# Patient Record
Sex: Female | Born: 1980 | Hispanic: No | Marital: Single | Smoking: Never smoker
Health system: Southern US, Community
[De-identification: ages and names within clinical notes are randomized; demographics above are authoritative.]

## PROBLEM LIST (undated history)

## (undated) ENCOUNTER — Inpatient Hospital Stay (HOSPITAL_COMMUNITY): Payer: Self-pay

## (undated) ENCOUNTER — Emergency Department (HOSPITAL_COMMUNITY): Admission: EM | Payer: Medicaid Other

## (undated) ENCOUNTER — Emergency Department (HOSPITAL_COMMUNITY): Admission: EM | Payer: Self-pay | Source: Home / Self Care

## (undated) ENCOUNTER — Ambulatory Visit (HOSPITAL_COMMUNITY): Disposition: A | Discharge status: Home / Self Care | Payer: Medicaid Other

## (undated) DIAGNOSIS — K219 Gastro-esophageal reflux disease without esophagitis: Secondary | ICD-10-CM

## (undated) DIAGNOSIS — N90810 Female genital mutilation status, unspecified: Secondary | ICD-10-CM

## (undated) DIAGNOSIS — K649 Unspecified hemorrhoids: Secondary | ICD-10-CM

## (undated) DIAGNOSIS — E7800 Pure hypercholesterolemia, unspecified: Secondary | ICD-10-CM

## (undated) DIAGNOSIS — Z9289 Personal history of other medical treatment: Secondary | ICD-10-CM

## (undated) HISTORY — DX: Unspecified hemorrhoids: K64.9

## (undated) HISTORY — DX: Personal history of other medical treatment: Z92.89

## (undated) HISTORY — DX: Female genital mutilation status, unspecified: N90.810

## (undated) HISTORY — PX: CYSTOTOMY: SHX926

---

## 2008-11-29 ENCOUNTER — Emergency Department (HOSPITAL_COMMUNITY): Admission: EM | Admit: 2008-11-29 | Discharge: 2008-11-29 | Payer: Self-pay | Admitting: Emergency Medicine

## 2009-04-05 ENCOUNTER — Emergency Department (HOSPITAL_COMMUNITY): Admission: EM | Admit: 2009-04-05 | Discharge: 2009-04-06 | Payer: Self-pay | Admitting: Emergency Medicine

## 2009-04-10 ENCOUNTER — Emergency Department (HOSPITAL_COMMUNITY): Admission: EM | Admit: 2009-04-10 | Discharge: 2009-04-10 | Payer: Self-pay | Admitting: Emergency Medicine

## 2009-05-19 ENCOUNTER — Emergency Department (HOSPITAL_COMMUNITY): Admission: EM | Admit: 2009-05-19 | Discharge: 2009-05-19 | Payer: Self-pay | Admitting: Emergency Medicine

## 2009-06-19 ENCOUNTER — Ambulatory Visit (HOSPITAL_COMMUNITY): Admission: RE | Admit: 2009-06-19 | Discharge: 2009-06-19 | Payer: Self-pay | Admitting: Chiropractic Medicine

## 2009-09-23 ENCOUNTER — Emergency Department (HOSPITAL_COMMUNITY): Admission: EM | Admit: 2009-09-23 | Discharge: 2009-09-23 | Payer: Self-pay | Admitting: Emergency Medicine

## 2009-11-03 ENCOUNTER — Inpatient Hospital Stay (HOSPITAL_COMMUNITY): Admission: AD | Admit: 2009-11-03 | Discharge: 2009-11-03 | Payer: Self-pay | Admitting: Obstetrics & Gynecology

## 2009-12-26 ENCOUNTER — Ambulatory Visit: Payer: Self-pay | Admitting: Obstetrics and Gynecology

## 2009-12-26 ENCOUNTER — Inpatient Hospital Stay (HOSPITAL_COMMUNITY): Admission: AD | Admit: 2009-12-26 | Discharge: 2009-12-26 | Payer: Self-pay | Admitting: Obstetrics and Gynecology

## 2009-12-28 ENCOUNTER — Encounter: Admission: RE | Admit: 2009-12-28 | Discharge: 2009-12-28 | Payer: Self-pay | Admitting: Family Medicine

## 2010-01-09 ENCOUNTER — Encounter: Payer: Self-pay | Admitting: Family Medicine

## 2010-01-09 ENCOUNTER — Ambulatory Visit (HOSPITAL_COMMUNITY): Admission: RE | Admit: 2010-01-09 | Discharge: 2010-01-09 | Payer: Self-pay | Admitting: Family Medicine

## 2010-02-15 ENCOUNTER — Inpatient Hospital Stay (HOSPITAL_COMMUNITY): Admission: AD | Admit: 2010-02-15 | Discharge: 2010-02-15 | Payer: Self-pay | Admitting: Obstetrics & Gynecology

## 2010-02-15 ENCOUNTER — Ambulatory Visit: Payer: Self-pay | Admitting: Physician Assistant

## 2010-03-24 ENCOUNTER — Encounter: Admission: RE | Admit: 2010-03-24 | Discharge: 2010-03-24 | Payer: Self-pay | Admitting: Infectious Diseases

## 2010-03-27 ENCOUNTER — Inpatient Hospital Stay (HOSPITAL_COMMUNITY): Admission: AD | Admit: 2010-03-27 | Discharge: 2010-03-27 | Payer: Self-pay | Admitting: Obstetrics & Gynecology

## 2010-03-27 ENCOUNTER — Ambulatory Visit: Payer: Self-pay | Admitting: Obstetrics and Gynecology

## 2010-05-16 ENCOUNTER — Ambulatory Visit: Payer: Self-pay | Admitting: Family Medicine

## 2010-05-16 ENCOUNTER — Inpatient Hospital Stay (HOSPITAL_COMMUNITY): Admission: AD | Admit: 2010-05-16 | Discharge: 2010-05-16 | Payer: Self-pay | Admitting: Obstetrics & Gynecology

## 2010-06-01 ENCOUNTER — Inpatient Hospital Stay (HOSPITAL_COMMUNITY): Admission: AD | Admit: 2010-06-01 | Discharge: 2010-06-01 | Payer: Self-pay | Admitting: Obstetrics and Gynecology

## 2010-06-01 ENCOUNTER — Ambulatory Visit: Payer: Self-pay | Admitting: Advanced Practice Midwife

## 2010-06-09 ENCOUNTER — Inpatient Hospital Stay (HOSPITAL_COMMUNITY): Admission: RE | Admit: 2010-06-09 | Discharge: 2010-06-13 | Payer: Self-pay | Admitting: Obstetrics & Gynecology

## 2010-06-09 ENCOUNTER — Ambulatory Visit: Payer: Self-pay | Admitting: Obstetrics and Gynecology

## 2010-06-09 ENCOUNTER — Ambulatory Visit: Payer: Self-pay | Admitting: Obstetrics & Gynecology

## 2010-06-10 ENCOUNTER — Encounter: Payer: Self-pay | Admitting: Obstetrics & Gynecology

## 2010-06-13 ENCOUNTER — Inpatient Hospital Stay (HOSPITAL_COMMUNITY): Admission: AD | Admit: 2010-06-13 | Discharge: 2010-06-14 | Payer: Self-pay | Admitting: Obstetrics & Gynecology

## 2010-06-13 ENCOUNTER — Ambulatory Visit: Payer: Self-pay | Admitting: Family Medicine

## 2010-06-19 ENCOUNTER — Ambulatory Visit: Payer: Self-pay | Admitting: Obstetrics & Gynecology

## 2010-06-23 ENCOUNTER — Ambulatory Visit: Admission: RE | Admit: 2010-06-23 | Discharge: 2010-06-23 | Payer: Self-pay | Admitting: Obstetrics & Gynecology

## 2010-06-23 ENCOUNTER — Inpatient Hospital Stay (HOSPITAL_COMMUNITY): Admission: AD | Admit: 2010-06-23 | Discharge: 2010-06-23 | Payer: Self-pay | Admitting: Obstetrics & Gynecology

## 2010-07-10 ENCOUNTER — Ambulatory Visit: Payer: Self-pay | Admitting: Obstetrics & Gynecology

## 2010-08-28 ENCOUNTER — Emergency Department (HOSPITAL_COMMUNITY)
Admission: EM | Admit: 2010-08-28 | Discharge: 2010-08-28 | Payer: Self-pay | Source: Home / Self Care | Admitting: Emergency Medicine

## 2010-11-02 ENCOUNTER — Inpatient Hospital Stay (INDEPENDENT_AMBULATORY_CARE_PROVIDER_SITE_OTHER)
Admission: RE | Admit: 2010-11-02 | Discharge: 2010-11-02 | Disposition: A | Discharge status: Home / Self Care | Payer: Self-pay | Source: Ambulatory Visit | Attending: Emergency Medicine | Admitting: Emergency Medicine

## 2010-11-02 DIAGNOSIS — M549 Dorsalgia, unspecified: Secondary | ICD-10-CM

## 2010-11-30 LAB — CBC
HCT: 31.3 % — ABNORMAL LOW (ref 36.0–46.0)
HCT: 35.4 % — ABNORMAL LOW (ref 36.0–46.0)
Hemoglobin: 10.5 g/dL — ABNORMAL LOW (ref 12.0–15.0)
Hemoglobin: 11.9 g/dL — ABNORMAL LOW (ref 12.0–15.0)
MCHC: 33.5 g/dL (ref 30.0–36.0)
MCHC: 33.9 g/dL (ref 30.0–36.0)
MCV: 93 fL (ref 78.0–100.0)
MCV: 93.1 fL (ref 78.0–100.0)
Platelets: 319 10*3/uL (ref 150–400)
RDW: 14.4 % (ref 11.5–15.5)

## 2010-11-30 LAB — COMPREHENSIVE METABOLIC PANEL
BUN: 4 mg/dL — ABNORMAL LOW (ref 6–23)
Chloride: 107 mEq/L (ref 96–112)
Creatinine, Ser: 0.63 mg/dL (ref 0.4–1.2)
GFR calc Af Amer: >60 mL/min (ref >60)
GFR calc non Af Amer: >60 mL/min (ref >60)
Potassium: 3.8 mEq/L (ref 3.5–5.1)
Sodium: 137 mEq/L (ref 135–145)
Total Protein: 5.6 g/dL — ABNORMAL LOW (ref 6.0–8.3)

## 2010-11-30 LAB — URINALYSIS, ROUTINE W REFLEX MICROSCOPIC
Bilirubin Urine: NEGATIVE
Glucose, UA: NEGATIVE mg/dL
Ketones, ur: NEGATIVE mg/dL
Nitrite: NEGATIVE
Protein, ur: NEGATIVE mg/dL
Urobilinogen, UA: 0.2 mg/dL (ref 0.0–1.0)

## 2010-11-30 LAB — RPR: RPR Ser Ql: NONREACTIVE

## 2010-12-01 LAB — WET PREP, GENITAL: Clue Cells Wet Prep HPF POC: NONE SEEN

## 2010-12-03 LAB — URINALYSIS, ROUTINE W REFLEX MICROSCOPIC
Bilirubin Urine: NEGATIVE
Bilirubin Urine: NEGATIVE
Glucose, UA: NEGATIVE mg/dL
Hgb urine dipstick: NEGATIVE
Ketones, ur: NEGATIVE mg/dL
Nitrite: NEGATIVE
Nitrite: NEGATIVE
Protein, ur: NEGATIVE mg/dL
Protein, ur: 30 mg/dL — AB
Specific Gravity, Urine: 1.01 (ref 1.005–1.030)
Urobilinogen, UA: 0.2 mg/dL (ref 0.0–1.0)
Urobilinogen, UA: 0.2 mg/dL (ref 0.0–1.0)
pH: 5.5 (ref 5.0–8.0)

## 2010-12-03 LAB — URINE MICROSCOPIC-ADD ON

## 2010-12-04 LAB — URINALYSIS, ROUTINE W REFLEX MICROSCOPIC
Glucose, UA: NEGATIVE mg/dL
Nitrite: NEGATIVE
Urobilinogen, UA: 0.2 mg/dL (ref 0.0–1.0)

## 2010-12-04 LAB — URINE MICROSCOPIC-ADD ON

## 2010-12-06 LAB — URINALYSIS, ROUTINE W REFLEX MICROSCOPIC
Bilirubin Urine: NEGATIVE
Glucose, UA: NEGATIVE mg/dL
Ketones, ur: NEGATIVE mg/dL
Protein, ur: NEGATIVE mg/dL
pH: 7.5 (ref 5.0–8.0)

## 2010-12-06 LAB — WET PREP, GENITAL
Clue Cells Wet Prep HPF POC: NONE SEEN
Trich, Wet Prep: NONE SEEN

## 2010-12-06 LAB — GC/CHLAMYDIA PROBE AMP, GENITAL
Chlamydia, DNA Probe: NEGATIVE
GC Probe Amp, Genital: NEGATIVE

## 2010-12-07 LAB — COMPREHENSIVE METABOLIC PANEL
BUN: 2 mg/dL — ABNORMAL LOW (ref 6–23)
CO2: 23 mEq/L (ref 19–32)
Calcium: 9.1 mg/dL (ref 8.4–10.5)
Chloride: 103 mEq/L (ref 96–112)
Creatinine, Ser: 0.52 mg/dL (ref 0.4–1.2)
GFR calc Af Amer: >60 mL/min (ref >60)
GFR calc non Af Amer: >60 mL/min (ref >60)
Total Bilirubin: 0.8 mg/dL (ref 0.3–1.2)

## 2010-12-07 LAB — URINALYSIS, ROUTINE W REFLEX MICROSCOPIC
Bilirubin Urine: NEGATIVE
Ketones, ur: NEGATIVE mg/dL
Nitrite: NEGATIVE
Specific Gravity, Urine: <1.005 — ABNORMAL LOW (ref 1.005–1.030)
Urobilinogen, UA: 0.2 mg/dL (ref 0.0–1.0)

## 2010-12-07 LAB — CBC
HCT: 36.5 % (ref 36.0–46.0)
MCHC: 33.8 g/dL (ref 30.0–36.0)
MCV: 93.6 fL (ref 78.0–100.0)
Platelets: 293 10*3/uL (ref 150–400)
RBC: 3.9 MIL/uL (ref 3.87–5.11)
WBC: 6.3 10*3/uL (ref 4.0–10.5)

## 2010-12-22 LAB — URINALYSIS, ROUTINE W REFLEX MICROSCOPIC
Nitrite: NEGATIVE
Protein, ur: NEGATIVE mg/dL
Urobilinogen, UA: 0.2 mg/dL (ref 0.0–1.0)

## 2010-12-22 LAB — POCT PREGNANCY, URINE: Preg Test, Ur: NEGATIVE

## 2010-12-22 LAB — URINE CULTURE
Colony Count: NO GROWTH
Culture: NO GROWTH

## 2010-12-22 LAB — URINE MICROSCOPIC-ADD ON

## 2010-12-24 LAB — DIFFERENTIAL
Basophils Relative: 1 % (ref 0–1)
Lymphocytes Relative: 34 % (ref 12–46)
Lymphs Abs: 2.1 10*3/uL (ref 0.7–4.0)
Monocytes Relative: 12 % (ref 3–12)
Neutro Abs: 3.1 10*3/uL (ref 1.7–7.7)
Neutrophils Relative %: 51 % (ref 43–77)

## 2010-12-24 LAB — URINE CULTURE

## 2010-12-24 LAB — URINALYSIS, ROUTINE W REFLEX MICROSCOPIC
Glucose, UA: NEGATIVE mg/dL
Leukocytes, UA: NEGATIVE
Protein, ur: NEGATIVE mg/dL
Specific Gravity, Urine: 1.009 (ref 1.005–1.030)
Urobilinogen, UA: 0.2 mg/dL (ref 0.0–1.0)

## 2010-12-24 LAB — POCT I-STAT, CHEM 8
BUN: 13 mg/dL (ref 6–23)
Chloride: 104 mEq/L (ref 96–112)
Sodium: 139 mEq/L (ref 135–145)

## 2010-12-24 LAB — CBC
MCHC: 33.8 g/dL (ref 30.0–36.0)
RBC: 4.24 MIL/uL (ref 3.87–5.11)
WBC: 6.2 10*3/uL (ref 4.0–10.5)

## 2010-12-24 LAB — URINE MICROSCOPIC-ADD ON

## 2011-02-20 ENCOUNTER — Ambulatory Visit (INDEPENDENT_AMBULATORY_CARE_PROVIDER_SITE_OTHER): Payer: Medicaid Other

## 2011-02-20 ENCOUNTER — Inpatient Hospital Stay (INDEPENDENT_AMBULATORY_CARE_PROVIDER_SITE_OTHER)
Admission: RE | Admit: 2011-02-20 | Discharge: 2011-02-20 | Disposition: A | Discharge status: Home / Self Care | Payer: Medicaid Other | Source: Ambulatory Visit | Attending: Emergency Medicine | Admitting: Emergency Medicine

## 2011-02-20 DIAGNOSIS — R071 Chest pain on breathing: Secondary | ICD-10-CM

## 2011-02-20 DIAGNOSIS — T7840XA Allergy, unspecified, initial encounter: Secondary | ICD-10-CM

## 2011-02-20 LAB — POCT URINALYSIS DIP (DEVICE)
Bilirubin Urine: NEGATIVE
Glucose, UA: NEGATIVE mg/dL
Hgb urine dipstick: NEGATIVE
Specific Gravity, Urine: 1.02 (ref 1.005–1.030)

## 2011-04-14 ENCOUNTER — Emergency Department (HOSPITAL_COMMUNITY)
Admission: EM | Admit: 2011-04-14 | Discharge: 2011-04-14 | Disposition: A | Discharge status: Home / Self Care | Payer: Medicaid Other | Attending: Emergency Medicine | Admitting: Emergency Medicine

## 2011-04-14 DIAGNOSIS — IMO0001 Reserved for inherently not codable concepts without codable children: Secondary | ICD-10-CM | POA: Insufficient documentation

## 2011-04-14 DIAGNOSIS — J3489 Other specified disorders of nose and nasal sinuses: Secondary | ICD-10-CM | POA: Insufficient documentation

## 2011-04-14 DIAGNOSIS — R5381 Other malaise: Secondary | ICD-10-CM | POA: Insufficient documentation

## 2011-04-14 DIAGNOSIS — R5383 Other fatigue: Secondary | ICD-10-CM | POA: Insufficient documentation

## 2011-04-14 DIAGNOSIS — H9209 Otalgia, unspecified ear: Secondary | ICD-10-CM | POA: Insufficient documentation

## 2011-04-14 DIAGNOSIS — R599 Enlarged lymph nodes, unspecified: Secondary | ICD-10-CM | POA: Insufficient documentation

## 2011-04-14 DIAGNOSIS — J020 Streptococcal pharyngitis: Secondary | ICD-10-CM | POA: Insufficient documentation

## 2011-04-14 DIAGNOSIS — R110 Nausea: Secondary | ICD-10-CM | POA: Insufficient documentation

## 2011-04-14 DIAGNOSIS — R51 Headache: Secondary | ICD-10-CM | POA: Insufficient documentation

## 2011-04-14 LAB — RAPID STREP SCREEN (MED CTR MEBANE ONLY): Streptococcus, Group A Screen (Direct): POSITIVE — AB

## 2011-06-09 ENCOUNTER — Emergency Department (HOSPITAL_COMMUNITY)
Admission: EM | Admit: 2011-06-09 | Discharge: 2011-06-09 | Disposition: A | Discharge status: Home / Self Care | Payer: Medicaid Other | Attending: Emergency Medicine | Admitting: Emergency Medicine

## 2011-06-09 DIAGNOSIS — IMO0001 Reserved for inherently not codable concepts without codable children: Secondary | ICD-10-CM | POA: Insufficient documentation

## 2011-06-09 DIAGNOSIS — B9789 Other viral agents as the cause of diseases classified elsewhere: Secondary | ICD-10-CM | POA: Insufficient documentation

## 2011-06-09 DIAGNOSIS — K219 Gastro-esophageal reflux disease without esophagitis: Secondary | ICD-10-CM | POA: Insufficient documentation

## 2011-06-09 LAB — COMPREHENSIVE METABOLIC PANEL
Alkaline Phosphatase: 141 U/L — ABNORMAL HIGH (ref 39–117)
BUN: 10 mg/dL (ref 6–23)
Chloride: 104 mEq/L (ref 96–112)
GFR calc Af Amer: >60 mL/min (ref >60)
GFR calc non Af Amer: >60 mL/min (ref >60)
Glucose, Bld: 95 mg/dL (ref 70–99)
Potassium: 3.9 mEq/L (ref 3.5–5.1)
Total Bilirubin: 0.3 mg/dL (ref 0.3–1.2)
Total Protein: 7.5 g/dL (ref 6.0–8.3)

## 2011-06-09 LAB — CBC
Hemoglobin: 13 g/dL (ref 12.0–15.0)
MCH: 30.1 pg (ref 26.0–34.0)
MCHC: 34.4 g/dL (ref 30.0–36.0)

## 2011-06-09 LAB — URINALYSIS, ROUTINE W REFLEX MICROSCOPIC
Bilirubin Urine: NEGATIVE
Ketones, ur: NEGATIVE mg/dL
Nitrite: NEGATIVE
pH: 6 (ref 5.0–8.0)

## 2011-06-09 LAB — DIFFERENTIAL
Basophils Relative: 1 % (ref 0–1)
Eosinophils Absolute: 0.1 10*3/uL (ref 0.0–0.7)
Monocytes Absolute: 0.5 10*3/uL (ref 0.1–1.0)
Monocytes Relative: 8 % (ref 3–12)
Neutro Abs: 3.3 10*3/uL (ref 1.7–7.7)

## 2011-06-09 LAB — POCT PREGNANCY, URINE: Preg Test, Ur: NEGATIVE

## 2011-06-09 LAB — LIPASE, BLOOD: Lipase: 32 U/L (ref 11–59)

## 2011-07-18 ENCOUNTER — Inpatient Hospital Stay (INDEPENDENT_AMBULATORY_CARE_PROVIDER_SITE_OTHER)
Admission: RE | Admit: 2011-07-18 | Discharge: 2011-07-18 | Disposition: A | Discharge status: Home / Self Care | Payer: Medicaid Other | Source: Ambulatory Visit | Attending: Emergency Medicine | Admitting: Emergency Medicine

## 2011-07-18 DIAGNOSIS — K219 Gastro-esophageal reflux disease without esophagitis: Secondary | ICD-10-CM

## 2011-07-18 DIAGNOSIS — M545 Low back pain: Secondary | ICD-10-CM

## 2011-07-18 DIAGNOSIS — K602 Anal fissure, unspecified: Secondary | ICD-10-CM

## 2011-11-29 ENCOUNTER — Encounter (HOSPITAL_COMMUNITY): Payer: Self-pay | Admitting: *Deleted

## 2011-11-29 ENCOUNTER — Emergency Department (HOSPITAL_COMMUNITY)
Admission: EM | Admit: 2011-11-29 | Discharge: 2011-11-29 | Disposition: A | Discharge status: Home / Self Care | Payer: Medicaid Other | Source: Home / Self Care | Attending: Family Medicine | Admitting: Family Medicine

## 2011-11-29 DIAGNOSIS — IMO0002 Reserved for concepts with insufficient information to code with codable children: Secondary | ICD-10-CM

## 2011-11-29 DIAGNOSIS — S90829A Blister (nonthermal), unspecified foot, initial encounter: Secondary | ICD-10-CM

## 2011-11-29 HISTORY — DX: Gastro-esophageal reflux disease without esophagitis: K21.9

## 2011-11-29 MED ORDER — ALUMINUM ACETATE EX SOLN: CUTANEOUS | Status: DC

## 2011-11-29 NOTE — Discharge Instructions (Signed)
Your blood sugar test was normal. You need to wear comfortable shoes at work. She is need to be well cushioned and wide. Use the prescribed solution to mix with water and soak your feet 1 or twice a day  until blisters disappear. Do not breakdown blisters yourself as it can open the door for infection. Return if your toes are turning red swelling more tender or with drainage.

## 2011-11-29 NOTE — ED Notes (Signed)
States started new job 10 days ago and standing on hard floor all day/all shift and now both 4th and 5th toes are "pink" and painful.  States wearing tennis shoes for new job.

## 2011-11-29 NOTE — ED Provider Notes (Signed)
History     CSN: [id number]  Arrival date & time 11/29/11  7829   First MD Initiated Contact with Patient 11/29/11 1900      Chief Complaint  Patient presents with  . Toe Pain    (Consider location/radiation/quality/duration/timing/severity/associated sxs/prior treatment) HPI Comments: 31 y/o female no significant PMH. Here c/ bilateral sole pain and blisters in 4th and 5th toes of both feet. 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 She wears close tennis shoes at work. Her feet have been swollen for 10 days. Pt. States diabetes runs in her family. Patient random CBG: WNL here today.   Past Medical History  Diagnosis Date  . Acid reflux     Past Surgical History  Procedure Date  . Cesarean section     Family History  Problem Relation Age of Onset  . Diabetes Mother   . Diabetes Sister   . Diabetes Brother     History  Substance Use Topics  . Smoking status: Never Smoker   . Smokeless tobacco: Not on file  . Alcohol Use: No    OB History    Grav Para Term Preterm Abortions TAB SAB Ect Mult Living   1 1              Review of Systems  Constitutional: Negative.   Skin:       As per HPI.  All other systems reviewed and are negative.    Allergies  Review of patient's allergies indicates no known allergies.  Home Medications   Current Outpatient Rx  Name Route Sig Dispense Refill  . ALUMINUM ACETATE EX SOLN  Soak both feet daily for 15 min. Let dry well after before putting shoes. 480 mL 0    BP 122/84  Pulse 64  Temp(Src) 98.4 F (36.9 C) (Oral)  Resp 16  SpO2 100%  LMP 11/06/2011  Physical Exam  Nursing note and vitals reviewed. Constitutional: She is oriented to person, place, and time. She appears well-developed and well-nourished. No distress.  Cardiovascular: Normal heart sounds.   Pulmonary/Chest: Breath sounds  normal.  Neurological: She is alert and oriented to person, place, and time.  Skin:       Feet: Pt has wide feet and is wearing open sandals today. There are friction blisters in the pads of 4th and 5th toes in both feet. There is mild erythema and swelling associated at 4th and 5th distal phalanges bilaterally. Appears clear fluid inside the blisters. No drainage, ulcers or hematomas. No maceration between toes or signs of tine pedis.    ED Course  Procedures (including critical care time)  Labs Reviewed  GLUCOSE, CAPILLARY - Abnormal; Notable for the following:    Glucose-Capillary 106 (*)    All other components within normal limits  LAB REPORT - SCANNED   No results found.   1. Friction blisters of the soles       MDM  Normal random CBG. Reccommended aluminum acetate solution soaks and avoid rupturing blisters. Use wide well cushioned and ventilated shoes.         Sharin Grave, MD 12/01/11 1153

## 2012-02-21 ENCOUNTER — Other Ambulatory Visit: Payer: Self-pay | Admitting: Gastroenterology

## 2012-05-03 ENCOUNTER — Encounter (HOSPITAL_COMMUNITY): Payer: Self-pay

## 2012-05-03 ENCOUNTER — Inpatient Hospital Stay (HOSPITAL_COMMUNITY)
Admission: AD | Admit: 2012-05-03 | Discharge: 2012-05-04 | Disposition: A | Discharge status: Home / Self Care | Payer: Medicaid Other | Source: Ambulatory Visit | Attending: Obstetrics & Gynecology | Admitting: Obstetrics & Gynecology

## 2012-05-03 DIAGNOSIS — R109 Unspecified abdominal pain: Secondary | ICD-10-CM | POA: Insufficient documentation

## 2012-05-03 DIAGNOSIS — O99891 Other specified diseases and conditions complicating pregnancy: Secondary | ICD-10-CM | POA: Insufficient documentation

## 2012-05-03 DIAGNOSIS — O210 Mild hyperemesis gravidarum: Secondary | ICD-10-CM | POA: Insufficient documentation

## 2012-05-03 DIAGNOSIS — K219 Gastro-esophageal reflux disease without esophagitis: Secondary | ICD-10-CM

## 2012-05-03 DIAGNOSIS — O219 Vomiting of pregnancy, unspecified: Secondary | ICD-10-CM

## 2012-05-03 LAB — WET PREP, GENITAL
Clue Cells Wet Prep HPF POC: NONE SEEN
Trich, Wet Prep: NONE SEEN

## 2012-05-03 LAB — URINALYSIS, ROUTINE W REFLEX MICROSCOPIC
Glucose, UA: NEGATIVE mg/dL
Leukocytes, UA: NEGATIVE
Nitrite: NEGATIVE
Specific Gravity, Urine: 1.025 (ref 1.005–1.030)
pH: 5.5 (ref 5.0–8.0)

## 2012-05-03 LAB — URINE MICROSCOPIC-ADD ON

## 2012-05-03 LAB — POCT PREGNANCY, URINE: Preg Test, Ur: POSITIVE — AB

## 2012-05-03 MED ORDER — PROMETHAZINE HCL 12.5 MG PO TABS: 12.5000 mg | ORAL_TABLET | Freq: Four times a day (QID) | ORAL | Status: DC | PRN

## 2012-05-03 MED ORDER — PRENATAL PLUS 27-1 MG PO TABS: 1.0000 | ORAL_TABLET | Freq: Every day | ORAL | Status: DC

## 2012-05-03 NOTE — MAU Note (Signed)
Pt states, " I took a home pregnancy test and it was positive and I throw up every time I eat something for the past week. I am having some constipation too.I've had pain in my low abdomen off and on for a couple of days."

## 2012-05-03 NOTE — MAU Provider Note (Signed)
Michaela Moran y.o.G2P1001 @[redacted]w[redacted]d  by LMP Chief Complaint  Patient presents with  . Abdominal Pain  . Vaginal Itching  . Emesis During Pregnancy     First Provider Initiated Contact with Patient 05/03/12 2318      SUBJECTIVE  HPI: Her main concern is acid reflux which she has had before pregnancy and is unresponsive to Tums. In addition she has constant nausea without vomiting and vaginal discharge that is pruritic. She requests PNV Rx and needs pregnancy verification letter. No abdominal pain or vaginal bleeding.   Past Medical History  Diagnosis Date  . Acid reflux    Past Surgical History  Procedure Date  . Cesarean section    History   Social History  . Marital Status: Married    Spouse Name: N/A    Number of Children: N/A  . Years of Education: N/A   Occupational History  . Not on file.   Social History Main Topics  . Smoking status: Never Smoker   . Smokeless tobacco: Not on file  . Alcohol Use: No  . Drug Use: No  . Sexually Active: Yes   Other Topics Concern  . Not on file   Social History Narrative  . No narrative on file   No current facility-administered medications on file prior to encounter.   No current outpatient prescriptions on file prior to encounter.   No Known Allergies  ROS: Pertinent items in HPI  OBJECTIVE Last menstrual period 03/31/2012.  GENERAL: Well-developed, well-nourished female in no acute distress.  HEENT: Normocephalic, good dentition HEART: normal rate RESP: normal effort ABDOMEN: Soft, nontender EXTREMITIES: Nontender, no edema NEURO: Alert and oriented SPECULUM EXAM: NEFG, mucusy white discharge, no blood noted, cervix clean BIMANUAL: cervix L/C, mobile; uterus NT, ULNS; no adnexal tenderness or masses   LAB RESULTS  Results for orders placed during the hospital encounter of 05/03/12 (from the past 24 hour(s))  URINALYSIS, ROUTINE W REFLEX MICROSCOPIC     Status: Abnormal   Collection Time   05/03/12  7:57 PM    Component Value Range   Color, Urine YELLOW  YELLOW   APPearance CLEAR  CLEAR   Specific Gravity, Urine 1.025  1.005 - 1.030   pH 5.5  5.0 - 8.0   Glucose, UA NEGATIVE  NEGATIVE mg/dL   Hgb urine dipstick TRACE (*) NEGATIVE   Bilirubin Urine NEGATIVE  NEGATIVE   Ketones, ur NEGATIVE  NEGATIVE mg/dL   Protein, ur NEGATIVE  NEGATIVE mg/dL   Urobilinogen, UA 0.2  0.0 - 1.0 mg/dL   Nitrite NEGATIVE  NEGATIVE   Leukocytes, UA NEGATIVE  NEGATIVE  URINE MICROSCOPIC-ADD ON     Status: Abnormal   Collection Time   05/03/12  7:57 PM      Component Value Range   Squamous Epithelial / LPF FEW (*) RARE   WBC, UA 0-2  <3 WBC/hpf   RBC / HPF 0-2  <3 RBC/hpf   Bacteria, UA FEW (*) RARE   Urine-Other MUCOUS PRESENT    POCT PREGNANCY, URINE     Status: Abnormal   Collection Time   05/03/12  8:12 PM      Component Value Range   Preg Test, Ur POSITIVE (*) NEGATIVE  WET PREP, GENITAL     Status: Abnormal   Collection Time   05/03/12 11:23 PM      Component Value Range   Yeast Wet Prep HPF POC NONE SEEN  NONE SEEN   Trich, Wet Prep NONE SEEN  NONE SEEN  Clue Cells Wet Prep HPF POC NONE SEEN  NONE SEEN   WBC, Wet Prep HPF POC FEW (*) NONE SEEN       ASSESSMENT  1. GERD (gastroesophageal reflux disease)   Nausea and vomiting of pregnancy G2P1001 at [redacted]w[redacted]d   PLAN Urine C&S Rx PNV  Rx Phenergan 12.5 mg  1-2 po q4-6h prn N/V Rx Prilosec-OTC 1 po qd acbreakfast; GERD diet reviewed Pregnancy precautions and list of care providers      Yang Rack 05/03/2012 11:26 PM

## 2012-05-05 LAB — URINE CULTURE

## 2012-05-06 LAB — GC/CHLAMYDIA PROBE AMP, GENITAL: GC Probe Amp, Genital: NEGATIVE

## 2012-06-16 ENCOUNTER — Other Ambulatory Visit (HOSPITAL_COMMUNITY): Payer: Self-pay | Admitting: Family

## 2012-06-16 DIAGNOSIS — Z3682 Encounter for antenatal screening for nuchal translucency: Secondary | ICD-10-CM

## 2012-06-16 LAB — OB RESULTS CONSOLE ABO/RH: RH Type: POSITIVE

## 2012-06-16 LAB — OB RESULTS CONSOLE ANTIBODY SCREEN: Antibody Screen: NEGATIVE

## 2012-06-16 LAB — OB RESULTS CONSOLE RPR: RPR: NONREACTIVE

## 2012-06-16 LAB — OB RESULTS CONSOLE HIV ANTIBODY (ROUTINE TESTING): HIV: NONREACTIVE

## 2012-06-25 ENCOUNTER — Ambulatory Visit (HOSPITAL_COMMUNITY): Payer: Medicaid Other

## 2012-07-01 ENCOUNTER — Other Ambulatory Visit (HOSPITAL_COMMUNITY): Payer: Medicaid Other

## 2012-07-03 ENCOUNTER — Ambulatory Visit (HOSPITAL_COMMUNITY)
Admission: RE | Admit: 2012-07-03 | Discharge: 2012-07-03 | Disposition: A | Discharge status: Home / Self Care | Payer: Medicaid Other | Source: Ambulatory Visit | Attending: Family | Admitting: Family

## 2012-07-03 ENCOUNTER — Ambulatory Visit (HOSPITAL_COMMUNITY): Admission: RE | Admit: 2012-07-03 | Payer: Medicaid Other | Source: Ambulatory Visit

## 2012-07-03 ENCOUNTER — Other Ambulatory Visit (HOSPITAL_COMMUNITY): Payer: Self-pay | Admitting: Family

## 2012-07-03 DIAGNOSIS — Z36 Encounter for antenatal screening of mother: Secondary | ICD-10-CM | POA: Insufficient documentation

## 2012-07-03 DIAGNOSIS — Z3687 Encounter for antenatal screening for uncertain dates: Secondary | ICD-10-CM

## 2012-07-03 DIAGNOSIS — Z3682 Encounter for antenatal screening for nuchal translucency: Secondary | ICD-10-CM

## 2012-07-03 NOTE — Progress Notes (Signed)
Michaela Moran  was seen today for an ultrasound appointment.  See full report in [facility].  Alpha Gula, MD  Single IUP at 15 5/7 weeks Limited views of the fetal anatomy obtained due to early gestational age Unable to perform first trimester screen (outside of gestational age window) Normal amniotic fluid volume  Recommend quad screen at 15-[redacted] weeks gestation. Ultrasound for fetal anatomy at 18 weeks.

## 2012-07-11 ENCOUNTER — Inpatient Hospital Stay (HOSPITAL_COMMUNITY)
Admission: AD | Admit: 2012-07-11 | Discharge: 2012-07-12 | Disposition: A | Discharge status: Home / Self Care | Payer: Medicaid Other | Source: Ambulatory Visit | Attending: Obstetrics & Gynecology | Admitting: Obstetrics & Gynecology

## 2012-07-11 ENCOUNTER — Encounter (HOSPITAL_COMMUNITY): Payer: Self-pay | Admitting: *Deleted

## 2012-07-11 DIAGNOSIS — R252 Cramp and spasm: Secondary | ICD-10-CM

## 2012-07-11 DIAGNOSIS — M2550 Pain in unspecified joint: Secondary | ICD-10-CM | POA: Insufficient documentation

## 2012-07-11 DIAGNOSIS — O26899 Other specified pregnancy related conditions, unspecified trimester: Secondary | ICD-10-CM

## 2012-07-11 DIAGNOSIS — R300 Dysuria: Secondary | ICD-10-CM | POA: Insufficient documentation

## 2012-07-11 DIAGNOSIS — O99891 Other specified diseases and conditions complicating pregnancy: Secondary | ICD-10-CM | POA: Insufficient documentation

## 2012-07-11 DIAGNOSIS — M79609 Pain in unspecified limb: Secondary | ICD-10-CM

## 2012-07-11 MED ORDER — IBUPROFEN 600 MG PO TABS
600.0000 mg | ORAL_TABLET | Freq: Once | ORAL | Status: AC
  Administered 2012-07-11: 600 mg via ORAL
  Filled 2012-07-11: qty 1

## 2012-07-11 NOTE — MAU Note (Signed)
Has worked for 6 days. Yesterday had pain L knee. When gets cold, joints in knees and elbows hurt. Took tylenol and helped pain. Manager told her to come to hospital and check out everything. When walks sometimes feels like muscle in leg pulls

## 2012-07-11 NOTE — Progress Notes (Signed)
Has pain if touches L knee on inner side. Works in [employer] and stands alot

## 2012-07-11 NOTE — MAU Note (Signed)
Pt now states wants urine checked. Sometimes has abd pain. Urine collected and sent

## 2012-07-11 NOTE — Progress Notes (Signed)
Wants Vit B level to be checked.

## 2012-07-12 ENCOUNTER — Encounter (HOSPITAL_COMMUNITY): Payer: Self-pay | Admitting: Advanced Practice Midwife

## 2012-07-12 DIAGNOSIS — R252 Cramp and spasm: Secondary | ICD-10-CM

## 2012-07-12 DIAGNOSIS — O26899 Other specified pregnancy related conditions, unspecified trimester: Secondary | ICD-10-CM

## 2012-07-12 LAB — URINALYSIS, ROUTINE W REFLEX MICROSCOPIC
Bilirubin Urine: NEGATIVE
Glucose, UA: NEGATIVE mg/dL
Leukocytes, UA: NEGATIVE
Nitrite: NEGATIVE
Specific Gravity, Urine: <1.005 — ABNORMAL LOW (ref 1.005–1.030)
pH: 6 (ref 5.0–8.0)

## 2012-07-12 MED ORDER — MEDICAL COMPRESSION STOCKINGS MISC: 1.0000 | Freq: Every day | Status: DC

## 2012-07-12 MED ORDER — IBUPROFEN 600 MG PO TABS: 600.0000 mg | ORAL_TABLET | Freq: Four times a day (QID) | ORAL | Status: DC | PRN

## 2012-07-12 NOTE — Progress Notes (Signed)
Ivonne Andrew CNM in earlier discussing d/c plan. Written and verbal d./c instructions given and understanding voiced

## 2012-07-12 NOTE — MAU Provider Note (Signed)
Chief Complaint: Joint Pain   First Provider Initiated Contact with Patient 07/12/12 0039     SUBJECTIVE HPI: Michaela Moran is a 31 y.o. G2P1001 at [redacted]w[redacted]d by LMP who presents with 1. Pain L knee, worse yesterday.  2. Joints in knees and elbows hurt.  3. Sudden tightening of calf muscles, especially at night. 4. Requesting to have urine checked for infection due to mild dysuria.  5. Requesting to have vitamin D level checked.   Pain relieved w/ tylenol. Manager told her to come to hospital for eval. Denies fever, chills, frequency, urgency, calf swelling, calf pain except for cramps, injury. [facility]    Past Medical History  Diagnosis Date  . Acid reflux    OB History    Grav Para Term Preterm Abortions TAB SAB Ect Mult Living   2 1 1       1      # Outc Date GA Lbr Len/2nd Wgt Sex Del Anes PTL Lv   1 TRM      CS   Yes   Comments: NRFHTs   2 CUR              Past Surgical History  Procedure Date  . Cesarean section    History   Social History  . Marital Status: Married    Spouse Name: N/A    Number of Children: N/A  . Years of Education: N/A   Occupational History  . Not on file.   Social History Main Topics  . Smoking status: Never Smoker   . Smokeless tobacco: Not on file  . Alcohol Use: No  . Drug Use: No  . Sexually Active: Yes   Other Topics Concern  . Not on file   Social History Narrative  . No narrative on file   No current facility-administered medications on file prior to encounter.   Current Outpatient Prescriptions on File Prior to Encounter  Medication Sig Dispense Refill  . prenatal vitamin w/FE, FA (PRENATAL 1 + 1) 27-1 MG TABS Take 1 tablet by mouth daily.  30 each  0  . promethazine (PHENERGAN) 12.5 MG tablet Take 1 tablet (12.5 mg total) by mouth every 6 (six) hours as needed for nausea.  30 tablet  0   No Known Allergies  ROS: Pertinent items in HPI  OBJECTIVE Blood pressure 121/63, pulse 73, temperature 97.8 F  (36.6 C), temperature source Oral, resp. rate 20, height 5\' 6"  (1.676 m), weight 76.386 kg (168 lb 6.4 oz), last menstrual period 03/31/2012. GENERAL: Well-developed, well-nourished female in no acute distress.  HEENT: Normocephalic HEART: normal rate RESP: normal effort ABDOMEN: Soft, non-tender EXTREMITIES: Left inner aspect of knee tender. No edema, bruising. Normal ROM. Neg Homan's. No calf swelling, cords or tenderness.  NEURO: Alert and oriented SPECULUM EXAM: deferred FHR 153 by doppler  LAB RESULTS Results for orders placed during the hospital encounter of 07/11/12 (from the past 24 hour(s))  URINALYSIS, ROUTINE W REFLEX MICROSCOPIC     Status: Abnormal   Collection Time   07/11/12 11:43 PM      Component Value Range   Color, Urine YELLOW  YELLOW   APPearance CLEAR  CLEAR   Specific Gravity, Urine <1.005 (*) 1.005 - 1.030   pH 6.0  5.0 - 8.0   Glucose, UA NEGATIVE  NEGATIVE mg/dL   Hgb urine dipstick NEGATIVE  NEGATIVE   Bilirubin Urine NEGATIVE  NEGATIVE   Ketones, ur NEGATIVE  NEGATIVE mg/dL  Protein, ur NEGATIVE  NEGATIVE mg/dL   Urobilinogen, UA 0.2  0.0 - 1.0 mg/dL   Nitrite NEGATIVE  NEGATIVE   Leukocytes, UA NEGATIVE  NEGATIVE    IMAGING   MAU COURSE Pain resolved w/ Ibuprofen.  ASSESSMENT 1. Leg cramps in pregnancy   2. Musculoskeletal limb pain   3. Dysuria in pregnancy    PLAN Discharge home Comfort measures Reassurance given Request Vitamin D check at next appt. Follow-up Information    Follow up with [facility] [place]. On 07/14/2012.   Contact information:   [address] [place] [address] [phone]      Follow up with [facility] [place] [facility]. (As needed if symptoms worsen)    Contact information:   [address] [id number] mc [place] [address] [phone]          Medication List     As of 07/12/2012  2:49 AM    TAKE these medications         ibuprofen  600 MG tablet   Commonly known as: ADVIL,MOTRIN   Take 1 tablet (600 mg total) by mouth every 6 (six) hours as needed for pain. Do not take after 28th week of pregnancy.      Medical Compression Stockings Misc   1 Device by Does not apply route daily.      prenatal vitamin w/FE, FA 27-1 MG Tabs   Take 1 tablet by mouth daily.      promethazine 25 MG tablet   Commonly known as: PHENERGAN   Take 25 mg by mouth every 6 (six) hours as needed.        [place], CNM 07/12/2012  12:48 AM

## 2012-07-14 ENCOUNTER — Other Ambulatory Visit (HOSPITAL_COMMUNITY): Payer: Self-pay | Admitting: Family

## 2012-07-14 DIAGNOSIS — Z1389 Encounter for screening for other disorder: Secondary | ICD-10-CM

## 2012-07-21 ENCOUNTER — Ambulatory Visit (HOSPITAL_COMMUNITY)
Admission: RE | Admit: 2012-07-21 | Discharge: 2012-07-21 | Disposition: A | Discharge status: Home / Self Care | Payer: Medicaid Other | Source: Ambulatory Visit | Attending: Family | Admitting: Family

## 2012-07-21 DIAGNOSIS — Z1389 Encounter for screening for other disorder: Secondary | ICD-10-CM

## 2012-07-21 DIAGNOSIS — O358XX0 Maternal care for other (suspected) fetal abnormality and damage, not applicable or unspecified: Secondary | ICD-10-CM | POA: Insufficient documentation

## 2012-07-21 DIAGNOSIS — Z363 Encounter for antenatal screening for malformations: Secondary | ICD-10-CM | POA: Insufficient documentation

## 2012-08-14 ENCOUNTER — Inpatient Hospital Stay (HOSPITAL_COMMUNITY)
Admission: AD | Admit: 2012-08-14 | Discharge: 2012-08-14 | Disposition: A | Discharge status: Home / Self Care | Payer: Medicaid Other | Source: Ambulatory Visit | Attending: Obstetrics and Gynecology | Admitting: Obstetrics and Gynecology

## 2012-08-14 ENCOUNTER — Encounter (HOSPITAL_COMMUNITY): Payer: Self-pay | Admitting: *Deleted

## 2012-08-14 DIAGNOSIS — O99891 Other specified diseases and conditions complicating pregnancy: Secondary | ICD-10-CM | POA: Insufficient documentation

## 2012-08-14 DIAGNOSIS — R42 Dizziness and giddiness: Secondary | ICD-10-CM | POA: Insufficient documentation

## 2012-08-14 LAB — URINALYSIS, ROUTINE W REFLEX MICROSCOPIC
Bilirubin Urine: NEGATIVE
Hgb urine dipstick: NEGATIVE
Nitrite: NEGATIVE
Specific Gravity, Urine: <1.005 — ABNORMAL LOW (ref 1.005–1.030)
pH: 6 (ref 5.0–8.0)

## 2012-08-14 LAB — GLUCOSE, CAPILLARY: Glucose-Capillary: 97 mg/dL (ref 70–99)

## 2012-08-14 NOTE — MAU Note (Signed)
Pt ambulatory with assist or difficulty. Sitting in bed playing a game on cellphone.

## 2012-08-14 NOTE — MAU Provider Note (Signed)
History     CSN: [id number]  Arrival date and time: 08/14/12 0032   None     Chief Complaint  Patient presents with  . Dizziness   HPI Michaela Moran is 31 y.o. G2P1001 [redacted]w[redacted]d weeks presenting with episode of dizziness just prior to arrival, while at work.  She states she had blurred vision and felt shaky.  Denies syncope.  She receives her prenatal care at [facility].  Husband interpreted.  States that his wife reports the episode as brief and has not repeated.  She is here to "make sure the baby is all right".  She denies blurred vision, dizziness, weakness or headache at this time.      Past Medical History  Diagnosis Date  . Acid reflux     Past Surgical History  Procedure Date  . Cesarean section     Family History  Problem Relation Age of Onset  . Diabetes Mother   . Diabetes Sister   . Diabetes Brother   . Other Neg Hx     History  Substance Use Topics  . Smoking status: Never Smoker   . Smokeless tobacco: Not on file  . Alcohol Use: No    Allergies: No Known Allergies  Prescriptions prior to admission  Medication Sig Dispense Refill  . Elastic Bandages & Supports (MEDICAL COMPRESSION STOCKINGS) MISC 1 Device by Does not apply route daily.  1 each  2  . ibuprofen (ADVIL,MOTRIN) 600 MG tablet Take 1 tablet (600 mg total) by mouth every 6 (six) hours as needed for pain. Do not take after 28th week of pregnancy.  30 tablet  1  . prenatal vitamin w/FE, FA (PRENATAL 1 + 1) 27-1 MG TABS Take 1 tablet by mouth daily.  30 each  0  . promethazine (PHENERGAN) 25 MG tablet Take 25 mg by mouth every 6 (six) hours as needed.        Review of Systems  Constitutional: Negative.   HENT: Negative.   Eyes: Positive for blurred vision.  Gastrointestinal: Negative.   Genitourinary: Negative.   Neurological: Positive for dizziness. Negative for headaches.   Physical Exam   Blood pressure 116/68, pulse 78, temperature 97.8 F (36.6 C), temperature source Oral, resp. rate 18,  height 5\' 6"  (1.676 m), weight 171 lb (77.565 kg), last menstrual period 03/31/2012, SpO2 100.00%. FHR 140.   Physical Exam  Constitutional: She is oriented to person, place, and time. She appears well-developed and well-nourished. No distress.  HENT:  Head: Normocephalic.  Neck: Normal range of motion.  Cardiovascular: Normal rate.   Respiratory: Effort normal.  Genitourinary:       Not indicated  Neurological: She is alert and oriented to person, place, and time.  Skin: Skin is warm and dry.  Psychiatric: She has a normal mood and affect. Her behavior is normal.    Results for orders placed during the hospital encounter of 08/14/12 (from the past 24 hour(s))  URINALYSIS, ROUTINE W REFLEX MICROSCOPIC     Status: Abnormal   Collection Time   08/14/12 12:38 AM      Component Value Range   Color, Urine YELLOW  YELLOW   APPearance CLEAR  CLEAR   Specific Gravity, Urine <1.005 (*) 1.005 - 1.030   pH 6.0  5.0 - 8.0   Glucose, UA NEGATIVE  NEGATIVE mg/dL   Hgb urine dipstick NEGATIVE  NEGATIVE   Bilirubin Urine NEGATIVE  NEGATIVE   Ketones, ur NEGATIVE  NEGATIVE mg/dL   Protein, ur NEGATIVE  NEGATIVE mg/dL   Urobilinogen, UA 0.2  0.0 - 1.0 mg/dL   Nitrite NEGATIVE  NEGATIVE   Leukocytes, UA NEGATIVE  NEGATIVE  GLUCOSE, CAPILLARY     Status: Normal   Collection Time   08/14/12  1:05 AM      Component Value Range   Glucose-Capillary 97  70 - 99 mg/dL   MAU Course  Procedures  MDM   Assessment and Plan  A:   Single episode of dizziness at work       [redacted]w[redacted]d gestation      + Fetal heart tone at 140 by Doppler  P:  Stressed importance of eating regularly, staying well hydrated       Keep appointment with [facility] to continue prenatal care  Marcelene Weidemann,EVE M 08/14/2012, 2:00 AM

## 2012-08-14 NOTE — MAU Note (Signed)
Pt states she was working and she felt dizzy and felt like she was shaking and she had blurred vision. This all happened at 2315 tonight.

## 2012-08-16 NOTE — MAU Provider Note (Signed)
Attestation of Attending Supervision of Advanced Practitioner: Evaluation and management procedures were performed by the PA/NP/CNM/OB Fellow under my supervision/collaboration. Chart reviewed and agree with management and plan.  Devondre Guzzetta V 08/16/2012 7:04 PM    

## 2012-09-01 ENCOUNTER — Encounter (HOSPITAL_COMMUNITY): Payer: Self-pay | Admitting: Advanced Practice Midwife

## 2012-09-01 ENCOUNTER — Inpatient Hospital Stay (HOSPITAL_COMMUNITY)
Admission: AD | Admit: 2012-09-01 | Discharge: 2012-09-01 | Disposition: A | Discharge status: Hospice | Payer: Medicaid Other | Source: Ambulatory Visit | Attending: Obstetrics & Gynecology | Admitting: Obstetrics & Gynecology

## 2012-09-01 DIAGNOSIS — R109 Unspecified abdominal pain: Secondary | ICD-10-CM | POA: Insufficient documentation

## 2012-09-01 DIAGNOSIS — O99891 Other specified diseases and conditions complicating pregnancy: Secondary | ICD-10-CM | POA: Insufficient documentation

## 2012-09-01 DIAGNOSIS — M545 Low back pain, unspecified: Secondary | ICD-10-CM | POA: Insufficient documentation

## 2012-09-01 DIAGNOSIS — N949 Unspecified condition associated with female genital organs and menstrual cycle: Secondary | ICD-10-CM

## 2012-09-01 LAB — URINALYSIS, ROUTINE W REFLEX MICROSCOPIC
Glucose, UA: NEGATIVE mg/dL
Ketones, ur: NEGATIVE mg/dL
Leukocytes, UA: NEGATIVE
Nitrite: NEGATIVE
Protein, ur: NEGATIVE mg/dL

## 2012-09-01 MED ORDER — ACETAMINOPHEN 325 MG PO TABS: 650.0000 mg | ORAL_TABLET | Freq: Four times a day (QID) | ORAL | Status: DC | PRN

## 2012-09-01 MED ORDER — ACETAMINOPHEN 325 MG PO TABS
650.0000 mg | ORAL_TABLET | Freq: Once | ORAL | Status: AC
  Administered 2012-09-01: 650 mg via ORAL
  Filled 2012-09-01: qty 2

## 2012-09-01 NOTE — MAU Provider Note (Signed)
History     CSN: [id number]  Arrival date and time: 09/01/12 1428   First Provider Initiated Contact with Patient 09/01/12 1614      Chief Complaint  Patient presents with  . Abdominal Pain  . Back Pain   HPI: Pt is a 31 y.o. G2P1001 at [redacted]w[redacted]d who presents with complaint of back pain since yesterday. Pt speaks Somali and husband assists in translation. The pain is in her low back and some on her left side that she "feels when the baby moves." She has not taken any medicine for the pain and states she hasn't had the pain before. She and her husband report stressors at home and husband states that she works a lot and "lets things bother her," and that she needs a doctor to tell her to "let things go, sometimes."  Pt gets prenatal care at [facility] and reports no complications. However, she is interested in changing care to a doctor because she "only sees nurses" at the HD. She "wants to deliver here [at [facility]]," and she "wants to be seen by a doctor," because her last baby was a c-section.  OB History    Grav Para Term Preterm Abortions TAB SAB Ect Mult Living   2 1 1       1       Past Medical History  Diagnosis Date  . Acid reflux     Past Surgical History  Procedure Date  . Cesarean section     Family History  Problem Relation Age of Onset  . Diabetes Mother   . Diabetes Sister   . Diabetes Brother   . Other Neg Hx     History  Substance Use Topics  . Smoking status: Never Smoker   . Smokeless tobacco: Not on file  . Alcohol Use: No    Allergies: No Known Allergies  Prescriptions prior to admission  Medication Sig Dispense Refill  . promethazine (PHENERGAN) 25 MG tablet Take 25 mg by mouth every 6 (six) hours as needed. nausea      . Elastic Bandages & Supports (MEDICAL COMPRESSION STOCKINGS) MISC 1 Device by Does not apply route daily.  1 each  2    ROS: See HPI  Physical Exam   Blood pressure 93/59, pulse 94, temperature 97.5 F (36.4 C), temperature  source Oral, resp. rate 16, height 5\' 6"  (1.676 m), weight 76.295 kg (168 lb 3.2 oz), last menstrual period 03/31/2012.  Physical Exam  Constitutional: She is oriented to person, place, and time. She appears well-developed and well-nourished. No distress.  HENT:  Head: Normocephalic and atraumatic.  Eyes: Conjunctivae normal are normal. Pupils are equal, round, and reactive to light.  Neck: Normal range of motion. Neck supple.  Cardiovascular: Normal rate, regular rhythm and normal heart sounds.   No murmur heard. Respiratory: Effort normal and breath sounds normal. She has no wheezes.  GI: Soft. Bowel sounds are normal. She exhibits no distension. There is no tenderness.  Musculoskeletal: Normal range of motion. She exhibits no edema.       Lumbar back: She exhibits tenderness. She exhibits no edema and no deformity.       Diffuse low back and paraspinal tenderness but pt able to move/sit up in bed/stand/walk easily without assistance.  Neurological: She is alert and oriented to person, place, and time.  Skin: Skin is warm and dry.  Psychiatric: She has a normal mood and affect. Her behavior is normal.    MAU Course  Procedures -  fFN collected but not sent -digital cervical exam  MDM: FHR tracing reassuring. Cervix very posterior and closed on digital exam.  Assessment and Plan  31 y.o. G2P1001 at [redacted]w[redacted]d -SIUP, back pain in pregnancy likely round ligament pain related to work/physical activity  -advised Tylenol for pain and importance of rest  -provided pt with list of providers to possibly transfer care away from [place] [facility]  -advised pt to either keep upcoming appt at [facility] on 12/20 or to call and inform them of this MAU visit and desire to change  Above discussed with D. Dalisha Shively, CNM  Street, Christopher 09/01/2012, 5:01 PM   Evaluation and management procedures were performed by Resident physician under my supervision/collaboration. Chart reviewed, patient examined by me and I agree  with management and plan.  Danae Orleans, CNM 09/01/2012 8:15 PM

## 2012-09-01 NOTE — MAU Note (Signed)
Pt says she started with the low back pain since yesterday. Denies any urinary symptoms and has not taken any medication for the pain.

## 2012-09-01 NOTE — MAU Provider Note (Signed)
Attestation of Attending Supervision of Advanced Practitioner (CNM/NP): Evaluation and management procedures were performed by the Advanced Practitioner under my supervision and collaboration.  I have reviewed the Advanced Practitioner's note and chart, and I agree with the management and plan.  HARRAWAY-SMITH, Doloros Kwolek 8:54 PM

## 2012-09-01 NOTE — MAU Note (Signed)
Lower abd & back pain since yesterday, feels tightness on L side, feels as if baby is moving too much on L side.  Denies bleeding or LOF.

## 2012-10-01 ENCOUNTER — Inpatient Hospital Stay (HOSPITAL_COMMUNITY)
Admission: AD | Admit: 2012-10-01 | Discharge: 2012-10-01 | Disposition: A | Discharge status: Home / Self Care | Payer: Medicaid Other | Source: Ambulatory Visit | Attending: Obstetrics & Gynecology | Admitting: Obstetrics & Gynecology

## 2012-10-01 ENCOUNTER — Encounter (HOSPITAL_COMMUNITY): Payer: Self-pay | Admitting: *Deleted

## 2012-10-01 DIAGNOSIS — R109 Unspecified abdominal pain: Secondary | ICD-10-CM | POA: Insufficient documentation

## 2012-10-01 DIAGNOSIS — J111 Influenza due to unidentified influenza virus with other respiratory manifestations: Secondary | ICD-10-CM

## 2012-10-01 DIAGNOSIS — R509 Fever, unspecified: Secondary | ICD-10-CM | POA: Insufficient documentation

## 2012-10-01 DIAGNOSIS — B9789 Other viral agents as the cause of diseases classified elsewhere: Secondary | ICD-10-CM | POA: Insufficient documentation

## 2012-10-01 DIAGNOSIS — O99891 Other specified diseases and conditions complicating pregnancy: Secondary | ICD-10-CM | POA: Insufficient documentation

## 2012-10-01 DIAGNOSIS — R51 Headache: Secondary | ICD-10-CM

## 2012-10-01 MED ORDER — ACETAMINOPHEN 325 MG PO TABS
650.0000 mg | ORAL_TABLET | Freq: Four times a day (QID) | ORAL | Status: DC | PRN
  Administered 2012-10-01: 650 mg via ORAL
  Filled 2012-10-01: qty 2

## 2012-10-01 MED ORDER — OSELTAMIVIR PHOSPHATE 75 MG PO CAPS: 75.0000 mg | ORAL_CAPSULE | Freq: Two times a day (BID) | ORAL | Status: DC

## 2012-10-01 NOTE — MAU Provider Note (Signed)
History    Michaela Moran is a 32 y.o. G2P1001 at [redacted]w[redacted]d who presents to the MAU due to generalized body aches, subjective fever, headache, and lower abdominal pain. Her symptoms began last night, and she has had to stay in bed all day because of acute tiredness. She has not tried any remedies, and has not worsened, though "all I want to do is stay in bed". She denies cough and sore throat to me, but did report sore throat to nursing. She has not measured any fevers, but has had subjective fevers and chills.  Her appetite is decreased for both foods and liquids. She did receive an influenza vaccine, and has no known sick contacts, however, she works in Engineering geologist.  She denies LOF, bleeding, continues to feel fetal movement.  CSN: [id number]  Arrival date and time: 10/01/12 1758   None     Chief Complaint  Patient presents with  . Sore Throat   Sore Throat  This is a new problem. The current episode started yesterday. The problem has been unchanged. The pain is moderate. Associated symptoms include headaches and shortness of breath. Pertinent negatives include no coughing, diarrhea or vomiting. She has tried nothing for the symptoms.    OB History    Grav Para Term Preterm Abortions TAB SAB Ect Mult Living   2 1 1       1       Past Medical History  Diagnosis Date  . Acid reflux     Past Surgical History  Procedure Date  . Cesarean section     Family History  Problem Relation Age of Onset  . Diabetes Mother   . Diabetes Sister   . Diabetes Brother   . Other Neg Hx     History  Substance Use Topics  . Smoking status: Never Smoker   . Smokeless tobacco: Not on file  . Alcohol Use: No    Allergies: No Known Allergies  Prescriptions prior to admission  Medication Sig Dispense Refill  . promethazine (PHENERGAN) 25 MG tablet Take 25 mg by mouth every 6 (six) hours as needed. nausea      . Elastic Bandages & Supports (MEDICAL COMPRESSION STOCKINGS) MISC 1 Device by Does not apply  route daily.  1 each  2    Review of Systems  Constitutional: Positive for chills.       Subjective fever  Eyes: Negative for blurred vision and double vision.  Respiratory: Positive for shortness of breath. Negative for cough.   Cardiovascular: Negative for chest pain.  Gastrointestinal: Positive for heartburn and nausea. Negative for vomiting and diarrhea.  Musculoskeletal: Positive for myalgias. Negative for joint pain.  Neurological: Positive for headaches. Negative for dizziness and focal weakness.   Physical Exam   Last menstrual period 03/31/2012.  Physical Exam  Constitutional: She appears well-developed and well-nourished. No distress.  HENT:  Head: Normocephalic.  Mouth/Throat: Oropharynx is clear and moist.  Eyes: Conjunctivae normal and EOM are normal. Pupils are equal, round, and reactive to light.  Neck: Normal range of motion. Neck supple.  Cardiovascular: Normal rate, regular rhythm, normal heart sounds and intact distal pulses.   No murmur heard. Respiratory: Effort normal and breath sounds normal. No respiratory distress. She has no wheezes. She has no rales.  GI: Soft. Bowel sounds are normal. There is no tenderness.  Musculoskeletal: She exhibits no edema and no tenderness.  Lymphadenopathy:    She has no cervical adenopathy.  Neurological: She is alert.  Skin: Skin  is warm and dry.  Psychiatric: She has a normal mood and affect.   Toco: no contractions seen FHR: 140, + accel, no decels  MAU Course  Procedures  Rapid flu pending Assessment and Plan  32 y.o. G2P1001 at [redacted]w[redacted]d presenting for one day of generalized fatigue, subjective fever, and abdominal pain 1) Symptoms consistent with acute viral illness-- no fever at this visit, however, given her symptoms, will treat empirically with Tamiflu, and monitor for rapid flu results 2) Abdominal pain consistent with round ligament pain. Given information on supportive devices   I have seen and examined  this patient and agree the above assessment. CRESENZO-DISHMAN,Michaela Moran 10/02/2012 1:24 AM   CONROY, LOUISA 10/01/2012, 7:29 PM

## 2012-10-01 NOTE — MAU Note (Signed)
Pt states she started feeling sick last night pt was not able to sleep.Pt  Complains of sore throat headache and fatigue.

## 2012-10-02 LAB — INFLUENZA PANEL BY PCR (TYPE A & B): Influenza A By PCR: NEGATIVE

## 2012-10-08 ENCOUNTER — Inpatient Hospital Stay (HOSPITAL_COMMUNITY)
Admission: AD | Admit: 2012-10-08 | Discharge: 2012-10-08 | Disposition: A | Discharge status: Home / Self Care | Payer: Medicaid Other | Source: Ambulatory Visit | Attending: Obstetrics & Gynecology | Admitting: Obstetrics & Gynecology

## 2012-10-08 ENCOUNTER — Encounter (HOSPITAL_COMMUNITY): Payer: Self-pay

## 2012-10-08 ENCOUNTER — Inpatient Hospital Stay (HOSPITAL_COMMUNITY): Payer: Medicaid Other

## 2012-10-08 DIAGNOSIS — R109 Unspecified abdominal pain: Secondary | ICD-10-CM | POA: Insufficient documentation

## 2012-10-08 DIAGNOSIS — O26899 Other specified pregnancy related conditions, unspecified trimester: Secondary | ICD-10-CM

## 2012-10-08 DIAGNOSIS — O4690 Antepartum hemorrhage, unspecified, unspecified trimester: Secondary | ICD-10-CM

## 2012-10-08 LAB — URINALYSIS, ROUTINE W REFLEX MICROSCOPIC
Bilirubin Urine: NEGATIVE
Glucose, UA: NEGATIVE mg/dL
Hgb urine dipstick: NEGATIVE
Ketones, ur: NEGATIVE mg/dL
Protein, ur: NEGATIVE mg/dL
pH: 7 (ref 5.0–8.0)

## 2012-10-08 LAB — WET PREP, GENITAL: Trich, Wet Prep: NONE SEEN

## 2012-10-08 NOTE — MAU Provider Note (Signed)
History   [name] is a 32 y.o. G2P1001 at [redacted]w[redacted]d who presents to the MAU due to vaginal bleeding and cramping since 5:00 this afternoon. She was at work, walking around, when she suddenly was stopped due to excessive lower abdominal cramping. She went to the bathroom and noted that the toilet filled with blood  "like a period", and she has since felt possible leakage of fluid. She does continue to feel good fetal movement, and has no other complaints.  [facility] and has been seen frequently in the MAU. Review of her ultrasounds indicate no placenta previa.    CSN: [id number]  Arrival date and time: 10/08/12 1850   First Provider Initiated Contact with Patient 10/08/12 2142      Chief Complaint  Patient presents with  . Vaginal Bleeding   HPI First child was C/S secondary to fetal bradycardia  OB History    Grav Para Term Preterm Abortions TAB SAB Ect Mult Living   2 1 1       1       Past Medical History  Diagnosis Date  . Acid reflux     Past Surgical History  Procedure Date  . Cesarean section     Family History  Problem Relation Age of Onset  . Diabetes Mother   . Diabetes Sister   . Diabetes Brother   . Other Neg Hx     History  Substance Use Topics  . Smoking status: Never Smoker   . Smokeless tobacco: Not on file  . Alcohol Use: No    Allergies: No Known Allergies  Prescriptions prior to admission  Medication Sig Dispense Refill  . Prenatal Vit-Fe Fumarate-FA (MULTIVITAMIN-PRENATAL) 27-0.8 MG TABS Take 1 tablet by mouth daily.      . promethazine (PHENERGAN) 25 MG tablet Take 25 mg by mouth every 6 (six) hours as needed. nausea      . Elastic Bandages & Supports (MEDICAL COMPRESSION STOCKINGS) MISC 1 Device by Does not apply route daily.  1 each  2  . oseltamivir (TAMIFLU) 75 MG capsule Take 1 capsule (75 mg total) by mouth 2 (two) times daily.  10 capsule  0    Review of Systems  Constitutional: Negative for fever  and chills.  Eyes: Negative for blurred vision.  Respiratory: Negative for cough.   Cardiovascular: Negative for chest pain.  Gastrointestinal: Positive for abdominal pain. Negative for nausea, vomiting, diarrhea and constipation.  Genitourinary: Negative for dysuria.  Neurological: Negative for dizziness and headaches.   Physical Exam   Blood pressure 100/65, pulse 102, temperature 98.4 F (36.9 C), temperature source Oral, resp. rate 20, height 5\' 5"  (1.651 m), weight 79.379 kg (175 lb), last menstrual period 03/31/2012, SpO2 100.00%.  Physical Exam  Constitutional: She is oriented to person, place, and time. She appears well-developed and well-nourished. Distressed: uncomfortable and appropriated concerned.  HENT:  Head: Normocephalic.  Eyes: Pupils are equal, round, and reactive to light.  Neck: Normal range of motion. Neck supple.  Cardiovascular: Normal rate, regular rhythm, normal heart sounds and intact distal pulses.   No murmur heard. Respiratory: Effort normal and breath sounds normal. She has no wheezes.  GI: There is no tenderness.       gravid  Genitourinary: Vagina normal. Vaginal discharge: no blood visualized.       Female circumcision  Musculoskeletal: Normal range of motion. She exhibits no edema.  Neurological: She is alert and oriented to person, place, and  time.  Skin: Skin is warm and dry.  Psychiatric: She has a normal mood and affect.   FHR: 130, +accels, no decels Toco: no contractions noted  MAU Course  Procedures Results for orders placed during the hospital encounter of 10/08/12 (from the past 24 hour(s))  URINALYSIS, ROUTINE W REFLEX MICROSCOPIC     Status: Abnormal   Collection Time   10/08/12  7:10 PM      Component Value Range   Color, Urine YELLOW  YELLOW   APPearance CLEAR  CLEAR   Specific Gravity, Urine <1.005 (*) 1.005 - 1.030   pH 7.0  5.0 - 8.0   Glucose, UA NEGATIVE  NEGATIVE mg/dL   Hgb urine dipstick NEGATIVE  NEGATIVE    Bilirubin Urine NEGATIVE  NEGATIVE   Ketones, ur NEGATIVE  NEGATIVE mg/dL   Protein, ur NEGATIVE  NEGATIVE mg/dL   Urobilinogen, UA 0.2  0.0 - 1.0 mg/dL   Nitrite NEGATIVE  NEGATIVE   Leukocytes, UA NEGATIVE  NEGATIVE  AMNISURE RUPTURE OF MEMBRANE (ROM)     Status: Normal   Collection Time   10/08/12 10:07 PM      Component Value Range   Amnisure ROM NEGATIVE    WET PREP, GENITAL     Status: Abnormal   Collection Time   10/08/12 10:07 PM      Component Value Range   Yeast Wet Prep HPF POC NONE SEEN  NONE SEEN   Trich, Wet Prep NONE SEEN  NONE SEEN   Clue Cells Wet Prep HPF POC FEW (*) NONE SEEN   WBC, Wet Prep HPF POC FEW (*) NONE SEEN  ' Amnisure was negative US demonstrated 3.5 cm cervix and no placenta previa  Assessment and Plan  32 y.o. G2P1001 at [redacted]w[redacted]d who presents for vaginal bleeding and cramping. 1. Vaginal bleeding in pregnancy   2. Abdominal cramping complicating pregnancy    No bleeding was noted on exam, amnisure was negative, and ultrasound and fetal tracing were reassuring.  Rh positive blood type Discussed with Dr. Despina Hidden, who agrees that patient is safe to discharge home. Instructions given for when to return to MAU, and [facility].     Bonnita Nasuti 10/08/2012, 9:45 PM   I have seen this patient and agree with the above resident's note.  LEFTWICH-[name], LISA Certified Nurse-Midwife

## 2012-10-08 NOTE — MAU Note (Signed)
Pt reports she went to the restroom this pm and had a lot of pressure and then had some bleeding.  States painful urination.

## 2012-11-29 LAB — OB RESULTS CONSOLE GBS: GBS: NEGATIVE

## 2012-12-22 ENCOUNTER — Other Ambulatory Visit (HOSPITAL_COMMUNITY): Payer: Self-pay | Admitting: Family

## 2012-12-22 DIAGNOSIS — O480 Post-term pregnancy: Secondary | ICD-10-CM

## 2012-12-23 ENCOUNTER — Encounter (HOSPITAL_COMMUNITY): Payer: Self-pay | Admitting: *Deleted

## 2012-12-23 ENCOUNTER — Telehealth (HOSPITAL_COMMUNITY): Payer: Self-pay | Admitting: *Deleted

## 2012-12-23 NOTE — Telephone Encounter (Signed)
Preadmission screen [phone] interpreter number

## 2012-12-26 ENCOUNTER — Ambulatory Visit (HOSPITAL_COMMUNITY)
Admission: RE | Admit: 2012-12-26 | Discharge: 2012-12-26 | Disposition: A | Discharge status: Home / Self Care | Payer: Medicaid Other | Source: Ambulatory Visit | Attending: Family | Admitting: Family

## 2012-12-26 DIAGNOSIS — O480 Post-term pregnancy: Secondary | ICD-10-CM | POA: Insufficient documentation

## 2012-12-26 DIAGNOSIS — Z3689 Encounter for other specified antenatal screening: Secondary | ICD-10-CM | POA: Insufficient documentation

## 2012-12-29 ENCOUNTER — Other Ambulatory Visit (HOSPITAL_COMMUNITY): Payer: Self-pay | Admitting: Physician Assistant

## 2012-12-29 DIAGNOSIS — O480 Post-term pregnancy: Secondary | ICD-10-CM

## 2012-12-30 ENCOUNTER — Inpatient Hospital Stay (HOSPITAL_COMMUNITY): Admission: RE | Admit: 2012-12-30 | Payer: Medicaid Other | Source: Ambulatory Visit

## 2012-12-31 ENCOUNTER — Encounter (HOSPITAL_COMMUNITY): Payer: Self-pay

## 2012-12-31 ENCOUNTER — Inpatient Hospital Stay (HOSPITAL_COMMUNITY)
Admission: RE | Admit: 2012-12-31 | Discharge: 2013-01-05 | DRG: 765 | Disposition: A | Discharge status: Home / Self Care | Payer: Medicaid Other | Source: Ambulatory Visit | Attending: Obstetrics & Gynecology | Admitting: Obstetrics & Gynecology

## 2012-12-31 VITALS — BP 112/80 | HR 85 | Temp 98.2°F | Resp 20 | Ht 66.0 in | Wt 170.0 lb

## 2012-12-31 DIAGNOSIS — O480 Post-term pregnancy: Secondary | ICD-10-CM | POA: Diagnosis present

## 2012-12-31 DIAGNOSIS — Z98891 History of uterine scar from previous surgery: Secondary | ICD-10-CM

## 2012-12-31 DIAGNOSIS — O34219 Maternal care for unspecified type scar from previous cesarean delivery: Secondary | ICD-10-CM | POA: Diagnosis present

## 2012-12-31 DIAGNOSIS — O99892 Other specified diseases and conditions complicating childbirth: Secondary | ICD-10-CM | POA: Diagnosis present

## 2012-12-31 DIAGNOSIS — N90810 Female genital mutilation status, unspecified: Secondary | ICD-10-CM | POA: Diagnosis present

## 2012-12-31 DIAGNOSIS — O324XX0 Maternal care for high head at term, not applicable or unspecified: Secondary | ICD-10-CM | POA: Diagnosis present

## 2012-12-31 LAB — CBC
HCT: 33.3 % — ABNORMAL LOW (ref 36.0–46.0)
Hemoglobin: 11.2 g/dL — ABNORMAL LOW (ref 12.0–15.0)
MCHC: 33.6 g/dL (ref 30.0–36.0)
RBC: 3.71 MIL/uL — ABNORMAL LOW (ref 3.87–5.11)
WBC: 9.3 10*3/uL (ref 4.0–10.5)

## 2012-12-31 MED ORDER — ACETAMINOPHEN 325 MG PO TABS
650.0000 mg | ORAL_TABLET | ORAL | Status: DC | PRN
  Administered 2013-01-02: 650 mg via ORAL

## 2012-12-31 MED ORDER — IBUPROFEN 600 MG PO TABS: 600.0000 mg | ORAL_TABLET | Freq: Four times a day (QID) | ORAL | Status: DC | PRN

## 2012-12-31 MED ORDER — TERBUTALINE SULFATE 1 MG/ML IJ SOLN: 0.2500 mg | Freq: Once | INTRAMUSCULAR | Status: AC | PRN

## 2012-12-31 MED ORDER — ONDANSETRON HCL 4 MG/2ML IJ SOLN: 4.0000 mg | Freq: Four times a day (QID) | INTRAMUSCULAR | Status: DC | PRN

## 2012-12-31 MED ORDER — OXYTOCIN BOLUS FROM INFUSION: 500.0000 mL | INTRAVENOUS | Status: DC

## 2012-12-31 MED ORDER — LACTATED RINGERS IV SOLN
500.0000 mL | INTRAVENOUS | Status: DC | PRN
  Administered 2013-01-01: 500 mL via INTRAVENOUS
  Administered 2013-01-01: 1000 mL via INTRAVENOUS
  Administered 2013-01-02 (×2): 500 mL via INTRAVENOUS

## 2012-12-31 MED ORDER — LACTATED RINGERS IV SOLN
INTRAVENOUS | Status: DC
  Administered 2012-12-31 – 2013-01-01 (×3): via INTRAVENOUS
  Administered 2013-01-01: 125 mL/h via INTRAVENOUS
  Administered 2013-01-01 – 2013-01-02 (×4): via INTRAVENOUS

## 2012-12-31 MED ORDER — LIDOCAINE HCL (PF) 1 % IJ SOLN
30.0000 mL | INTRAMUSCULAR | Status: DC | PRN
  Filled 2012-12-31: qty 30

## 2012-12-31 MED ORDER — CITRIC ACID-SODIUM CITRATE 334-500 MG/5ML PO SOLN
30.0000 mL | ORAL | Status: DC | PRN
  Administered 2013-01-02: 30 mL via ORAL
  Filled 2012-12-31: qty 15

## 2012-12-31 MED ORDER — OXYTOCIN 40 UNITS IN LACTATED RINGERS INFUSION - SIMPLE MED: 62.5000 mL/h | INTRAVENOUS | Status: DC

## 2012-12-31 MED ORDER — OXYCODONE-ACETAMINOPHEN 5-325 MG PO TABS: 1.0000 | ORAL_TABLET | ORAL | Status: DC | PRN

## 2012-12-31 MED ORDER — OXYTOCIN 40 UNITS IN LACTATED RINGERS INFUSION - SIMPLE MED
1.0000 m[IU]/min | INTRAVENOUS | Status: DC
  Administered 2012-12-31: 1 m[IU]/min via INTRAVENOUS
  Administered 2012-12-31: 2 m[IU]/min via INTRAVENOUS
  Administered 2013-01-01: 4 m[IU]/min via INTRAVENOUS
  Administered 2013-01-01: 5 m[IU]/min via INTRAVENOUS
  Administered 2013-01-01: 3 m[IU]/min via INTRAVENOUS
  Filled 2012-12-31: qty 1000

## 2012-12-31 NOTE — H&P (Signed)
Michaela Moran is a 32 y.o. G2P1001 at [redacted]w[redacted]d who presents for induction of labor secondary to post-dates. Has a hx of prior cesarean section in 2011 for non reassuring fetal heart tracing. Desires TOLAC. Consent signed and on chart.  Denies fluid leaking or bleeding. Started feeling contractions around 12 noon today. Is feeling baby move. Endorses some shortness of breath.  History OB History   Grav Para Term Preterm Abortions TAB SAB Ect Mult Living   2 1 1       1      Past Medical History  Diagnosis Date  . Acid reflux   . Hemorrhoid   . 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 Female circumcision    Past Surgical History  Procedure Laterality Date  . Cesarean section     Family History: family history includes Diabetes in her brother, mother, and sister and Hypertension in her brother.  There is no history of Other.  Social History: denies alcohol, tobacco, or drug use. From [place].  Prenatal Transfer Tool  Maternal Diabetes: No Genetic Screening: Normal Maternal Ultrasounds/Referrals: Normal Fetal Ultrasounds or other Referrals:  None Maternal Substance Abuse:  No Significant Maternal Medications:  Meds include: Other:  Significant Maternal Lab Results:  Lab values include: Group B Strep negative Other Comments:  [place], on phenergan and omeprazole, hx of female circumcision, + PPD with negative CXR  ROS no fluid leak or bleeding. + contractions. + fetal movement  Blood pressure 99/69, pulse 80, resp. rate 20, height 5\' 6"  (1.676 m), weight 170 lb (77.111 kg), last menstrual period 03/31/2012. O2 sat 100% on RA Exam Physical Exam  Gen: NAD Heart: RRR Lungs: CTAB, NWOB Abd: gravid but otherwise soft, nontender to palpation Ext: no appreciable lower extremity edema bilaterally Neuro: grossly nonfocal, speech intact GU: surgical absence of clitoris. Otherwise normal external genitalia Dilation: 1 Effacement (%): Thick Station: -3 Presentation: Vertex (via u/s) Exam  by:: Thressa Sheller, CNM   Prenatal labs: ABO, Rh: AB/Positive/-- (09/30 0000) Antibody: Negative (09/30 0000) Rubella: Immune (09/30 0000) RPR: Nonreactive (09/30 0000)  HBsAg: Negative (09/30 0000)  HIV: Non-reactive (09/30 0000)  GBS: Negative (03/15 0000)   FHR: baseline 130s, moderate variability, + accels, one shallow late decel to 100s, no other decels Toco: irregular ctx, q5-10 min   Assessment/Plan: Michaela Moran is a 32 y.o. G2P1001 at [redacted]w[redacted]d who presents for induction of labor secondary to post-term pregnancy. Hx of c/s, desires TOLAC. Consent scanned into chart. Cervix unfavorable at this time. Vertex presentation confirmed by bedside ultrasound. Pt had marked discomfort with cervical exam, making foley bulb not feasible at this time. Unable to ripen cervix with cytotec due to history of cesarean section. Will start low dose pitocin and monitor maternal and fetal status closely.  Plan: -Admit to labor and delivery for induction and TOLAC -low dose pitocin -may have epidural upon request -GBS negative -anticipate NSVD/VBAC   Levert Feinstein 12/31/2012, 9:46 PM  .I have seen the patient with the resident/student and agree with the above.  Tawnya Crook

## 2013-01-01 ENCOUNTER — Ambulatory Visit (HOSPITAL_COMMUNITY): Admission: RE | Admit: 2013-01-01 | Payer: Medicaid Other | Source: Ambulatory Visit

## 2013-01-01 LAB — RPR: RPR Ser Ql: NONREACTIVE

## 2013-01-01 LAB — TYPE AND SCREEN
ABO/RH(D): AB POS
Antibody Screen: NEGATIVE

## 2013-01-01 LAB — ABO/RH: ABO/RH(D): AB POS

## 2013-01-01 MED ORDER — DIPHENHYDRAMINE HCL 50 MG/ML IJ SOLN: 12.5000 mg | INTRAMUSCULAR | Status: DC | PRN

## 2013-01-01 MED ORDER — EPHEDRINE 5 MG/ML INJ
10.0000 mg | INTRAVENOUS | Status: DC | PRN
  Filled 2013-01-01: qty 4

## 2013-01-01 MED ORDER — FENTANYL 2.5 MCG/ML BUPIVACAINE 1/10 % EPIDURAL INFUSION (WH - ANES)
14.0000 mL/h | INTRAMUSCULAR | Status: DC | PRN
  Administered 2013-01-01 – 2013-01-02 (×4): 14 mL/h via EPIDURAL
  Filled 2013-01-01 (×4): qty 125

## 2013-01-01 MED ORDER — OXYTOCIN 40 UNITS IN LACTATED RINGERS INFUSION - SIMPLE MED: 1.0000 m[IU]/min | INTRAVENOUS | Status: DC

## 2013-01-01 MED ORDER — PHENYLEPHRINE 40 MCG/ML (10ML) SYRINGE FOR IV PUSH (FOR BLOOD PRESSURE SUPPORT): 80.0000 ug | PREFILLED_SYRINGE | INTRAVENOUS | Status: DC | PRN

## 2013-01-01 MED ORDER — FENTANYL CITRATE 0.05 MG/ML IJ SOLN
100.0000 ug | INTRAMUSCULAR | Status: DC | PRN
  Administered 2013-01-01 (×2): 100 ug via INTRAVENOUS
  Filled 2013-01-01 (×2): qty 2

## 2013-01-01 MED ORDER — PHENYLEPHRINE 40 MCG/ML (10ML) SYRINGE FOR IV PUSH (FOR BLOOD PRESSURE SUPPORT)
80.0000 ug | PREFILLED_SYRINGE | INTRAVENOUS | Status: DC | PRN
  Filled 2013-01-01: qty 5

## 2013-01-01 MED ORDER — LACTATED RINGERS IV SOLN
500.0000 mL | Freq: Once | INTRAVENOUS | Status: AC
  Administered 2013-01-01: 500 mL via INTRAVENOUS

## 2013-01-01 MED ORDER — SODIUM BICARBONATE 8.4 % IV SOLN
INTRAVENOUS | Status: DC | PRN
  Administered 2013-01-01: 5 mL via EPIDURAL

## 2013-01-01 MED ORDER — EPHEDRINE 5 MG/ML INJ: 10.0000 mg | INTRAVENOUS | Status: DC | PRN

## 2013-01-01 NOTE — Progress Notes (Signed)
   Michaela Moran is a 32 y.o. G2P1001 at [redacted]w[redacted]d  admitted for induction of labor due to Post dates. Due date 12/20/12.  Subjective: Comfortable with epidural except for one spot "on the inside"  Objective: BP 92/49  Pulse 75  Temp(Src) 97.7 F (36.5 C) (Oral)  Resp 18  Ht 5\' 6"  (1.676 m)  Wt 170 lb (77.111 kg)  BMI 27.45 kg/m2  SpO2 100%  LMP 03/31/2012    FHT:  FHR: 140 bpm, variability: moderate,  accelerations:  Present,  decelerations:  Absent UC:   irregular, every 2-4 minutes, mild.  IUPC placed SVE:   Dilation: 3 Effacement (%): 80 Station: -2 Exam by:: dr. ferry Pitocin @ 2 mu/min  Labs: Lab Results  Component Value Date   WBC 9.3 12/31/2012   HGB 11.2* 12/31/2012   HCT 33.3* 12/31/2012   MCV 89.8 12/31/2012   PLT 345 12/31/2012    Assessment / Plan: IOL for postdates, TOLAC, latent labor Increase Pitocin Labor: latent Fetal Wellbeing:  Category I Pain Control:  Epidural Anticipated MOD:  NSVD  Michaela Moran 01/01/2013, 6:01 PM

## 2013-01-01 NOTE — Progress Notes (Signed)
Booker CNM updated on SROM with clear fluids, FHR, and UC pattern.  No new orders given.

## 2013-01-01 NOTE — Progress Notes (Signed)
   Michaela Moran is a 32 y.o. G2P1001 at [redacted]w[redacted]d  admitted for induction of labor due to Post dates..  Subjective: Feels a "crampy" pain in lower abdomen  Objective: BP 100/66  Pulse 87  Temp(Src) 98.3 F (36.8 C) (Oral)  Resp 20  Ht 5\' 6"  (1.676 m)  Wt 170 lb (77.111 kg)  BMI 27.45 kg/m2  SpO2 100%  LMP 03/31/2012 Total I/O In: -  Out: 800 [Urine:800]  FHT:  140, avg LTV, + accels, no decels UC:   regular, every 2-3  Minutes; MVU's SVE:   Dilation: 8 Effacement (%): 90 Station: -1 Exam by:: F C. Dishmon, CNM Pitocin @ 10 mu/min  Labs: Lab Results  Component Value Date   WBC 9.3 12/31/2012   HGB 11.2* 12/31/2012   HCT 33.3* 12/31/2012   MCV 89.8 12/31/2012   PLT 345 12/31/2012    Assessment / Plan: Induction of labor due to postterm,  progressing well on pitocin  Labor: Progressing normally Fetal Wellbeing:  Category 1 Pain Control:  Epidural Anticipated MOD:  NSVD  CRESENZO-DISHMAN,Rubel Heckard 01/01/2013, 11:25 PM

## 2013-01-01 NOTE — Progress Notes (Signed)
Michaela Moran is a 32 y.o. G2P1001 at [redacted]w[redacted]d by ultrasound ([redacted]w[redacted]d) admitted for induction of labor due to post dates. Due date 12/20/12.  Subjective: Feeling well, no complaints  Objective: BP 95/51  Pulse 89  Temp(Src) 97.8 F (36.6 C) (Oral)  Resp 18  Ht 5\' 6"  (1.676 m)  Wt 77.111 kg (170 lb)  BMI 27.45 kg/m2  LMP 03/31/2012     FHT:  FHR: 130s bpm, variability: moderate,  accelerations:  Present,  decelerations:  Present 1-2 late decels UC:   irregular, every 4-5 minutes SVE:   Dilation: 1 Effacement (%): Thick Station: -3 Exam by:: Thressa Sheller, CNM (have not rechecked since starting pitocin, ctx pattern unchanged and cervix previously unfavorable)  Labs: Lab Results  Component Value Date   WBC 9.3 12/31/2012   HGB 11.2* 12/31/2012   HCT 33.3* 12/31/2012   MCV 89.8 12/31/2012   PLT 345 12/31/2012    Assessment / Plan: Induction of labor due to postterm  Labor: on low dose pitocin, cervix unfavorable Preeclampsia:  n/a Fetal Wellbeing:  Category II Pain Control:  Labor support without medications I/D:  GBS negative Anticipated MOD:  NSVD  Levert Feinstein 01/01/2013, 1:32 AM

## 2013-01-01 NOTE — Anesthesia Preprocedure Evaluation (Addendum)
Anesthesia Evaluation  Patient identified by MRN, date of birth, ID band Patient awake    Reviewed: Allergy & Precautions, H&P , NPO status , Patient's Chart, lab work & pertinent test results  Airway Mallampati: II TM Distance: >3 FB Neck ROM: full    Dental no notable dental hx. (+) Teeth Intact   Pulmonary neg pulmonary ROS,  breath sounds clear to auscultation  Pulmonary exam normal       Cardiovascular negative cardio ROS  Rhythm:regular Rate:Normal     Neuro/Psych negative neurological ROS  negative psych ROS   GI/Hepatic negative GI ROS, Neg liver ROS, GERD-  Medicated and Controlled,  Endo/Other  negative endocrine ROS  Renal/GU negative Renal ROS  negative genitourinary   Musculoskeletal negative musculoskeletal ROS (+)   Abdominal   Peds  Hematology negative hematology ROS (+)   Anesthesia Other Findings       Reproductive/Obstetrics (+) Pregnancy                          Anesthesia Physical Anesthesia Plan  ASA: II and emergent  Anesthesia Plan: Epidural   Post-op Pain Management:    Induction:   Airway Management Planned: Natural Airway  Additional Equipment:   Intra-op Plan:   Post-operative Plan:   Informed Consent: I have reviewed the patients History and Physical, chart, labs and discussed the procedure including the risks, benefits and alternatives for the proposed anesthesia with the patient or authorized representative who has indicated his/her understanding and acceptance.   Dental Advisory Given  Plan Discussed with: CRNA, Anesthesiologist and Surgeon  Anesthesia Plan Comments: (Labs checked- platelets confirmed with RN in room. Fetal heart tracing, per RN, reported to be stable enough for sitting procedure. Discussed epidural, and patient consents to the procedure:  included risk of possible headache,backache, failed block, allergic reaction, and  nerve injury. This patient was asked if she had any questions or concerns before the procedure started. )       Anesthesia Quick Evaluation

## 2013-01-01 NOTE — Anesthesia Procedure Notes (Signed)

## 2013-01-01 NOTE — Progress Notes (Signed)
Aira Wurm is a 32 y.o. G2P1001 at [redacted]w[redacted]d by ultrasound ([redacted]w[redacted]d) admitted for induction of labor due to post dates. Due date 12/20/12.  Subjective: Feeling contractions but pain is well controlled  Objective: BP 97/51  Pulse 88  Temp(Src) 97.4 F (36.3 C) (Oral)  Resp 20  Ht 5\' 6"  (1.676 m)  Wt 170 lb (77.111 kg)  BMI 27.45 kg/m2  LMP 03/31/2012     FHT:  FHR: 120s bpm, variability: moderate,  accelerations:  Present,  decelerations:  Present variable UC:   irregular, every 2-3 minutes SVE:   Dilation: 1.5 Effacement (%): Thick Station: -2 Exam by:: [email or web address]  Labs: Lab Results  Component Value Date   WBC 9.3 12/31/2012   HGB 11.2* 12/31/2012   HCT 33.3* 12/31/2012   MCV 89.8 12/31/2012   PLT 345 12/31/2012    Assessment / Plan: Induction of labor due to postterm  Labor: on low dose pitocin, cervix unfavorable Preeclampsia:  n/a Fetal Wellbeing:  Category II Pain Control:  Labor support without medications I/D:  GBS negative Anticipated MOD:  NSVD  Levert Feinstein 01/01/2013, 8:01 AM

## 2013-01-01 NOTE — Progress Notes (Signed)
Patient ID: Michaela Moran, female   DOB: [birth year], 32 y.o.   MRN: [id number]   S:  Pt now has epidural, comfortable.  O: Filed Vitals:   01/01/13 1223  BP: 98/68  Pulse: 85  Temp:   Resp:     Cervix:  1/50/-2, posterior FHTs:  130, moderate variability, accels present, occasional late, decel to 90s x 1.5 min with check, overall reassuring TOCO:  q 2-4 min  Foley bulb placed  A/P 32 y.o. G2P1001 at [redacted]w[redacted]d here for IOL for post-dates. Previous c-section. On pitocin - holding at 4 milli-unit/min (had been up to 12) Cervix still 1 cm. Foley bulb placed SROM x 8 hours  Will increase pitocin when FB out.  [name], MD

## 2013-01-01 NOTE — Progress Notes (Signed)
   Michaela Moran is a 32 y.o. G2P1001 at [redacted]w[redacted]d  admitted for induction of labor due to Post dates. .  Subjective: Feels pressure  Objective: BP 113/77  Pulse 104  Temp(Src) 98.1 F (36.7 C) (Oral)  Resp 20  Ht 5\' 6"  (1.676 m)  Wt 170 lb (77.111 kg)  BMI 27.45 kg/m2  SpO2 97%  LMP 03/31/2012    FHT:  FHR: 150 bpm, variability: moderate,  accelerations:  Present,  decelerations:  Absent UC:   regular, every 2-3 minutes; MVU's ~ 200 SVE:   Dilation: 4 Effacement (%): 80 Station: 0 Exam by:: Ace Gins, RN Pitocin @ 10 mu/min  Labs: Lab Results  Component Value Date   WBC 9.3 12/31/2012   HGB 11.2* 12/31/2012   HCT 33.3* 12/31/2012   MCV 89.8 12/31/2012   PLT 345 12/31/2012    Assessment / Plan: Induction of labor due to post dates,  progressing well on pitocin  Labor: Progressing normally Fetal Wellbeing:  Category I Pain Control:  Epidural Anticipated MOD:  NSVD  CRESENZO-DISHMAN,Oather [place] 01/01/2013, 8:55 PM

## 2013-01-01 NOTE — Progress Notes (Signed)
   Michaela Moran is a 32 y.o. G2P1001 at [redacted]w[redacted]d  admitted for induction of labor due to Post dates..  Subjective: Comfortable with epidural  Objective: BP 95/60  Pulse 78  Temp(Src) 97.5 F (36.4 C) (Oral)  Resp 20  Ht 5\' 6"  (1.676 m)  Wt 170 lb (77.111 kg)  BMI 27.45 kg/m2  SpO2 100%  LMP 03/31/2012 Total I/O In: -  Out: 800 [Urine:800]  FHT:  FHR: 130 bpm, variability: moderate,  accelerations:  Present,  decelerations:  Present late decels UC:   regular, every 2-3  Minutes; MVU's SVE:   Dilation: 6 Effacement (%): 90 Station: -1 Exam by:: Joyce Copa, CNM Pitocin @ 12 mu/min  Labs: Lab Results  Component Value Date   WBC 9.3 12/31/2012   HGB 11.2* 12/31/2012   HCT 33.3* 12/31/2012   MCV 89.8 12/31/2012   PLT 345 12/31/2012    Assessment / Plan: Induction of labor due to postterm,  progressing well on pitocin  Labor: Progressing normally Fetal Wellbeing:  Category II Pain Control:  Epidural Anticipated MOD:  NSVD  CRESENZO-DISHMAN,Markel Mergenthaler 01/01/2013, 9:57 PM

## 2013-01-02 ENCOUNTER — Encounter (HOSPITAL_COMMUNITY): Payer: Self-pay | Admitting: Anesthesiology

## 2013-01-02 ENCOUNTER — Encounter (HOSPITAL_COMMUNITY)
Admission: RE | Disposition: A | Discharge status: Home / Self Care | Payer: Self-pay | Source: Ambulatory Visit | Attending: Obstetrics & Gynecology

## 2013-01-02 ENCOUNTER — Encounter (HOSPITAL_COMMUNITY): Payer: Self-pay

## 2013-01-02 ENCOUNTER — Inpatient Hospital Stay (HOSPITAL_COMMUNITY): Payer: Medicaid Other | Admitting: Anesthesiology

## 2013-01-02 DIAGNOSIS — O480 Post-term pregnancy: Secondary | ICD-10-CM

## 2013-01-02 DIAGNOSIS — O34219 Maternal care for unspecified type scar from previous cesarean delivery: Secondary | ICD-10-CM | POA: Diagnosis present

## 2013-01-02 DIAGNOSIS — O324XX0 Maternal care for high head at term, not applicable or unspecified: Secondary | ICD-10-CM

## 2013-01-02 SURGERY — Surgical Case
Anesthesia: Epidural | Site: Abdomen | Wound class: Clean Contaminated

## 2013-01-02 MED ORDER — METOCLOPRAMIDE HCL 5 MG/ML IJ SOLN: 10.0000 mg | Freq: Three times a day (TID) | INTRAMUSCULAR | Status: DC | PRN

## 2013-01-02 MED ORDER — STERILE WATER FOR INJECTION IJ SOLN
INTRAMUSCULAR | Status: DC | PRN
  Administered 2013-01-02: 1000 mL

## 2013-01-02 MED ORDER — MEASLES, MUMPS & RUBELLA VAC ~~LOC~~ INJ
0.5000 mL | INJECTION | Freq: Once | SUBCUTANEOUS | Status: DC
  Filled 2013-01-02: qty 0.5

## 2013-01-02 MED ORDER — SODIUM CHLORIDE 0.9 % IJ SOLN: 3.0000 mL | INTRAMUSCULAR | Status: DC | PRN

## 2013-01-02 MED ORDER — WITCH HAZEL-GLYCERIN EX PADS: 1.0000 "application " | MEDICATED_PAD | CUTANEOUS | Status: DC | PRN

## 2013-01-02 MED ORDER — PRENATAL MULTIVITAMIN CH
1.0000 | ORAL_TABLET | Freq: Every day | ORAL | Status: DC
  Administered 2013-01-03 – 2013-01-05 (×3): 1 via ORAL
  Filled 2013-01-02 (×3): qty 1

## 2013-01-02 MED ORDER — MORPHINE SULFATE (PF) 0.5 MG/ML IJ SOLN
INTRAMUSCULAR | Status: DC | PRN
  Administered 2013-01-02: 4 mg via EPIDURAL

## 2013-01-02 MED ORDER — NALBUPHINE HCL 10 MG/ML IJ SOLN: 5.0000 mg | INTRAMUSCULAR | Status: DC | PRN

## 2013-01-02 MED ORDER — LANOLIN HYDROUS EX OINT: 1.0000 "application " | TOPICAL_OINTMENT | CUTANEOUS | Status: DC | PRN

## 2013-01-02 MED ORDER — LACTATED RINGERS IV SOLN
INTRAVENOUS | Status: DC | PRN
  Administered 2013-01-02: 16:00:00 via INTRAVENOUS

## 2013-01-02 MED ORDER — EPHEDRINE 5 MG/ML INJ
INTRAVENOUS | Status: AC
  Filled 2013-01-02: qty 10

## 2013-01-02 MED ORDER — OXYTOCIN 40 UNITS IN LACTATED RINGERS INFUSION - SIMPLE MED: 1.0000 m[IU]/min | INTRAVENOUS | Status: DC

## 2013-01-02 MED ORDER — ONDANSETRON HCL 4 MG/2ML IJ SOLN: 4.0000 mg | INTRAMUSCULAR | Status: DC | PRN

## 2013-01-02 MED ORDER — DIPHENHYDRAMINE HCL 50 MG/ML IJ SOLN: 12.5000 mg | INTRAMUSCULAR | Status: DC | PRN

## 2013-01-02 MED ORDER — LIDOCAINE-EPINEPHRINE (PF) 2 %-1:200000 IJ SOLN
INTRAMUSCULAR | Status: AC
  Filled 2013-01-02: qty 20

## 2013-01-02 MED ORDER — LACTATED RINGERS IV SOLN
INTRAVENOUS | Status: DC | PRN
  Administered 2013-01-02 (×2): via INTRAVENOUS

## 2013-01-02 MED ORDER — OXYTOCIN 10 UNIT/ML IJ SOLN
40.0000 [IU] | INTRAVENOUS | Status: DC | PRN
  Administered 2013-01-02: 40 [IU] via INTRAVENOUS

## 2013-01-02 MED ORDER — MEPERIDINE HCL 25 MG/ML IJ SOLN: 6.2500 mg | INTRAMUSCULAR | Status: DC | PRN

## 2013-01-02 MED ORDER — DIPHENHYDRAMINE HCL 25 MG PO CAPS: 25.0000 mg | ORAL_CAPSULE | ORAL | Status: DC | PRN

## 2013-01-02 MED ORDER — MORPHINE SULFATE (PF) 0.5 MG/ML IJ SOLN
INTRAMUSCULAR | Status: DC | PRN
  Administered 2013-01-02 (×2): .5 mg via EPIDURAL

## 2013-01-02 MED ORDER — FENTANYL CITRATE 0.05 MG/ML IJ SOLN: 25.0000 ug | INTRAMUSCULAR | Status: DC | PRN

## 2013-01-02 MED ORDER — MORPHINE SULFATE 0.5 MG/ML IJ SOLN
INTRAMUSCULAR | Status: AC
  Filled 2013-01-02: qty 10

## 2013-01-02 MED ORDER — DIBUCAINE 1 % RE OINT: 1.0000 "application " | TOPICAL_OINTMENT | RECTAL | Status: DC | PRN

## 2013-01-02 MED ORDER — BUPIVACAINE HCL (PF) 0.25 % IJ SOLN
INTRAMUSCULAR | Status: DC | PRN
  Administered 2013-01-02: 30 mL

## 2013-01-02 MED ORDER — NALOXONE HCL 0.4 MG/ML IJ SOLN: 0.4000 mg | INTRAMUSCULAR | Status: DC | PRN

## 2013-01-02 MED ORDER — ONDANSETRON HCL 4 MG/2ML IJ SOLN
INTRAMUSCULAR | Status: DC | PRN
  Administered 2013-01-02: 4 mg via INTRAVENOUS

## 2013-01-02 MED ORDER — CEFAZOLIN SODIUM-DEXTROSE 2-3 GM-% IV SOLR
2.0000 g | Freq: Once | INTRAVENOUS | Status: AC
  Administered 2013-01-02: 2 g via INTRAVENOUS
  Filled 2013-01-02: qty 50

## 2013-01-02 MED ORDER — MENTHOL 3 MG MT LOZG: 1.0000 | LOZENGE | OROMUCOSAL | Status: DC | PRN

## 2013-01-02 MED ORDER — ONDANSETRON HCL 4 MG/2ML IJ SOLN
INTRAMUSCULAR | Status: AC
  Filled 2013-01-02: qty 2

## 2013-01-02 MED ORDER — CEFAZOLIN SODIUM-DEXTROSE 2-3 GM-% IV SOLR: 2.0000 g | INTRAVENOUS | Status: DC

## 2013-01-02 MED ORDER — KETOROLAC TROMETHAMINE 30 MG/ML IJ SOLN: 30.0000 mg | Freq: Four times a day (QID) | INTRAMUSCULAR | Status: AC | PRN

## 2013-01-02 MED ORDER — OXYTOCIN 10 UNIT/ML IJ SOLN
INTRAMUSCULAR | Status: AC
  Filled 2013-01-02: qty 4

## 2013-01-02 MED ORDER — DEXTROSE IN LACTATED RINGERS 5 % IV SOLN
INTRAVENOUS | Status: DC
  Administered 2013-01-02 – 2013-01-03 (×2): via INTRAVENOUS

## 2013-01-02 MED ORDER — OXYCODONE-ACETAMINOPHEN 5-325 MG PO TABS
1.0000 | ORAL_TABLET | ORAL | Status: DC | PRN
  Administered 2013-01-03 – 2013-01-05 (×5): 1 via ORAL
  Administered 2013-01-05: 2 via ORAL
  Filled 2013-01-02 (×6): qty 1

## 2013-01-02 MED ORDER — DIPHENHYDRAMINE HCL 25 MG PO CAPS: 25.0000 mg | ORAL_CAPSULE | Freq: Four times a day (QID) | ORAL | Status: DC | PRN

## 2013-01-02 MED ORDER — OXYTOCIN 40 UNITS IN LACTATED RINGERS INFUSION - SIMPLE MED: 62.5000 mL/h | INTRAVENOUS | Status: AC

## 2013-01-02 MED ORDER — EPHEDRINE SULFATE 50 MG/ML IJ SOLN
INTRAMUSCULAR | Status: DC | PRN
  Administered 2013-01-02 (×2): 10 mg via INTRAVENOUS

## 2013-01-02 MED ORDER — IBUPROFEN 600 MG PO TABS
600.0000 mg | ORAL_TABLET | Freq: Four times a day (QID) | ORAL | Status: DC
  Administered 2013-01-03 – 2013-01-05 (×9): 600 mg via ORAL
  Filled 2013-01-02 (×9): qty 1

## 2013-01-02 MED ORDER — DIPHENHYDRAMINE HCL 50 MG/ML IJ SOLN: 25.0000 mg | INTRAMUSCULAR | Status: DC | PRN

## 2013-01-02 MED ORDER — ERYTHROMYCIN 5 MG/GM OP OINT
TOPICAL_OINTMENT | OPHTHALMIC | Status: AC
  Filled 2013-01-02: qty 1

## 2013-01-02 MED ORDER — SENNOSIDES-DOCUSATE SODIUM 8.6-50 MG PO TABS
2.0000 | ORAL_TABLET | Freq: Every day | ORAL | Status: DC
  Administered 2013-01-02 – 2013-01-04 (×3): 2 via ORAL

## 2013-01-02 MED ORDER — SIMETHICONE 80 MG PO CHEW: 80.0000 mg | CHEWABLE_TABLET | ORAL | Status: DC | PRN

## 2013-01-02 MED ORDER — KETOROLAC TROMETHAMINE 30 MG/ML IJ SOLN
30.0000 mg | Freq: Four times a day (QID) | INTRAMUSCULAR | Status: AC | PRN
  Administered 2013-01-02 – 2013-01-03 (×2): 30 mg via INTRAVENOUS
  Filled 2013-01-02 (×2): qty 1

## 2013-01-02 MED ORDER — ONDANSETRON HCL 4 MG/2ML IJ SOLN: 4.0000 mg | Freq: Three times a day (TID) | INTRAMUSCULAR | Status: DC | PRN

## 2013-01-02 MED ORDER — ONDANSETRON HCL 4 MG PO TABS: 4.0000 mg | ORAL_TABLET | ORAL | Status: DC | PRN

## 2013-01-02 MED ORDER — SODIUM BICARBONATE 8.4 % IV SOLN
INTRAVENOUS | Status: AC
  Filled 2013-01-02: qty 50

## 2013-01-02 MED ORDER — BUPIVACAINE HCL (PF) 0.25 % IJ SOLN
INTRAMUSCULAR | Status: AC
  Filled 2013-01-02: qty 30

## 2013-01-02 MED ORDER — TETANUS-DIPHTH-ACELL PERTUSSIS 5-2.5-18.5 LF-MCG/0.5 IM SUSP: 0.5000 mL | Freq: Once | INTRAMUSCULAR | Status: DC

## 2013-01-02 MED ORDER — SIMETHICONE 80 MG PO CHEW
80.0000 mg | CHEWABLE_TABLET | Freq: Three times a day (TID) | ORAL | Status: DC
  Administered 2013-01-02 – 2013-01-04 (×8): 80 mg via ORAL

## 2013-01-02 MED ORDER — KETOROLAC TROMETHAMINE 30 MG/ML IJ SOLN
INTRAMUSCULAR | Status: AC
  Administered 2013-01-02: 30 mg via INTRAMUSCULAR
  Filled 2013-01-02: qty 1

## 2013-01-02 MED ORDER — NALOXONE HCL 1 MG/ML IJ SOLN: 1.0000 ug/kg/h | INTRAVENOUS | Status: DC | PRN

## 2013-01-02 MED ORDER — ZOLPIDEM TARTRATE 5 MG PO TABS: 5.0000 mg | ORAL_TABLET | Freq: Every evening | ORAL | Status: DC | PRN

## 2013-01-02 MED ORDER — PHENYLEPHRINE HCL 10 MG/ML IJ SOLN
INTRAMUSCULAR | Status: DC | PRN
  Administered 2013-01-02 (×6): 80 ug via INTRAVENOUS

## 2013-01-02 MED ORDER — PHENYLEPHRINE 40 MCG/ML (10ML) SYRINGE FOR IV PUSH (FOR BLOOD PRESSURE SUPPORT)
PREFILLED_SYRINGE | INTRAVENOUS | Status: AC
  Filled 2013-01-02: qty 15

## 2013-01-02 SURGICAL SUPPLY — 36 items
BARRIER ADHS 3X4 INTERCEED (GAUZE/BANDAGES/DRESSINGS) ×2 IMPLANT
BENZOIN TINCTURE PRP APPL 2/3 (GAUZE/BANDAGES/DRESSINGS) ×2 IMPLANT
CLOTH BEACON ORANGE TIMEOUT ST (SAFETY) ×2 IMPLANT
DRAPE LG THREE QUARTER DISP (DRAPES) ×2 IMPLANT
DRSG OPSITE 6X11 MED (GAUZE/BANDAGES/DRESSINGS) ×2 IMPLANT
DRSG OPSITE POSTOP 4X10 (GAUZE/BANDAGES/DRESSINGS) ×2 IMPLANT
DURAPREP 26ML APPLICATOR (WOUND CARE) ×2 IMPLANT
ELECT REM PT RETURN 9FT ADLT (ELECTROSURGICAL) ×2
ELECTRODE REM PT RTRN 9FT ADLT (ELECTROSURGICAL) ×1 IMPLANT
EXTRACTOR VACUUM M CUP 4 TUBE (SUCTIONS) IMPLANT
GLOVE BIOGEL PI IND STRL 7.0 (GLOVE) ×1 IMPLANT
GLOVE BIOGEL PI INDICATOR 7.0 (GLOVE) ×1
GLOVE ECLIPSE 7.0 STRL STRAW (GLOVE) ×4 IMPLANT
GOWN PREVENTION PLUS XLARGE (GOWN DISPOSABLE) ×2 IMPLANT
GOWN STRL REIN XL XLG (GOWN DISPOSABLE) ×4 IMPLANT
KIT ABG SYR 3ML LUER SLIP (SYRINGE) ×2 IMPLANT
NEEDLE HYPO 22GX1.5 SAFETY (NEEDLE) ×2 IMPLANT
NEEDLE HYPO 25X5/8 SAFETYGLIDE (NEEDLE) IMPLANT
NS IRRIG 1000ML POUR BTL (IV SOLUTION) ×2 IMPLANT
PACK C SECTION WH (CUSTOM PROCEDURE TRAY) ×2 IMPLANT
PAD ABD 7.5X8 STRL (GAUZE/BANDAGES/DRESSINGS) ×2 IMPLANT
PAD OB MATERNITY 4.3X12.25 (PERSONAL CARE ITEMS) ×2 IMPLANT
RTRCTR C-SECT PINK 25CM LRG (MISCELLANEOUS) ×2 IMPLANT
SLEEVE SCD COMPRESS KNEE MED (MISCELLANEOUS) IMPLANT
STAPLER VISISTAT 35W (STAPLE) IMPLANT
STRIP CLOSURE SKIN 1/2X4 (GAUZE/BANDAGES/DRESSINGS) ×2 IMPLANT
SUT MON AB 3-0 SH 27 (SUTURE) ×2
SUT MON AB 3-0 SH27 (SUTURE) ×2 IMPLANT
SUT VIC AB 0 CTX 36 (SUTURE) ×3
SUT VIC AB 0 CTX36XBRD ANBCTRL (SUTURE) ×3 IMPLANT
SUT VIC AB 4-0 KS 27 (SUTURE) IMPLANT
SYR 30ML LL (SYRINGE) ×2 IMPLANT
TAPE CLOTH SURG 4X10 WHT LF (GAUZE/BANDAGES/DRESSINGS) ×2 IMPLANT
TOWEL OR 17X24 6PK STRL BLUE (TOWEL DISPOSABLE) ×6 IMPLANT
TRAY FOLEY CATH 14FR (SET/KITS/TRAYS/PACK) IMPLANT
WATER STERILE IRR 1000ML POUR (IV SOLUTION) IMPLANT

## 2013-01-02 NOTE — Progress Notes (Signed)
Pt. Is a TOLAC who has had a prolonged second stage and a likely OP baby.  She had an attempt at VAVD by Dr. Thad Ranger with 2 pop-off's while I was present.  The infant is at + 2 station with caput to +3-4.  She has very poor maternal propulsive effort.  She reports feeling very tired and frustrated and is now requesting elective repeat.  Risks discussed.  Anesthesia informed.

## 2013-01-02 NOTE — Progress Notes (Signed)
   Michaela Moran is a 32 y.o. G2P1001 at [redacted]w[redacted]d  admitted for induction of labor due to Post dates..  Subjective: Comfortable except for some pressure.  No urge to push  Objective: BP 117/80  Pulse 93  Temp(Src) 98.5 F (36.9 C) (Oral)  Resp 20  Ht 5\' 6"  (1.676 m)  Wt 170 lb (77.111 kg)  BMI 27.45 kg/m2  SpO2 99%  LMP 03/31/2012 Total I/O In: -  Out: 1325 [Urine:1325]  FHT: 140's. avg :LTV, + accels;  Occ late/early decel.  UC:   regular, every 2-3  Minutes; MVU's SVE:   Dilation: 10 Effacement (%): 100 Station: -1 Exam by:: Doree Barthel, RN Pit at 46mu/min Labs: Lab Results  Component Value Date   WBC 9.3 12/31/2012   HGB 11.2* 12/31/2012   HCT 33.3* 12/31/2012   MCV 89.8 12/31/2012   PLT 345 12/31/2012   Had a brief trial of pushing with no descent.  Poor maternal effort, as well  Assessment / Plan: ?OP Will allow to labor down, changing positions Labor: Progressing normally Fetal Wellbeing:  Category 1/occ 2 Pain Control:  Epidural Anticipated MOD:  NSVD  CRESENZO-DISHMAN,Michaela Moran 01/02/2013, 5:13 AM

## 2013-01-02 NOTE — Progress Notes (Signed)
   Michaela Moran is a 32 y.o. G2P1001 at [redacted]w[redacted]d  admitted for induction of labor due to Post dates..  Subjective: Comfortable except for some pressure  Objective: BP 109/80  Pulse 85  Temp(Src) 98.3 Michaela (36.8 C) (Oral)  Resp 20  Ht 5\' 6"  (1.676 m)  Wt 170 lb (77.111 kg)  BMI 27.45 kg/m2  SpO2 99%  LMP 03/31/2012 Total I/O In: -  Out: 1325 [Urine:1325]  FHT: 140's. avg :LTV, + accels;  Occ late decel.   Had a prolonged decel in the 90's for about 6 minutes.  Responded to position change. 02 and pitocin was turned off UC:   regular, every 2-3  Minutes; MVU's SVE:   Dilation: 8 Effacement (%): 90 Station: -1 Exam by:: Michaela Moran, CNM  Labs: Lab Results  Component Value Date   WBC 9.3 12/31/2012   HGB 11.2* 12/31/2012   HCT 33.3* 12/31/2012   MCV 89.8 12/31/2012   PLT 345 12/31/2012    Assessment / Plan: Induction of labor due to postterm,  progressing well on pitocin  Prolonged variable deceleration  Turn pitocin back on in a few minutes if FHR stays stable  Labor: Progressing normally Fetal Wellbeing:  Category 2 Pain Control:  Epidural Anticipated MOD:  NSVD  Michaela Moran 01/02/2013, 12:29 AM

## 2013-01-02 NOTE — Progress Notes (Signed)
Dr. Thad Ranger recommending vacuum assist after speaking with Dr. Shawnie Pons.  Patient and husband voice consent.

## 2013-01-02 NOTE — Anesthesia Postprocedure Evaluation (Signed)
  Anesthesia Post-op Note  [name]  Procedure(s) Performed: Procedure(s) with comments: CESAREAN SECTION (N/A) - Incidental Cyystotomy  Patient Location: PACU  Anesthesia Type:Epidural  Level of Consciousness: awake, alert  and oriented  Airway and Oxygen Therapy: Patient Spontanous Breathing  Post-op Pain: none  Post-op Assessment: Post-op Vital signs reviewed, Patient's Cardiovascular Status Stable, Respiratory Function Stable, Patent Airway, No signs of Nausea or vomiting, Pain level controlled, No headache, No backache, No residual numbness and No residual motor weakness  Post-op Vital Signs: Reviewed and stable  Complications: No apparent anesthesia complications

## 2013-01-02 NOTE — Progress Notes (Signed)
Somalian interpreter ID [phone] on the phone to assist in instructing patient further to push.

## 2013-01-02 NOTE — Progress Notes (Signed)
   Michaela Moran is a 32 y.o. G2P1001 at [redacted]w[redacted]d  admitted for induction of labor due to Post dates..  Subjective: Comfortable except for some pressure  Objective: BP 117/80  Pulse 93  Temp(Src) 98.5 F (36.9 C) (Oral)  Resp 20  Ht 5\' 6"  (1.676 m)  Wt 170 lb (77.111 kg)  BMI 27.45 kg/m2  SpO2 99%  LMP 03/31/2012 Total I/O In: -  Out: 1325 [Urine:1325]  FHT: 140's. avg :LTV, + accels;  Occ late/early decel.  UC:   regular, every 2-3  Minutes; MVU's SVE:   Dilation: 10 Effacement (%): 100 Station: -1 Exam by:: Michaela Barthel, RN Pit at 9mu/min Labs: Lab Results  Component Value Date   WBC 9.3 12/31/2012   HGB 11.2* 12/31/2012   HCT 33.3* 12/31/2012   MCV 89.8 12/31/2012   PLT 345 12/31/2012    Assessment / Plan: Induction of labor due to postterm,  progressing well on pitocin Will allow to labor down Labor: Progressing normally Fetal Wellbeing:  Category 1/occ 2 Pain Control:  Epidural Anticipated MOD:  NSVD  Moran,Michaela Thoen 01/02/2013, 5:11 AM

## 2013-01-02 NOTE — Transfer of Care (Signed)
Immediate Anesthesia Transfer of Care Note  [name]  Procedure(s) Performed: Procedure(s) with comments: CESAREAN SECTION (N/A) - Incidental Cyystotomy  Patient Location: PACU  Anesthesia Type:Epidural  Level of Consciousness: awake, alert  and oriented  Airway & Oxygen Therapy: Patient Spontanous Breathing  Post-op Assessment: Report given to PACU RN  Post vital signs: Reviewed and stable  Complications: No apparent anesthesia complications

## 2013-01-02 NOTE — Progress Notes (Signed)
This note also relates to the following rows which could not be included: Dose (milli-units/min) Oxytocin - Cannot attach notes to extension rows Rate (mL/hr) Oxytocin - Cannot attach notes to extension rows Concentration Oxytocin - Cannot attach notes to extension rows   Patient requesting C/S.  MD's aware.  Calling for interpreter.

## 2013-01-02 NOTE — Op Note (Signed)
Preoperative Diagnosis:  IUP @ [redacted]w[redacted]d, Previous C-section, Failed TOLAC  Postoperative Diagnosis:  Same, adhesive disease  Procedure: Repeat low transverse cesarean section, incidental cystotomy with repair.  Surgeon: Tinnie Gens, M.D.  Assistant: None  Findings: Viable female infant, vertex presentation, LOP position, extensive adhesions of the bladder to the uterus and of the peritoneum to the uterus and bladder.  Estimated blood loss: 1000 cc  Complications: None known  Specimens: Placenta to pathology  Reason for procedure: Briefly, the patient is a 32 y.o. [id number] [redacted]w[redacted]d who presents for IOL for postdates with previous C-section.  She progressed to fully dilated and had prolonged second stage.  Maternal pushing was hampered by language barrier.  Attempt at VAVD failed and after more pushing, pt. Opted for ERLTCS.  Procedure: Patient is a to the OR where spinal analgesia was administered. She was then placed in a supine position with left lateral tilt. She received 2g of Ancef and SCDs were in place. A timeout was performed. She was prepped and draped in the usual sterile fashion. A Foley catheter was placed in the bladder. A knife was then used to make a Pfannenstiel incision. This incision was carried out to underlying fascia which was divided in the midline with the knife. The incision was extended laterally, bluntly.  The rectus was divided in the midline.  The peritoneal cavity was attempted to be entered bluntly, however, an incidental cystotomy was noted.  Further efforts found the peritoneal cavity and the uterus.  There were adhesions between the uterus, bladder, peritoneum noted.  Alexis retractor was placed inside the incision.  A knife was used to make a low transverse incision on the uterus. This incision was carried down to the amniotic cavity was entered. Fetus was in LOP position and was brought up out of the incision without difficulty. Cord was clamped x 2 and cut. Infant taken  to waiting pediatrician.  Cord blood was obtained. Placenta was delivered from the uterus.  Uterus was cleaned with dry lap pads. Uterine incision closed with 0 Vicryl suture in a locked running fashion. A figure of eight was used to achieve hemostasis in the midline. Alexis retractor was removed from the abdomen.  The bladder was noted to have three small holes which were converted into one 6 cm cystotomy in the dome of the bladder, well away from ureteral openings.  Cystotomy closed with 3-0 Monocryl in 2 layers with the second imbricating.  Sterile milk used to retro-fill the bladder which was noted to be water tight.  A piece of interceed was placed between the uterus and bladder. Rectus muscle closure was done with 0 Vicryl suture.  Fascia is closed with 0 Vicryl suture in a running fashion. Subcutaneous tissue infused with 30cc 0.25% Marcaine.  Skin closed using 3-0 Vicryl on a Keith needle.  Steri strips applied, followed by pressure dressing.  All instrument, needle and lap counts were correct x 2.  Patient was awake and taken to PACU stable.  Infant accompanied mom, stable.  Patient was informed of cystotomy with husband in the OR.  Advised to consider vertical abdominal incision in the future for C-sections so the bladder could be avoided.   Jerusalem Brownstein SMD 01/02/2013 4:42 PM

## 2013-01-02 NOTE — Progress Notes (Addendum)
Patient ID: Michaela Moran, female   DOB: [birth year], 32 y.o.   MRN: [id number]  S:  Pt exhausted after pushing for 2.5 hours. Asked for c-section then decided to push more. Has tried all 4s (hands and knees) and pushing on both sides and now states she cannot push anymore.  O: Filed Vitals:   01/02/13 1401 01/02/13 1410 01/02/13 1431 01/02/13 1439  BP: 126/82  104/66   Pulse: 94  86   Temp:    99.5 F (37.5 C)  TempSrc:    Oral  Resp: 18 18 18    Height:      Weight:      SpO2:        Cervix: complete/+1 - caput at +2  FHTs:  145-150, min to mod variability, no decels TOCO: q 3-5 minutes  A/P 32 y.o. G2P1001 at [redacted]w[redacted]d here for IOL for post-dates. Previous c-section x 1. Attempted TOLAC, reached complete in early AM (3 or 4 am) but OP and not pushing well. Pushed for 2.5 hours with epidural level decreased and then off. Made some progress but reached point of exhaustion.  The risks of cesarean section were discussed with the patient with husband translating (per patient preference), including but were not limited to: bleeding which may require transfusion or reoperation; infection which may require antibiotics; injury to bowel, bladder, ureters or other surrounding organs; injury to the fetus; need for additional procedures including hysterectomy in the event of a life-threatening hemorrhage; placental abnormalities wth subsequent pregnancies, incisional problems, thromboembolic phenomenon and other postoperative/anesthesia complications.   The patient concurred with the proposed plan, giving informed written consent for the procedure.    OR and anesthesia notified. Antibiotics ordered. Bicitra given. SCDs placed. To OR when ready.  [name], MD

## 2013-01-02 NOTE — Progress Notes (Signed)
Patient ID: Michaela Moran, female   DOB: 11-15-1980, 32 y.o.   MRN: [id number]   S:  Pt feeling some pain and urge to have BM. Has been pushing 30 min and tired. Reports headache  O: Filed Vitals:   01/02/13 1000  BP: 112/78  Pulse: 77  Temp: 97.9 F (36.6 C)  Resp: 20   Cervix: complete/-1  FHTs;  120, mod variability, accels present, dips to 90s with pushing but good recovery. TOCO:  q2-3 min  A/P Labor down, continue to reposition to see if baby will move down into pelvix Rest 1 hour and reattempt pushing - will decrease epidural to get better pushing FHTs category 1  [name], MD

## 2013-01-02 NOTE — Progress Notes (Signed)
Patient has now changed her mind and wants to push more.

## 2013-01-02 NOTE — Progress Notes (Signed)
Received report for Markus Jarvis, RN

## 2013-01-03 LAB — CBC
Hemoglobin: 9.2 g/dL — ABNORMAL LOW (ref 12.0–15.0)
MCH: 30.1 pg (ref 26.0–34.0)
MCHC: 34.2 g/dL (ref 30.0–36.0)

## 2013-01-03 NOTE — Progress Notes (Signed)
Subjective: Postpartum Day 1: Cesarean Delivery Patient reports tolerating PO.    Objective: Vital signs in last 24 hours: Temp:  [97.3 F (36.3 C)-99.5 F (37.5 C)] 97.9 F (36.6 C) (04/19 0300) Pulse Rate:  [25-128] 94 (04/19 0300) Resp:  [18-20] 20 (04/19 0300) BP: (98-126)/(43-84) 103/68 mmHg (04/19 0300) SpO2:  [67 %-100 %] 100 % (04/19 0300)  Physical Exam:  General: alert, cooperative and appears stated age Lochia: appropriate Uterine Fundus: firm Incision: no significant drainage DVT Evaluation: No evidence of DVT seen on physical exam.   Recent Labs  12/31/12 2008 01/03/13 0640  HGB 11.2* 9.2*  HCT 33.3* 26.9*    Assessment/Plan: Status post Cesarean section. Doing well postoperatively.  Continue current care. Continue foley--leg bag training Nexplanon Breast feeding.  Bill Yohn S 01/03/2013, 7:54 AM

## 2013-01-03 NOTE — Lactation Note (Signed)
This note was copied from the chart of [name]. Lactation Consultation Note  Patient Name: Michaela Moran Today's Date: 01/03/2013 Reason for consult: Follow-up assessment   Maternal Data Formula Feeding for Exclusion: Yes Reason for exclusion: Mother's choice to formula and breast feed on admission  Feeding    LATCH Score/Interventions                      Lactation Tools Discussed/Used     Consult Status Consult Status: Follow-up Date: 01/04/13 Follow-up type: In-patient  Experienced BF mom reports that baby is nursing well. Has been sleepy at some feedings. No questions at present. Encouraged to call for assist prn  Pamelia Hoit 01/03/2013, 10:31 AM

## 2013-01-03 NOTE — Anesthesia Postprocedure Evaluation (Signed)
  Anesthesia Post-op Note  [name]  Procedure(s) Performed: Procedure(s) with comments: CESAREAN SECTION (N/A) - Incidental Cyystotomy  Patient Location: Mother/Baby  Anesthesia Type:Epidural  Level of Consciousness: awake, alert  and oriented  Airway and Oxygen Therapy: Patient Spontanous Breathing  Post-op Pain: mild  Post-op Assessment: Patient's Cardiovascular Status Stable, Respiratory Function Stable, Patent Airway, No signs of Nausea or vomiting, Pain level controlled, No headache, No backache and No residual numbness  Post-op Vital Signs: stable  Complications: No apparent anesthesia complications

## 2013-01-05 ENCOUNTER — Encounter: Payer: Self-pay | Admitting: Family Medicine

## 2013-01-05 ENCOUNTER — Encounter (HOSPITAL_COMMUNITY): Payer: Self-pay | Admitting: Family Medicine

## 2013-01-05 DIAGNOSIS — Z98891 History of uterine scar from previous surgery: Secondary | ICD-10-CM

## 2013-01-05 MED ORDER — FERROUS SULFATE 325 (65 FE) MG PO TABS: 325.0000 mg | ORAL_TABLET | Freq: Two times a day (BID) | ORAL | Status: DC

## 2013-01-05 MED ORDER — PRENATAL MULTIVITAMIN CH: 1.0000 | ORAL_TABLET | Freq: Every day | ORAL | Status: DC

## 2013-01-05 MED ORDER — IBUPROFEN 600 MG PO TABS: 600.0000 mg | ORAL_TABLET | Freq: Four times a day (QID) | ORAL | Status: DC

## 2013-01-05 MED ORDER — OXYCODONE-ACETAMINOPHEN 5-325 MG PO TABS: 1.0000 | ORAL_TABLET | ORAL | Status: DC | PRN

## 2013-01-05 MED ORDER — SENNOSIDES-DOCUSATE SODIUM 8.6-50 MG PO TABS: 2.0000 | ORAL_TABLET | Freq: Every day | ORAL | Status: DC

## 2013-01-05 NOTE — Progress Notes (Signed)
Ur chart review completed.  

## 2013-01-05 NOTE — Lactation Note (Signed)
This note was copied from the chart of Boy Latika Burtt. Lactation Consultation Note Mom states br feeding is going well; states milk is in; c/o slight nipple pain. Baby is using a paci, discussed paci use with mom and dad and enc them to avoid using paci for first few weeks until br feeding is well established.  Offered to assist with a feeding, and mom accepts.  Mom placed baby in cross cradle on right side and baby latched immediately with deep latch, rhythmic sucking, and frequent gulping. Mom easily hand expresses large amount of colostrum. States that she can feel baby sucking, feels a little uncomfortable but "not too much".  Baby does fall asleep at the breast, but continues sucking when stimulated. Instructed mom and dad to attempt to keep baby awake for a long feeding. Discussed jaundice with mom and dad, and the importance of frequent br feeding.  Comfort gels provided for mom at her request for something to put on her sore nipples. Reviewed instructions for comfort gel use.  Enc mom to call lactation office if she has any concerns, and to attend the B[facility]. Questions answered.   Patient Name: Michaela Moran Whichard Today's Date: 01/05/2013 Reason for consult: Follow-up assessment   Maternal Data Formula Feeding for Exclusion: No  Feeding Feeding Type: Breast Milk Feeding method: Breast Length of feed: 20 min  LATCH Score/Interventions Latch: Grasps breast easily, tongue down, lips flanged, rhythmical sucking. Intervention(s): Skin to skin;Teach feeding cues;Waking techniques  Audible Swallowing: Spontaneous and intermittent  Type of Nipple: Everted at rest and after stimulation  Comfort (Breast/Nipple): Filling, red/small blisters or bruises, mild/mod discomfort  Problem noted: Mild/Moderate discomfort Interventions (Mild/moderate discomfort): Comfort gels  Hold (Positioning): Assistance needed to correctly position infant at breast and maintain latch. Intervention(s): Breastfeeding  basics reviewed;Support Pillows;Position options;Skin to skin  LATCH Score: 8  Lactation Tools Discussed/Used     Consult Status Consult Status: PRN    Lenard Forth 01/05/2013, 10:36 AM

## 2013-01-05 NOTE — Discharge Summary (Signed)
Obstetric Discharge Summary Michaela Moran is a 32 y.o. [id number] presenting at [redacted]w[redacted]d for IOL for post-dates. She had a prior c-section and desired TOLAC. She progressed to complete dilation and pushed for 3 hours and was taken for repeat cesarean section for arrest of descent and maternal exhaustion. The C-section was complicated by cystotomy. Bladder was repaired during surgery and foley catheter was left in place on discharge. Urine output and color were normal on day of discharge. Patient received training in care of leg bag and is schedule for follow up visit at [facility] on 4/25 for removal of foley. Her postpartum course was otherwise unremarkable. She is breastfeeding and plans Nexplanon for birth control.  Reason for Admission: induction of labor Prenatal Procedures: none Intrapartum Procedures: cesarean: low cervical, transverse Postpartum Procedures: none Complications-Operative and Postpartum: cystotomy, repaired Hemoglobin  Date Value Range Status  01/03/2013 9.2* 12.0 - 15.0 g/dL Final     HCT  Date Value Range Status  01/03/2013 26.9* 36.0 - 46.0 % Final    Physical Exam:  General: alert, cooperative and no distress Lochia: appropriate Uterine Fundus: firm Incision: clean, dry, intact - no significant drainage, redness or dehiscence. DVT Evaluation: no edema or tenderness, no sign of DVD  Discharge Diagnoses: Term Pregnancy-delivered and Failed induction, Failed TOLAC, s/p c-section and cystotomy  Discharge Information: Date: 01/05/2013 Activity: pelvic rest Diet: routine Medications: PNV, Ibuprofen, Colace, Iron and Percocet Condition: stable Instructions: refer to practice specific booklet Discharge to: home Follow-up Information   Follow up with [facility] In 4 days. (You should receive a call with an appointment date and time. If you do not hear by tomorrow, call the number above to schedule an appointment.)    Contact information:   [address] [place] [address] [phone]      Follow up with [facility] In 5 weeks. (For postpartum visit and discussion of Nexplanon placement)    Contact information:   [address] [place] [address] [phone]      Newborn Data: Live born female  Birth Weight: 9 lb 0.5 oz (4095 g) APGAR: 8, 9  Home with mother.  [name] 01/05/2013, 7:05 PM

## 2013-01-05 NOTE — Discharge Summary (Signed)
Attestation of Attending Supervision of Advanced Practitioner (CNM/NP): Evaluation and management procedures were performed by the Advanced Practitioner under my supervision and collaboration.  I have reviewed the Advanced Practitioner's note and chart, and I agree with the management and plan.  Jenell Dobransky 01/05/2013 7:33 PM

## 2013-01-06 ENCOUNTER — Inpatient Hospital Stay (HOSPITAL_COMMUNITY)
Admission: AD | Admit: 2013-01-06 | Discharge: 2013-01-06 | Disposition: A | Discharge status: Home / Self Care | Payer: Medicaid Other | Source: Ambulatory Visit | Attending: Obstetrics & Gynecology | Admitting: Obstetrics & Gynecology

## 2013-01-06 DIAGNOSIS — N390 Urinary tract infection, site not specified: Secondary | ICD-10-CM

## 2013-01-06 DIAGNOSIS — G8918 Other acute postprocedural pain: Secondary | ICD-10-CM

## 2013-01-06 DIAGNOSIS — IMO0002 Reserved for concepts with insufficient information to code with codable children: Secondary | ICD-10-CM | POA: Insufficient documentation

## 2013-01-06 DIAGNOSIS — O2390 Unspecified genitourinary tract infection in pregnancy, unspecified trimester: Secondary | ICD-10-CM | POA: Insufficient documentation

## 2013-01-06 LAB — CBC WITH DIFFERENTIAL/PLATELET
Basophils Absolute: 0 10*3/uL (ref 0.0–0.1)
Eosinophils Absolute: 0.4 10*3/uL (ref 0.0–0.7)
MCH: 30.4 pg (ref 26.0–34.0)
MCHC: 34.4 g/dL (ref 30.0–36.0)
Monocytes Absolute: 0.6 10*3/uL (ref 0.1–1.0)
Neutrophils Relative %: 69 % (ref 43–77)
Platelets: 356 10*3/uL (ref 150–400)
RDW: 14 % (ref 11.5–15.5)

## 2013-01-06 LAB — COMPREHENSIVE METABOLIC PANEL
ALT: 12 U/L (ref 0–35)
AST: 16 U/L (ref 0–37)
Calcium: 8 mg/dL — ABNORMAL LOW (ref 8.4–10.5)
GFR calc Af Amer: >90 mL/min (ref >90)
Glucose, Bld: 99 mg/dL (ref 70–99)
Sodium: 141 mEq/L (ref 135–145)
Total Protein: 5 g/dL — ABNORMAL LOW (ref 6.0–8.3)

## 2013-01-06 LAB — URINE MICROSCOPIC-ADD ON

## 2013-01-06 LAB — URINALYSIS, ROUTINE W REFLEX MICROSCOPIC
Bilirubin Urine: NEGATIVE
Ketones, ur: NEGATIVE mg/dL
Nitrite: POSITIVE — AB
Urobilinogen, UA: 1 mg/dL (ref 0.0–1.0)

## 2013-01-06 MED ORDER — OXYCODONE-ACETAMINOPHEN 5-325 MG PO TABS: 1.0000 | ORAL_TABLET | ORAL | Status: DC | PRN

## 2013-01-06 MED ORDER — OXYCODONE-ACETAMINOPHEN 5-325 MG PO TABS
2.0000 | ORAL_TABLET | Freq: Once | ORAL | Status: AC
  Administered 2013-01-06: 2 via ORAL
  Filled 2013-01-06: qty 2

## 2013-01-06 MED ORDER — CEPHALEXIN 500 MG PO CAPS: 500.0000 mg | ORAL_CAPSULE | Freq: Three times a day (TID) | ORAL | Status: DC

## 2013-01-06 NOTE — MAU Provider Note (Signed)
Attestation of Attending Supervision of Obstetric Fellow: Evaluation and management procedures were performed by the Obstetric Fellow under my supervision and collaboration.  I have reviewed the Obstetric Fellow's note and chart, and I agree with the management and plan.  Surina Storts, MD, FACOG Attending Obstetrician & Gynecologist Faculty Practice, [facility]   

## 2013-01-06 NOTE — MAU Provider Note (Signed)
History     CSN: [id number]  Arrival date and time: 01/06/13 1648   None     Chief Complaint  Patient presents with  . Leg Swelling  . Dysuria   HPI 32 y.o. [id number] s/p C-section with cystotomy on 01/02/13. Went home with foley and leg bag due to bladder repair. Appt Friday for removal.  States she has a lot of pain/pressure in lower abdomen/bladder. Feels like she needs to void and that foley is not draining well.  Is drinking a lot of fluid and amount of urine out does not seem to be enough. C/o bilateral leg swelling and headache today and sharp pain around c-section incision. States only taking ibuprofen, did not pick up percocet. No nausea/vomiting. No RUQ pain. No fever but some chills.  OB History   Grav Para Term Preterm Abortions TAB SAB Ect Mult Living   2 2 2       2       Past Medical History  Diagnosis Date  . Acid reflux   . Hemorrhoid   . 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 Female circumcision     Past Surgical History  Procedure Laterality Date  . Cesarean section    . Cesarean section N/A 01/02/2013    Procedure: CESAREAN SECTION;  Surgeon: Reva Bores, MD;  Location: [facility];  Service: Obstetrics;  Laterality: N/A;  Incidental Cyystotomy    Family History  Problem Relation Age of Onset  . Diabetes Mother   . Diabetes Sister   . Diabetes Brother   . Hypertension Brother   . Other Neg Hx     History  Substance Use Topics  . Smoking status: Never Smoker   . Smokeless tobacco: Never Used  . Alcohol Use: No    Allergies:  Allergies  Allergen Reactions  . Other Swelling    Allergic to peas.    Prescriptions prior to admission  Medication Sig Dispense Refill  . ferrous sulfate (FERROUSUL) 325 (65 FE) MG tablet Take 1 tablet (325 mg total) by mouth 2 (two) times daily.  60 tablet  3  . ibuprofen (ADVIL,MOTRIN) 600 MG tablet Take 1 tablet (600 mg total) by mouth every 6 (six) hours.  30 tablet  1  . senna-docusate (SENOKOT-S) 8.6-50 MG per  tablet Take 2 tablets by mouth at bedtime.  60 tablet  1  . oxyCODONE-acetaminophen (PERCOCET/ROXICET) 5-325 MG per tablet Take 1-2 tablets by mouth every 4 (four) hours as needed.  30 tablet  0  . [DISCONTINUED] Elastic Bandages & Supports (MEDICAL COMPRESSION STOCKINGS) MISC 1 Device by Does not apply route daily.  1 each  2  . [DISCONTINUED] Prenatal Vit-Fe Fumarate-FA (PRENATAL MULTIVITAMIN) TABS Take 1 tablet by mouth daily at 12 noon.  30 tablet  11    ROS Pertinent positives and negatives stated in HPI.  Physical Exam   Blood pressure 103/75, pulse 84, temperature 98.4 F (36.9 C), temperature source Oral, resp. rate 18, last menstrual period 03/31/2012, unknown if currently breastfeeding.  Filed Vitals:   01/06/13 1855  BP: 115/72  Pulse: 75  Temp:   Resp: 16     Physical Exam  Constitutional: She is oriented to person, place, and time. She appears well-developed and well-nourished. No distress.  HENT:  Head: Normocephalic and atraumatic.  Eyes: EOM are normal.  Neck: Normal range of motion. Neck supple.  Cardiovascular: Normal rate.   Respiratory: Effort normal. No respiratory distress.  GI: Soft.  There is tenderness (over incision, suprapubic). There is no rebound and no guarding.  Incision clean, dry,intact - dressing in place  Musculoskeletal: She exhibits edema (2+ pitting bilateral lower extremities).  Neurological: She is alert and oriented to person, place, and time.  Skin: Skin is warm and dry.  Psychiatric: She has a normal mood and affect.   Leg bag full, dark yellow urine, urine smell fairly strong.  MAU Course  Procedures  Bladder scan:  120 ml  Per RN - foley draining well when bag emptied. Percocet given in ED - pain resolved  Results for orders placed during the hospital encounter of 01/06/13 (from the past 24 hour(s))  CBC WITH DIFFERENTIAL     Status: Abnormal   Collection Time    01/06/13  5:50 PM      Result Value Range   WBC 8.9  4.0 -  10.5 K/uL   RBC 2.73 (*) 3.87 - 5.11 MIL/uL   Hemoglobin 8.3 (*) 12.0 - 15.0 g/dL   HCT 40.9 (*) 81.1 - 91.4 %   MCV 88.3  78.0 - 100.0 fL   MCH 30.4  26.0 - 34.0 pg   MCHC 34.4  30.0 - 36.0 g/dL   RDW 78.2  95.6 - 21.3 %   Platelets 356  150 - 400 K/uL   Neutrophils Relative 69  43 - 77 %   Lymphocytes Relative 20  12 - 46 %   Monocytes Relative 7  3 - 12 %   Eosinophils Relative 4  0 - 5 %   Basophils Relative 0  0 - 1 %   Neutro Abs 6.1  1.7 - 7.7 K/uL   Lymphs Abs 1.8  0.7 - 4.0 K/uL   Monocytes Absolute 0.6  0.1 - 1.0 K/uL   Eosinophils Absolute 0.4  0.0 - 0.7 K/uL   Basophils Absolute 0.0  0.0 - 0.1 K/uL   WBC Morphology MILD LEFT SHIFT (1-5% METAS, OCC MYELO, OCC BANDS)    COMPREHENSIVE METABOLIC PANEL     Status: Abnormal   Collection Time    01/06/13  5:50 PM      Result Value Range   Sodium 141  135 - 145 mEq/L   Potassium 3.2 (*) 3.5 - 5.1 mEq/L   Chloride 107  96 - 112 mEq/L   CO2 25  19 - 32 mEq/L   Glucose, Bld 99  70 - 99 mg/dL   BUN 10  6 - 23 mg/dL   Creatinine, Ser 0.86  0.50 - 1.10 mg/dL   Calcium 8.0 (*) 8.4 - 10.5 mg/dL   Total Protein 5.0 (*) 6.0 - 8.3 g/dL   Albumin 1.9 (*) 3.5 - 5.2 g/dL   AST 16  0 - 37 U/L   ALT 12  0 - 35 U/L   Alkaline Phosphatase 136 (*) 39 - 117 U/L   Total Bilirubin 0.3  0.3 - 1.2 mg/dL   GFR calc non Af Amer >90  >90 mL/min   GFR calc Af Amer >90  >90 mL/min  URINALYSIS, ROUTINE W REFLEX MICROSCOPIC     Status: Abnormal   Collection Time    01/06/13  5:55 PM      Result Value Range   Color, Urine YELLOW  YELLOW   APPearance HAZY (*) CLEAR   Specific Gravity, Urine 1.015  1.005 - 1.030   pH 7.5  5.0 - 8.0   Glucose, UA NEGATIVE  NEGATIVE mg/dL   Hgb urine dipstick LARGE (*) NEGATIVE  Bilirubin Urine NEGATIVE  NEGATIVE   Ketones, ur NEGATIVE  NEGATIVE mg/dL   Protein, ur NEGATIVE  NEGATIVE mg/dL   Urobilinogen, UA 1.0  0.0 - 1.0 mg/dL   Nitrite POSITIVE (*) NEGATIVE   Leukocytes, UA LARGE (*) NEGATIVE  URINE  MICROSCOPIC-ADD ON     Status: Abnormal   Collection Time    01/06/13  5:55 PM      Result Value Range   Squamous Epithelial / LPF FEW (*) RARE   WBC, UA 21-50  <3 WBC/hpf   RBC / HPF 21-50  <3 RBC/hpf   Bacteria, UA MANY (*) RARE      Assessment and Plan  32 y.o. [id number] s/p c-section with cystotomy and in-dwelling foley x 4 days. - UTI - Kelfex.  Reeducated regarding frequent emptying of leg bag so urine not retained in bladder - Incisional pain - likely due to inadequate treatment. Relief from percocet in ED, given rx for home pain treatment.  - F/U in [facility] on 4/25 as scheduled  [name] 01/06/2013, 5:52 PM

## 2013-01-06 NOTE — MAU Note (Signed)
States she feel like the catheter is not draining correctly. States she feels bladder is full. On inspection, there is urine in leg bag and no kinks noted in tubing.

## 2013-01-06 NOTE — MAU Note (Signed)
Patient delivered by cesarean section on 4-18 and has a foley catheter still in place. Husband states the catheter does not seem to be draining correctly and she is having swelling in her feet.

## 2013-01-08 LAB — URINE CULTURE

## 2013-01-09 ENCOUNTER — Ambulatory Visit: Payer: Medicaid Other | Admitting: Obstetrics & Gynecology

## 2013-01-09 ENCOUNTER — Encounter: Payer: Self-pay | Admitting: Obstetrics & Gynecology

## 2013-01-09 VITALS — BP 125/84 | HR 62 | Temp 97.0°F | Ht 66.0 in | Wt 184.8 lb

## 2013-01-09 DIAGNOSIS — Z09 Encounter for follow-up examination after completed treatment for conditions other than malignant neoplasm: Secondary | ICD-10-CM

## 2013-01-09 MED ORDER — OXYCODONE-ACETAMINOPHEN 5-325 MG PO TABS: 1.0000 | ORAL_TABLET | ORAL | Status: DC | PRN

## 2013-01-09 MED ORDER — IBUPROFEN 800 MG PO TABS: 800.0000 mg | ORAL_TABLET | Freq: Three times a day (TID) | ORAL | Status: DC | PRN

## 2013-01-09 NOTE — Progress Notes (Unsigned)
  Subjective:    Patient ID: Michaela Moran, female    DOB: 1981/04/21, 32 y.o.   MRN: [id number]  HPI  [name] is now a week post op s/p C/S and incidental cystotomy. She is here today for Foley removal. She would like a refill of percocet. Her pharmacy was called, and she has only filled on prescription for 30 percocets. She reports a BM 2 days ago and feel somewhat constipated. She denies fever or chills.   Review of Systems     Objective:   Physical Exam  I removed her Foley and honeycomb dressing. Her incision is healing well      Assessment & Plan:  Post op- stable I will give her #30 more percocet but also IBU #60. I have explained that percocet will constipate her. Recommend Miralax daily.

## 2013-01-09 NOTE — Progress Notes (Unsigned)
Here for post op. States still having some pain and daughter flushed pain medicine.  Needs refill. Still having problems with gas and constipation

## 2013-02-06 ENCOUNTER — Other Ambulatory Visit: Payer: Self-pay | Admitting: Obstetrics & Gynecology

## 2013-02-06 ENCOUNTER — Ambulatory Visit (INDEPENDENT_AMBULATORY_CARE_PROVIDER_SITE_OTHER): Payer: Medicaid Other | Admitting: Obstetrics & Gynecology

## 2013-02-06 ENCOUNTER — Encounter: Payer: Self-pay | Admitting: *Deleted

## 2013-02-06 ENCOUNTER — Encounter: Payer: Self-pay | Admitting: Obstetrics & Gynecology

## 2013-02-06 VITALS — BP 108/73 | HR 74 | Temp 97.2°F | Ht 67.0 in | Wt 165.0 lb

## 2013-02-06 DIAGNOSIS — Z309 Encounter for contraceptive management, unspecified: Secondary | ICD-10-CM

## 2013-02-06 DIAGNOSIS — Z30017 Encounter for initial prescription of implantable subdermal contraceptive: Secondary | ICD-10-CM

## 2013-02-06 LAB — POCT URINALYSIS DIP (DEVICE)
Glucose, UA: NEGATIVE mg/dL
Ketones, ur: NEGATIVE mg/dL
Protein, ur: NEGATIVE mg/dL
Specific Gravity, Urine: 1.02 (ref 1.005–1.030)

## 2013-02-06 LAB — POCT PREGNANCY, URINE: Preg Test, Ur: NEGATIVE

## 2013-02-06 MED ORDER — ETONOGESTREL 68 MG ~~LOC~~ IMPL
68.0000 mg | DRUG_IMPLANT | Freq: Once | SUBCUTANEOUS | Status: AC
  Administered 2013-02-06: 68 mg via SUBCUTANEOUS

## 2013-02-06 NOTE — Patient Instructions (Signed)

## 2013-02-06 NOTE — Progress Notes (Signed)
Patient ID: Michaela Moran, female   DOB: 1980-11-07, 32 y.o.   MRN: [id number] Subjective:     Michaela Moran is a 32 y.o. female who presents for a postpartum visit. She is 6 weeks postpartum following a low cervical transverse Cesarean section. I have fully reviewed the prenatal and intrapartum course. The delivery was at term. Outcome: repeat cesarean section, low transverse incision and VBAC attempted. Anesthesia: epidural. Postpartum course has been uncomplicated.  Pt does complain of continued pain. Baby's course has been uncomplicated. Baby is feeding by breast. Bleeding no bleeding. Bowel function is normal. Bladder function is normal. Patient is not sexually active. Contraception method is abstinence. Postpartum depression screening: negative.  The following portions of the patient's history were reviewed and updated as appropriate: allergies, current medications, past family history, past medical history, past social history, past surgical history and problem list.  Review of Systems Pertinent items are noted in HPI.   Objective:    BP 108/73  Pulse 74  Temp(Src) 97.2 F (36.2 C) (Oral)  Ht 5\' 7"  (1.702 m)  Wt 165 lb (74.844 kg)  BMI 25.84 kg/m2  Breastfeeding? Yes  General:  alert and no distress           Abdomen: soft, non-tender; bowel sounds normal; no masses,  no organomegaly incision well healed  Patient given informed consent, she signed consent form. Pregnancy test was negative.  Appropriate time out taken.  Patient's left arm was prepped and draped in the usual sterile fashion.. The ruler used to measure and mark insertion area.  Patient was prepped with alcohol swab and then injected with 5 ml of 1 % lidocaine.  She was prepped with betadine, Nexplanon removed from packaging,  Device confirmed in needle, then inserted full length of needle and withdrawn per handbook instructions.  There was minimal blood loss.  Patient insertion site covered with guaze and a pressure bandage to  reduce any bruising.  The patient tolerated the procedure well and was given post procedure instructions.   Assessment:     5-6weeks postpartum exam. Pap smear not done at today's visit.   Plan:    1. Contraception: Nexplanon 2. F/u at HD for PAP  3. Follow up in: 4 weeks or as needed.

## 2013-03-06 ENCOUNTER — Ambulatory Visit: Payer: Medicaid Other | Admitting: Obstetrics & Gynecology

## 2013-04-13 ENCOUNTER — Encounter (HOSPITAL_COMMUNITY): Payer: Self-pay | Admitting: *Deleted

## 2013-04-13 ENCOUNTER — Emergency Department (INDEPENDENT_AMBULATORY_CARE_PROVIDER_SITE_OTHER)
Admission: EM | Admit: 2013-04-13 | Discharge: 2013-04-13 | Disposition: A | Discharge status: Home / Self Care | Payer: Medicaid Other | Source: Home / Self Care

## 2013-04-13 DIAGNOSIS — M549 Dorsalgia, unspecified: Secondary | ICD-10-CM

## 2013-04-13 MED ORDER — IBUPROFEN 800 MG PO TABS: 800.0000 mg | ORAL_TABLET | Freq: Three times a day (TID) | ORAL | Status: DC | PRN
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
## 2013-05-25 ENCOUNTER — Other Ambulatory Visit: Payer: Self-pay | Admitting: Nurse Practitioner

## 2013-05-25 DIAGNOSIS — N63 Unspecified lump in unspecified breast: Secondary | ICD-10-CM

## 2013-05-29 ENCOUNTER — Ambulatory Visit
Admission: RE | Admit: 2013-05-29 | Discharge: 2013-05-29 | Disposition: A | Discharge status: Home / Self Care | Payer: Medicaid Other | Source: Ambulatory Visit | Attending: Nurse Practitioner | Admitting: Nurse Practitioner

## 2013-05-29 DIAGNOSIS — N63 Unspecified lump in unspecified breast: Secondary | ICD-10-CM

## 2013-08-31 ENCOUNTER — Encounter (HOSPITAL_COMMUNITY): Payer: Self-pay | Admitting: Emergency Medicine

## 2013-08-31 DIAGNOSIS — Z8719 Personal history of other diseases of the digestive system: Secondary | ICD-10-CM | POA: Insufficient documentation

## 2013-08-31 DIAGNOSIS — K219 Gastro-esophageal reflux disease without esophagitis: Secondary | ICD-10-CM | POA: Insufficient documentation

## 2013-08-31 DIAGNOSIS — Z3202 Encounter for pregnancy test, result negative: Secondary | ICD-10-CM | POA: Insufficient documentation

## 2013-08-31 DIAGNOSIS — R42 Dizziness and giddiness: Secondary | ICD-10-CM | POA: Insufficient documentation

## 2013-08-31 DIAGNOSIS — Z8611 Personal history of tuberculosis: Secondary | ICD-10-CM | POA: Insufficient documentation

## 2013-08-31 DIAGNOSIS — R5381 Other malaise: Secondary | ICD-10-CM | POA: Insufficient documentation

## 2013-08-31 DIAGNOSIS — R631 Polydipsia: Secondary | ICD-10-CM | POA: Insufficient documentation

## 2013-08-31 DIAGNOSIS — R51 Headache: Secondary | ICD-10-CM | POA: Insufficient documentation

## 2013-08-31 LAB — CBC WITH DIFFERENTIAL/PLATELET
Hemoglobin: 13.8 g/dL (ref 12.0–15.0)
Lymphocytes Relative: 44 % (ref 12–46)
Lymphs Abs: 2.8 10*3/uL (ref 0.7–4.0)
Monocytes Relative: 6 % (ref 3–12)
Neutro Abs: 3 10*3/uL (ref 1.7–7.7)
Neutrophils Relative %: 47 % (ref 43–77)
RBC: 4.3 MIL/uL (ref 3.87–5.11)

## 2013-08-31 LAB — URINALYSIS, ROUTINE W REFLEX MICROSCOPIC
Bilirubin Urine: NEGATIVE
Glucose, UA: NEGATIVE mg/dL
Ketones, ur: NEGATIVE mg/dL
Leukocytes, UA: NEGATIVE
Nitrite: NEGATIVE
Protein, ur: NEGATIVE mg/dL

## 2013-08-31 LAB — COMPREHENSIVE METABOLIC PANEL
Alkaline Phosphatase: 87 U/L (ref 39–117)
BUN: 16 mg/dL (ref 6–23)
Chloride: 101 mEq/L (ref 96–112)
GFR calc Af Amer: >90 mL/min (ref >90)
Glucose, Bld: 83 mg/dL (ref 70–99)
Potassium: 3.8 mEq/L (ref 3.5–5.1)
Total Bilirubin: 0.2 mg/dL — ABNORMAL LOW (ref 0.3–1.2)

## 2013-08-31 LAB — GLUCOSE, CAPILLARY: Glucose-Capillary: 91 mg/dL (ref 70–99)

## 2013-08-31 NOTE — ED Notes (Signed)
Pt. reports dizziness , headache and excessive thirst onset this morning .

## 2013-09-01 ENCOUNTER — Encounter (HOSPITAL_COMMUNITY): Payer: Self-pay | Admitting: Emergency Medicine

## 2013-09-01 ENCOUNTER — Emergency Department (HOSPITAL_COMMUNITY)
Admission: EM | Admit: 2013-09-01 | Discharge: 2013-09-01 | Discharge status: Against Medical Advice | Payer: Medicaid Other | Attending: Emergency Medicine | Admitting: Emergency Medicine

## 2013-09-01 NOTE — ED Notes (Signed)
Patient and husband unable to stay due to child care issues, notify ER doctor d yelverton, of situation.

## 2013-09-05 NOTE — ED Provider Notes (Signed)
CSN: [id number]     Arrival date & time 08/31/13  2251 History   First MD Initiated Contact with Patient 09/01/13 0300     Chief Complaint  Patient presents with  . Dizziness   (Consider location/radiation/quality/duration/timing/severity/associated sxs/prior Treatment) HPI Patient presents with several vague complaints. She states the symptoms started earlier today. She complains of a generalized gradual onset headache, lightheadedness, excessive thirst and fatigue since this morning. She denies any nausea vomiting or diarrhea. She has no neck pain or stiffness. She denies any chest pain or shortness of breath. Has no urinary frequency, dysuria or hematuria. Denies any vaginal bleeding or discharge. She has no focal weakness or numbness. She has no vision changes. The dizziness she describes as lightheadedness especially worse when sitting up or standing. Past Medical History  Diagnosis Date  . Acid reflux   . Hemorrhoid   . 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 Female circumcision    Past Surgical History  Procedure Laterality Date  . Cesarean section    . Cesarean section N/A 01/02/2013    Procedure: CESAREAN SECTION;  Surgeon: Reva Bores, MD;  Location: [facility];  Service: Obstetrics;  Laterality: N/A;  Incidental Cyystotomy   Family History  Problem Relation Age of Onset  . Diabetes Mother   . Diabetes Sister   . Diabetes Brother   . Hypertension Brother   . Other Neg Hx    History  Substance Use Topics  . Smoking status: Never Smoker   . Smokeless tobacco: Never Used  . Alcohol Use: No   OB History   Grav Para Term Preterm Abortions TAB SAB Ect Mult Living   2 2 2       2      Review of Systems  Constitutional: Positive for fatigue. Negative for fever and chills.  HENT: Negative for congestion, ear pain, facial swelling, sinus pressure and sore throat.   Eyes: Negative for visual disturbance.  Respiratory: Negative for cough and shortness of breath.    Cardiovascular: Negative for chest pain, palpitations and leg swelling.  Gastrointestinal: Negative for nausea, vomiting, abdominal pain and diarrhea.  Genitourinary: Negative for dysuria, frequency, flank pain, vaginal bleeding and vaginal discharge.  Musculoskeletal: Negative for back pain, myalgias, neck pain and neck stiffness.  Skin: Negative for pallor, rash and wound.  Neurological: Positive for dizziness, light-headedness and headaches. Negative for tremors, syncope, weakness and numbness.  All other systems reviewed and are negative.    Allergies  Other  Home Medications   Current Outpatient Rx  Name  Route  Sig  Dispense  Refill  . acetaminophen (TYLENOL) 325 MG tablet   Oral   Take 325 mg by mouth every 6 (six) hours as needed for mild pain, moderate pain or headache.          BP 115/80  Pulse 66  Temp(Src) 98 F (36.7 C) (Oral)  Resp 18  Ht 5\' 7"  (1.702 m)  Wt 185 lb 9 oz (84.171 kg)  BMI 29.06 kg/m2  SpO2 99%  Breastfeeding? No Physical Exam  Nursing note and vitals reviewed. Constitutional: She is oriented to person, place, and time. She appears well-developed and well-nourished. No distress.  HENT:  Head: Normocephalic and atraumatic.  Right Ear: External ear normal.  Left Ear: External ear normal.  Mouth/Throat: Oropharynx is clear and moist. No oropharyngeal exudate.  No sinus tenderness. Bilateral TMs normal.  Eyes: EOM are normal. Pupils are equal, round, and reactive to  light.  Neck: Normal range of motion. Neck supple. No thyromegaly present.  No meningismus  Cardiovascular: Normal rate and regular rhythm.  Exam reveals no gallop and no friction rub.   No murmur heard. Pulmonary/Chest: Effort normal and breath sounds normal. No respiratory distress. She has no wheezes. She has no rales. She exhibits no tenderness.  Abdominal: Soft. Bowel sounds are normal. She exhibits no distension and no mass. There is no tenderness. There is no rebound and no  guarding.  Musculoskeletal: Normal range of motion. She exhibits no edema and no tenderness.  No calf swelling or tenderness.  Neurological: She is alert and oriented to person, place, and time.  Patient is alert and oriented x3 with clear, goal oriented speech. Patient has 5/5 motor in all extremities. Sensation is intact to light touch. Bilateral finger-to-nose is normal with no signs of dysmetria. Patient has a normal gait and walks without assistance.   Skin: Skin is warm and dry. No rash noted. No erythema.  Psychiatric: She has a normal mood and affect. Her behavior is normal.    ED Course  Procedures (including critical care time) Labs Review Labs Reviewed  COMPREHENSIVE METABOLIC PANEL - Abnormal; Notable for the following:    Total Bilirubin 0.2 (*)    All other components within normal limits  URINALYSIS, ROUTINE W REFLEX MICROSCOPIC - Abnormal; Notable for the following:    Specific Gravity, Urine 1.004 (*)    All other components within normal limits  GLUCOSE, CAPILLARY  CBC WITH DIFFERENTIAL  POCT PREGNANCY, URINE   Imaging Review No results found.  EKG Interpretation   None       MDM  Patient screen for [facility]. Vital signs, workup and exam are normal. Recommend followup with primary Dr. Return precautions given.    Loren Racer, MD 09/05/13 [phone]

## 2013-10-01 ENCOUNTER — Ambulatory Visit: Payer: Medicaid Other | Admitting: Internal Medicine

## 2013-10-02 ENCOUNTER — Encounter: Payer: Self-pay | Admitting: Internal Medicine

## 2013-10-02 ENCOUNTER — Ambulatory Visit: Payer: Medicaid Other | Attending: Internal Medicine | Admitting: Internal Medicine

## 2013-10-02 VITALS — BP 128/78 | HR 100 | Temp 98.3°F | Resp 17 | Wt 185.2 lb

## 2013-10-02 DIAGNOSIS — H9209 Otalgia, unspecified ear: Secondary | ICD-10-CM

## 2013-10-02 DIAGNOSIS — R059 Cough, unspecified: Secondary | ICD-10-CM | POA: Insufficient documentation

## 2013-10-02 DIAGNOSIS — Z139 Encounter for screening, unspecified: Secondary | ICD-10-CM

## 2013-10-02 DIAGNOSIS — J329 Chronic sinusitis, unspecified: Secondary | ICD-10-CM

## 2013-10-02 DIAGNOSIS — R5381 Other malaise: Secondary | ICD-10-CM

## 2013-10-02 DIAGNOSIS — J3489 Other specified disorders of nose and nasal sinuses: Secondary | ICD-10-CM

## 2013-10-02 DIAGNOSIS — R5383 Other fatigue: Secondary | ICD-10-CM

## 2013-10-02 DIAGNOSIS — H9202 Otalgia, left ear: Secondary | ICD-10-CM

## 2013-10-02 DIAGNOSIS — R05 Cough: Secondary | ICD-10-CM

## 2013-10-02 DIAGNOSIS — R0981 Nasal congestion: Secondary | ICD-10-CM

## 2013-10-02 DIAGNOSIS — J029 Acute pharyngitis, unspecified: Secondary | ICD-10-CM

## 2013-10-02 DIAGNOSIS — E119 Type 2 diabetes mellitus without complications: Secondary | ICD-10-CM

## 2013-10-02 LAB — COMPLETE METABOLIC PANEL WITH GFR
ALBUMIN: 3.9 g/dL (ref 3.5–5.2)
ALT: 16 U/L (ref 0–35)
AST: 16 U/L (ref 0–37)
Alkaline Phosphatase: 80 U/L (ref 39–117)
BUN: 11 mg/dL (ref 6–23)
CALCIUM: 9.2 mg/dL (ref 8.4–10.5)
CHLORIDE: 103 meq/L (ref 96–112)
CO2: 29 mEq/L (ref 19–32)
Creat: 0.68 mg/dL (ref 0.50–1.10)
GFR, Est African American: >89 mL/min
Glucose, Bld: 68 mg/dL — ABNORMAL LOW (ref 70–99)
POTASSIUM: 4 meq/L (ref 3.5–5.3)
Sodium: 139 mEq/L (ref 135–145)
Total Bilirubin: 0.5 mg/dL (ref 0.3–1.2)
Total Protein: 6.8 g/dL (ref 6.0–8.3)

## 2013-10-02 LAB — CBC WITH DIFFERENTIAL/PLATELET
BASOS PCT: 1 % (ref 0–1)
Basophils Absolute: 0 10*3/uL (ref 0.0–0.1)
EOS ABS: 0.1 10*3/uL (ref 0.0–0.7)
Eosinophils Relative: 1 % (ref 0–5)
HEMATOCRIT: 40.5 % (ref 36.0–46.0)
HEMOGLOBIN: 13.9 g/dL (ref 12.0–15.0)
Lymphocytes Relative: 24 % (ref 12–46)
Lymphs Abs: 2 10*3/uL (ref 0.7–4.0)
MCH: 30.5 pg (ref 26.0–34.0)
MCHC: 34.3 g/dL (ref 30.0–36.0)
MCV: 89 fL (ref 78.0–100.0)
MONO ABS: 0.7 10*3/uL (ref 0.1–1.0)
MONOS PCT: 8 % (ref 3–12)
NEUTROS PCT: 66 % (ref 43–77)
Neutro Abs: 5.4 10*3/uL (ref 1.7–7.7)
Platelets: 345 10*3/uL (ref 150–400)
RBC: 4.55 MIL/uL (ref 3.87–5.11)
RDW: 13.4 % (ref 11.5–15.5)
WBC: 8.1 10*3/uL (ref 4.0–10.5)

## 2013-10-02 LAB — GLUCOSE, POCT (MANUAL RESULT ENTRY): POC GLUCOSE: 110 mg/dL — AB (ref 70–99)

## 2013-10-02 LAB — LIPID PANEL
Cholesterol: 235 mg/dL — ABNORMAL HIGH (ref 0–200)
HDL: 49 mg/dL (ref >39)
LDL Cholesterol: 159 mg/dL — ABNORMAL HIGH (ref 0–99)
Total CHOL/HDL Ratio: 4.8 Ratio
Triglycerides: 135 mg/dL (ref <150)
VLDL: 27 mg/dL (ref 0–40)

## 2013-10-02 LAB — POCT GLYCOSYLATED HEMOGLOBIN (HGB A1C): HEMOGLOBIN A1C: 5.4

## 2013-10-02 LAB — POCT RAPID STREP A (OFFICE): Rapid Strep A Screen: NEGATIVE

## 2013-10-02 MED ORDER — FLUTICASONE PROPIONATE 50 MCG/ACT NA SUSP: 2.0000 | Freq: Every day | NASAL | Status: DC

## 2013-10-02 MED ORDER — GUAIFENESIN 100 MG/5ML PO SOLN: 5.0000 mL | ORAL | Status: DC | PRN

## 2013-10-02 MED ORDER — AZITHROMYCIN 250 MG PO TABS: ORAL_TABLET | ORAL | Status: DC

## 2013-10-02 NOTE — Progress Notes (Signed)
Patient Demographics  Michaela Moran, is a 33 y.o. female  TDD:[id number]  WCB:[id number]  DOB - [birth year]  CC:  Chief Complaint  Patient presents with  . Sore Throat  . Headache       HPI: Michaela Moran is a 33 y.o. female here today to establish medical care she complains of  headache fever chills myalgia stuffy nose productive cough sore throat bilateral ear pain for the last 3-4 days, she denies any chest pain or shortness of breath, she tried over-the-counter medication without much improvement, she denies any nausea vomiting or any bowel or bladder symptoms. Patient does not smoke cigarettes. Patient has her history of diabetes and is concerned and wants blood glucose to be checked.   Allergies  Allergen Reactions  . Other Swelling    Allergic to peas.   Past Medical History  Diagnosis Date  . Acid reflux   . Hemorrhoid   . 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 Female circumcision    Current Outpatient Prescriptions on File Prior to Visit  Medication Sig Dispense Refill  . acetaminophen (TYLENOL) 325 MG tablet Take 325 mg by mouth every 6 (six) hours as needed for mild pain, moderate pain or headache.      . [DISCONTINUED] ferrous sulfate (FERROUSUL) 325 (65 FE) MG tablet Take 1 tablet (325 mg total) by mouth 2 (two) times daily.  60 tablet  3   No current facility-administered medications on file prior to visit.   Family History  Problem Relation Age of Onset  . Diabetes Mother   . Diabetes Sister   . Diabetes Brother   . Hypertension Brother   . Other Neg Hx   . Stroke Maternal Aunt    History   Social History  . Marital Status: Married    Spouse Name: N/A    Number of Children: N/A  . Years of Education: N/A   Occupational History  . Not on file.   Social History Main Topics  . Smoking status: Never Smoker   . Smokeless tobacco: Never Used  . Alcohol Use: No  . Drug Use: No  . Sexual Activity: Yes    Birth Control/ Protection: None   Other Topics  Concern  . Not on file   Social History Narrative  . No narrative on file    Review of Systems: Constitutional: Negative for fever, +chills, diaphoresis, activity change, appetite change and +fatigue. HENT: Positive for ear pain, negative nosebleeds, congestion, facial swelling,+ rhinorrhea, neck pain, neck stiffness and ear discharge.  Eyes: Negative for pain, discharge, redness, itching and visual disturbance. Respiratory: Negative for cough, choking, chest tightness, shortness of breath, wheezing and stridor.  Cardiovascular: Negative for chest pain, palpitations and leg swelling. Gastrointestinal: Negative for abdominal distention. Genitourinary: Negative for dysuria, urgency, frequency, hematuria, flank pain, decreased urine volume, difficulty urinating and dyspareunia.  Musculoskeletal: Negative for back pain, joint swelling, arthralgia and gait problem. Neurological: Negative for dizziness, tremors, seizures, syncope, facial asymmetry, speech difficulty, weakness, light-headedness, numbness and headaches.  Hematological: Negative for adenopathy. Does not bruise/bleed easily. Psychiatric/Behavioral: Negative for hallucinations, behavioral problems, confusion, dysphoric mood, decreased concentration and agitation.    Objective:   Filed Vitals:   10/02/13 1500  BP: 128/78  Pulse: 100  Temp: 98.3 F (36.8 C)  Resp: 17    Physical Exam: Constitutional: Patient appears well-developed and well-nourished. No distress. HENT: Normocephalic, atraumatic, External right and left ear normal. Bilateral nasal congestion sinus tenderness, Oropharynx is clear  and moist minimal pharyngeal erythema no exhibit  Eyes: Conjunctivae and EOM are normal. PERRLA, no scleral icterus. Neck: Normal ROM. Neck supple. No JVD. No tracheal deviation. No thyromegaly. CVS: RRR, S1/S2 +, no murmurs, no gallops, no carotid bruit.  Pulmonary: Effort and breath sounds normal, no stridor, rhonchi, wheezes,  rales.  Musculoskeletal: Normal range of motion. No edema and no tenderness.  Lymphadenopathy: + lymphadenopathy cervical  Neuro: Alert. Normal reflexes, muscle tone coordination. No cranial nerve deficit. Skin: Skin is warm and dry. No rash noted. Not diaphoretic. No erythema. No pallor. Psychiatric: Normal mood and affect. Behavior, judgment, thought content normal.  Lab Results  Component Value Date   WBC 6.4 08/31/2013   HGB 13.8 08/31/2013   HCT 39.1 08/31/2013   MCV 90.9 08/31/2013   PLT 283 08/31/2013   Lab Results  Component Value Date   CREATININE 0.81 08/31/2013   BUN 16 08/31/2013   NA 137 08/31/2013   K 3.8 08/31/2013   CL 101 08/31/2013   CO2 26 08/31/2013    Lab Results  Component Value Date   HGBA1C 5.4 10/02/2013   Lipid Panel  No results found for this basename: chol, trig, hdl, cholhdl, vldl, ldlcalc       Assessment and plan:   1. Sore throat  - Rapid Strep A negative - Throat culture ([facility])  2. DM (diabetes mellitus) screening Patient has family history of diabetes. - Glucose (CBG) - HgB A1c 5.4%  3. Sinusitis  - azithromycin (ZITHROMAX Z-PAK) 250 MG tablet; Take as directed  Dispense: 6 each; Refill: 0  4. Cough  - guaiFENesin (ROBITUSSIN) 100 MG/5ML SOLN; Take 5 mLs (100 mg total) by mouth every 4 (four) hours as needed for cough or to loosen phlegm.  Dispense: 1200 mL; Refill: 0  5. Otalgia of left ear Tylenol when necessary  6. Other malaise and fatigue  - CBC with Differential - TSH  7. Screening  - COMPLETE METABOLIC PANEL WITH GFR - Lipid panel - Vit D  25 hydroxy (rtn osteoporosis monitoring)  8. Stuffy nose  - fluticasone (FLONASE) 50 MCG/ACT nasal spray; Place 2 sprays into both nostrils daily.  Dispense: 16 g; Refill: 3  Advised patient for saltwater gargles      Return in about 6 weeks (around 11/13/2013).   Lorayne Marek, MD

## 2013-10-02 NOTE — Progress Notes (Signed)
Patient complains of headache Sore throat' Left ear pain Fatigued DM runs in her family and would like her blood sugar check

## 2013-10-03 LAB — TSH: TSH: 2.243 u[IU]/mL (ref 0.350–4.500)

## 2013-10-03 LAB — VITAMIN D 25 HYDROXY (VIT D DEFICIENCY, FRACTURES): VIT D 25 HYDROXY: 19 ng/mL — AB (ref 30–89)

## 2013-10-04 LAB — CULTURE, GROUP A STREP: ORGANISM ID, BACTERIA: NORMAL

## 2013-10-05 ENCOUNTER — Telehealth: Payer: Self-pay

## 2013-10-05 NOTE — Telephone Encounter (Signed)
Message copied by Dorothe Pea on Mon Oct 05, 2013 11:51 AM ------      Message from: Lorayne Marek      Created: Mon Oct 05, 2013 10:15 AM       Blood work reviewed, noticed low vitamin D, call patient advise to start ergocalciferol 50,000 units once a week for the duration of  12 weeks.       noticed elevated cholesterol, advise patient for low fat diet.       ------

## 2013-10-05 NOTE — Telephone Encounter (Signed)
Message copied by Dorothe Pea on Mon Oct 05, 2013 11:53 AM ------      Message from: Lorayne Marek      Created: Mon Oct 05, 2013 10:15 AM       Blood work reviewed, noticed low vitamin D, call patient advise to start ergocalciferol 50,000 units once a week for the duration of  12 weeks.       noticed elevated cholesterol, advise patient for low fat diet.       ------

## 2013-10-05 NOTE — Telephone Encounter (Signed)
Interpreter line used Both numbers called Patient not available Unable to leave message

## 2013-11-13 ENCOUNTER — Ambulatory Visit: Payer: Medicaid Other | Admitting: Internal Medicine

## 2013-12-01 ENCOUNTER — Ambulatory Visit: Payer: Medicaid Other | Admitting: Internal Medicine

## 2014-04-21 ENCOUNTER — Emergency Department (HOSPITAL_COMMUNITY)
Admission: EM | Admit: 2014-04-21 | Discharge: 2014-04-22 | Disposition: A | Discharge status: Home / Self Care | Payer: Medicaid Other | Attending: Emergency Medicine | Admitting: Emergency Medicine

## 2014-04-21 ENCOUNTER — Encounter (HOSPITAL_COMMUNITY): Payer: Self-pay | Admitting: Emergency Medicine

## 2014-04-21 DIAGNOSIS — R1012 Left upper quadrant pain: Secondary | ICD-10-CM | POA: Diagnosis not present

## 2014-04-21 DIAGNOSIS — R195 Other fecal abnormalities: Secondary | ICD-10-CM

## 2014-04-21 DIAGNOSIS — K921 Melena: Secondary | ICD-10-CM | POA: Diagnosis not present

## 2014-04-21 DIAGNOSIS — R1011 Right upper quadrant pain: Secondary | ICD-10-CM

## 2014-04-21 DIAGNOSIS — K649 Unspecified hemorrhoids: Secondary | ICD-10-CM | POA: Insufficient documentation

## 2014-04-21 DIAGNOSIS — Z3202 Encounter for pregnancy test, result negative: Secondary | ICD-10-CM | POA: Insufficient documentation

## 2014-04-21 DIAGNOSIS — Z8639 Personal history of other endocrine, nutritional and metabolic disease: Secondary | ICD-10-CM | POA: Insufficient documentation

## 2014-04-21 DIAGNOSIS — Z862 Personal history of diseases of the blood and blood-forming organs and certain disorders involving the immune mechanism: Secondary | ICD-10-CM | POA: Insufficient documentation

## 2014-04-21 DIAGNOSIS — Z79899 Other long term (current) drug therapy: Secondary | ICD-10-CM | POA: Diagnosis not present

## 2014-04-21 HISTORY — DX: Pure hypercholesterolemia, unspecified: E78.00

## 2014-04-21 LAB — CBC WITH DIFFERENTIAL/PLATELET
BASOS ABS: 0 10*3/uL (ref 0.0–0.1)
Basophils Relative: 0 % (ref 0–1)
Eosinophils Absolute: 0.1 10*3/uL (ref 0.0–0.7)
Eosinophils Relative: 2 % (ref 0–5)
HCT: 40.1 % (ref 36.0–46.0)
Hemoglobin: 13.7 g/dL (ref 12.0–15.0)
LYMPHS ABS: 1.9 10*3/uL (ref 0.7–4.0)
Lymphocytes Relative: 40 % (ref 12–46)
MCH: 31.4 pg (ref 26.0–34.0)
MCHC: 34.2 g/dL (ref 30.0–36.0)
MCV: 92 fL (ref 78.0–100.0)
MONOS PCT: 5 % (ref 3–12)
Monocytes Absolute: 0.2 10*3/uL (ref 0.1–1.0)
NEUTROS ABS: 2.4 10*3/uL (ref 1.7–7.7)
Neutrophils Relative %: 53 % (ref 43–77)
Platelets: 273 10*3/uL (ref 150–400)
RBC: 4.36 MIL/uL (ref 3.87–5.11)
RDW: 13.1 % (ref 11.5–15.5)
WBC: 4.6 10*3/uL (ref 4.0–10.5)

## 2014-04-21 LAB — COMPREHENSIVE METABOLIC PANEL
ALT: 16 U/L (ref 0–35)
AST: 20 U/L (ref 0–37)
Albumin: 3.7 g/dL (ref 3.5–5.2)
Alkaline Phosphatase: 82 U/L (ref 39–117)
Anion gap: 13 (ref 5–15)
BUN: 8 mg/dL (ref 6–23)
CHLORIDE: 102 meq/L (ref 96–112)
CO2: 25 mEq/L (ref 19–32)
Calcium: 9 mg/dL (ref 8.4–10.5)
Creatinine, Ser: 0.63 mg/dL (ref 0.50–1.10)
GFR calc Af Amer: >90 mL/min (ref >90)
GFR calc non Af Amer: >90 mL/min (ref >90)
Glucose, Bld: 118 mg/dL — ABNORMAL HIGH (ref 70–99)
Potassium: 3.4 mEq/L — ABNORMAL LOW (ref 3.7–5.3)
Sodium: 140 mEq/L (ref 137–147)
Total Bilirubin: 0.4 mg/dL (ref 0.3–1.2)
Total Protein: 7.4 g/dL (ref 6.0–8.3)

## 2014-04-21 LAB — URINALYSIS, ROUTINE W REFLEX MICROSCOPIC
Bilirubin Urine: NEGATIVE
GLUCOSE, UA: NEGATIVE mg/dL
Hgb urine dipstick: NEGATIVE
Ketones, ur: NEGATIVE mg/dL
Leukocytes, UA: NEGATIVE
Nitrite: NEGATIVE
PROTEIN: NEGATIVE mg/dL
SPECIFIC GRAVITY, URINE: 1.015 (ref 1.005–1.030)
Urobilinogen, UA: 0.2 mg/dL (ref 0.0–1.0)
pH: 6.5 (ref 5.0–8.0)

## 2014-04-21 LAB — LIPASE, BLOOD: Lipase: 32 U/L (ref 11–59)

## 2014-04-21 LAB — I-STAT TROPONIN, ED: TROPONIN I, POC: 0 ng/mL (ref 0.00–0.08)

## 2014-04-21 MED ORDER — GI COCKTAIL ~~LOC~~
30.0000 mL | Freq: Once | ORAL | Status: AC
  Administered 2014-04-21: 30 mL via ORAL
  Filled 2014-04-21: qty 30

## 2014-04-21 NOTE — ED Notes (Signed)
Pt c/o LUQ abd pain that started today and hemorrhoids reports she wants to have her hemorrhoids check. Went to PCP about 2 weeks ago for the same. Denies n/v/d. sts she has had a few loose stools. Nad, skin warm and dry, resp e/u.

## 2014-04-21 NOTE — ED Notes (Addendum)
Occult stool sample positive. Dr. Estanislado Spire made aware.

## 2014-04-21 NOTE — ED Notes (Signed)
Dr. Nanavati at bedside 

## 2014-04-21 NOTE — ED Provider Notes (Signed)
CSN: [id number]     Arrival date & time 04/21/14  1633 History   First MD Initiated Contact with Patient 04/21/14 2041     Chief Complaint  Patient presents with  . Hemorrhoids  . Abdominal Pain     (Consider location/radiation/quality/duration/timing/severity/associated sxs/prior Treatment) Patient is a 33 y.o. female presenting with abdominal pain. The history is provided by the patient.  Abdominal Pain Pain location:  LUQ Pain quality: cramping and sharp   Pain radiates to:  Does not radiate Pain severity:  Moderate Onset quality:  Gradual Duration:  2 weeks Timing:  Intermittent Progression:  Unchanged Chronicity:  Chronic Context: not alcohol use, not awakening from sleep, not diet changes, not eating, not laxative use, not medication withdrawal, not previous surgeries, not recent illness, not recent sexual activity, not recent travel, not retching, not sick contacts, not suspicious food intake and not trauma   Relieved by:  None tried Worsened by:  Nothing tried Ineffective treatments:  None tried Associated symptoms: constipation   Associated symptoms: no anorexia, no belching, no chest pain, no chills, no cough, no diarrhea, no dysuria, no fatigue, no fever, no hematemesis, no hematochezia, no hematuria, no melena, no nausea, no shortness of breath, no sore throat, no vaginal bleeding, no vaginal discharge and no vomiting   Risk factors: no alcohol abuse, no aspirin use, not elderly, has not had multiple surgeries, no NSAID use, not obese, not pregnant and no recent hospitalization    Pt has hemorrhoids and associated constipation intermittently.  Thought hemorrhoids might be related.  No other significant associated symptoms.  Pt saw GI before about her symptoms, which have been chronic, but she is not sure of her diagnosis.  Endorses occasional pale, foul smelling, fatty stools.  No other significant findings.  Past Medical History  Diagnosis Date  . Acid reflux   .  Hemorrhoid   . 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 Female circumcision   . Hypercholesteremia    Past Surgical History  Procedure Laterality Date  . Cesarean section    . Cesarean section N/A 01/02/2013    Procedure: CESAREAN SECTION;  Surgeon: Donnamae Jude, MD;  Location: [facility];  Service: Obstetrics;  Laterality: N/A;  Incidental Cyystotomy   Family History  Problem Relation Age of Onset  . Diabetes Mother   . Diabetes Sister   . Diabetes Brother   . Hypertension Brother   . Other Neg Hx   . Stroke Maternal Aunt    History  Substance Use Topics  . Smoking status: Never Smoker   . Smokeless tobacco: Never Used  . Alcohol Use: No   OB History   Grav Para Term Preterm Abortions TAB SAB Ect Mult Living   2 2 2       2      Review of Systems  Constitutional: Negative.  Negative for fever, chills and fatigue.  HENT: Negative.  Negative for sore throat.   Eyes: Negative.   Respiratory: Negative.  Negative for cough and shortness of breath.   Cardiovascular: Negative.  Negative for chest pain.  Gastrointestinal: Positive for abdominal pain and constipation. Negative for nausea, vomiting, diarrhea, melena, hematochezia, anorexia and hematemesis.  Endocrine: Negative.   Genitourinary: Negative.  Negative for dysuria, hematuria, vaginal bleeding and vaginal discharge.  Musculoskeletal: Negative.   Skin: Negative.   Allergic/Immunologic: Negative.   Neurological: Negative.   Hematological: Negative.   Psychiatric/Behavioral: Negative.       Allergies  Other  Home Medications   Prior to Admission medications   Medication Sig Start Date End Date Taking? Authorizing Provider  Multiple Vitamin (MULTIVITAMIN WITH MINERALS) TABS tablet Take 1 tablet by mouth daily.   Yes Historical Provider, MD   BP 113/82  Pulse 64  Temp(Src) 97.8 F (36.6 C) (Oral)  Resp 20  Ht 5\' 6"  (1.676 m)  Wt 183 lb (83.008 kg)  BMI 29.55 kg/m2  SpO2 100% Physical Exam  Nursing note and  vitals reviewed. Constitutional: She is oriented to person, place, and time. She appears well-developed and well-nourished. No distress.  HENT:  Head: Normocephalic and atraumatic.  Right Ear: External ear normal.  Left Ear: External ear normal.  Nose: Nose normal.  Mouth/Throat: Oropharynx is clear and moist. No oropharyngeal exudate.  Eyes: Conjunctivae and EOM are normal. Pupils are equal, round, and reactive to light. Right eye exhibits no discharge. Left eye exhibits no discharge. No scleral icterus.  Neck: Normal range of motion. Neck supple. No JVD present. No tracheal deviation present. No thyromegaly present.  Cardiovascular: Normal rate, regular rhythm, normal heart sounds and intact distal pulses.  Exam reveals no gallop and no friction rub.   No murmur heard. Pulmonary/Chest: Effort normal and breath sounds normal. No stridor. No respiratory distress. She has no wheezes. She has no rales. She exhibits no tenderness.  Abdominal: Soft. She exhibits no distension. There is tenderness in the epigastric area and left upper quadrant. There is no rigidity, no rebound, no CVA tenderness, no tenderness at McBurney's point and negative Murphy's sign.  Mild pain with palpation to epigastrum and LUQ.  Tolerates deep palpation to LUQ.  Genitourinary: Rectal exam shows no external hemorrhoid, no internal hemorrhoid, no fissure, no mass, no tenderness and anal tone normal. Guaiac positive stool.  Musculoskeletal: Normal range of motion. She exhibits no edema and no tenderness.  Lymphadenopathy:    She has no cervical adenopathy.  Neurological: She is alert and oriented to person, place, and time.  Skin: Skin is warm and dry. No rash noted. She is not diaphoretic. No erythema. No pallor.  Psychiatric: She has a normal mood and affect. Her behavior is normal. Judgment and thought content normal.    ED Course  Procedures (including critical care time) Labs Review Labs Reviewed  COMPREHENSIVE  METABOLIC PANEL - Abnormal; Notable for the following:    Potassium 3.4 (*)    Glucose, Bld 118 (*)    All other components within normal limits  URINALYSIS, ROUTINE W REFLEX MICROSCOPIC - Abnormal; Notable for the following:    APPearance HAZY (*)    All other components within normal limits  POC OCCULT BLOOD, ED - Abnormal; Notable for the following:    Fecal Occult Bld POSITIVE (*)    All other components within normal limits  CBC WITH DIFFERENTIAL  LIPASE, BLOOD  PREGNANCY, URINE  I-STAT TROPOININ, ED    Imaging Review No results found.   EKG Interpretation None      MDM   Final diagnoses:  RUQ abdominal pain  Heme positive stool   Pt NAD, otherwise well appearing.  Will screen for pancreatitis.  Will give GI cocktail for GERD symptoms.  Possible malabsorption, but emergent dx appears less likely, since pt has had this pain for 3-4 months intermittently.    W/U is unremarkable.  Pt symptoms improved with GI cocktail.  Pt advised to go to her outpatient physician and follow up with GI for additional recommendations.  Patient and spouse given return precautions  for abdominal pain.  Advised to return for worsening symptoms including chest pain, shortness of breath, severe headache, intractable nausea or vomiting, fever, or chills, inability to take medications, or other acute concerns.  Advised to follow up with PCP in 2 days.  Patient and family in agreement with and expressed understanding of follow plan, plan of care, and return precautions.  All questions answered prior to discharge.  Patient was discharged in stable condition with family, ambulating without difficulty.   Patient care was discussed with my attending, Dr. Kathrynn Humble.    Hoyle Sauer, MD 04/22/14 2200

## 2014-04-22 LAB — PREGNANCY, URINE: PREG TEST UR: NEGATIVE

## 2014-04-22 LAB — POC OCCULT BLOOD, ED: Fecal Occult Bld: POSITIVE — AB

## 2014-04-22 IMAGING — US US OB DETAIL+14 WK
1 series · 12 of 28 positions shown · non-contrast
Comparison: none

[Series 1: us ob detail +14 wk · 12 of 88 slices shown]
[im 4/88]
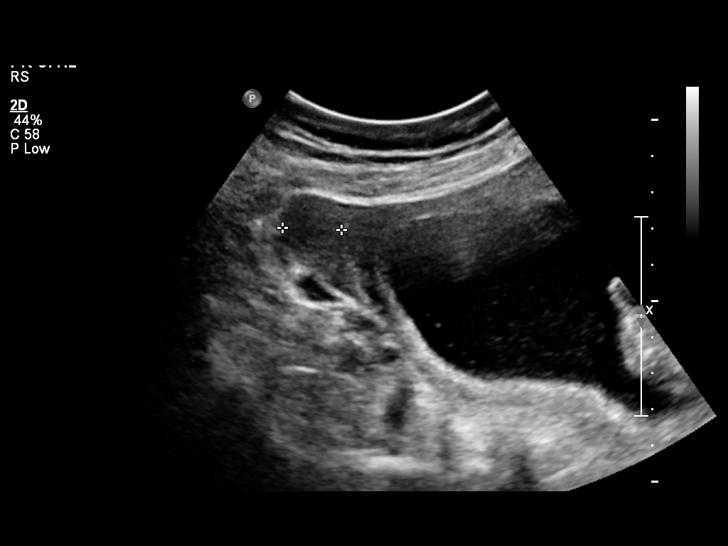
[im 10/88]
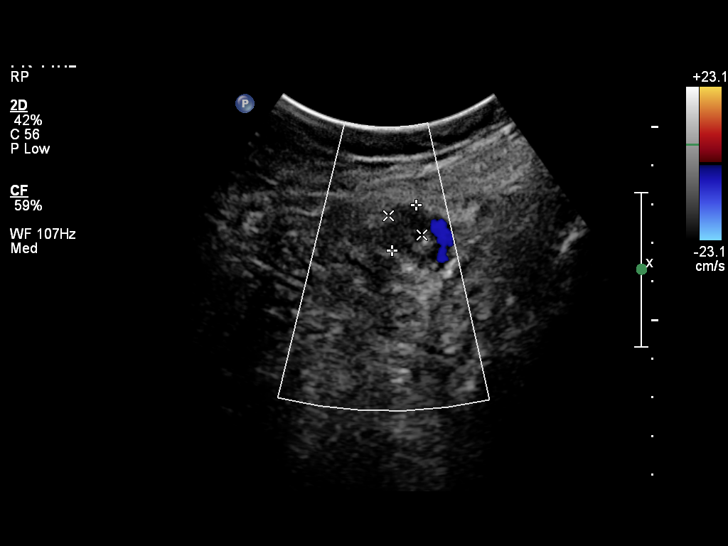
[im 17/88]
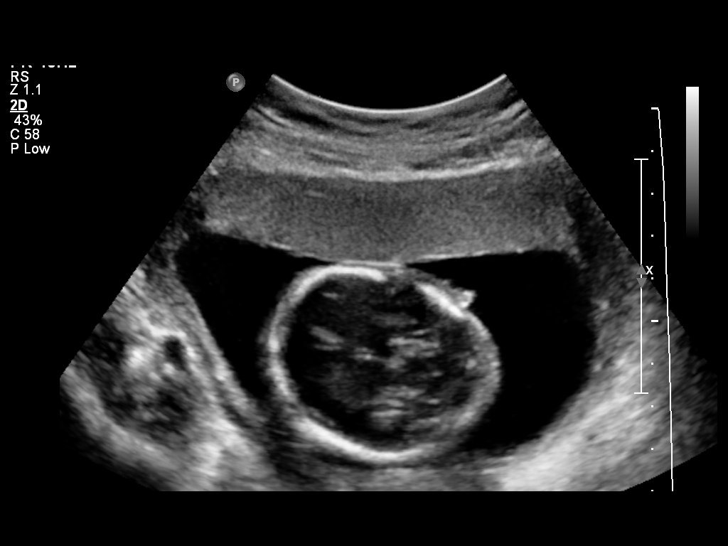
[im 26/88]
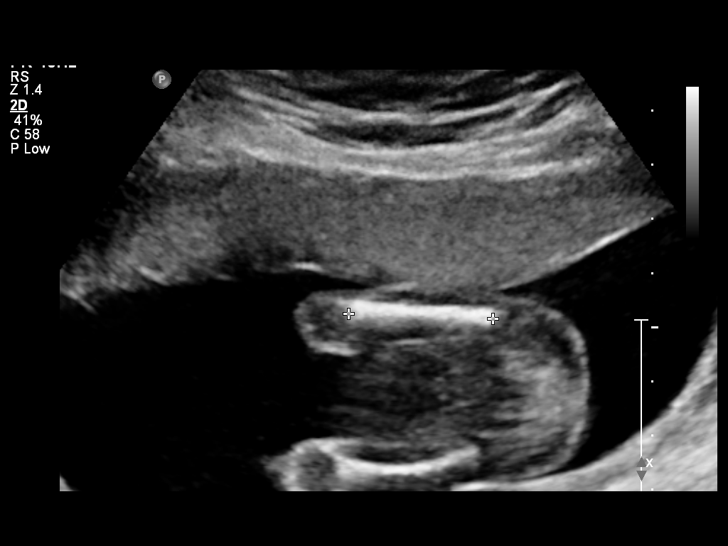
[im 33/88]
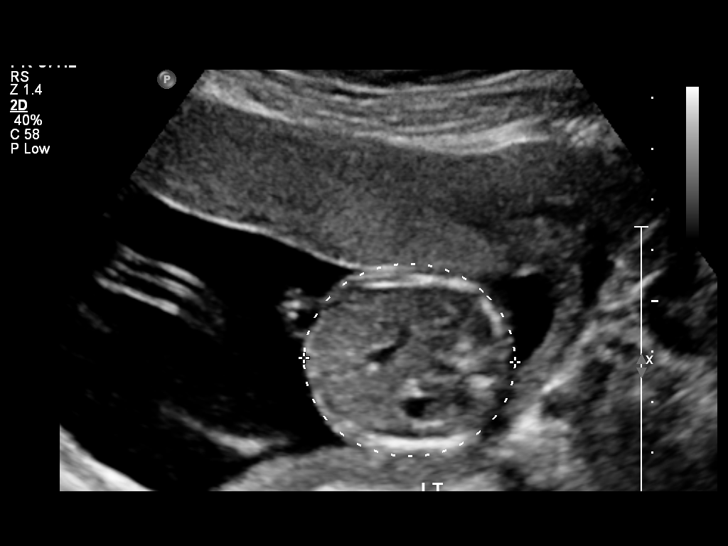
[im 39/88]
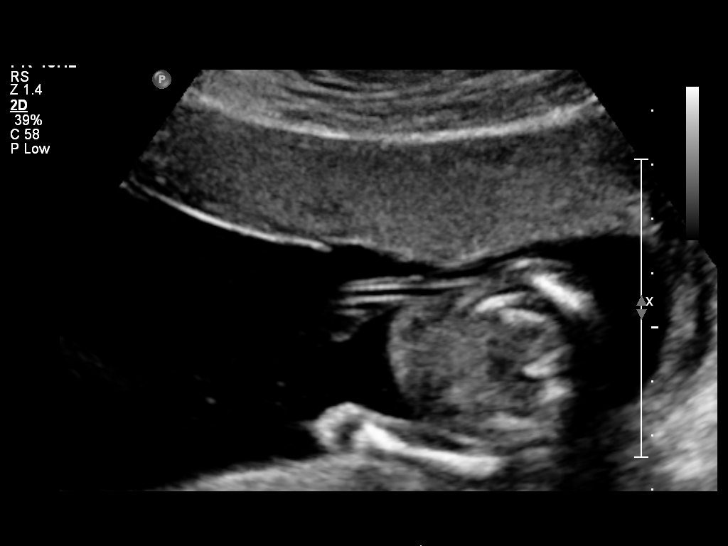
[im 49/88]
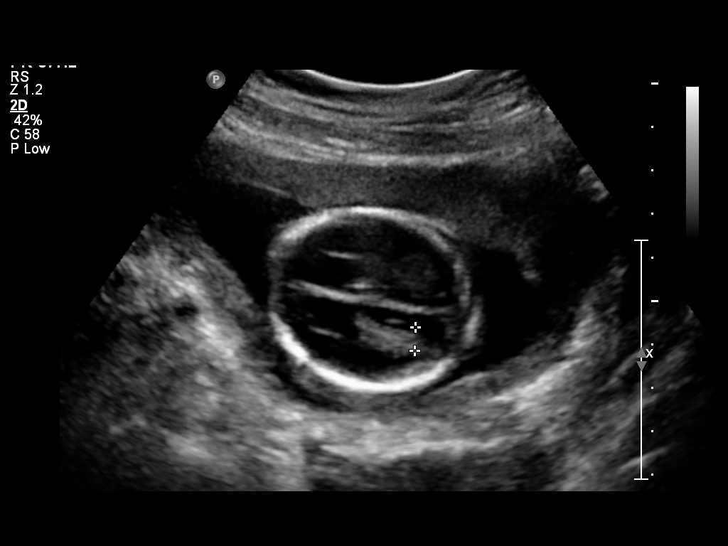
[im 55/88]
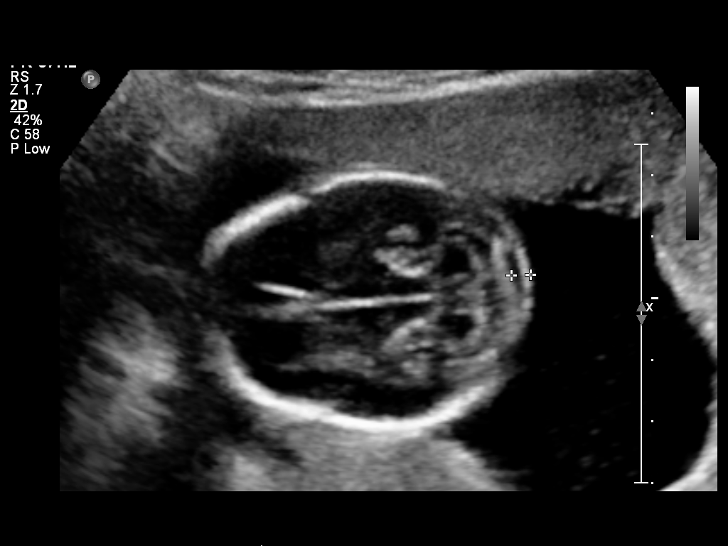
[im 62/88]
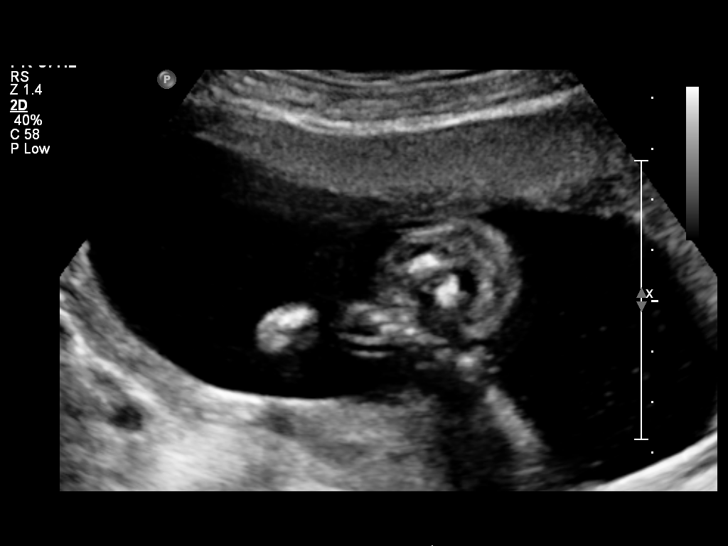
[im 71/88]
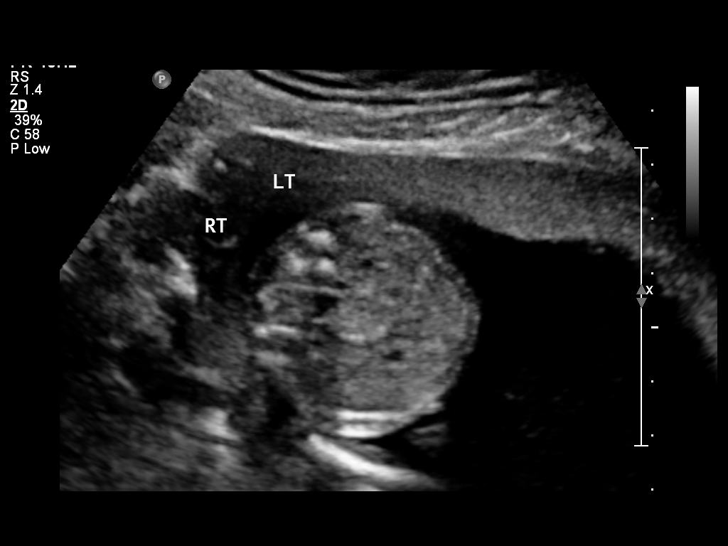
[im 78/88]
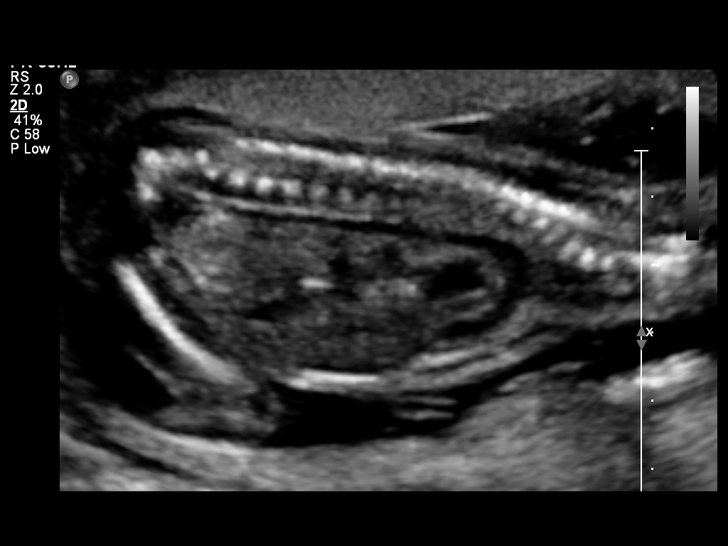
[im 84/88]
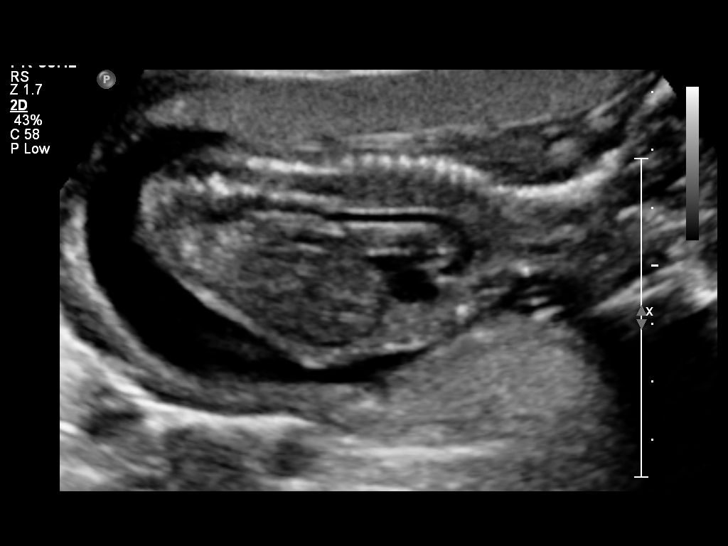

[12 of 28 positions shown; findings below may reference images not displayed]

OBSTETRICS REPORT
                      (Signed Final 07/21/2012 [DATE])

Service(s) Provided

 US OB DETAIL + 14 WK                                  76811.0
Indications

 Detailed fetal anatomic survey
 prior C-section
Fetal Evaluation

 Num Of Fetuses:    1
 Preg. Location:    Intrauterine
 Fetal Heart Rate:  147                         bpm
 Cardiac Activity:  Observed
 Presentation:      Cephalic
 Placenta:          Anterior, above cervical os
 P. Cord            Visualized, central
 Insertion:

 Amniotic Fluid
 AFI FV:      Subjectively within normal limits
                                             Larg Pckt:     5.2  cm
Biometry

 BPD:     40.7  mm    G. Age:   18w 2d                CI:        73.11   70 - 86
                                                      FL/HC:      17.8   15.8 -
                                                                         18
 HC:     151.3  mm    G. Age:   18w 1d       37  %    HC/AC:      1.21   1.07 -

 AC:     125.4  mm    G. Age:   18w 1d       43  %    FL/BPD:
 FL:      26.9  mm    G. Age:   18w 1d       41  %    FL/AC:      21.5   20 - 24
 HUM:     26.5  mm    G. Age:   18w 2d       57  %
 NFT:      3.1  mm

 Est. FW:     228  gm      0 lb 8 oz     45  %
Gestational Age

 LMP:           16w 0d       Date:   03/31/12                 EDD:   01/05/13
 U/S Today:     18w 1d                                        EDD:   12/21/12
 Best:          18w 2d    Det. By:   U/S (07/03/12)           EDD:   12/20/12
2nd Trimester Genetic Sonogram - Trisomy 21 Screening
 Age:                                             33          Risk=1:   389

 Structural anomalies (inc. cardiac):             No
 Echogenic bowel:                                 No
 Short femur:                                     No
 Hypoplastic / absent midphalanx 5th Digit:       No
 Short humerus:                                   No
 Pyelectasis:                                     No
 2-vessel umbilical cord:                         No
 Echogenic cardiac foci:                          No

 8 Of 8 Criteria Were Visualized and 0 Abnormal(s) Were Seen.

 No soft anatomic marker for aneuploidy identified.
Anatomy

 Cranium:          Appears normal         Aortic Arch:      Appears normal
 Fetal Cavum:      Appears normal         Ductal Arch:      Appears normal
 Ventricles:       Appears normal         Diaphragm:        Appears normal
 Choroid Plexus:   Appears normal         Stomach:          Appears normal
 Cerebellum:       Appears normal         Abdomen:          Appears normal
 Posterior Fossa:  Appears normal         Abdominal Wall:   Appears nml (cord
                                                            insert, abd wall)
 Nuchal Fold:      Appears normal         Cord Vessels:     Appears normal (3
                                                            vessel cord)
 Face:             Appears normal         Kidneys:          Appear normal
                   (orbits and profile)
 Lips:             Appears normal         Bladder:          Appears normal
 Heart:            Appears normal         Spine:            Appears normal
                   (4CH, axis, and
                   situs)
 RVOT:             Not well visualized    Lower             Appears normal
                                          Extremities:
 LVOT:             Not well visualized    Upper             Appears normal
                                          Extremities:

 Other:  Fetus appears to be a male. Heels and 5th digit previously visualized.
         Nasal bone visualized.
Cervix Uterus Adnexa

 Cervical Length:   3.7       cm

 Cervix:       Normal appearance by transabdominal scan.
 Uterus:       No abnormality visualized.

 Left Ovary:   Size(cm) L: 1.33 x W: 1.71 x H: 0.99  Volume(cc):
 Right Ovary:  Size(cm) L: 2.61 x W: 1.62 x H: 1.64  Volume(cc):
 Adnexa:     No abnormality visualized.
Impression

 Single live IUP in cephalic presentation.  Concordant
 measurements/assigned GA by US.
 No anatomic abnormality seen with a good quality survey
 possible. Outflow tracts not well visualized due to fetal
 position.
 questions or concerns.

## 2014-04-22 NOTE — Discharge Instructions (Signed)

## 2014-04-22 NOTE — ED Notes (Signed)
Dr. Brooten at bedside. 

## 2014-04-30 NOTE — ED Provider Notes (Signed)
I saw and evaluated the patient, reviewed the resident's note and I agree with the findings and plan.   EKG Interpretation None      Pt comes in with cc of abd pain. [place]. Patient had LUQ tenderness on exam, reproducible. There is no epigastric tenderness. She has no hx of ovarian cyst, and her pain is not typical of torsion (not sharp, severe, sudden). Needs optimal PCP f/u.  Varney Biles, MD 04/30/14 [phone]

## 2014-07-19 ENCOUNTER — Encounter (HOSPITAL_COMMUNITY): Payer: Self-pay | Admitting: Emergency Medicine

## 2014-07-23 ENCOUNTER — Emergency Department (INDEPENDENT_AMBULATORY_CARE_PROVIDER_SITE_OTHER)
Admission: EM | Admit: 2014-07-23 | Discharge: 2014-07-23 | Disposition: A | Discharge status: Home / Self Care | Payer: Self-pay | Source: Home / Self Care | Attending: Family Medicine | Admitting: Family Medicine

## 2014-07-23 ENCOUNTER — Encounter (HOSPITAL_COMMUNITY): Payer: Self-pay | Admitting: Emergency Medicine

## 2014-07-23 DIAGNOSIS — J309 Allergic rhinitis, unspecified: Secondary | ICD-10-CM

## 2014-07-23 DIAGNOSIS — H1013 Acute atopic conjunctivitis, bilateral: Secondary | ICD-10-CM

## 2014-07-23 DIAGNOSIS — J329 Chronic sinusitis, unspecified: Secondary | ICD-10-CM

## 2014-07-23 MED ORDER — BENZONATATE 200 MG PO CAPS: 200.0000 mg | ORAL_CAPSULE | Freq: Three times a day (TID) | ORAL | Status: DC | PRN

## 2014-07-23 MED ORDER — AZITHROMYCIN 250 MG PO TABS: ORAL_TABLET | ORAL | Status: DC

## 2014-07-23 MED ORDER — PREDNISONE 10 MG PO TABS: ORAL_TABLET | ORAL | Status: DC

## 2014-07-23 MED ORDER — FLUTICASONE PROPIONATE 50 MCG/ACT NA SUSP: 2.0000 | Freq: Two times a day (BID) | NASAL | Status: DC

## 2014-07-23 NOTE — Discharge Instructions (Signed)
Take OTC Advil allergy sinus as needed for your symptoms Use OTC ketotifen eyedrops as needed for the eye irritation   Sinusitis Sinusitis is redness, soreness, and inflammation of the paranasal sinuses. Paranasal sinuses are air pockets within the bones of your face (beneath the eyes, the middle of the forehead, or above the eyes). In healthy paranasal sinuses, mucus is able to drain out, and air is able to circulate through them by way of your nose. However, when your paranasal sinuses are inflamed, mucus and air can become trapped. This can allow bacteria and other germs to grow and cause infection. Sinusitis can develop quickly and last only a short time (acute) or continue over a long period (chronic). Sinusitis that lasts for more than 12 weeks is considered chronic.  CAUSES  Causes of sinusitis include:  Allergies.  Structural abnormalities, such as displacement of the cartilage that separates your nostrils (deviated septum), which can decrease the air flow through your nose and sinuses and affect sinus drainage.  Functional abnormalities, such as when the small hairs (cilia) that line your sinuses and help remove mucus do not work properly or are not present. SIGNS AND SYMPTOMS  Symptoms of acute and chronic sinusitis are the same. The primary symptoms are pain and pressure around the affected sinuses. Other symptoms include:  Upper toothache.  Earache.  Headache.  Bad breath.  Decreased sense of smell and taste.  A cough, which worsens when you are lying flat.  Fatigue.  Fever.  Thick drainage from your nose, which often is green and may contain pus (purulent).  Swelling and warmth over the affected sinuses. DIAGNOSIS  Your health care provider will perform a physical exam. During the exam, your health care provider may:  Look in your nose for signs of abnormal growths in your nostrils (nasal polyps).  Tap over the affected sinus to check for signs of  infection.  View the inside of your sinuses (endoscopy) using an imaging device that has a light attached (endoscope). If your health care provider suspects that you have chronic sinusitis, one or more of the following tests may be recommended:  Allergy tests.  Nasal culture. A sample of mucus is taken from your nose, sent to a lab, and screened for bacteria.  Nasal cytology. A sample of mucus is taken from your nose and examined by your health care provider to determine if your sinusitis is related to an allergy. TREATMENT  Most cases of acute sinusitis are related to a viral infection and will resolve on their own within 10 days. Sometimes medicines are prescribed to help relieve symptoms (pain medicine, decongestants, nasal steroid sprays, or saline sprays).  However, for sinusitis related to a bacterial infection, your health care provider will prescribe antibiotic medicines. These are medicines that will help kill the bacteria causing the infection.  Rarely, sinusitis is caused by a fungal infection. In theses cases, your health care provider will prescribe antifungal medicine. For some cases of chronic sinusitis, surgery is needed. Generally, these are cases in which sinusitis recurs more than 3 times per year, despite other treatments. HOME CARE INSTRUCTIONS   Drink plenty of water. Water helps thin the mucus so your sinuses can drain more easily.  Use a humidifier.  Inhale steam 3 to 4 times a day (for example, sit in the bathroom with the shower running).  Apply a warm, moist washcloth to your face 3 to 4 times a day, or as directed by your health care provider.  Use  saline nasal sprays to help moisten and clean your sinuses.  Take medicines only as directed by your health care provider.  If you were prescribed either an antibiotic or antifungal medicine, finish it all even if you start to feel better. SEEK IMMEDIATE MEDICAL CARE IF:  You have increasing pain or severe  headaches.  You have nausea, vomiting, or drowsiness.  You have swelling around your face.  You have vision problems.  You have a stiff neck.  You have difficulty breathing. MAKE SURE YOU:   Understand these instructions.  Will watch your condition.  Will get help right away if you are not doing well or get worse. Document Released: 09/03/2005 Document Revised: 01/18/2014 Document Reviewed: 09/18/2011 [facility] Patient Information 2015 [place], [place]. This information is not intended to replace advice given to you by your health care provider. Make sure you discuss any questions you have with your health care provider.

## 2014-07-23 NOTE — ED Notes (Addendum)
Pt states she has been suffering from cold symptoms on and off for 3 weeks.  She has had a cough, congestion, and pain in her chest and left shoulder when she coughs.  Pt states she has been febrile at home and at work.

## 2014-07-23 NOTE — ED Provider Notes (Signed)
CSN: [id number]     Arrival date & time 07/23/14  1122 History   First MD Initiated Contact with Patient 07/23/14 1132     Chief Complaint  Patient presents with  . Nasal Congestion  . Cough   (Consider location/radiation/quality/duration/timing/severity/associated sxs/prior Treatment) HPI        33 year old female presents complaining of being sick for 3 weeks. She has intermittent subjective fevers, constant cough, headache, congestion. The cough is worse at night. She also is experiencing sore throat, and chest pain with coughing only. She also admits to bilateral eye itching. She has been taking over-the-counter medications at home remedies without relief. She feels like her symptoms are starting to get worse. No recent travel.  Past Medical History  Diagnosis Date  . Acid reflux   . Hemorrhoid   . 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 Female circumcision   . Hypercholesteremia    Past Surgical History  Procedure Laterality Date  . Cesarean section    . Cesarean section N/A 01/02/2013    Procedure: CESAREAN SECTION;  Surgeon: Donnamae Jude, MD;  Location: [facility];  Service: Obstetrics;  Laterality: N/A;  Incidental Cyystotomy   Family History  Problem Relation Age of Onset  . Diabetes Mother   . Diabetes Sister   . Diabetes Brother   . Hypertension Brother   . Other Neg Hx   . Stroke Maternal Aunt    History  Substance Use Topics  . Smoking status: Never Smoker   . Smokeless tobacco: Never Used  . Alcohol Use: No   OB History    Gravida Para Term Preterm AB TAB SAB Ectopic Multiple Living   2 2 2       2      Review of Systems  Constitutional: Positive for fever, chills and fatigue.  HENT: Positive for congestion, postnasal drip, rhinorrhea, sinus pressure and sore throat. Negative for ear pain.   Eyes: Positive for itching.  Respiratory: Positive for cough. Negative for shortness of breath.   Cardiovascular: Positive for chest pain (with coughing only).   Neurological: Positive for headaches.  All other systems reviewed and are negative.   Allergies  Other  Home Medications   Prior to Admission medications   Medication Sig Start Date End Date Taking? Authorizing Provider  azithromycin (ZITHROMAX Z-PAK) 250 MG tablet Use as directed 07/23/14   Liam Graham, PA-C  benzonatate (TESSALON) 200 MG capsule Take 1 capsule (200 mg total) by mouth 3 (three) times daily as needed for cough. 07/23/14   Freeman Caldron Mak Bonny, PA-C  fluticasone (FLONASE) 50 MCG/ACT nasal spray Place 2 sprays into both nostrils 2 (two) times daily. Decrease to 2 sprays/nostril daily after 5 days 07/23/14   Liam Graham, PA-C  Multiple Vitamin (MULTIVITAMIN WITH MINERALS) TABS tablet Take 1 tablet by mouth daily.    Historical Provider, MD  predniSONE (DELTASONE) 10 MG tablet 4 tabs PO QD for 4 days; 3 tabs PO QD for 3 days; 2 tabs PO QD for 2 days; 1 tab PO QD for 1 day 07/23/14   Liam Graham, PA-C   BP 116/79 mmHg  Pulse 88  Temp(Src) 98.5 F (36.9 C) (Oral)  Resp 18  SpO2 97% Physical Exam  Constitutional: She is oriented to person, place, and time. Vital signs are normal. She appears well-developed and well-nourished. No distress.  HENT:  Head: Normocephalic and atraumatic.  Right Ear: External ear normal.  Left Ear: External ear  normal.  Nose: Right sinus exhibits frontal sinus tenderness. Right sinus exhibits no maxillary sinus tenderness. Left sinus exhibits frontal sinus tenderness. Left sinus exhibits no maxillary sinus tenderness.  Mouth/Throat: Uvula is midline and oropharynx is clear and moist. No oropharyngeal exudate.  Eyes: Conjunctivae and EOM are normal. Pupils are equal, round, and reactive to light. Right eye exhibits no discharge. Left eye exhibits no discharge.  Neck: Normal range of motion. Neck supple.  Cardiovascular: Normal rate, regular rhythm, normal heart sounds and intact distal pulses.  Exam reveals no gallop and no friction rub.   No  murmur heard. Pulmonary/Chest: Effort normal and breath sounds normal. No respiratory distress. She has no wheezes. She has no rales. She exhibits no tenderness.  Lymphadenopathy:       Head (right side): Tonsillar adenopathy present.       Head (left side): Tonsillar adenopathy present.  Neurological: She is alert and oriented to person, place, and time. She has normal strength. Coordination normal.  Skin: Skin is warm and dry. No rash noted. She is not diaphoretic.  Psychiatric: She has a normal mood and affect. Judgment normal.  Nursing note and vitals reviewed.   ED Course  Procedures (including critical care time) Labs Review Labs Reviewed - No data to display  Imaging Review No results found.   MDM   1. Rhinosinusitis   2. Allergic conjunctivitis and rhinitis, bilateral    Given duration of her illness and worsening course, will treat with antibiotics as well as symptomatic management. I believe she also has a significant component of seasonal allergic rhinitis. That is being treated as well. Follow-up one week if no improvement.   Meds ordered this encounter  Medications  . azithromycin (ZITHROMAX Z-PAK) 250 MG tablet    Sig: Use as directed    Dispense:  6 each    Refill:  0    Order Specific Question:  Supervising Provider    Answer:  Billy Fischer [phone]  . fluticasone (FLONASE) 50 MCG/ACT nasal spray    Sig: Place 2 sprays into both nostrils 2 (two) times daily. Decrease to 2 sprays/nostril daily after 5 days    Dispense:  16 g    Refill:  2    Order Specific Question:  Supervising Provider    Answer:  Billy Fischer [phone]  . predniSONE (DELTASONE) 10 MG tablet    Sig: 4 tabs PO QD for 4 days; 3 tabs PO QD for 3 days; 2 tabs PO QD for 2 days; 1 tab PO QD for 1 day    Dispense:  30 tablet    Refill:  0    Order Specific Question:  Supervising Provider    Answer:  Billy Fischer [phone]  . benzonatate (TESSALON) 200 MG capsule    Sig: Take 1 capsule (200 mg  total) by mouth 3 (three) times daily as needed for cough.    Dispense:  20 capsule    Refill:  0    Order Specific Question:  Supervising Provider    Answer:  Ihor Gully D [[id number]]       Liam Graham, PA-C 07/23/14 1155

## 2014-07-26 ENCOUNTER — Emergency Department (INDEPENDENT_AMBULATORY_CARE_PROVIDER_SITE_OTHER)
Admission: EM | Admit: 2014-07-26 | Discharge: 2014-07-26 | Disposition: A | Discharge status: Home / Self Care | Payer: Self-pay | Source: Home / Self Care | Attending: Family Medicine | Admitting: Family Medicine

## 2014-07-26 ENCOUNTER — Ambulatory Visit (HOSPITAL_COMMUNITY): Payer: Self-pay | Attending: Family Medicine

## 2014-07-26 ENCOUNTER — Encounter (HOSPITAL_COMMUNITY): Payer: Self-pay | Admitting: Emergency Medicine

## 2014-07-26 DIAGNOSIS — R0600 Dyspnea, unspecified: Secondary | ICD-10-CM | POA: Insufficient documentation

## 2014-07-26 DIAGNOSIS — R05 Cough: Secondary | ICD-10-CM | POA: Insufficient documentation

## 2014-07-26 DIAGNOSIS — J40 Bronchitis, not specified as acute or chronic: Secondary | ICD-10-CM

## 2014-07-26 MED ORDER — HYDROCOD POLST-CHLORPHEN POLST 10-8 MG/5ML PO LQCR: 5.0000 mL | Freq: Two times a day (BID) | ORAL | Status: DC | PRN

## 2014-07-26 MED ORDER — AZITHROMYCIN 250 MG PO TABS: ORAL_TABLET | ORAL | Status: DC

## 2014-07-26 MED ORDER — METHYLPREDNISOLONE SODIUM SUCC 125 MG IJ SOLR
125.0000 mg | Freq: Once | INTRAMUSCULAR | Status: AC
  Administered 2014-07-26: 125 mg via INTRAMUSCULAR

## 2014-07-26 MED ORDER — IPRATROPIUM BROMIDE 0.06 % NA SOLN: 2.0000 | Freq: Four times a day (QID) | NASAL | Status: DC

## 2014-07-26 MED ORDER — METHYLPREDNISOLONE SODIUM SUCC 125 MG IJ SOLR
INTRAMUSCULAR | Status: AC
  Filled 2014-07-26: qty 2

## 2014-07-26 NOTE — Discharge Instructions (Signed)
Take all of medicine, drink lots of fluids, see your doctor if further problems °

## 2014-07-26 NOTE — ED Provider Notes (Signed)
CSN: [id number]     Arrival date & time 07/26/14  1438 History   First MD Initiated Contact with Patient 07/26/14 1449     Chief Complaint  Patient presents with  . Cough   (Consider location/radiation/quality/duration/timing/severity/associated sxs/prior Treatment) Patient is a 33 y.o. female presenting with cough. The history is provided by the patient.  Cough Cough characteristics:  Non-productive and dry Severity:  Moderate Onset quality:  Gradual Duration:  3 weeks Chronicity:  New (seen 11/6 at [facility] and sx continue.) Relieved by:  Nothing Associated symptoms: rhinorrhea, shortness of breath and sore throat   Associated symptoms: no fever     Past Medical History  Diagnosis Date  . Acid reflux   . Hemorrhoid   . 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 Female circumcision   . Hypercholesteremia    Past Surgical History  Procedure Laterality Date  . Cesarean section    . Cesarean section N/A 01/02/2013    Procedure: CESAREAN SECTION;  Surgeon: Donnamae Jude, MD;  Location: [facility];  Service: Obstetrics;  Laterality: N/A;  Incidental Cyystotomy   Family History  Problem Relation Age of Onset  . Diabetes Mother   . Diabetes Sister   . Diabetes Brother   . Hypertension Brother   . Other Neg Hx   . Stroke Maternal Aunt    History  Substance Use Topics  . Smoking status: Never Smoker   . Smokeless tobacco: Never Used  . Alcohol Use: No   OB History    Gravida Para Term Preterm AB TAB SAB Ectopic Multiple Living   2 2 2       2      Review of Systems  Constitutional: Negative.  Negative for fever.  HENT: Positive for congestion, postnasal drip, rhinorrhea and sore throat.   Respiratory: Positive for cough, chest tightness and shortness of breath.   Cardiovascular: Negative.     Allergies  Other  Home Medications   Prior to Admission medications   Medication Sig Start Date End Date Taking? Authorizing Provider  benzonatate (TESSALON) 200 MG capsule Take 1  capsule (200 mg total) by mouth 3 (three) times daily as needed for cough. 07/23/14  Yes Liam Graham, PA-C  predniSONE (DELTASONE) 10 MG tablet 4 tabs PO QD for 4 days; 3 tabs PO QD for 3 days; 2 tabs PO QD for 2 days; 1 tab PO QD for 1 day 07/23/14  Yes Liam Graham, PA-C  azithromycin (ZITHROMAX Z-PAK) 250 MG tablet Take as directed on pack 07/26/14   Billy Fischer, MD  chlorpheniramine-HYDROcodone (TUSSIONEX PENNKINETIC ER) 10-8 MG/5ML LQCR Take 5 mLs by mouth every 12 (twelve) hours as needed for cough. 07/26/14   Billy Fischer, MD  fluticasone (FLONASE) 50 MCG/ACT nasal spray Place 2 sprays into both nostrils 2 (two) times daily. Decrease to 2 sprays/nostril daily after 5 days 07/23/14   Liam Graham, PA-C  ipratropium (ATROVENT) 0.06 % nasal spray Place 2 sprays into both nostrils 4 (four) times daily. 07/26/14   Billy Fischer, MD  Multiple Vitamin (MULTIVITAMIN WITH MINERALS) TABS tablet Take 1 tablet by mouth daily.    Historical Provider, MD   BP 114/87 mmHg  Pulse 88  Temp(Src) 98.2 F (36.8 C) (Oral)  Resp 16  SpO2 98% Physical Exam  Constitutional: She is oriented to person, place, and time. She appears well-developed and well-nourished. No distress.  HENT:  Head: Normocephalic.  Right Ear: External  ear normal.  Left Ear: External ear normal.  Mouth/Throat: Oropharynx is clear and moist.  Eyes: Conjunctivae are normal. Pupils are equal, round, and reactive to light.  Neck: Normal range of motion. Neck supple.  Cardiovascular: Normal heart sounds.   Pulmonary/Chest: Effort normal. She has wheezes. She has rales.  Lymphadenopathy:    She has no cervical adenopathy.  Neurological: She is alert and oriented to person, place, and time.  Skin: Skin is warm and dry.  Nursing note and vitals reviewed.   ED Course  Procedures (including critical care time) Labs Review Labs Reviewed - No data to display  Imaging Review Dg Chest 2 View  07/26/2014   CLINICAL DATA:   Three-week history of cough and difficulty breathing  EXAM: CHEST  2 VIEW  COMPARISON:  February 20, 2011  FINDINGS: Lungs are clear. Heart size and pulmonary vascularity are normal. No adenopathy. No bone lesions.  IMPRESSION: No edema or consolidation.   Electronically Signed   By: Lowella Grip M.D.   On: 07/26/2014 15:36   X-rays reviewed and report per radiologist.   MDM   1. Bronchitis with tracheitis        Billy Fischer, MD 07/26/14 [phone]

## 2014-07-26 NOTE — ED Notes (Signed)
Pt was seen on Friday, but is not getting better.  She was unable to go to work today and would like to have something for her cough and a note to be excused from work.

## 2014-11-10 ENCOUNTER — Emergency Department (HOSPITAL_COMMUNITY)
Admission: EM | Admit: 2014-11-10 | Discharge: 2014-11-10 | Disposition: A | Discharge status: Home / Self Care | Payer: 59 | Source: Home / Self Care

## 2014-11-10 ENCOUNTER — Encounter (HOSPITAL_COMMUNITY): Payer: Self-pay | Admitting: Emergency Medicine

## 2014-11-10 DIAGNOSIS — A084 Viral intestinal infection, unspecified: Secondary | ICD-10-CM

## 2014-11-10 MED ORDER — ONDANSETRON 4 MG PO TBDP
ORAL_TABLET | ORAL | Status: AC
  Filled 2014-11-10: qty 2

## 2014-11-10 MED ORDER — DICYCLOMINE HCL 20 MG PO TABS: 20.0000 mg | ORAL_TABLET | Freq: Two times a day (BID) | ORAL | Status: DC

## 2014-11-10 MED ORDER — ONDANSETRON 8 MG PO TBDP: 8.0000 mg | ORAL_TABLET | Freq: Three times a day (TID) | ORAL | Status: DC | PRN

## 2014-11-10 MED ORDER — ONDANSETRON 4 MG PO TBDP
8.0000 mg | ORAL_TABLET | Freq: Once | ORAL | Status: AC
  Administered 2014-11-10: 8 mg via ORAL

## 2014-11-10 MED ORDER — DIPHENOXYLATE-ATROPINE 2.5-0.025 MG PO TABS: 1.0000 | ORAL_TABLET | Freq: Four times a day (QID) | ORAL | Status: DC | PRN

## 2014-11-10 NOTE — ED Notes (Signed)
Seen  by provider only

## 2014-11-10 NOTE — ED Provider Notes (Signed)
   Chief Complaint   GI Problem    History of Present Illness   Michaela Moran is a 34 year old female who is here today with HER-2 children husband who all have the same thing. She presents with a history since last night of nausea, vomiting, diarrhea, subjective fever, abdominal pain. Has been no blood in the vomitus or the stool. She denies any URI symptoms.  Review of Systems   Other than as noted above, the patient denies any of the following symptoms: Systemic:  No fevers, chills, or dizziness. GI:  Blood in stool or vomitus. GU:  No dysuria, frequency, or urgency.  [place]   Past medical history, family history, social history, meds, and allergies were reviewed.    Physical Exam     Vital signs:  There were no vitals taken for this visit. No data found.  General:  Alert and oriented.  In no distress.  Skin warm and dry.  Good skin turgor, brisk capillary refill. ENT:  No scleral icterus, moist mucous membranes, no oral lesions, pharynx clear. Lungs:  Breath sounds clear and equal bilaterally.  No wheezes, rales, or rhonchi. Heart:  Rhythm regular, without extrasystoles.  No gallops or murmers. Abdomen:  Soft, flat, nondistended. No organomegaly or mass. Bowel sounds are hyperactive. She has moderate periumbilical tenderness to palpation without guarding or rebound. No other tenderness to palpation. Skin: Clear, warm, and dry.  Good turgor.  Brisk capillary refill.   Course in Urgent [place]   The following medications were given:  Medications  ondansetron (ZOFRAN-ODT) disintegrating tablet 8 mg (8 mg Oral Given 11/10/14 1815)   Assessment   The encounter diagnosis was Viral gastroenteritis.  No evidence of dehydration.  Plan   1.  Meds:  The following meds were prescribed:   Discharge Medication List as of 11/10/2014  5:54 PM    START taking these medications   Details  dicyclomine (BENTYL) 20 MG tablet Take 1 tablet (20 mg total) by mouth 2 (two) times daily.,  Starting 11/10/2014, Until Discontinued, Normal    diphenoxylate-atropine (LOMOTIL) 2.5-0.025 MG per tablet Take 1 tablet by mouth 4 (four) times daily as needed for diarrhea or loose stools., Starting 11/10/2014, Until Discontinued, Print    ondansetron (ZOFRAN ODT) 8 MG disintegrating tablet Take 1 tablet (8 mg total) by mouth every 8 (eight) hours as needed for nausea., Starting 11/10/2014, Until Discontinued, Normal        2.  Patient Education/Counseling:  The patient was given appropriate handouts, self care instructions, and instructed in symptomatic relief. The patient was told to stay on clear liquids for the remainder of the day, then advance to a B.R.A.T. diet starting tomorrow.   3.  Follow up:  The patient was told to follow up here if no better in 2 to 3 days, or sooner if becoming worse in any way, and given some red flag symptoms such as persistent vomitng, high fever, severe abdominal pain, or any GI bleeding which would prompt immediate return.         Harden Mo, MD 11/10/14 2159

## 2014-11-10 NOTE — ED Notes (Signed)
Pt was in room with child. Pt placed back in system to be seen by provider

## 2014-11-10 NOTE — ED Notes (Signed)
Called pt name 3 times no one answer or came forward.

## 2014-11-10 NOTE — ED Notes (Signed)
Pt name was called 3 time no one came forward or answered

## 2014-11-10 NOTE — Discharge Instructions (Signed)
Do not breast feed while taking medications.  You have been diagnosed with gastroenteritis.  This can be caused by a virus or a bacteria.  Viral infections can last from less than a day to a week.  If your symptoms last more than a week, a bacterial infection is more likely.  Either way, you must assume you are contagious and take infectious precautions.  If you work in food preparation, you should stay out of work.  Likewise, you should not prepare food for your family.  Practice frequent hand washing.  Hand sanitizer does not reliably kill the virus.  Wash your hands after you use the bathroom, touch your mouth or face, and before contact with anyone.  Do not kiss anyone and do not let anyone eat or drink after you.  For right now, we recommend taking only clear liquids.  This would include things like Gator Aid or other sports drinks, tea, water, ice chips, clear juices, ginger ale, Seven-Up, Sprite, Pedialyte, jello, clear broth--anything you can see through and applesauce.  You should do this for at least 24 hours, perhaps longer.  We recommend small sips at a time.  Sometimes drinking a large amount will cause you to be nauseated and you will vomit it back up.  Sometimes it helps to have this chilled or drink it over ice chips.  Once your stomach settles down a little, you can advance to a very light diet.  We have a diet called the b.r.a.t. Diet which stands for the following:  Bananas  Rice  Apple sauce (not apple juice)  Toast or crackers.  If diarrhea becomes a problem, you may try Imodeum unless your doctor tells you not to. You can take up to 4 per day or 1 every 6 hours.  Stick with this for about 24 hours, then you may advance to a more regular diet, but your stomach will be sensitive for 5 to 7 days, so it would be a good idea to avoid heavy, greasy, fried, or spicey foods.    You should return if:  You symptoms are not better in 3 days or they have gone on for 7 days  total.  You have severe symptoms of high fever or severe abdominal pain.  You feel you are getting dehydrated with dizziness, weakness, muscle cramps, or severe fatigue.  You have blood in your vomitus or stool.  This includes black discoloration of your vomitus or stool.  But remember that [place] can cause black stools.

## 2015-05-12 ENCOUNTER — Encounter (HOSPITAL_COMMUNITY): Payer: Self-pay | Admitting: Family Medicine

## 2015-05-12 ENCOUNTER — Emergency Department (HOSPITAL_COMMUNITY)
Admission: EM | Admit: 2015-05-12 | Discharge: 2015-05-12 | Disposition: A | Discharge status: Home / Self Care | Payer: Medicaid Other | Attending: Emergency Medicine | Admitting: Emergency Medicine

## 2015-05-12 DIAGNOSIS — R519 Headache, unspecified: Secondary | ICD-10-CM

## 2015-05-12 DIAGNOSIS — Z8639 Personal history of other endocrine, nutritional and metabolic disease: Secondary | ICD-10-CM | POA: Diagnosis not present

## 2015-05-12 DIAGNOSIS — M545 Low back pain, unspecified: Secondary | ICD-10-CM

## 2015-05-12 DIAGNOSIS — Z7951 Long term (current) use of inhaled steroids: Secondary | ICD-10-CM | POA: Insufficient documentation

## 2015-05-12 DIAGNOSIS — Z3202 Encounter for pregnancy test, result negative: Secondary | ICD-10-CM | POA: Insufficient documentation

## 2015-05-12 DIAGNOSIS — Z79899 Other long term (current) drug therapy: Secondary | ICD-10-CM | POA: Diagnosis not present

## 2015-05-12 DIAGNOSIS — Z87448 Personal history of other diseases of urinary system: Secondary | ICD-10-CM | POA: Diagnosis not present

## 2015-05-12 DIAGNOSIS — R51 Headache: Secondary | ICD-10-CM | POA: Diagnosis not present

## 2015-05-12 DIAGNOSIS — R109 Unspecified abdominal pain: Secondary | ICD-10-CM | POA: Insufficient documentation

## 2015-05-12 DIAGNOSIS — Z8719 Personal history of other diseases of the digestive system: Secondary | ICD-10-CM | POA: Diagnosis not present

## 2015-05-12 LAB — CBC
HCT: 40.1 % (ref 36.0–46.0)
HEMOGLOBIN: 14.2 g/dL (ref 12.0–15.0)
MCH: 31 pg (ref 26.0–34.0)
MCHC: 35.4 g/dL (ref 30.0–36.0)
MCV: 87.6 fL (ref 78.0–100.0)
Platelets: 287 10*3/uL (ref 150–400)
RBC: 4.58 MIL/uL (ref 3.87–5.11)
RDW: 13.1 % (ref 11.5–15.5)
WBC: 5.5 10*3/uL (ref 4.0–10.5)

## 2015-05-12 LAB — URINALYSIS, ROUTINE W REFLEX MICROSCOPIC
Bilirubin Urine: NEGATIVE
GLUCOSE, UA: NEGATIVE mg/dL
KETONES UR: NEGATIVE mg/dL
LEUKOCYTES UA: NEGATIVE
Nitrite: NEGATIVE
PROTEIN: NEGATIVE mg/dL
Specific Gravity, Urine: 1.017 (ref 1.005–1.030)
UROBILINOGEN UA: 0.2 mg/dL (ref 0.0–1.0)
pH: 5.5 (ref 5.0–8.0)

## 2015-05-12 LAB — COMPREHENSIVE METABOLIC PANEL
ALK PHOS: 80 U/L (ref 38–126)
ALT: 18 U/L (ref 14–54)
ANION GAP: 8 (ref 5–15)
AST: 22 U/L (ref 15–41)
Albumin: 3.7 g/dL (ref 3.5–5.0)
BILIRUBIN TOTAL: 0.5 mg/dL (ref 0.3–1.2)
BUN: 12 mg/dL (ref 6–20)
CALCIUM: 9.2 mg/dL (ref 8.9–10.3)
CO2: 23 mmol/L (ref 22–32)
CREATININE: 0.71 mg/dL (ref 0.44–1.00)
Chloride: 107 mmol/L (ref 101–111)
Glucose, Bld: 95 mg/dL (ref 65–99)
Potassium: 3.8 mmol/L (ref 3.5–5.1)
Sodium: 138 mmol/L (ref 135–145)
TOTAL PROTEIN: 7.1 g/dL (ref 6.5–8.1)

## 2015-05-12 LAB — URINE MICROSCOPIC-ADD ON: RBC / HPF: NONE SEEN RBC/hpf (ref <3)

## 2015-05-12 LAB — I-STAT BETA HCG BLOOD, ED (MC, WL, AP ONLY): I-stat hCG, quantitative: <5 m[IU]/mL (ref <5)

## 2015-05-12 LAB — LIPASE, BLOOD: Lipase: 23 U/L (ref 22–51)

## 2015-05-12 MED ORDER — TRAMADOL HCL 50 MG PO TABS: 50.0000 mg | ORAL_TABLET | Freq: Four times a day (QID) | ORAL | Status: DC | PRN

## 2015-05-12 MED ORDER — ACETAMINOPHEN 500 MG PO TABS
1000.0000 mg | ORAL_TABLET | Freq: Once | ORAL | Status: AC
Start: 2015-05-12 — End: 2015-05-12
  Administered 2015-05-12: 1000 mg via ORAL
  Filled 2015-05-12: qty 2

## 2015-05-12 NOTE — ED Provider Notes (Signed)
CSN: [id number]     Arrival date & time 05/12/15  1001 History   First MD Initiated Contact with Patient 05/12/15 1113     Chief Complaint  Patient presents with  . Back Pain  . Abdominal Pain     (Consider location/radiation/quality/duration/timing/severity/associated sxs/prior Treatment) HPI..... Patient complains of bilateral low back pain left greater than right with associated headache for 3 days. No hematuria, dysuria, frequency, fever, chills, neurodeficits. Normal urination. She thinks it may be a pulled muscle. Severity of symptoms is mild to moderate. Nothing makes symptoms better or worse  Past Medical History  Diagnosis Date  . Acid reflux   . Hemorrhoid   . 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 Female circumcision   . Hypercholesteremia    Past Surgical History  Procedure Laterality Date  . Cesarean section    . Cesarean section N/A 01/02/2013    Procedure: CESAREAN SECTION;  Surgeon: Donnamae Jude, MD;  Location: [facility];  Service: Obstetrics;  Laterality: N/A;  Incidental Cyystotomy   Family History  Problem Relation Age of Onset  . Diabetes Mother   . Diabetes Sister   . Diabetes Brother   . Hypertension Brother   . Other Neg Hx   . Stroke Maternal Aunt    Social History  Substance Use Topics  . Smoking status: Never Smoker   . Smokeless tobacco: Never Used  . Alcohol Use: No   OB History    Gravida Para Term Preterm AB TAB SAB Ectopic Multiple Living   2 2 2       2      Review of Systems  All other systems reviewed and are negative.     Allergies  Other  Home Medications   Prior to Admission medications   Medication Sig Start Date End Date Taking? Authorizing Provider  azithromycin (ZITHROMAX Z-PAK) 250 MG tablet Take as directed on pack 07/26/14   Billy Fischer, MD  benzonatate (TESSALON) 200 MG capsule Take 1 capsule (200 mg total) by mouth 3 (three) times daily as needed for cough. 07/23/14   Liam Graham, PA-C   chlorpheniramine-HYDROcodone (TUSSIONEX PENNKINETIC ER) 10-8 MG/5ML LQCR Take 5 mLs by mouth every 12 (twelve) hours as needed for cough. 07/26/14   Billy Fischer, MD  dicyclomine (BENTYL) 20 MG tablet Take 1 tablet (20 mg total) by mouth 2 (two) times daily. 11/10/14   Harden Mo, MD  diphenoxylate-atropine (LOMOTIL) 2.5-0.025 MG per tablet Take 1 tablet by mouth 4 (four) times daily as needed for diarrhea or loose stools. 11/10/14   Harden Mo, MD  fluticasone (FLONASE) 50 MCG/ACT nasal spray Place 2 sprays into both nostrils 2 (two) times daily. Decrease to 2 sprays/nostril daily after 5 days 07/23/14   Liam Graham, PA-C  ipratropium (ATROVENT) 0.06 % nasal spray Place 2 sprays into both nostrils 4 (four) times daily. 07/26/14   Billy Fischer, MD  Multiple Vitamin (MULTIVITAMIN WITH MINERALS) TABS tablet Take 1 tablet by mouth daily.    Historical Provider, MD  ondansetron (ZOFRAN ODT) 8 MG disintegrating tablet Take 1 tablet (8 mg total) by mouth every 8 (eight) hours as needed for nausea. 11/10/14   Harden Mo, MD  predniSONE (DELTASONE) 10 MG tablet 4 tabs PO QD for 4 days; 3 tabs PO QD for 3 days; 2 tabs PO QD for 2 days; 1 tab PO QD for 1 day 07/23/14   Liam Graham, PA-C  traMADol (ULTRAM) 50 MG tablet Take 1 tablet (50 mg total) by mouth every 6 (six) hours as needed. 05/12/15   Nat Christen, MD   BP 102/59 mmHg  Pulse 56  Temp(Src) 97.9 F (36.6 C) (Oral)  Resp 16  Ht 5\' 6"  (1.676 m)  Wt 194 lb (87.998 kg)  BMI 31.33 kg/m2  SpO2 100% Physical Exam  Constitutional: She is oriented to person, place, and time. She appears well-developed and well-nourished.  HENT:  Head: Normocephalic and atraumatic.  Eyes: Conjunctivae and EOM are normal. Pupils are equal, round, and reactive to light.  Neck: Normal range of motion. Neck supple.  Cardiovascular: Normal rate and regular rhythm.   Pulmonary/Chest: Effort normal and breath sounds normal.  Abdominal: Soft. Bowel sounds are  normal.  Genitourinary:  No flank tenderness  Musculoskeletal: Normal range of motion.  Neurological: She is alert and oriented to person, place, and time.  Skin: Skin is warm and dry.  Psychiatric: She has a normal mood and affect. Her behavior is normal.  Nursing note and vitals reviewed.   ED Course  Procedures (including critical care time) Labs Review Labs Reviewed  URINALYSIS, ROUTINE W REFLEX MICROSCOPIC (NOT AT [facility]) - Abnormal; Notable for the following:    Hgb urine dipstick SMALL (*)    All other components within normal limits  URINE MICROSCOPIC-ADD ON - Abnormal; Notable for the following:    Squamous Epithelial / LPF FEW (*)    All other components within normal limits  LIPASE, BLOOD  COMPREHENSIVE METABOLIC PANEL  CBC  I-STAT BETA HCG BLOOD, ED ([facility], [facility], [facility] ONLY)    Imaging Review No results found. I have personally reviewed and evaluated these images and lab results as part of my medical decision-making.   EKG Interpretation None      MDM   Final diagnoses:  Bilateral low back pain without sciatica  Headache, unspecified headache type    Patient is in no acute distress. No neuro deficits. Urinalysis shows small amount of hemoglobin. She is hemodynamically stable. Discharge medications tramadol.    Nat Christen, MD 05/12/15 1426

## 2015-05-12 NOTE — ED Notes (Signed)
Pt here for 3 days of lower back pain and abd pan, sts vomiting, headache and weakness.

## 2015-05-12 NOTE — Discharge Instructions (Signed)
Tests were good. Tylenol or ibuprofen for pain.  Prescription for pain tablet if necessary

## 2015-09-18 HISTORY — DX: Maternal care for unspecified type scar from previous cesarean delivery: O34.219

## 2015-10-31 ENCOUNTER — Ambulatory Visit: Payer: 59 | Admitting: Obstetrics

## 2015-11-10 ENCOUNTER — Inpatient Hospital Stay (HOSPITAL_COMMUNITY)
Admission: AD | Admit: 2015-11-10 | Discharge: 2015-11-10 | Disposition: A | Discharge status: Home / Self Care | Payer: Medicaid Other | Source: Ambulatory Visit | Attending: Obstetrics & Gynecology | Admitting: Obstetrics & Gynecology

## 2015-11-10 ENCOUNTER — Encounter (HOSPITAL_COMMUNITY): Payer: Self-pay | Admitting: Medical

## 2015-11-10 ENCOUNTER — Ambulatory Visit: Payer: 59 | Admitting: Obstetrics

## 2015-11-10 DIAGNOSIS — O26891 Other specified pregnancy related conditions, first trimester: Secondary | ICD-10-CM

## 2015-11-10 DIAGNOSIS — O23591 Infection of other part of genital tract in pregnancy, first trimester: Secondary | ICD-10-CM | POA: Insufficient documentation

## 2015-11-10 DIAGNOSIS — O210 Mild hyperemesis gravidarum: Secondary | ICD-10-CM | POA: Insufficient documentation

## 2015-11-10 DIAGNOSIS — N760 Acute vaginitis: Secondary | ICD-10-CM | POA: Diagnosis not present

## 2015-11-10 DIAGNOSIS — R12 Heartburn: Secondary | ICD-10-CM | POA: Insufficient documentation

## 2015-11-10 DIAGNOSIS — O219 Vomiting of pregnancy, unspecified: Secondary | ICD-10-CM

## 2015-11-10 DIAGNOSIS — Z3A01 Less than 8 weeks gestation of pregnancy: Secondary | ICD-10-CM | POA: Insufficient documentation

## 2015-11-10 LAB — URINE MICROSCOPIC-ADD ON

## 2015-11-10 LAB — URINALYSIS, ROUTINE W REFLEX MICROSCOPIC
Bilirubin Urine: NEGATIVE
GLUCOSE, UA: NEGATIVE mg/dL
KETONES UR: 15 mg/dL — AB
Leukocytes, UA: NEGATIVE
Nitrite: NEGATIVE
PROTEIN: NEGATIVE mg/dL
Specific Gravity, Urine: 1.015 (ref 1.005–1.030)
pH: 6.5 (ref 5.0–8.0)

## 2015-11-10 LAB — POCT PREGNANCY, URINE: Preg Test, Ur: POSITIVE — AB

## 2015-11-10 MED ORDER — PROMETHAZINE HCL 12.5 MG PO TABS: 12.5000 mg | ORAL_TABLET | Freq: Four times a day (QID) | ORAL | Status: DC | PRN

## 2015-11-10 MED ORDER — FAMOTIDINE 20 MG PO TABS: 20.0000 mg | ORAL_TABLET | Freq: Two times a day (BID) | ORAL | Status: DC

## 2015-11-10 MED ORDER — TERCONAZOLE 0.8 % VA CREA: 1.0000 | TOPICAL_CREAM | Freq: Every day | VAGINAL | Status: DC

## 2015-11-10 MED ORDER — PROMETHAZINE HCL 25 MG PO TABS
12.5000 mg | ORAL_TABLET | Freq: Once | ORAL | Status: AC
  Administered 2015-11-10: 12.5 mg via ORAL
  Filled 2015-11-10: qty 1

## 2015-11-10 MED ORDER — FAMOTIDINE 20 MG PO TABS
20.0000 mg | ORAL_TABLET | Freq: Once | ORAL | Status: AC
  Administered 2015-11-10: 20 mg via ORAL
  Filled 2015-11-10: qty 1

## 2015-11-10 NOTE — Discharge Instructions (Signed)
Eating PlanMorning Sickness Morning sickness is when you feel sick to your stomach (nauseous) during pregnancy. You may feel sick to your stomach and throw up (vomit). You may feel sick in the morning, but you can feel this way any time of day. Some women feel very sick to their stomach and cannot stop throwing up (hyperemesis gravidarum). HOME CARE  Only take medicines as told by your doctor.  Take multivitamins as told by your doctor. Taking multivitamins before getting pregnant can stop or lessen the harshness of morning sickness.  Eat dry toast or unsalted crackers before getting out of bed.  Eat 5 to 6 small meals a day.  Eat dry and bland foods like rice and baked potatoes.  Do not drink liquids with meals. Drink between meals.  Do not eat greasy, fatty, or spicy foods.  Have someone cook for you if the smell of food causes you to feel sick or throw up.  If you feel sick to your stomach after taking prenatal vitamins, take them at night or with a snack.  Eat protein when you need a snack (nuts, yogurt, cheese).  Eat unsweetened gelatins for dessert.  Wear a bracelet used for sea sickness (acupressure wristband).  Go to a doctor that puts thin needles into certain body points (acupuncture) to improve how you feel.  Do not smoke.  Use a humidifier to keep the air in your house free of odors.  Get lots of fresh air. GET HELP IF:  You need medicine to feel better.  You feel dizzy or lightheaded.  You are losing weight. GET HELP RIGHT AWAY IF:   You feel very sick to your stomach and cannot stop throwing up.  You pass out (faint). MAKE SURE YOU:  Understand these instructions.  Will watch your condition.  Will get help right away if you are not doing well or get worse.   This information is not intended to replace advice given to you by your health care provider. Make sure you discuss any questions you have with your health care provider.   Document Released:  10/11/2004 Document Revised: 09/24/2014 Document Reviewed: 02/18/2013 Elsevier Interactive Patient Education 2016 Elsevier Inc.  Severe cases of hyperemesis gravidarum can lead to dehydration and malnutrition. The hyperemesis eating plan is one way to lessen the symptoms of nausea and vomiting. It is often used with prescribed medicines to control your symptoms.  WHAT CAN I DO TO RELIEVE MY SYMPTOMS? Listen to your body. Everyone is different and has different preferences. Find what works best for you. Some of the following things may help:  Eat and drink slowly.  Eat 5-6 small meals daily instead of 3 large meals.   Eat crackers before you get out of bed in the morning.   Starchy foods are usually well tolerated (such as cereal, toast, bread, potatoes, pasta, rice, and pretzels).   Ginger may help with nausea. Add  tsp ground ginger to hot tea or choose ginger tea.   Try drinking 100% fruit juice or an electrolyte drink.  Continue to take your prenatal vitamins as directed by your health care provider. If you are having trouble taking your prenatal vitamins, talk with your health care provider about different options.  Include at least 1 serving of protein with your meals and snacks (such as meats or poultry, beans, nuts, eggs, or yogurt). Try eating a protein-rich snack before bed (such as cheese and crackers or a half [place] or peanut butter sandwich). WHAT THINGS SHOULD  I AVOID TO REDUCE MY SYMPTOMS? The following things may help reduce your symptoms:  Avoid foods with strong smells. Try eating meals in well-ventilated areas that are free of odors.  Avoid drinking water or other beverages with meals. Try not to drink anything less than 30 minutes before and after meals.  Avoid drinking more than 1 cup of fluid at a time.  Avoid fried or high-fat foods, such as butter and cream sauces.  Avoid spicy foods.  Avoid skipping meals the best you can. Nausea can be more intense  on an empty stomach. If you cannot tolerate food at that time, do not force it. Try sucking on ice chips or other frozen items and make up the calories later.  Avoid lying down within 2 hours after eating.   This information is not intended to replace advice given to you by your health care provider. Make sure you discuss any questions you have with your health care provider.   Document Released: 07/01/2007 Document Revised: 09/08/2013 Document Reviewed: 07/08/2013 Elsevier Interactive Patient Education [employer].

## 2015-11-10 NOTE — MAU Provider Note (Signed)
History     CSN: [id number]  Arrival date and time: 11/10/15 1851   First Provider Initiated Contact with Patient 11/10/15 1946      Chief Complaint  Patient presents with  . Emesis  . Possible Pregnancy  . Back Pain   HPI [name] is a 35 y.o. G3P2002 at [redacted]w[redacted]d who presents to MAU today with complaint of nausea and vomiting. The patient states that this started about 3 days ago. She denies abdominal pain, UTI symptoms, fever, vaginal bleeding or diarrhea. She does endorse low back pain bilaterally as well as mild constipation.   OB History    Gravida Para Term Preterm AB TAB SAB Ectopic Multiple Living   3 2 2       2       Past Medical History  Diagnosis Date  . Acid reflux   . Hemorrhoid   . 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 Female circumcision   . Hypercholesteremia     Past Surgical History  Procedure Laterality Date  . Cesarean section    . Cesarean section N/A 01/02/2013    Procedure: CESAREAN SECTION;  Surgeon: Donnamae Jude, MD;  Location: [facility];  Service: Obstetrics;  Laterality: N/A;  Incidental Cyystotomy    Family History  Problem Relation Age of Onset  . Diabetes Mother   . Diabetes Sister   . Diabetes Brother   . Hypertension Brother   . Other Neg Hx   . Stroke Maternal Aunt     Social History  Substance Use Topics  . Smoking status: Never Smoker   . Smokeless tobacco: Never Used  . Alcohol Use: No    Allergies:  Allergies  Allergen Reactions  . Other Swelling    Allergic to peas.    Prescriptions prior to admission  Medication Sig Dispense Refill Last Dose  . acetaminophen (TYLENOL) 325 MG tablet Take 650 mg by mouth every 6 (six) hours as needed for mild pain or headache.   Past Week at Unknown time    Review of Systems  Constitutional: Negative for fever and malaise/fatigue.  Gastrointestinal: Positive for nausea, vomiting and constipation. Negative for abdominal pain and diarrhea.  Genitourinary: Negative for dysuria,  urgency, frequency and flank pain.       Neg - vaginal bleeding, discharge  Musculoskeletal: Positive for back pain.   Physical Exam   Blood pressure 112/74, pulse 72, temperature 97.6 F (36.4 C), temperature source Oral, resp. rate 18, height 5' 5.5" (1.664 m), weight 195 lb (88.451 kg), last menstrual period 09/27/2015.  Physical Exam  Nursing note and vitals reviewed. Constitutional: She is oriented to person, place, and time. She appears well-developed and well-nourished. No distress.  HENT:  Head: Normocephalic and atraumatic.  Cardiovascular: Normal rate.   Respiratory: Effort normal.  GI: Soft. She exhibits no distension and no mass. There is no tenderness. There is no rebound and no guarding.  Neurological: She is alert and oriented to person, place, and time.  Skin: Skin is warm and dry. No erythema.  Psychiatric: She has a normal mood and affect.    Results for orders placed or performed during the hospital encounter of 11/10/15 (from the past 24 hour(s))  Urinalysis, Routine w reflex microscopic (not at [facility])     Status: Abnormal   Collection Time: 11/10/15  7:15 PM  Result Value Ref Range   Color, Urine YELLOW YELLOW   APPearance CLEAR CLEAR   Specific Gravity, Urine  1.015 1.005 - 1.030   pH 6.5 5.0 - 8.0   Glucose, UA NEGATIVE NEGATIVE mg/dL   Hgb urine dipstick SMALL (A) NEGATIVE   Bilirubin Urine NEGATIVE NEGATIVE   Ketones, ur 15 (A) NEGATIVE mg/dL   Protein, ur NEGATIVE NEGATIVE mg/dL   Nitrite NEGATIVE NEGATIVE   Leukocytes, UA NEGATIVE NEGATIVE  Urine microscopic-add on     Status: Abnormal   Collection Time: 11/10/15  7:15 PM  Result Value Ref Range   Squamous Epithelial / LPF 6-30 (A) NONE SEEN   WBC, UA 0-5 0 - 5 WBC/hpf   RBC / HPF 0-5 0 - 5 RBC/hpf   Bacteria, UA RARE (A) NONE SEEN   Urine-Other YEAST PRESENT   Pregnancy, urine POC     Status: Abnormal   Collection Time: 11/10/15  7:20 PM  Result Value Ref Range   Preg Test, Ur POSITIVE (A)  NEGATIVE    MAU Course  Procedures None  MDM +UPT UA today 12.5 Phenergan PO and 20 mg Pepcid PO given Patient reports improvement in symptoms.  Assessment and Plan  A: Positive pregnancy test Nausea and vomiting in pregnancy prior to [redacted] weeks gestation Yeast vulvovaginitis Heartburn in pregnancy, first trimester  P: Discharge home Rx for Terazol cream, Pepcid and Phenergan given to patient  First trimester precautions discussed Patient advised to follow-up with [facility] to start prenatal care Patient may return to MAU as needed or if her condition were to change or worsen   Luvenia Redden, PA-C  11/10/2015, 8:42 PM

## 2015-11-10 NOTE — MAU Note (Signed)
Has been feeling sick, indigestion.  Went to health dept yesterday was told she is preg.  Also having pain in back

## 2015-11-23 ENCOUNTER — Ambulatory Visit: Payer: Medicaid Other | Admitting: Obstetrics

## 2015-11-25 ENCOUNTER — Inpatient Hospital Stay (HOSPITAL_COMMUNITY): Payer: Medicaid Other

## 2015-11-25 ENCOUNTER — Encounter (HOSPITAL_COMMUNITY): Payer: Self-pay | Admitting: *Deleted

## 2015-11-25 ENCOUNTER — Inpatient Hospital Stay (HOSPITAL_COMMUNITY)
Admission: AD | Admit: 2015-11-25 | Discharge: 2015-11-25 | Disposition: A | Discharge status: Home / Self Care | Payer: Medicaid Other | Source: Ambulatory Visit | Attending: Family Medicine | Admitting: Family Medicine

## 2015-11-25 DIAGNOSIS — O99611 Diseases of the digestive system complicating pregnancy, first trimester: Secondary | ICD-10-CM | POA: Diagnosis not present

## 2015-11-25 DIAGNOSIS — K219 Gastro-esophageal reflux disease without esophagitis: Secondary | ICD-10-CM | POA: Diagnosis not present

## 2015-11-25 DIAGNOSIS — Z3A08 8 weeks gestation of pregnancy: Secondary | ICD-10-CM | POA: Insufficient documentation

## 2015-11-25 DIAGNOSIS — O9989 Other specified diseases and conditions complicating pregnancy, childbirth and the puerperium: Secondary | ICD-10-CM

## 2015-11-25 DIAGNOSIS — O26899 Other specified pregnancy related conditions, unspecified trimester: Secondary | ICD-10-CM

## 2015-11-25 DIAGNOSIS — R102 Pelvic and perineal pain: Secondary | ICD-10-CM | POA: Diagnosis not present

## 2015-11-25 DIAGNOSIS — O219 Vomiting of pregnancy, unspecified: Secondary | ICD-10-CM

## 2015-11-25 DIAGNOSIS — R109 Unspecified abdominal pain: Secondary | ICD-10-CM | POA: Diagnosis not present

## 2015-11-25 DIAGNOSIS — R079 Chest pain, unspecified: Secondary | ICD-10-CM | POA: Diagnosis present

## 2015-11-25 LAB — URINE MICROSCOPIC-ADD ON
Bacteria, UA: NONE SEEN
RBC / HPF: NONE SEEN RBC/hpf (ref 0–5)
WBC UA: NONE SEEN WBC/hpf (ref 0–5)

## 2015-11-25 LAB — URINALYSIS, ROUTINE W REFLEX MICROSCOPIC
BILIRUBIN URINE: NEGATIVE
Glucose, UA: NEGATIVE mg/dL
Ketones, ur: 15 mg/dL — AB
Leukocytes, UA: NEGATIVE
NITRITE: NEGATIVE
PH: 5.5 (ref 5.0–8.0)
Protein, ur: NEGATIVE mg/dL
SPECIFIC GRAVITY, URINE: 1.015 (ref 1.005–1.030)

## 2015-11-25 LAB — CBC WITH DIFFERENTIAL/PLATELET
Basophils Absolute: 0 10*3/uL (ref 0.0–0.1)
Basophils Relative: 0 %
EOS ABS: 0.1 10*3/uL (ref 0.0–0.7)
Eosinophils Relative: 2 %
HEMATOCRIT: 35 % — AB (ref 36.0–46.0)
HEMOGLOBIN: 12.5 g/dL (ref 12.0–15.0)
LYMPHS ABS: 2.1 10*3/uL (ref 0.7–4.0)
LYMPHS PCT: 32 %
MCH: 30.9 pg (ref 26.0–34.0)
MCHC: 35.7 g/dL (ref 30.0–36.0)
MCV: 86.4 fL (ref 78.0–100.0)
MONOS PCT: 9 %
Monocytes Absolute: 0.6 10*3/uL (ref 0.1–1.0)
NEUTROS ABS: 3.7 10*3/uL (ref 1.7–7.7)
NEUTROS PCT: 57 %
Platelets: 325 10*3/uL (ref 150–400)
RBC: 4.05 MIL/uL (ref 3.87–5.11)
RDW: 12.9 % (ref 11.5–15.5)
WBC: 6.4 10*3/uL (ref 4.0–10.5)

## 2015-11-25 LAB — WET PREP, GENITAL
CLUE CELLS WET PREP: NONE SEEN
SPERM: NONE SEEN
Trich, Wet Prep: NONE SEEN

## 2015-11-25 LAB — HCG, QUANTITATIVE, PREGNANCY: HCG, BETA CHAIN, QUANT, S: 176728 m[IU]/mL — AB (ref <5)

## 2015-11-25 MED ORDER — GI COCKTAIL ~~LOC~~
30.0000 mL | Freq: Once | ORAL | Status: AC
  Administered 2015-11-25: 30 mL via ORAL
  Filled 2015-11-25: qty 30

## 2015-11-25 MED ORDER — FAMOTIDINE 20 MG PO TABS: 40.0000 mg | ORAL_TABLET | Freq: Two times a day (BID) | ORAL | Status: DC

## 2015-11-25 MED ORDER — METOCLOPRAMIDE HCL 10 MG PO TABS: 10.0000 mg | ORAL_TABLET | Freq: Four times a day (QID) | ORAL | Status: DC

## 2015-11-25 NOTE — Discharge Instructions (Signed)
Eating Plan for Hyperemesis Gravidarum Severe cases of hyperemesis gravidarum can lead to dehydration and malnutrition. The hyperemesis eating plan is one way to lessen the symptoms of nausea and vomiting. It is often used with prescribed medicines to control your symptoms.  WHAT CAN I DO TO RELIEVE MY SYMPTOMS? Listen to your body. Everyone is different and has different preferences. Find what works best for you. Some of the following things may help:  Eat and drink slowly.  Eat 5-6 small meals daily instead of 3 large meals.   Eat crackers before you get out of bed in the morning.   Starchy foods are usually well tolerated (such as cereal, toast, bread, potatoes, pasta, rice, and pretzels).   Ginger may help with nausea. Add  tsp ground ginger to hot tea or choose ginger tea.   Try drinking 100% fruit juice or an electrolyte drink.  Continue to take your prenatal vitamins as directed by your health care provider. If you are having trouble taking your prenatal vitamins, talk with your health care provider about different options.  Include at least 1 serving of protein with your meals and snacks (such as meats or poultry, beans, nuts, eggs, or yogurt). Try eating a protein-rich snack before bed (such as cheese and crackers or a half [place] or peanut butter sandwich). WHAT THINGS SHOULD I AVOID TO REDUCE MY SYMPTOMS? The following things may help reduce your symptoms:  Avoid foods with strong smells. Try eating meals in well-ventilated areas that are free of odors.  Avoid drinking water or other beverages with meals. Try not to drink anything less than 30 minutes before and after meals.  Avoid drinking more than 1 cup of fluid at a time.  Avoid fried or high-fat foods, such as butter and cream sauces.  Avoid spicy foods.  Avoid skipping meals the best you can. Nausea can be more intense on an empty stomach. If you cannot tolerate food at that time, do not force it. Try sucking on  ice chips or other frozen items and make up the calories later.  Avoid lying down within 2 hours after eating.   This information is not intended to replace advice given to you by your health care provider. Make sure you discuss any questions you have with your health care provider.   Document Released: 07/01/2007 Document Revised: 09/08/2013 Document Reviewed: 07/08/2013 Elsevier Interactive Patient Education 2016 [place] for Gastroesophageal Reflux Disease, Adult When you have gastroesophageal reflux disease (GERD), the foods you eat and your eating habits are very important. Choosing the right foods can help ease your discomfort.  WHAT GUIDELINES DO I NEED TO FOLLOW?   Choose fruits, vegetables, whole grains, and low-fat dairy products.   Choose low-fat meat, fish, and poultry.  Limit fats such as oils, salad dressings, butter, nuts, and avocado.   Keep a food diary. This helps you identify foods that cause symptoms.   Avoid foods that cause symptoms. These may be different for everyone.   Eat small meals often instead of 3 large meals a day.   Eat your meals slowly, in a place where you are relaxed.   Limit fried foods.   Cook foods using methods other than frying.   Avoid drinking alcohol.   Avoid drinking large amounts of liquids with your meals.   Avoid bending over or lying down until 2-3 hours after eating.  WHAT FOODS ARE NOT RECOMMENDED?  These are some foods and drinks that may make your  symptoms worse: Vegetables Tomatoes. Tomato juice. Tomato and spaghetti sauce. Chili peppers. Onion and garlic. Horseradish. Fruits Oranges, grapefruit, and lemon (fruit and juice). Meats High-fat meats, fish, and poultry. This includes hot dogs, ribs, ham, sausage, salami, and bacon. Dairy Whole milk and chocolate milk. Sour cream. Cream. Butter. Ice cream. Cream cheese.  Drinks Coffee and tea. Bubbly (carbonated) drinks or energy  drinks. Condiments Hot sauce. Barbecue sauce.  Sweets/Desserts Chocolate and cocoa. Donuts. Peppermint and spearmint. Fats and Oils High-fat foods. This includes [place] fries and potato chips. Other Vinegar. Strong spices. This includes black pepper, white pepper, red pepper, cayenne, curry powder, cloves, ginger, and chili powder. The items listed above may not be a complete list of foods and drinks to avoid. Contact your dietitian for more information.   This information is not intended to replace advice given to you by your health care provider. Make sure you discuss any questions you have with your health care provider.   Document Released: 03/04/2012 Document Revised: 09/24/2014 Document Reviewed: 07/08/2013 Elsevier Interactive Patient Education [employer].

## 2015-11-25 NOTE — MAU Provider Note (Signed)
History     CSN: [id number]  Arrival date and time: 11/25/15 1547   First Provider Initiated Contact with Patient 11/25/15 1613      Chief Complaint  Patient presents with  . Hot Flashes  . Gastroesophageal Reflux   HPI Michaela Moran is a 35 y.o. G3P2002 at [redacted]w[redacted]d who presents to MAU today with complaint of lower abdominal pain, N/V and heartburn. The patient was seen in MAU for N/V and heartburn a few weeks ago. She was given Pepcid and Phenergan. She states that she is taking them with minimal relief. She has not changed her diet. She states that her diet included caffeine, sodas and spicy food. She has vomiting usually once daily with occasional intermittent nausea otherwise. She has had bilateral lower abdominal pain for the last few days. She denies vaginal bleeding, discharge or fever.   OB History    Gravida Para Term Preterm AB TAB SAB Ectopic Multiple Living   3 2 2       2       Past Medical History  Diagnosis Date  . Acid reflux   . Hemorrhoid   . 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 Female circumcision   . Hypercholesteremia     Past Surgical History  Procedure Laterality Date  . Cesarean section    . Cesarean section N/A 01/02/2013    Procedure: CESAREAN SECTION;  Surgeon: Donnamae Jude, MD;  Location: [facility];  Service: Obstetrics;  Laterality: N/A;  Incidental Cyystotomy    Family History  Problem Relation Age of Onset  . Diabetes Mother   . Diabetes Sister   . Diabetes Brother   . Hypertension Brother   . Other Neg Hx   . Stroke Maternal Aunt     Social History  Substance Use Topics  . Smoking status: Never Smoker   . Smokeless tobacco: Never Used  . Alcohol Use: No    Allergies:  Allergies  Allergen Reactions  . Other Swelling    Allergic to peas.    Prescriptions prior to admission  Medication Sig Dispense Refill Last Dose  . famotidine (PEPCID) 20 MG tablet Take 1 tablet (20 mg total) by mouth 2 (two) times daily. 30 tablet 0 11/25/2015  at Unknown time  . promethazine (PHENERGAN) 12.5 MG tablet Take 1 tablet (12.5 mg total) by mouth every 6 (six) hours as needed for nausea or vomiting. 30 tablet 0 11/25/2015 at Unknown time  . terconazole (TERAZOL 3) 0.8 % vaginal cream Place 1 applicator vaginally at bedtime. 20 g 0 11/24/2015 at Unknown time    Review of Systems  Constitutional: Negative for fever and malaise/fatigue.  Gastrointestinal: Positive for heartburn, nausea and abdominal pain. Negative for vomiting, diarrhea and constipation.  Genitourinary: Negative for dysuria, urgency and frequency.       Neg - vaginal bleeding, discharge   Physical Exam   Blood pressure 107/80, pulse 79, temperature 97.9 F (36.6 C), resp. rate 18, last menstrual period 09/27/2015.  Physical Exam  Nursing note and vitals reviewed. Constitutional: She is oriented to person, place, and time. She appears well-developed and well-nourished. No distress.  HENT:  Head: Normocephalic and atraumatic.  Cardiovascular: Normal rate.   Respiratory: Effort normal.  GI: Soft. She exhibits no distension and no mass. There is tenderness (mild tenderness to palpation to the lower abdomen bilaterally). There is no rebound and no guarding.  Genitourinary: Uterus is not enlarged and not tender. Cervix exhibits no  motion tenderness, no discharge and no friability. Right adnexum displays tenderness. Right adnexum displays no mass. Left adnexum displays tenderness (mild). Left adnexum displays no mass. Fullness: mild. No bleeding in the vagina. Vaginal discharge (scant white discharge) found.  Neurological: She is alert and oriented to person, place, and time.  Skin: Skin is warm and dry. No erythema.  Psychiatric: She has a normal mood and affect.    Results for orders placed or performed during the hospital encounter of 11/25/15 (from the past 24 hour(s))  Urinalysis, Routine w reflex microscopic (not at [facility])     Status: Abnormal   Collection Time: 11/25/15   3:37 PM  Result Value Ref Range   Color, Urine YELLOW YELLOW   APPearance CLEAR CLEAR   Specific Gravity, Urine 1.015 1.005 - 1.030   pH 5.5 5.0 - 8.0   Glucose, UA NEGATIVE NEGATIVE mg/dL   Hgb urine dipstick SMALL (A) NEGATIVE   Bilirubin Urine NEGATIVE NEGATIVE   Ketones, ur 15 (A) NEGATIVE mg/dL   Protein, ur NEGATIVE NEGATIVE mg/dL   Nitrite NEGATIVE NEGATIVE   Leukocytes, UA NEGATIVE NEGATIVE  Urine microscopic-add on     Status: Abnormal   Collection Time: 11/25/15  3:37 PM  Result Value Ref Range   Squamous Epithelial / LPF 0-5 (A) NONE SEEN   WBC, UA NONE SEEN 0 - 5 WBC/hpf   RBC / HPF NONE SEEN 0 - 5 RBC/hpf   Bacteria, UA NONE SEEN NONE SEEN   Urine-Other YEAST   Wet prep, genital     Status: Abnormal   Collection Time: 11/25/15  4:40 PM  Result Value Ref Range   Yeast Wet Prep HPF POC PRESENT (A) NONE SEEN   Trich, Wet Prep NONE SEEN NONE SEEN   Clue Cells Wet Prep HPF POC NONE SEEN NONE SEEN   WBC, Wet Prep HPF POC FEW (A) NONE SEEN   Sperm NONE SEEN   CBC with Differential/Platelet     Status: Abnormal   Collection Time: 11/25/15  4:57 PM  Result Value Ref Range   WBC 6.4 4.0 - 10.5 K/uL   RBC 4.05 3.87 - 5.11 MIL/uL   Hemoglobin 12.5 12.0 - 15.0 g/dL   HCT 35.0 (L) 36.0 - 46.0 %   MCV 86.4 78.0 - 100.0 fL   MCH 30.9 26.0 - 34.0 pg   MCHC 35.7 30.0 - 36.0 g/dL   RDW 12.9 11.5 - 15.5 %   Platelets 325 150 - 400 K/uL   Neutrophils Relative % 57 %   Neutro Abs 3.7 1.7 - 7.7 K/uL   Lymphocytes Relative 32 %   Lymphs Abs 2.1 0.7 - 4.0 K/uL   Monocytes Relative 9 %   Monocytes Absolute 0.6 0.1 - 1.0 K/uL   Eosinophils Relative 2 %   Eosinophils Absolute 0.1 0.0 - 0.7 K/uL   Basophils Relative 0 %   Basophils Absolute 0.0 0.0 - 0.1 K/uL   US Ob Comp Less 14 Wks  11/25/2015  CLINICAL DATA:  Pelvic pain affecting pregnancy. EXAM: OBSTETRIC <14 WK ULTRASOUND TECHNIQUE: Transabdominal ultrasound was performed for evaluation of the gestation as well as the  maternal uterus and adnexal regions. COMPARISON:  None. FINDINGS: Intrauterine gestational sac: Visualized/normal in shape. Yolk sac:  Present Embryo:  Present Cardiac Activity: Present Heart Rate: 178 bpm MSD:   mm    w     d CRL:   2.3  mm   9 w 0 d  [place] EDC: 06/29/2016 Subchorionic hemorrhage:  None visualized. Maternal uterus/adnexae: Negative IMPRESSION: Single living intrauterine pregnancy 9 weeks 0 day without significant abnormality. Electronically Signed   By: Franchot Gallo M.D.   On: 11/25/2015 17:28    MAU Course  Procedures None  MDM UA, wet prep, GC/chlamydia, CBC, quant hCG, HIV, RPR and [place] today to rule out ectopic pregnancy  Assessment and Plan  A: SIUP at [redacted]w[redacted]d GERD N/V in pregnancy prior to [redacted] weeks gestation  P: Discharge home Rx for Pepcid (increased dose) and Reglan given to patient Discussed at length diet for N/V and GERD First trimester precautions discussed Patient advised to follow-up with [facility] as planned to start prenatal care Patient may return to MAU as needed or if her condition were to change or worsen  Luvenia Redden, PA-C  11/25/2015, 5:39 PM

## 2015-11-25 NOTE — MAU Note (Signed)
Pt stated the last few days she has felt hot and having some pain in her sides. Also feeling like burning in her chest when she eats. She feels worried about pregnancy so she came in.

## 2015-11-25 NOTE — MAU Provider Note (Signed)
S: Michaela Moran is a 35 y.o. G3P2002 at [redacted]w[redacted]d with a past medical history significant for GERD and C/S 2x presenting with burning chest pain, pelvic pain and fatigue. The pain began 11/07/15 and she sought treatment on 11/10/15. Patient was prescribed famotidine 20mg  bid on 11/10/15 but this has not alleviated the pain. Patient reports that this pain occurs only during pregnancy, located, at the sternum and continuing superiorly within the esophagus. Denies sharp pain or dyspnea. It presents primarily after meals, and patient states that the [place] food she consumes is "spicy". Cold water and ice have been helpful in pain mitigation. Her pelvic pain began 3 days ago with associated nausea and vomiting. Denies hematemesis. The pain encompasses the right, middle, and left pelvis. Patient denies any vaginal bleeding or discharge.   O:  Filed Vitals:   11/25/15 1606  BP: 107/80  Pulse: 79  Temp: 97.9 F (36.6 C)  Resp: 18   No current facility-administered medications on file prior to encounter.   Current Outpatient Prescriptions on File Prior to Encounter  Medication Sig Dispense Refill  . famotidine (PEPCID) 20 MG tablet Take 1 tablet (20 mg total) by mouth 2 (two) times daily. 30 tablet 0  . promethazine (PHENERGAN) 12.5 MG tablet Take 1 tablet (12.5 mg total) by mouth every 6 (six) hours as needed for nausea or vomiting. 30 tablet 0  . terconazole (TERAZOL 3) 0.8 % vaginal cream Place 1 applicator vaginally at bedtime. 20 g 0  . [DISCONTINUED] ferrous sulfate (FERROUSUL) 325 (65 FE) MG tablet Take 1 tablet (325 mg total) by mouth 2 (two) times daily. 60 tablet 3    A/P  In summary,  Michaela Moran is a 35 y.o. G3P2002 at [redacted]w[redacted]d with a past medical history significant for GERD and C/S 2x presenting with GERD symptoms, pelvic pain, fatigue.  1) GERD - increase dosage of famotidine to 40mg  bid. Counsel patient to limit her spicy food intake. 2) pelvic pain - CBC, U/S, G/C swab for infection, wet mount, ASO.  If U/S is inconclusive obtain hCG and have patient return in 48 hours for repeat measurement. 3) fatigue - assess Hgb/HCT via CBC

## 2015-11-26 LAB — RPR: RPR: NONREACTIVE

## 2015-11-26 LAB — HIV ANTIBODY (ROUTINE TESTING W REFLEX): HIV Screen 4th Generation wRfx: NONREACTIVE

## 2015-11-28 LAB — GC/CHLAMYDIA PROBE AMP (~~LOC~~) NOT AT ARMC
Chlamydia: NEGATIVE
Neisseria Gonorrhea: NEGATIVE

## 2015-12-04 ENCOUNTER — Encounter (HOSPITAL_COMMUNITY): Payer: Self-pay

## 2015-12-04 ENCOUNTER — Inpatient Hospital Stay (HOSPITAL_COMMUNITY)
Admission: AD | Admit: 2015-12-04 | Discharge: 2015-12-04 | Disposition: A | Discharge status: Home / Self Care | Payer: Medicaid Other | Source: Ambulatory Visit | Attending: Obstetrics & Gynecology | Admitting: Obstetrics & Gynecology

## 2015-12-04 DIAGNOSIS — J069 Acute upper respiratory infection, unspecified: Secondary | ICD-10-CM | POA: Diagnosis not present

## 2015-12-04 DIAGNOSIS — R05 Cough: Secondary | ICD-10-CM | POA: Diagnosis present

## 2015-12-04 DIAGNOSIS — R059 Cough, unspecified: Secondary | ICD-10-CM

## 2015-12-04 DIAGNOSIS — Z8249 Family history of ischemic heart disease and other diseases of the circulatory system: Secondary | ICD-10-CM | POA: Diagnosis not present

## 2015-12-04 DIAGNOSIS — O99511 Diseases of the respiratory system complicating pregnancy, first trimester: Secondary | ICD-10-CM | POA: Insufficient documentation

## 2015-12-04 DIAGNOSIS — O99611 Diseases of the digestive system complicating pregnancy, first trimester: Secondary | ICD-10-CM | POA: Insufficient documentation

## 2015-12-04 DIAGNOSIS — O9989 Other specified diseases and conditions complicating pregnancy, childbirth and the puerperium: Secondary | ICD-10-CM | POA: Insufficient documentation

## 2015-12-04 DIAGNOSIS — K219 Gastro-esophageal reflux disease without esophagitis: Secondary | ICD-10-CM | POA: Insufficient documentation

## 2015-12-04 DIAGNOSIS — Z3A09 9 weeks gestation of pregnancy: Secondary | ICD-10-CM | POA: Diagnosis not present

## 2015-12-04 DIAGNOSIS — Z833 Family history of diabetes mellitus: Secondary | ICD-10-CM | POA: Diagnosis not present

## 2015-12-04 LAB — URINALYSIS, ROUTINE W REFLEX MICROSCOPIC
Bilirubin Urine: NEGATIVE
GLUCOSE, UA: NEGATIVE mg/dL
Ketones, ur: NEGATIVE mg/dL
LEUKOCYTES UA: NEGATIVE
Nitrite: NEGATIVE
PH: 5 (ref 5.0–8.0)
PROTEIN: NEGATIVE mg/dL
Specific Gravity, Urine: <1.005 — ABNORMAL LOW (ref 1.005–1.030)

## 2015-12-04 LAB — CBC
HCT: 32.9 % — ABNORMAL LOW (ref 36.0–46.0)
Hemoglobin: 11.9 g/dL — ABNORMAL LOW (ref 12.0–15.0)
MCH: 30.7 pg (ref 26.0–34.0)
MCHC: 36.2 g/dL — ABNORMAL HIGH (ref 30.0–36.0)
MCV: 84.8 fL (ref 78.0–100.0)
Platelets: 276 10*3/uL (ref 150–400)
RBC: 3.88 MIL/uL (ref 3.87–5.11)
RDW: 13 % (ref 11.5–15.5)
WBC: 6.7 10*3/uL (ref 4.0–10.5)

## 2015-12-04 LAB — URINE MICROSCOPIC-ADD ON: WBC UA: NONE SEEN WBC/hpf (ref 0–5)

## 2015-12-04 MED ORDER — BENZONATATE 200 MG PO CAPS: 200.0000 mg | ORAL_CAPSULE | Freq: Three times a day (TID) | ORAL | Status: DC

## 2015-12-04 MED ORDER — BENZONATATE 100 MG PO CAPS
200.0000 mg | ORAL_CAPSULE | Freq: Once | ORAL | Status: AC
  Administered 2015-12-04: 200 mg via ORAL
  Filled 2015-12-04: qty 2

## 2015-12-04 NOTE — Discharge Instructions (Signed)
Cough, Adult A cough helps to clear your throat and lungs. A cough may last only 2-3 weeks (acute), or it may last longer than 8 weeks (chronic). Many different things can cause a cough. A cough may be a sign of an illness or another medical condition. HOME CARE  Pay attention to any changes in your cough.  Take medicines only as told by your doctor.  If you were prescribed an antibiotic medicine, take it as told by your doctor. Do not stop taking it even if you start to feel better.  Talk with your doctor before you try using a cough medicine.  Drink enough fluid to keep your pee (urine) clear or pale yellow.  If the air is dry, use a cold steam vaporizer or humidifier in your home.  Stay away from things that make you cough at work or at home.  If your cough is worse at night, try using extra pillows to raise your head up higher while you sleep.  Do not smoke, and try not to be around smoke. If you need help quitting, ask your doctor.  Do not have caffeine.  Do not drink alcohol.  Rest as needed. GET HELP IF:  You have new problems (symptoms).  You cough up yellow fluid (pus).  Your cough does not get better after 2-3 weeks, or your cough gets worse.  Medicine does not help your cough and you are not sleeping well.  You have pain that gets worse or pain that is not helped with medicine.  You have a fever.  You are losing weight and you do not know why.  You have night sweats. GET HELP RIGHT AWAY IF:  You cough up blood.  You have trouble breathing.  Your heartbeat is very fast.   This information is not intended to replace advice given to you by your health care provider. Make sure you discuss any questions you have with your health care provider.   Document Released: 05/17/2011 Document Revised: 05/25/2015 Document Reviewed: 11/10/2014 Elsevier Interactive Patient Education 2016 [place] may help relieve the symptoms  of a cough and cold. They add moisture to the air, which helps mucus to become thinner and less sticky. This makes it easier to breathe and cough up secretions. Cool mist vaporizers do not cause serious burns like hot mist vaporizers, which may also be called steamers or humidifiers. Vaporizers have not been proven to help with colds. You should not use a vaporizer if you are allergic to mold. HOME CARE INSTRUCTIONS  Follow the package instructions for the vaporizer.  Do not use anything other than distilled water in the vaporizer.  Do not run the vaporizer all of the time. This can cause mold or bacteria to grow in the vaporizer.  Clean the vaporizer after each time it is used.  Clean and dry the vaporizer well before storing it.  Stop using the vaporizer if worsening respiratory symptoms develop.   This information is not intended to replace advice given to you by your health care provider. Make sure you discuss any questions you have with your health care provider.   Document Released: 05/31/2004 Document Revised: 09/08/2013 Document Reviewed: 01/21/2013 Elsevier Interactive Patient Education 2016 [place] Medications in Pregnancy   Acne: Benzoyl Peroxide Salicylic Acid  Backache/Headache: Tylenol: 2 regular strength every 4 hours OR              2 Extra strength every 6 hours  Colds/Coughs/Allergies: Benadryl (alcohol free) 25 mg every 6 hours as needed Breath right strips Claritin Cepacol throat lozenges Chloraseptic throat spray Cold-Eeze- up to three times per day Cough drops, alcohol free Flonase (by prescription only) Guaifenesin Mucinex Robitussin DM (plain only, alcohol free) Saline nasal spray/drops Sudafed (pseudoephedrine) & Actifed ** use only after [redacted] weeks gestation and if you do not have high blood pressure Tylenol Vicks Vaporub Zinc lozenges Zyrtec   Constipation: Colace Ducolax suppositories Fleet enema Glycerin  suppositories Metamucil Milk of magnesia Miralax Senokot Smooth move tea  Diarrhea: Kaopectate Imodium A-D  *NO pepto Bismol  Hemorrhoids: Anusol Anusol HC Preparation H Tucks  Indigestion: Tums Maalox Mylanta Zantac  Pepcid  Insomnia: Benadryl (alcohol free) 25mg  every 6 hours as needed Tylenol PM Unisom, no Gelcaps  Leg Cramps: Tums MagGel  Nausea/Vomiting:  Bonine Dramamine Emetrol Ginger extract Sea bands Meclizine  Nausea medication to take during pregnancy:  Unisom (doxylamine succinate 25 mg tablets) Take one tablet daily at bedtime. If symptoms are not adequately controlled, the dose can be increased to a maximum recommended dose of two tablets daily (1/2 tablet in the morning, 1/2 tablet mid-afternoon and one at bedtime). Vitamin B6 100mg  tablets. Take one tablet twice a day (up to 200 mg per day).  Skin Rashes: Aveeno products Benadryl cream or 25mg  every 6 hours as needed Calamine Lotion 1% cortisone cream  Yeast infection: Gyne-lotrimin 7 Monistat 7   **If taking multiple medications, please check labels to avoid duplicating the same active ingredients **take medication as directed on the label ** Do not exceed 4000 mg of tylenol in 24 hours **Do not take medications that contain aspirin or ibuprofen

## 2015-12-04 NOTE — MAU Note (Addendum)
Someone visited her home and had a cough.  Then pt's child got a cough.  Then yesterday, started coughing and coughed so hard vomited.  Has a headache. Felt burning in throat. Runny nose. Watery eyes.  No fever.  No vaginal bleeding.

## 2015-12-04 NOTE — MAU Provider Note (Signed)
  History     CSN: [id number]  Arrival date and time: 12/04/15 0137   First Provider Initiated Contact with Patient 12/04/15 0242      Chief Complaint  Patient presents with  . Cough   HPI  Michaela Moran 35 y.o. [id number] @[redacted]w[redacted]d  presents to the MAU with the complaint of nasal congestion that is clear with streaks of yellow and cough. Denies fever.   Past Medical History  Diagnosis Date  . Acid reflux   . Hemorrhoid   . 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 Female circumcision   . Hypercholesteremia     Past Surgical History  Procedure Laterality Date  . Cesarean section    . Cesarean section N/A 01/02/2013    Procedure: CESAREAN SECTION;  Surgeon: Donnamae Jude, MD;  Location: [facility];  Service: Obstetrics;  Laterality: N/A;  Incidental Cyystotomy    Family History  Problem Relation Age of Onset  . Diabetes Mother   . Diabetes Sister   . Diabetes Brother   . Hypertension Brother   . Other Neg Hx   . Stroke Maternal Aunt     Social History  Substance Use Topics  . Smoking status: Never Smoker   . Smokeless tobacco: Never Used  . Alcohol Use: No    Allergies:  Allergies  Allergen Reactions  . Other Swelling    Allergic to peas.    Prescriptions prior to admission  Medication Sig Dispense Refill Last Dose  . famotidine (PEPCID) 20 MG tablet Take 2 tablets (40 mg total) by mouth 2 (two) times daily. 60 tablet 1 12/03/2015 at Unknown time  . metoCLOPramide (REGLAN) 10 MG tablet Take 1 tablet (10 mg total) by mouth every 6 (six) hours. 30 tablet 0 12/03/2015 at Unknown time  . promethazine (PHENERGAN) 12.5 MG tablet Take 1 tablet (12.5 mg total) by mouth every 6 (six) hours as needed for nausea or vomiting. 30 tablet 0 12/03/2015 at Unknown time  . terconazole (TERAZOL 3) 0.8 % vaginal cream Place 1 applicator vaginally at bedtime. 20 g 0 11/24/2015 at Unknown time    Review of Systems  Constitutional: Negative for fever.  HENT: Positive for congestion.   Respiratory:  Positive for cough and sputum production.   All other systems reviewed and are negative.  Physical Exam   Blood pressure 111/71, pulse 85, temperature 98.3 F (36.8 C), temperature source Oral, resp. rate 16, last menstrual period 09/27/2015, SpO2 98 %.  Physical Exam  Nursing note and vitals reviewed. Constitutional: She is oriented to person, place, and time. She appears well-developed and well-nourished.  HENT:  Head: Normocephalic and atraumatic.  Cardiovascular: Normal rate and regular rhythm.   Respiratory: Effort normal and breath sounds normal. No respiratory distress. She has no wheezes. She has no rales.  GI: Soft. She exhibits no distension. There is no tenderness.  Musculoskeletal: Normal range of motion.  Neurological: She is alert and oriented to person, place, and time.  Skin: Skin is warm and dry.  Psychiatric: She has a normal mood and affect. Her behavior is normal. Judgment and thought content normal.    MAU Course  Procedures     Assessment and Plan  URI Cough Tessalon Perls 200mg  #30 Discharge  Michaela Moran,Michaela Moran 12/04/2015, 2:46 AM

## 2015-12-05 ENCOUNTER — Ambulatory Visit: Payer: Medicaid Other | Admitting: Obstetrics

## 2015-12-05 ENCOUNTER — Encounter: Payer: Self-pay | Admitting: Obstetrics

## 2015-12-05 ENCOUNTER — Ambulatory Visit (INDEPENDENT_AMBULATORY_CARE_PROVIDER_SITE_OTHER): Payer: Medicaid Other | Admitting: Obstetrics

## 2015-12-05 VITALS — BP 104/73 | HR 94 | Temp 98.3°F | Wt 188.0 lb

## 2015-12-05 DIAGNOSIS — K219 Gastro-esophageal reflux disease without esophagitis: Secondary | ICD-10-CM

## 2015-12-05 DIAGNOSIS — Z3481 Encounter for supervision of other normal pregnancy, first trimester: Secondary | ICD-10-CM | POA: Diagnosis not present

## 2015-12-05 LAB — POCT URINALYSIS DIPSTICK
Bilirubin, UA: NEGATIVE
Blood, UA: NEGATIVE
Glucose, UA: NEGATIVE
Ketones, UA: NEGATIVE
LEUKOCYTES UA: NEGATIVE
NITRITE UA: NEGATIVE
PROTEIN UA: NEGATIVE
SPEC GRAV UA: 1.025
UROBILINOGEN UA: NEGATIVE
pH, UA: 5

## 2015-12-05 MED ORDER — VITAFOL-NANO 18-0.6-0.4 MG PO TABS: 1.0000 | ORAL_TABLET | Freq: Every day | ORAL | Status: DC

## 2015-12-05 MED ORDER — METOCLOPRAMIDE HCL 10 MG PO TABS: 10.0000 mg | ORAL_TABLET | Freq: Three times a day (TID) | ORAL | Status: DC | PRN

## 2015-12-05 MED ORDER — OMEPRAZOLE 20 MG PO CPDR: 20.0000 mg | DELAYED_RELEASE_CAPSULE | Freq: Two times a day (BID) | ORAL | Status: DC

## 2015-12-06 LAB — PRENATAL PROFILE I(LABCORP)
ANTIBODY SCREEN: NEGATIVE
BASOS ABS: 0 10*3/uL (ref 0.0–0.2)
BASOS: 1 %
EOS (ABSOLUTE): 0.3 10*3/uL (ref 0.0–0.4)
Eos: 4 %
HEMATOCRIT: 37.2 % (ref 34.0–46.6)
HEMOGLOBIN: 12.9 g/dL (ref 11.1–15.9)
Hepatitis B Surface Ag: NEGATIVE
Immature Grans (Abs): 0 10*3/uL (ref 0.0–0.1)
Immature Granulocytes: 0 %
LYMPHS ABS: 1.8 10*3/uL (ref 0.7–3.1)
Lymphs: 32 %
MCH: 30.5 pg (ref 26.6–33.0)
MCHC: 34.7 g/dL (ref 31.5–35.7)
MCV: 88 fL (ref 79–97)
MONOS ABS: 0.6 10*3/uL (ref 0.1–0.9)
Monocytes: 11 %
NEUTROS ABS: 3 10*3/uL (ref 1.4–7.0)
Neutrophils: 52 %
PLATELETS: 333 10*3/uL (ref 150–379)
RBC: 4.23 x10E6/uL (ref 3.77–5.28)
RDW: 13.8 % (ref 12.3–15.4)
RPR: NONREACTIVE
Rh Factor: POSITIVE
Rubella Antibodies, IGG: 12.6 index (ref >0.99)
WBC: 5.6 10*3/uL (ref 3.4–10.8)

## 2015-12-06 LAB — HEMOGLOBINOPATHY EVALUATION
HGB C: 0 %
HGB S: 0 %
Hemoglobin A2 Quantitation: 2.6 % (ref 0.7–3.1)
Hemoglobin F Quantitation: 0 % (ref 0.0–2.0)
Hgb A: 97.4 % (ref 94.0–98.0)

## 2015-12-06 LAB — VARICELLA ZOSTER ANTIBODY, IGG: Varicella zoster IgG: 447 index (ref >165)

## 2015-12-06 LAB — VITAMIN D 25 HYDROXY (VIT D DEFICIENCY, FRACTURES): Vit D, 25-Hydroxy: 12.7 ng/mL — ABNORMAL LOW (ref 30.0–100.0)

## 2015-12-06 LAB — HIV ANTIBODY (ROUTINE TESTING W REFLEX): HIV Screen 4th Generation wRfx: NONREACTIVE

## 2015-12-07 LAB — URINE CULTURE, OB REFLEX

## 2015-12-07 LAB — CULTURE, OB URINE

## 2015-12-08 ENCOUNTER — Encounter: Payer: Self-pay | Admitting: Obstetrics

## 2015-12-08 NOTE — Progress Notes (Signed)
Subjective:    Michaela Moran is being seen today for her first obstetrical visit.  This is a planned pregnancy. She is at [redacted]w[redacted]d gestation. Her obstetrical history is significant for advanced maternal age. Relationship with FOB: spouse, living together. Patient does intend to breast feed. Pregnancy history fully reviewed.  The information documented in the HPI was reviewed and verified.  Menstrual History: OB History    Gravida Para Term Preterm AB TAB SAB Ectopic Multiple Living   3 2 2       2        Patient's last menstrual period was 09/27/2015.    Past Medical History  Diagnosis Date  . Acid reflux   . Hemorrhoid   . 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 Female circumcision   . Hypercholesteremia     Past Surgical History  Procedure Laterality Date  . Cesarean section    . Cesarean section N/A 01/02/2013    Procedure: CESAREAN SECTION;  Surgeon: Donnamae Jude, MD;  Location: [facility];  Service: Obstetrics;  Laterality: N/A;  Incidental Cyystotomy     (Not in a hospital admission) Allergies  Allergen Reactions  . Other Swelling    Allergic to peas.    Social History  Substance Use Topics  . Smoking status: Never Smoker   . Smokeless tobacco: Never Used  . Alcohol Use: No    Family History  Problem Relation Age of Onset  . Diabetes Mother   . Diabetes Sister   . Diabetes Brother   . Hypertension Brother   . Other Neg Hx   . Stroke Maternal Aunt      Review of Systems Constitutional: negative for weight loss Gastrointestinal: negative for vomiting Genitourinary:negative for genital lesions and vaginal discharge and dysuria Musculoskeletal:negative for back pain Behavioral/Psych: negative for abusive relationship, depression, illegal drug usage and tobacco use    Objective:    BP 104/73 mmHg  Pulse 94  Temp(Src) 98.3 F (36.8 C)  Wt 188 lb (85.276 kg)  LMP 09/27/2015 General Appearance:    Alert, cooperative, no distress, appears stated age  Head:     Normocephalic, without obvious abnormality, atraumatic  Eyes:    PERRL, conjunctiva/corneas clear, EOM's intact, fundi    benign, both eyes  Ears:    Normal TM's and external ear canals, both ears  Nose:   Nares normal, septum midline, mucosa normal, no drainage    or sinus tenderness  Throat:   Lips, mucosa, and tongue normal; teeth and gums normal  Neck:   Supple, symmetrical, trachea midline, no adenopathy;    thyroid:  no enlargement/tenderness/nodules; no carotid   bruit or JVD  Back:     Symmetric, no curvature, ROM normal, no CVA tenderness  Lungs:     Clear to auscultation bilaterally, respirations unlabored  Chest Wall:    No tenderness or deformity   Heart:    Regular rate and rhythm, S1 and S2 normal, no murmur, rub   or gallop  Breast Exam:    No tenderness, masses, or nipple abnormality  Abdomen:     Soft, non-tender, bowel sounds active all four quadrants,    no masses, no organomegaly  Genitalia:    Normal female without lesion, discharge or tenderness  Extremities:   Extremities normal, atraumatic, no cyanosis or edema  Pulses:   2+ and symmetric all extremities  Skin:   Skin color, texture, turgor normal, no rashes or lesions  Lymph nodes:  Cervical, supraclavicular, and axillary nodes normal  Neurologic:   CNII-XII intact, normal strength, sensation and reflexes    throughout      Lab Review Urine pregnancy test Labs reviewed yes Radiologic studies reviewed no Assessment:    Pregnancy at [redacted]w[redacted]d weeks    Plan:      Prenatal vitamins.  Counseling provided regarding continued use of seat belts, cessation of alcohol consumption, smoking or use of illicit drugs; infection precautions i.e., influenza/TDAP immunizations, toxoplasmosis,CMV, parvovirus, listeria and varicella; workplace safety, exercise during pregnancy; routine dental care, safe medications, sexual activity, hot tubs, saunas, pools, travel, caffeine use, fish and methlymercury, potential toxins, hair  treatments, varicose veins Weight gain recommendations per IOM guidelines reviewed: underweight/BMI< 18.5--> gain 28 - 40 lbs; normal weight/BMI 18.5 - 24.9--> gain 25 - 35 lbs; overweight/BMI 25 - 29.9--> gain 15 - 25 lbs; obese/BMI >30->gain  11 - 20 lbs Problem list reviewed and updated. FIRST/CF mutation testing/NIPT/QUAD SCREEN/fragile X/Ashkenazi Jewish population testing/Spinal muscular atrophy discussed: requested. Role of ultrasound in pregnancy discussed; fetal survey: requested. Amniocentesis discussed: not indicated. VBAC calculator score: VBAC consent form provided Meds ordered this encounter  Medications  . metoCLOPramide (REGLAN) 10 MG tablet    Sig: Take 1 tablet (10 mg total) by mouth every 8 (eight) hours as needed for nausea or vomiting.    Dispense:  30 tablet    Refill:  2  . Prenatal-Fe Fum-Methf-FA w/o A (VITAFOL-NANO) 18-0.6-0.4 MG TABS    Sig: Take 1 tablet by mouth daily before breakfast.    Dispense:  30 tablet    Refill:  5  . omeprazole (PRILOSEC) 20 MG capsule    Sig: Take 1 capsule (20 mg total) by mouth 2 (two) times daily before a meal.    Dispense:  60 capsule    Refill:  5   Orders Placed This Encounter  Procedures  . Culture, OB Urine  . Result  . HIV antibody  . Hemoglobinopathy evaluation  . Varicella zoster antibody, IgG  . VITAMIN D 25 Hydroxy (Vit-D Deficiency, Fractures)  . Prenatal Profile I  . POCT urinalysis dipstick    Follow up in 4 weeks.

## 2015-12-12 ENCOUNTER — Telehealth: Payer: Self-pay | Admitting: Obstetrics

## 2015-12-12 NOTE — Telephone Encounter (Signed)
Attempt to call pt back for medical advice( over the counter cough med)  but phone is disconnected or out of services. Pt phone number [phone].   [place]

## 2016-01-03 ENCOUNTER — Ambulatory Visit (INDEPENDENT_AMBULATORY_CARE_PROVIDER_SITE_OTHER): Payer: Medicaid Other | Admitting: Obstetrics

## 2016-01-03 VITALS — BP 100/71 | HR 69 | Temp 97.8°F | Wt 185.8 lb

## 2016-01-03 DIAGNOSIS — Z3492 Encounter for supervision of normal pregnancy, unspecified, second trimester: Secondary | ICD-10-CM

## 2016-01-03 LAB — POCT URINALYSIS DIPSTICK
BILIRUBIN UA: NEGATIVE
Blood, UA: 250
Glucose, UA: NEGATIVE
KETONES UA: NEGATIVE
LEUKOCYTES UA: NEGATIVE
NITRITE UA: NEGATIVE
Spec Grav, UA: 1.02
Urobilinogen, UA: NEGATIVE
pH, UA: 5

## 2016-01-03 NOTE — Progress Notes (Signed)
Patient is having constipation- without relief. She is fatigued and has questions about her vitamin D. Patient also is having back-? Related to constipation.

## 2016-01-04 ENCOUNTER — Encounter: Payer: Self-pay | Admitting: Obstetrics

## 2016-01-04 NOTE — Progress Notes (Signed)
  Subjective:    Michaela Moran is a 35 y.o. female being seen today for her obstetrical visit. She is at [redacted]w[redacted]d gestation. Patient reports: no complaints.  Problem List Items Addressed This Visit    None    Visit Diagnoses    Prenatal care, first trimester    -  Primary    Relevant Orders    POCT urinalysis dipstick (Completed)      Patient Active Problem List   Diagnosis Date Noted  . S/P cesarean section 01/05/2013  . Previous cesarean delivery, antepartum condition or complication 123XX123  . Arrest of descent, delivered, current hospitalization 01/02/2013    Objective:     BP 100/71 mmHg  Pulse 69  Temp(Src) 97.8 F (36.6 C)  Wt 185 lb 12.8 oz (84.278 kg)  LMP 09/27/2015 Uterine Size: Below umbilicus     Assessment:    Pregnancy @ [redacted]w[redacted]d  weeks Doing well    Plan:    Problem list reviewed and updated. Labs reviewed.  Follow up in 2 weeks. FIRST/CF mutation testing/NIPT/QUAD SCREEN/fragile X/Ashkenazi Jewish population testing/Spinal muscular atrophy discussed: requested. Role of ultrasound in pregnancy discussed; fetal survey: requested. Amniocentesis discussed: not indicated.

## 2016-01-19 ENCOUNTER — Other Ambulatory Visit: Payer: Self-pay | Admitting: Certified Nurse Midwife

## 2016-01-31 ENCOUNTER — Encounter: Payer: Medicaid Other | Admitting: Obstetrics

## 2016-02-06 ENCOUNTER — Encounter: Payer: Medicaid Other | Admitting: Obstetrics

## 2016-02-14 ENCOUNTER — Telehealth: Payer: Self-pay

## 2016-02-14 ENCOUNTER — Ambulatory Visit (INDEPENDENT_AMBULATORY_CARE_PROVIDER_SITE_OTHER): Payer: Medicaid Other | Admitting: Obstetrics

## 2016-02-14 ENCOUNTER — Encounter: Payer: Self-pay | Admitting: Obstetrics

## 2016-02-14 VITALS — BP 95/67 | HR 79 | Wt 188.0 lb

## 2016-02-14 DIAGNOSIS — O09522 Supervision of elderly multigravida, second trimester: Secondary | ICD-10-CM

## 2016-02-14 DIAGNOSIS — K219 Gastro-esophageal reflux disease without esophagitis: Secondary | ICD-10-CM

## 2016-02-14 DIAGNOSIS — Z3482 Encounter for supervision of other normal pregnancy, second trimester: Secondary | ICD-10-CM

## 2016-02-14 DIAGNOSIS — O219 Vomiting of pregnancy, unspecified: Secondary | ICD-10-CM

## 2016-02-14 LAB — POCT URINALYSIS DIPSTICK
BILIRUBIN UA: NEGATIVE
GLUCOSE UA: NEGATIVE
Ketones, UA: NEGATIVE
Leukocytes, UA: NEGATIVE
NITRITE UA: NEGATIVE
Protein, UA: NEGATIVE
Spec Grav, UA: 1.015
Urobilinogen, UA: NEGATIVE
pH, UA: 6

## 2016-02-14 MED ORDER — OMEPRAZOLE 20 MG PO CPDR: 20.0000 mg | DELAYED_RELEASE_CAPSULE | Freq: Two times a day (BID) | ORAL | Status: DC

## 2016-02-14 MED ORDER — METOCLOPRAMIDE HCL 10 MG PO TABS: 10.0000 mg | ORAL_TABLET | Freq: Three times a day (TID) | ORAL | Status: DC | PRN

## 2016-02-14 NOTE — Progress Notes (Signed)
Subjective:    Michaela Moran is a 35 y.o. female being seen today for her obstetrical visit. She is at [redacted]w[redacted]d gestation. Patient reports: nausea and heartburn . Fetal movement: normal.  Problem List Items Addressed This Visit    None    Visit Diagnoses    Encounter for supervision of other normal pregnancy in second trimester    -  Primary    Relevant Orders    POCT urinalysis dipstick (Completed)    AMB referral to maternal fetal medicine    AMB MFM GENETICS REFERRAL    [place] MFM OB COMP + 28 WK    [place] MFM OB Transvaginal    AMA (advanced maternal age) multigravida 35+, second trimester        Relevant Orders    AMB referral to maternal fetal medicine    AMB MFM GENETICS REFERRAL    [place] MFM OB COMP + 14 WK    [place] MFM OB Transvaginal    GERD without esophagitis        Relevant Medications    omeprazole (PRILOSEC) 20 MG capsule    metoCLOPramide (REGLAN) 10 MG tablet    Nausea and vomiting during pregnancy prior to [redacted] weeks gestation        Relevant Medications    metoCLOPramide (REGLAN) 10 MG tablet      Patient Active Problem List   Diagnosis Date Noted  . S/P cesarean section 01/05/2013  . Previous cesarean delivery, antepartum condition or complication 123XX123  . Arrest of descent, delivered, current hospitalization 01/02/2013   Objective:    BP 95/67 mmHg  Pulse 79  Wt 188 lb (85.276 kg)  LMP 09/27/2015 FHT: 150 BPM  Uterine Size: size equals dates     Assessment:    Pregnancy @ [redacted]w[redacted]d    Plan:    Signs and symptoms of preterm labor: discussed.  Labs, problem list reviewed and updated 2 hr GTT planned Follow up in 4 weeks.

## 2016-02-14 NOTE — Telephone Encounter (Signed)
NEED TO CHANGE ORDER FOR THIS IMG 7020 - MFM DETAIL - THANKS!

## 2016-02-16 NOTE — Telephone Encounter (Signed)
Please order for [facility]

## 2016-02-17 ENCOUNTER — Other Ambulatory Visit: Payer: Self-pay | Admitting: *Deleted

## 2016-02-17 ENCOUNTER — Encounter (HOSPITAL_COMMUNITY): Payer: Self-pay | Admitting: Internal Medicine

## 2016-02-17 DIAGNOSIS — Z3492 Encounter for supervision of normal pregnancy, unspecified, second trimester: Secondary | ICD-10-CM

## 2016-02-24 ENCOUNTER — Encounter (HOSPITAL_COMMUNITY): Payer: 59

## 2016-02-24 ENCOUNTER — Other Ambulatory Visit (HOSPITAL_COMMUNITY): Payer: 59

## 2016-02-29 ENCOUNTER — Ambulatory Visit (HOSPITAL_COMMUNITY): Payer: 59

## 2016-02-29 ENCOUNTER — Ambulatory Visit (HOSPITAL_COMMUNITY)
Admission: RE | Admit: 2016-02-29 | Payer: 59 | Source: Ambulatory Visit | Attending: Internal Medicine | Admitting: Internal Medicine

## 2016-03-02 ENCOUNTER — Other Ambulatory Visit: Payer: Self-pay | Admitting: Certified Nurse Midwife

## 2016-03-13 ENCOUNTER — Encounter: Payer: Medicaid Other | Admitting: Obstetrics

## 2016-03-14 ENCOUNTER — Ambulatory Visit (HOSPITAL_COMMUNITY)
Admission: RE | Admit: 2016-03-14 | Discharge: 2016-03-14 | Disposition: A | Discharge status: Home / Self Care | Payer: Medicaid Other | Source: Ambulatory Visit | Attending: Obstetrics | Admitting: Obstetrics

## 2016-03-14 ENCOUNTER — Encounter: Payer: Medicaid Other | Admitting: Obstetrics

## 2016-03-14 ENCOUNTER — Encounter (HOSPITAL_COMMUNITY): Payer: Self-pay

## 2016-03-14 DIAGNOSIS — O09529 Supervision of elderly multigravida, unspecified trimester: Secondary | ICD-10-CM | POA: Insufficient documentation

## 2016-03-14 DIAGNOSIS — O34219 Maternal care for unspecified type scar from previous cesarean delivery: Secondary | ICD-10-CM | POA: Diagnosis not present

## 2016-03-14 DIAGNOSIS — Z3A24 24 weeks gestation of pregnancy: Secondary | ICD-10-CM | POA: Diagnosis not present

## 2016-03-14 DIAGNOSIS — Z3492 Encounter for supervision of normal pregnancy, unspecified, second trimester: Secondary | ICD-10-CM

## 2016-03-14 DIAGNOSIS — Z36 Encounter for antenatal screening of mother: Secondary | ICD-10-CM | POA: Insufficient documentation

## 2016-03-14 DIAGNOSIS — O09522 Supervision of elderly multigravida, second trimester: Secondary | ICD-10-CM | POA: Insufficient documentation

## 2016-03-14 NOTE — Progress Notes (Signed)
Genetic Counseling  High-Risk Gestation Note  Appointment Date:  03/14/2016 Referred By: Shelly Bombard, MD Date of Birth:  1980-11-03   Pregnancy History: [id number] Estimated Date of Delivery: 07/03/16 Estimated Gestational Age: [redacted]w[redacted]d Attending: Renella Cunas, MD   [name] and her husband were seen for genetic counseling because of a maternal age of 52.     In summary:  Discussed AMA and associated risk for fetal aneuploidy  Discussed options for screening  NIPS-declined  Ultrasound-performed today; no markers or anomalies visualized  Discussed diagnostic testing options  Amniocentesis-declined  Reviewed family history concerns  Discussed carrier screening options-declined  They were counseled regarding maternal age and the association with risk for chromosome conditions due to nondisjunction with aging of the ova.  We reviewed chromosomes, nondisjunction, and the associated 1 in 100 risk for fetal aneuploidy related to a maternal age of 39 at [redacted]w[redacted]d gestation. They were counseled that the risk for aneuploidy decreases as gestational age increases, accounting for those pregnancies which spontaneously abort.  We specifically discussed Down syndrome (trisomy 61), trisomies 13 and 18, and sex chromosome aneuploidies (47,XXX and 47,XXY) including the common features and prognoses of each.   We reviewed available screening options including noninvasive prenatal screening (NIPS)/cell free DNA (cfDNA) screening, and detailed ultrasound. They were counseled that screening tests are used to modify a patient's a priori risk for aneuploidy, typically based on age. This estimate provides a pregnancy specific risk assessment. We reviewed the benefits and limitations of each option. Specifically, we discussed the conditions for which each test screens, the detection rates, and false positive rates of each. They were also counseled regarding diagnostic testing via amniocentesis. We reviewed  the approximate 1 in [id number] risk for complications from amniocentesis, including spontaneous pregnancy loss. We discussed the possible results that the tests might provide including: positive, negative, unanticipated, and no result. Finally, they were counseled regarding the cost of each option and potential out of pocket expenses.  After consideration of all the options, they elected to have a detailed ultrasound.  A complete ultrasound was performed today. The ultrasound report will be documented separately. There were no visualized fetal anomalies or markers suggestive of aneuploidy. Amniocentesis and NIPS were declined today. They understand that screening tests cannot rule out all birth defects or genetic syndromes. The patient was advised of this limitation and states she still does not want additional testing at this time.   Ms Pellicane was provided with written information regarding cystic fibrosis (CF), spinal muscular atrophy (SMA) and hemoglobinopathies including the carrier frequency, availability of carrier screening and prenatal diagnosis if indicated.  In addition, we discussed that CF and hemoglobinopathies are routinely screened for as part of the [place] newborn screening panel.  After further discussion, she declined screening for CF, SMA and hemoglobinopathies.  Both family histories were reviewed and found to be noncontributory for birth defects, intellectual disability, and known genetic conditions. Without further information regarding the provided family history, an accurate genetic risk cannot be calculated. Further genetic counseling is warranted if more information is obtained.  Ms. Gee denied exposure to environmental toxins or chemical agents. She denied the use of alcohol, tobacco or street drugs. She denied significant viral illnesses during the course of her pregnancy.    I counseled this couple regarding the above risks and available options.  The approximate face-to-face time with  the genetic counselor was 32 minutes. Volney Presser, [employer] [facility] intern, provided >50% of the genetic counseling services today, under my  direct supervision.  Filbert Schilder, MS Certified Genetic Counselor

## 2016-03-23 ENCOUNTER — Encounter: Payer: Medicaid Other | Admitting: Certified Nurse Midwife

## 2016-04-03 ENCOUNTER — Ambulatory Visit (INDEPENDENT_AMBULATORY_CARE_PROVIDER_SITE_OTHER): Payer: Medicaid Other | Admitting: Certified Nurse Midwife

## 2016-04-03 VITALS — BP 114/71 | HR 87 | Temp 97.5°F | Wt 193.0 lb

## 2016-04-03 DIAGNOSIS — Z3492 Encounter for supervision of normal pregnancy, unspecified, second trimester: Secondary | ICD-10-CM

## 2016-04-03 LAB — POCT URINALYSIS DIPSTICK
Bilirubin, UA: NEGATIVE
Glucose, UA: NEGATIVE
Ketones, UA: NEGATIVE
Leukocytes, UA: NEGATIVE
NITRITE UA: NEGATIVE
PROTEIN UA: NEGATIVE
RBC UA: 250
SPEC GRAV UA: 1.015
UROBILINOGEN UA: NEGATIVE
pH, UA: 5

## 2016-04-03 NOTE — Progress Notes (Signed)
I agree with note by NP Student Jillyn Ledger.  Was present for exam.  R.Medardo Hassing CNM

## 2016-04-03 NOTE — Progress Notes (Signed)
Patient is experiencing a lot of fatigue. She wants to work for 4 more weeks and then stop. She is having a lot of gas pain which is causing stomach pain.

## 2016-04-03 NOTE — Progress Notes (Signed)
Subjective:    Michaela Moran is a 35 y.o. female being seen today for her obstetrical visit. She is at [redacted]w[redacted]d gestation. Patient reports: headache, heartburn, no bleeding, no cramping, no leaking and Braxton-Hicks contractions . Fetal movement: normal.  Problem List Items Addressed This Visit    None    Visit Diagnoses    Prenatal care, second trimester    -  Primary    Relevant Orders    POCT urinalysis dipstick      Patient Active Problem List   Diagnosis Date Noted  . [redacted] weeks gestation of pregnancy   . Advanced maternal age in multigravida   . S/P cesarean section 01/05/2013  . Previous cesarean delivery, antepartum condition or complication 123XX123  . Arrest of descent, delivered, current hospitalization 01/02/2013   Objective:    BP 114/71 mmHg  Pulse 87  Temp(Src) 97.5 F (36.4 C)  Wt 193 lb (87.544 kg)  LMP 09/27/2015 FHT: 130 BPM  Uterine Size: size equals dates     Assessment:    Pregnancy @ [redacted]w[redacted]d    Plan:    OBGCT: discussed and ordered for next visit. Signs and symptoms of preterm labor: discussed.  Labs, problem list reviewed and updated 2 hr GTT planned for Friday. Follow up in 2 weeks.

## 2016-04-13 ENCOUNTER — Other Ambulatory Visit: Payer: Medicaid Other

## 2016-04-13 ENCOUNTER — Inpatient Hospital Stay (HOSPITAL_COMMUNITY)
Admission: AD | Admit: 2016-04-13 | Discharge: 2016-04-14 | Disposition: A | Discharge status: Home / Self Care | Payer: Medicaid Other | Source: Ambulatory Visit | Attending: Obstetrics & Gynecology | Admitting: Obstetrics & Gynecology

## 2016-04-13 DIAGNOSIS — Z3A28 28 weeks gestation of pregnancy: Secondary | ICD-10-CM | POA: Insufficient documentation

## 2016-04-13 DIAGNOSIS — O2693 Pregnancy related conditions, unspecified, third trimester: Secondary | ICD-10-CM | POA: Insufficient documentation

## 2016-04-13 DIAGNOSIS — B3731 Acute candidiasis of vulva and vagina: Secondary | ICD-10-CM

## 2016-04-13 DIAGNOSIS — B373 Candidiasis of vulva and vagina: Secondary | ICD-10-CM

## 2016-04-13 DIAGNOSIS — R51 Headache: Secondary | ICD-10-CM | POA: Insufficient documentation

## 2016-04-13 DIAGNOSIS — O98813 Other maternal infectious and parasitic diseases complicating pregnancy, third trimester: Secondary | ICD-10-CM | POA: Diagnosis not present

## 2016-04-13 NOTE — MAU Note (Addendum)
Missed appt today at office for sugar check. I have headache. I have headache and not helping. Feet burn. Some white vag d/c with itching

## 2016-04-14 DIAGNOSIS — Z3A28 28 weeks gestation of pregnancy: Secondary | ICD-10-CM

## 2016-04-14 DIAGNOSIS — O98813 Other maternal infectious and parasitic diseases complicating pregnancy, third trimester: Secondary | ICD-10-CM

## 2016-04-14 DIAGNOSIS — B373 Candidiasis of vulva and vagina: Secondary | ICD-10-CM | POA: Diagnosis not present

## 2016-04-14 LAB — URINE MICROSCOPIC-ADD ON

## 2016-04-14 LAB — URINALYSIS, ROUTINE W REFLEX MICROSCOPIC
BILIRUBIN URINE: NEGATIVE
GLUCOSE, UA: NEGATIVE mg/dL
Ketones, ur: NEGATIVE mg/dL
Nitrite: NEGATIVE
PROTEIN: NEGATIVE mg/dL
Specific Gravity, Urine: 1.01 (ref 1.005–1.030)
pH: 6 (ref 5.0–8.0)

## 2016-04-14 LAB — WET PREP, GENITAL
Clue Cells Wet Prep HPF POC: NONE SEEN
Sperm: NONE SEEN
Trich, Wet Prep: NONE SEEN

## 2016-04-14 MED ORDER — TERCONAZOLE 0.4 % VA CREA: 1.0000 | TOPICAL_CREAM | Freq: Every day | VAGINAL | 0 refills | Status: DC

## 2016-04-14 MED ORDER — BUTALBITAL-APAP-CAFFEINE 50-325-40 MG PO TABS
2.0000 | ORAL_TABLET | Freq: Once | ORAL | Status: AC
  Administered 2016-04-14: 2 via ORAL
  Filled 2016-04-14: qty 2

## 2016-04-14 NOTE — Progress Notes (Signed)
Wet prep only collected. Spouse with pt

## 2016-04-14 NOTE — Discharge Instructions (Signed)

## 2016-04-14 NOTE — MAU Provider Note (Signed)
History     CSN: [id number]  Arrival date and time: 04/13/16 2327   None     Chief Complaint  Patient presents with  . Back Pain  . Foot Pain  . Headache   HPI Michaela Moran is a 35yo [id number] @ 28.4wks who presents for eval of multiple complaints including dysuria, feet 'burning', H/A. Denies fever, ctx, leaking or bldg. No visual disturbances or RUQ pain. She missed her prenatal visit today due to work and was supposed to have her glucola- would like that to be done tonight.  OB History    Gravida Para Term Preterm AB Living   3 2 2     2    SAB TAB Ectopic Multiple Live Births           2      Past Medical History:  Diagnosis Date  . Acid reflux   . Female circumcision   . Hemorrhoid   . 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 Hypercholesteremia     Past Surgical History:  Procedure Laterality Date  . CESAREAN SECTION    . CESAREAN SECTION N/A 01/02/2013   Procedure: CESAREAN SECTION;  Surgeon: Donnamae Jude, MD;  Location: [facility];  Service: Obstetrics;  Laterality: N/A;  Incidental Cyystotomy    Family History  Problem Relation Age of Onset  . Diabetes Mother   . Diabetes Sister   . Diabetes Brother   . Hypertension Brother   . Other Neg Hx   . Stroke Maternal Aunt     Social History  Substance Use Topics  . Smoking status: Never Smoker  . Smokeless tobacco: Never Used  . Alcohol use No    Allergies:  Allergies  Allergen Reactions  . Other Swelling    Allergic to peas.    Prescriptions Prior to Admission  Medication Sig Dispense Refill Last Dose  . metoCLOPramide (REGLAN) 10 MG tablet Take 1 tablet (10 mg total) by mouth every 8 (eight) hours as needed for nausea or vomiting. 30 tablet 2 Taking  . omeprazole (PRILOSEC) 20 MG capsule Take 1 capsule (20 mg total) by mouth 2 (two) times daily before a meal. 60 capsule 5 Taking  . Prenatal-Fe Fum-Methf-FA w/o A (VITAFOL-NANO) 18-0.6-0.4 MG TABS Take 1 tablet by mouth daily before breakfast. 30 tablet 5  Taking    ROS Physical Exam   Blood pressure 106/61, pulse 78, temperature 97.9 F (36.6 C), resp. rate 18, height 5\' 6"  (1.676 m), weight 88.7 kg (195 lb 9.6 oz), last menstrual period 09/27/2015, SpO2 99 %.  Physical Exam  Constitutional: She is oriented to person, place, and time. She appears well-developed.  HENT:  Head: Normocephalic.  Neck: Normal range of motion.  Cardiovascular: Normal rate.   Respiratory: Effort normal.  GI:  EFM 120s, +accels, no decels No ctx per toco  Musculoskeletal: Normal range of motion.  Neurological: She is alert and oriented to person, place, and time.  Skin: Skin is warm and dry.  Psychiatric: She has a normal mood and affect. Her behavior is normal. Thought content normal.   Urinalysis    Component Value Date/Time   COLORURINE YELLOW 04/13/2016 [place] 04/13/2016 2355   LABSPEC 1.010 04/13/2016 2355   PHURINE 6.0 04/13/2016 2355   GLUCOSEU NEGATIVE 04/13/2016 2355   HGBUR TRACE (A) 04/13/2016 2355   BILIRUBINUR NEGATIVE 04/13/2016 2355   BILIRUBINUR negative 04/03/2016 [place] 04/13/2016 2355   PROTEINUR NEGATIVE  04/13/2016 2355   UROBILINOGEN negative 04/03/2016 1022   UROBILINOGEN 0.2 05/12/2015 1145   NITRITE NEGATIVE 04/13/2016 2355   LEUKOCYTESUR SMALL (A) 04/13/2016 2355   Micro: 0-5 SE, few bacteria  Wet prep: +yeast, few WBC   MAU Course  Procedures  MDM UA/WP ordered Fioricet for H/A- beginning to be relieved at time of d/c   Assessment and Plan  IUP@28 .4wks VVC  D/C home Rx Terazol 7 Encouraged to call the OB office and reschedule visit/glucola  Serita Grammes CNM 04/14/2016, 12:15 AM

## 2016-04-14 NOTE — Progress Notes (Signed)
Ok to d/c efm per Lynnda Child CNM

## 2016-04-17 ENCOUNTER — Encounter: Payer: Self-pay | Admitting: *Deleted

## 2016-04-17 ENCOUNTER — Ambulatory Visit (INDEPENDENT_AMBULATORY_CARE_PROVIDER_SITE_OTHER): Payer: Medicaid Other | Admitting: Certified Nurse Midwife

## 2016-04-17 VITALS — BP 89/66 | HR 91 | Temp 97.9°F | Wt 191.1 lb

## 2016-04-17 DIAGNOSIS — O09523 Supervision of elderly multigravida, third trimester: Secondary | ICD-10-CM

## 2016-04-17 LAB — POCT URINALYSIS DIPSTICK
BILIRUBIN UA: NEGATIVE
Glucose, UA: NEGATIVE
KETONES UA: NEGATIVE
LEUKOCYTES UA: NEGATIVE
NITRITE UA: NEGATIVE
Protein, UA: NEGATIVE
Spec Grav, UA: 1.02
Urobilinogen, UA: 0.2
pH, UA: 5

## 2016-04-17 NOTE — Progress Notes (Signed)
Subjective:    Michaela Moran is a 35 y.o. female being seen today for her obstetrical visit. She is at [redacted]w[redacted]d gestation. Patient reports no bleeding, no contractions, no cramping, no leaking and nerve pain in feet with walking, is working currently. Fetal movement: normal.  Problem List Items Addressed This Visit      Other   Advanced maternal age in multigravida - Primary     Clinic  [facility] Prenatal Labs  Dating  Early [place] Blood type: AB/Positive/-- (03/20 1056)   Genetic Screen 1 Screen:    AFP: declined @ MFM   Quad:     NIPS: Antibody:Negative (03/20 1056)  Anatomic [place]  Normal MFM Rubella: 12.60 (03/20 1056)  GTT Early:               Third trimester:  RPR: Non Reactive (03/20 1056)   Flu vaccine  HBsAg: Negative (03/20 1056)   TDaP vaccine                                               Rhogam: N/A HIV: Non Reactive (03/20 1056)   Baby Food            Breast                                   GBS: (For PCN allergy, check sensitivities)  Contraception  Nexplanon Pap: Needs!!  Circumcision  [facility]  Muslim: prefers female Nutritional therapist [facility] [place]   Support Person  Spouse          Relevant Orders   [place] MFM OB FOLLOW UP    Other Visit Diagnoses   None.    Patient Active Problem List   Diagnosis Date Noted  . [redacted] weeks gestation of pregnancy   . Advanced maternal age in multigravida   . S/P cesarean section 01/05/2013  . Previous cesarean delivery, antepartum condition or complication [id number]  . Arrest of descent, delivered, current hospitalization 01/02/2013   Objective:    BP (!) 89/66   Pulse 91   Temp 97.9 F (36.6 C)   Wt 191 lb 1.6 oz (86.7 kg)   LMP 09/27/2015   BMI 30.84 kg/m  FHT:  150 BPM  Uterine Size: size equals dates  Presentation: cephalic     Assessment:    Pregnancy @ [redacted]w[redacted]d weeks   Repeat C-section   Plan:    Needs 2 hour OGTT, scheduled for Wednesday 04/18/16.    Muslim: prefers female providers   labs reviewed, problem  list updated Consent signed. GBS planning TDAP offered  Rhogam given for RH negative Pediatrician: discussed. Infant feeding: plans to breastfeed. Maternity leave: discussed, forms done. Cigarette smoking: never smoked. Orders Placed This Encounter  Procedures  . [place] MFM OB FOLLOW UP    Standing Status:   Future    Standing Expiration Date:   06/17/2017    Order Specific Question:   Reason for Exam (SYMPTOM  OR DIAGNOSIS REQUIRED)    Answer:   f/u growth d/t AMA    Order Specific Question:   Preferred imaging location?    Answer:   [facility]   No orders of the defined types were placed in this encounter.  Follow up in 2 Weeks with Faculty practice: repeat C-section.

## 2016-04-17 NOTE — Assessment & Plan Note (Addendum)
  Clinic  [facility] Prenatal Labs  Dating  Early [place] Blood type: AB/Positive/-- (03/20 1056)   Genetic Screen 1 Screen:    AFP: declined @ MFM   Quad:     NIPS: Antibody:Negative (03/20 1056)  Anatomic [place]  Normal MFM Rubella: 12.60 (03/20 1056)  GTT Early:               Third trimester:  RPR: Non Reactive (03/20 1056)   Flu vaccine  HBsAg: Negative (03/20 1056)   TDaP vaccine                                               Rhogam: N/A HIV: Non Reactive (03/20 1056)   Baby Food            Breast                                   GBS: (For PCN allergy, check sensitivities)  Contraception  Nexplanon Pap: Needs!!  Circumcision  [facility]  Muslim: prefers female Nutritional therapist [facility] [place]   Support Person  Spouse

## 2016-04-17 NOTE — Addendum Note (Signed)
Addended by: Dorothyann Gibbs on: 04/17/2016 02:22 PM   Modules accepted: Orders

## 2016-04-17 NOTE — Progress Notes (Signed)
Pt c/o foot pain and being tired

## 2016-04-18 ENCOUNTER — Other Ambulatory Visit: Payer: Medicaid Other

## 2016-04-28 ENCOUNTER — Encounter (HOSPITAL_COMMUNITY): Payer: Self-pay

## 2016-04-28 ENCOUNTER — Inpatient Hospital Stay (HOSPITAL_COMMUNITY)
Admission: AD | Admit: 2016-04-28 | Discharge: 2016-04-28 | Disposition: A | Discharge status: Home / Self Care | Payer: Medicaid Other | Source: Ambulatory Visit | Attending: Obstetrics & Gynecology | Admitting: Obstetrics & Gynecology

## 2016-04-28 ENCOUNTER — Inpatient Hospital Stay (HOSPITAL_COMMUNITY): Payer: Medicaid Other

## 2016-04-28 DIAGNOSIS — Z9889 Other specified postprocedural states: Secondary | ICD-10-CM | POA: Diagnosis not present

## 2016-04-28 DIAGNOSIS — K5900 Constipation, unspecified: Secondary | ICD-10-CM | POA: Insufficient documentation

## 2016-04-28 DIAGNOSIS — Z603 Acculturation difficulty: Secondary | ICD-10-CM

## 2016-04-28 DIAGNOSIS — N949 Unspecified condition associated with female genital organs and menstrual cycle: Secondary | ICD-10-CM | POA: Diagnosis not present

## 2016-04-28 DIAGNOSIS — N90811 Female genital mutilation Type I status: Secondary | ICD-10-CM

## 2016-04-28 DIAGNOSIS — Z888 Allergy status to other drugs, medicaments and biological substances status: Secondary | ICD-10-CM | POA: Diagnosis not present

## 2016-04-28 DIAGNOSIS — K219 Gastro-esophageal reflux disease without esophagitis: Secondary | ICD-10-CM | POA: Diagnosis not present

## 2016-04-28 DIAGNOSIS — Z79899 Other long term (current) drug therapy: Secondary | ICD-10-CM | POA: Insufficient documentation

## 2016-04-28 DIAGNOSIS — Z3A30 30 weeks gestation of pregnancy: Secondary | ICD-10-CM | POA: Insufficient documentation

## 2016-04-28 DIAGNOSIS — O34219 Maternal care for unspecified type scar from previous cesarean delivery: Secondary | ICD-10-CM

## 2016-04-28 DIAGNOSIS — O26899 Other specified pregnancy related conditions, unspecified trimester: Secondary | ICD-10-CM

## 2016-04-28 DIAGNOSIS — R102 Pelvic and perineal pain: Secondary | ICD-10-CM | POA: Diagnosis not present

## 2016-04-28 DIAGNOSIS — O26893 Other specified pregnancy related conditions, third trimester: Secondary | ICD-10-CM | POA: Diagnosis not present

## 2016-04-28 HISTORY — DX: Female genital mutilation type I status: N90.811

## 2016-04-28 HISTORY — DX: Acculturation difficulty: Z60.3

## 2016-04-28 LAB — URINALYSIS, ROUTINE W REFLEX MICROSCOPIC
Bilirubin Urine: NEGATIVE
GLUCOSE, UA: NEGATIVE mg/dL
NITRITE: NEGATIVE
PROTEIN: NEGATIVE mg/dL
Specific Gravity, Urine: 1.025 (ref 1.005–1.030)
pH: 5.5 (ref 5.0–8.0)

## 2016-04-28 LAB — FETAL FIBRONECTIN: FETAL FIBRONECTIN: NEGATIVE

## 2016-04-28 LAB — URINE MICROSCOPIC-ADD ON

## 2016-04-28 MED ORDER — PRENATAL VITAMINS 0.8 MG PO TABS: 1.0000 | ORAL_TABLET | Freq: Every day | ORAL | 12 refills | Status: DC

## 2016-04-28 MED ORDER — LACTATED RINGERS IV BOLUS (SEPSIS)
1000.0000 mL | Freq: Once | INTRAVENOUS | Status: AC
  Administered 2016-04-28: 1000 mL via INTRAVENOUS

## 2016-04-28 MED ORDER — POLYETHYLENE GLYCOL 3350 17 G PO PACK: 17.0000 g | PACK | Freq: Every day | ORAL | 0 refills | Status: DC

## 2016-04-28 NOTE — MAU Note (Addendum)
Having abd pain since 0300 this am. Comes every 5 mins and baby pushing down. Denies LOF or bleeding. PRior C/S and no hx PTD. Baby not moving as much last night like normal today

## 2016-04-28 NOTE — MAU Provider Note (Signed)
Chief Complaint:  Abdominal Pain   First Provider Initiated Contact with Patient 04/28/16 2102     HPI  Michaela Moran is a 35 y.o. G3P2002 at 35w4dwho presents to maternity admissions reporting having abdominal pains every 5 minutes with pressure. Feels baby pushing down. Worried her C/S scar is opening up.  Was told by her boss she needed to go to the hospital to get a work note. She reports good fetal movement, denies LOF, vaginal bleeding, vaginal itching/burning, urinary symptoms, h/a, dizziness, n/v, diarrhea, constipation or fever/chills.  She denies headache, visual changes or RUQ abdominal pain.  Denies constipation but states it is hard to have a bowel movement, and she only has one every few days  Also wants Rx for Prenatal vitamin. States midwife will not give it to her, and "they won't let me see Dr Jodi Mourning to get one.   RN Note: Having abd pain since 0300 this am. Comes every 5 mins and baby pushing down. Denies LOF or bleeding. PRior C/S and no hx PTD. Baby not moving as much last night like normal today  Past Medical History: Past Medical History:  Diagnosis Date  . Acid reflux   . Female circumcision   . Hemorrhoid   . 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 Hypercholesteremia     Past obstetric history: OB History  Gravida Para Term Preterm AB Living  3 2 2     2   SAB TAB Ectopic Multiple Live Births          2    # Outcome Date GA Lbr Len/2nd Weight Sex Delivery Anes PTL Lv  3 Current           2 Term 01/02/13 [redacted]w[redacted]d 14:31 / 13:07 9 lb 0.5 oz (4.095 kg) M CS-LVertical EPI  LIV  1 Term 06/10/10 [redacted]w[redacted]d  6 lb 14 oz (3.118 kg) F CS-Unspec EPI N LIV     Birth Comments: unknown reason for c/section      Past Surgical History: Past Surgical History:  Procedure Laterality Date  . CESAREAN SECTION    . CESAREAN SECTION N/A 01/02/2013   Procedure: CESAREAN SECTION;  Surgeon: Donnamae Jude, MD;  Location: [facility];  Service: Obstetrics;  Laterality: N/A;  Incidental Cyystotomy     Family History: Family History  Problem Relation Age of Onset  . Diabetes Mother   . Diabetes Sister   . Diabetes Brother   . Hypertension Brother   . Stroke Maternal Aunt   . Other Neg Hx     Social History: Social History  Substance Use Topics  . Smoking status: Never Smoker  . Smokeless tobacco: Never Used  . Alcohol use No    Allergies:  Allergies  Allergen Reactions  . Other Swelling    Allergic to peas.    Meds:  Prescriptions Prior to Admission  Medication Sig Dispense Refill Last Dose  . metoCLOPramide (REGLAN) 10 MG tablet Take 1 tablet (10 mg total) by mouth every 8 (eight) hours as needed for nausea or vomiting. 30 tablet 2 04/13/2016 at Unknown time  . omeprazole (PRILOSEC) 20 MG capsule Take 1 capsule (20 mg total) by mouth 2 (two) times daily before a meal. 60 capsule 5 04/13/2016 at Unknown time  . Prenatal-Fe Fum-Methf-FA w/o A (VITAFOL-NANO) 18-0.6-0.4 MG TABS Take 1 tablet by mouth daily before breakfast. 30 tablet 5 Taking  . terconazole (TERAZOL 7) 0.4 % vaginal cream Place 1 applicator vaginally at bedtime. [place]  g 0     I have reviewed patient's Past Medical Hx, Surgical Hx, Family Hx, Social Hx, medications and allergies.   ROS:  Review of Systems  Constitutional: Negative for chills and fever.  Respiratory: Negative for shortness of breath.   Gastrointestinal: Positive for abdominal pain (not really pain, more like pressure) and constipation. Negative for diarrhea, nausea and vomiting.  Genitourinary: Positive for pelvic pain. Negative for dysuria, vaginal bleeding and vaginal discharge.  Neurological: Negative for dizziness.   Other systems negative  Physical Exam  Patient Vitals for the past 24 hrs:  BP Temp Pulse Resp Height Weight  04/28/16 1919 123/70 98.8 F (37.1 C) 98 18 5\' 6"  (1.676 m) 197 lb 6.4 oz (89.5 kg)   Constitutional: Well-developed, well-nourished female in no acute distress.  Cardiovascular: normal rate and  rhythm Respiratory: normal effort, clear to auscultation bilaterally GI: Abd soft, non-tender, gravid appropriate for gestational age.   No rebound or guarding. MS: Extremities nontender, no edema, normal ROM Neurologic: Alert and oriented x 4.  GU: Neg CVAT.  PELVIC EXAM: Cervix pink, visually closed, without lesion, scant white creamy discharge, vaginal walls and external genitalia normal Bimanual exam: Cervix firm, posterior, neg CMT, uterus nontender, Fundal Height consistent with dates, adnexa without tenderness, enlargement, or mass Cervix is very ill defined, soft, difficult to feel.  ? effaced  Dilation: Closed Effacement (%): 50 Exam by:: Hansel Feinstein CNM  FHT:  Baseline 140 , moderate variability, accelerations present, no decelerations Contractions:  Irregular     Labs: Results for orders placed or performed during the hospital encounter of 04/28/16 (from the past 24 hour(s))  Urinalysis, Routine w reflex microscopic (not at [facility])     Status: Abnormal   Collection Time: 04/28/16  7:25 PM  Result Value Ref Range   Color, Urine YELLOW YELLOW   APPearance CLEAR CLEAR   Specific Gravity, Urine 1.025 1.005 - 1.030   pH 5.5 5.0 - 8.0   Glucose, UA NEGATIVE NEGATIVE mg/dL   Hgb urine dipstick TRACE (A) NEGATIVE   Bilirubin Urine NEGATIVE NEGATIVE   Ketones, ur >80 (A) NEGATIVE mg/dL   Protein, ur NEGATIVE NEGATIVE mg/dL   Nitrite NEGATIVE NEGATIVE   Leukocytes, UA TRACE (A) NEGATIVE  Urine microscopic-add on     Status: Abnormal   Collection Time: 04/28/16  7:25 PM  Result Value Ref Range   Squamous Epithelial / LPF 6-30 (A) NONE SEEN   WBC, UA 0-5 0 - 5 WBC/hpf   RBC / HPF 0-5 0 - 5 RBC/hpf   Bacteria, UA FEW (A) NONE SEEN   Casts HYALINE CASTS (A) NEGATIVE   Urine-Other MUCOUS PRESENT   Fetal fibronectin     Status: None   Collection Time: 04/28/16  8:56 PM  Result Value Ref Range   Fetal Fibronectin NEGATIVE NEGATIVE   AB/Positive/-- (03/20 1056)  Imaging:   [place] ordered for Cx length which was 3.9cm AFI normal  MAU Course/MDM: I have ordered labs and reviewed results.  NST reviewed Reassured. While preparing to discharge her, she requests note for work today.   Assessment: 1. Preterm labor   2. Pelvic pain affecting pregnancy   3.  No evidence of preterm labor 4.   Constipation  Plan: Discharge home Preterm Labor precautions and fetal kick counts Follow up in Office for prenatal visits and recheck of status   Pt stable at time of discharge.  Hansel Feinstein CNM, MSN Certified Nurse-Midwife 04/28/2016 9:49 PM

## 2016-04-28 NOTE — Discharge Instructions (Signed)

## 2016-05-01 ENCOUNTER — Encounter: Payer: Medicaid Other | Admitting: Obstetrics and Gynecology

## 2016-05-02 ENCOUNTER — Encounter (HOSPITAL_COMMUNITY): Payer: Self-pay | Admitting: *Deleted

## 2016-05-02 ENCOUNTER — Ambulatory Visit (INDEPENDENT_AMBULATORY_CARE_PROVIDER_SITE_OTHER): Payer: Medicaid Other | Admitting: Obstetrics and Gynecology

## 2016-05-02 ENCOUNTER — Other Ambulatory Visit: Payer: Medicaid Other

## 2016-05-02 VITALS — BP 100/66 | HR 91 | Temp 98.2°F | Wt 196.2 lb

## 2016-05-02 DIAGNOSIS — Z603 Acculturation difficulty: Secondary | ICD-10-CM

## 2016-05-02 DIAGNOSIS — O34219 Maternal care for unspecified type scar from previous cesarean delivery: Secondary | ICD-10-CM

## 2016-05-02 DIAGNOSIS — O09523 Supervision of elderly multigravida, third trimester: Secondary | ICD-10-CM

## 2016-05-02 DIAGNOSIS — Z3493 Encounter for supervision of normal pregnancy, unspecified, third trimester: Secondary | ICD-10-CM

## 2016-05-02 DIAGNOSIS — N90811 Female genital mutilation Type I status: Secondary | ICD-10-CM

## 2016-05-02 LAB — POCT URINALYSIS DIPSTICK
Bilirubin, UA: NEGATIVE
Blood, UA: 50
GLUCOSE UA: NEGATIVE
Ketones, UA: NEGATIVE
NITRITE UA: NEGATIVE
Spec Grav, UA: 1.015
UROBILINOGEN UA: 0.2
pH, UA: 6

## 2016-05-02 MED ORDER — VITAMIN D (CHOLECALCIFEROL) 25 MCG (1000 UT) PO CAPS: 1.0000 | ORAL_CAPSULE | Freq: Every day | ORAL | 3 refills | Status: DC

## 2016-05-02 MED ORDER — FLUCONAZOLE 150 MG PO TABS: 150.0000 mg | ORAL_TABLET | Freq: Once | ORAL | 0 refills | Status: AC

## 2016-05-02 NOTE — Progress Notes (Signed)
Subjective:  Michaela Moran is a 35 y.o. G3P2002 at [redacted]w[redacted]d being seen today for ongoing prenatal care.  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  Patient reports no complaints.  Contractions: Not present. Vag. Bleeding: None.  Movement: Present. Denies leaking of fluid.   The following portions of the patient's history were reviewed and updated as appropriate: allergies, current medications, past family history, past medical history, past social history, past surgical history and problem list. Problem list updated.  Objective:   Vitals:   05/02/16 0809  BP: 100/66  Pulse: 91  Temp: 98.2 F (36.8 C)  Weight: 196 lb 3.2 oz (89 kg)    Fetal Status: Fetal Heart Rate (bpm): 142 Fundal Height: 31 cm Movement: Present     General:  Alert, oriented and cooperative. Patient is in no acute distress.  Skin: Skin is warm and dry. No rash noted.   Cardiovascular: Normal heart rate noted  Respiratory: Normal respiratory effort, no problems with respiration noted  Abdomen: Soft, gravid, appropriate for gestational age. Pain/Pressure: Present     Pelvic:  Cervical exam deferred        Extremities: Normal range of motion.  Edema: None  Mental Status: Normal mood and affect. Normal behavior. Normal judgment and thought content.   Urinalysis: Urine Protein: Trace Urine Glucose: Negative  Assessment and Plan:  Pregnancy: G3P2002 at [redacted]w[redacted]d  1. Prenatal care, third trimester - POCT Urinalysis Dipstick  2. Previous cesarean delivery, antepartum condition or complication Will be scheduled for repeat at 39 weeks Third trimester labs today - Glucose Tolerance, 2 Hours w/1 Hour  3. Language barrier, cultural differences   4. Advanced maternal age in  multigravida, third trimester Declined screening  5. 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 Rx diflucan provided  Preterm labor symptoms and general obstetric precautions including but not limited to vaginal bleeding, contractions, leaking of fluid and fetal movement were reviewed in detail with the patient. Please refer to After Visit Summary for other counseling recommendations.  Return in about 2 weeks (around 05/16/2016) for [name].   Mora Bellman, MD

## 2016-05-02 NOTE — Progress Notes (Signed)
Pt c/o vaginal itching/increased thick white discharge, increased urge to urinate, and pain with urination. Pt requesting to do 2 hour GTT today.

## 2016-05-02 NOTE — Addendum Note (Signed)
Addended by: Mora Bellman on: 05/02/2016 09:04 AM   Modules accepted: Orders

## 2016-05-03 LAB — HIV ANTIBODY (ROUTINE TESTING W REFLEX): HIV SCREEN 4TH GENERATION: NONREACTIVE

## 2016-05-03 LAB — CBC
Hematocrit: 33.7 % — ABNORMAL LOW (ref 34.0–46.6)
Hemoglobin: 11.2 g/dL (ref 11.1–15.9)
MCH: 30.5 pg (ref 26.6–33.0)
MCHC: 33.2 g/dL (ref 31.5–35.7)
MCV: 92 fL (ref 79–97)
Platelets: 297 10*3/uL (ref 150–379)
RBC: 3.67 x10E6/uL — AB (ref 3.77–5.28)
RDW: 14.1 % (ref 12.3–15.4)
WBC: 6.7 10*3/uL (ref 3.4–10.8)

## 2016-05-03 LAB — RPR: RPR: NONREACTIVE

## 2016-05-03 LAB — GLUCOSE TOLERANCE, 2 HOURS W/ 1HR
GLUCOSE, 2 HOUR: 99 mg/dL (ref 65–152)
Glucose, 1 hour: 114 mg/dL (ref 65–179)
Glucose, Fasting: 81 mg/dL (ref 65–91)

## 2016-05-04 LAB — CULTURE, OB URINE

## 2016-05-04 LAB — URINE CULTURE, OB REFLEX

## 2016-05-17 ENCOUNTER — Encounter: Payer: Medicaid Other | Admitting: Obstetrics & Gynecology

## 2016-05-18 ENCOUNTER — Ambulatory Visit (HOSPITAL_COMMUNITY): Payer: 59

## 2016-05-24 ENCOUNTER — Encounter: Payer: Medicaid Other | Admitting: Advanced Practice Midwife

## 2016-05-25 ENCOUNTER — Other Ambulatory Visit: Payer: Self-pay | Admitting: Certified Nurse Midwife

## 2016-05-25 ENCOUNTER — Ambulatory Visit (HOSPITAL_COMMUNITY)
Admission: RE | Admit: 2016-05-25 | Discharge: 2016-05-25 | Disposition: A | Discharge status: Home / Self Care | Payer: Medicaid Other | Source: Ambulatory Visit | Attending: Certified Nurse Midwife | Admitting: Certified Nurse Midwife

## 2016-05-25 ENCOUNTER — Encounter (HOSPITAL_COMMUNITY): Payer: Self-pay

## 2016-05-25 DIAGNOSIS — Z36 Encounter for antenatal screening of mother: Secondary | ICD-10-CM | POA: Diagnosis not present

## 2016-05-25 DIAGNOSIS — Z0489 Encounter for examination and observation for other specified reasons: Secondary | ICD-10-CM

## 2016-05-25 DIAGNOSIS — O09523 Supervision of elderly multigravida, third trimester: Secondary | ICD-10-CM | POA: Diagnosis not present

## 2016-05-25 DIAGNOSIS — IMO0002 Reserved for concepts with insufficient information to code with codable children: Secondary | ICD-10-CM

## 2016-05-25 DIAGNOSIS — Z3A34 34 weeks gestation of pregnancy: Secondary | ICD-10-CM

## 2016-05-28 ENCOUNTER — Other Ambulatory Visit: Payer: Self-pay | Admitting: Certified Nurse Midwife

## 2016-06-05 ENCOUNTER — Other Ambulatory Visit: Payer: Self-pay | Admitting: Obstetrics

## 2016-06-07 ENCOUNTER — Ambulatory Visit (INDEPENDENT_AMBULATORY_CARE_PROVIDER_SITE_OTHER): Payer: Medicaid Other | Admitting: Obstetrics and Gynecology

## 2016-06-07 ENCOUNTER — Encounter: Payer: Self-pay | Admitting: *Deleted

## 2016-06-07 VITALS — BP 91/63 | HR 90 | Temp 98.1°F | Wt 193.1 lb

## 2016-06-07 DIAGNOSIS — O09523 Supervision of elderly multigravida, third trimester: Secondary | ICD-10-CM

## 2016-06-07 DIAGNOSIS — Z603 Acculturation difficulty: Secondary | ICD-10-CM

## 2016-06-07 DIAGNOSIS — O34219 Maternal care for unspecified type scar from previous cesarean delivery: Secondary | ICD-10-CM

## 2016-06-07 MED ORDER — METOCLOPRAMIDE HCL 10 MG PO TABS: ORAL_TABLET | ORAL | 0 refills | Status: DC

## 2016-06-07 NOTE — Addendum Note (Signed)
Addended by: Valli Glance F on: 06/07/2016 01:25 PM   Modules accepted: Orders

## 2016-06-07 NOTE — Progress Notes (Signed)
   PRENATAL VISIT NOTE  Subjective:  Michaela Moran is a 35 y.o. G3P2002 at [redacted]w[redacted]d being seen today for ongoing prenatal care.  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  Patient reports no complaints.  Contractions: Irregular. Vag. Bleeding: None.  Movement: Present. Denies leaking of fluid.   The following portions of the patient's history were reviewed and updated as appropriate: allergies, current medications, past family history, past medical history, past social history, past surgical history and problem list. Problem list updated.  Objective:   Vitals:   06/07/16 1053  BP: 91/63  Pulse: 90  Temp: 98.1 F (36.7 C)  Weight: 193 lb 1.6 oz (87.6 kg)    Fetal Status: Fetal Heart Rate (bpm): 144 Fundal Height: 36 cm Movement: Present  Presentation: Vertex  General:  Alert, oriented and cooperative. Patient is in no acute distress.  Skin: Skin is warm and dry. No rash noted.   Cardiovascular: Normal heart rate noted  Respiratory: Normal respiratory effort, no problems with respiration noted  Abdomen: Soft, gravid, appropriate for gestational age. Pain/Pressure: Present     Pelvic:  Cervical exam performed Dilation: Closed Effacement (%): Thick Station: Ballotable  Extremities: Normal range of motion.  Edema: None  Mental Status: Normal mood and affect. Normal behavior. Normal judgment and thought content.   Urinalysis:      Assessment and Plan:  Pregnancy: G3P2002 at [redacted]w[redacted]d  1. Previous cesarean delivery, antepartum condition or complication Patient schedule for repeat on 10/10 with Dr. Ilda Basset. She desires to meet with surgeon. Explained to the patient that we will try to coordinate that next week or in 2 weeks  Cultures  today Patient desires work note to stop working on 9/25 and return on 11/6. This was provided - Culture, beta strep (group b only) - GC/Chlamydia Probe Amp - Culture, OB Urine  2. Language barrier, cultural differences   3. Advanced maternal age in multigravida, third trimester   Preterm labor symptoms and general obstetric precautions including but not limited to vaginal bleeding, contractions, leaking of fluid and fetal movement were reviewed in detail with the patient. Please refer to After Visit Summary for other counseling recommendations.  Return in about 1 week (around 06/14/2016) for ROB.  Mora Bellman, MD

## 2016-06-07 NOTE — Addendum Note (Signed)
Addended by: Valli Glance F on: 06/07/2016 01:24 PM   Modules accepted: Orders

## 2016-06-07 NOTE — Progress Notes (Signed)
Patient states that she started feeling irregular contractions yesterday, no vaginal bleeding. Patient stated that she has had cold symptoms since last night that consist of congestion and nasal drainage.

## 2016-06-09 ENCOUNTER — Encounter: Payer: Self-pay | Admitting: Obstetrics and Gynecology

## 2016-06-09 DIAGNOSIS — N736 Female pelvic peritoneal adhesions (postinfective): Secondary | ICD-10-CM

## 2016-06-09 HISTORY — DX: Female pelvic peritoneal adhesions (postinfective): N73.6

## 2016-06-11 LAB — CULTURE, BETA STREP (GROUP B ONLY): STREP GP B CULTURE: NEGATIVE

## 2016-06-15 ENCOUNTER — Ambulatory Visit (INDEPENDENT_AMBULATORY_CARE_PROVIDER_SITE_OTHER): Payer: Medicaid Other | Admitting: Obstetrics and Gynecology

## 2016-06-15 VITALS — BP 102/71 | HR 85 | Wt 193.0 lb

## 2016-06-15 DIAGNOSIS — N736 Female pelvic peritoneal adhesions (postinfective): Secondary | ICD-10-CM

## 2016-06-15 DIAGNOSIS — Z603 Acculturation difficulty: Secondary | ICD-10-CM

## 2016-06-15 DIAGNOSIS — O34219 Maternal care for unspecified type scar from previous cesarean delivery: Secondary | ICD-10-CM

## 2016-06-15 NOTE — Progress Notes (Signed)
Prenatal Visit Note Date: 06/15/2016 Clinic: [facility] ([facility] Patient)  Subjective:  Michaela Moran is a 35 y.o. G3P2002 at [redacted]w[redacted]d being seen today for ongoing prenatal care.  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  Patient reports no complaints.   Contractions: Irregular. Vag. Bleeding: None.  Movement: Present. Denies leaking of fluid.   The following portions of the patient's history were reviewed and updated as appropriate: allergies, current medications, past family history, past medical history, past social history, past surgical history and problem list. Problem list updated.  Objective:   Vitals:   06/15/16 0957  BP: 102/71  Pulse: 85  Weight: 193 lb (87.5 kg)    Fetal Status: Fetal Heart Rate (bpm): 130 Fundal Height: 37 cm Movement: Present  Presentation: Vertex  General:  Alert, oriented and cooperative. Patient is in no acute distress.  Skin: Skin is warm and dry. No rash noted.   Cardiovascular: Normal heart rate noted  Respiratory: Normal respiratory effort, no problems with respiration noted  Abdomen: Soft, gravid, appropriate for gestational age. Well healed low transverse incision.   Pelvic:  Cervical exam deferred        Extremities: Normal range of motion.  Edema: None  Mental Status: Normal mood and affect. Normal behavior. Normal judgment and thought content.   Urinalysis: Urine Protein: Negative Urine Glucose: Negative  Assessment and Plan:  Pregnancy: G3P2002 at [redacted]w[redacted]d  1. Previous cesarean delivery, antepartum condition or complication Set up for [id number] rpt c-section. Declines BTL  2. Language barrier, cultural differences Husband interprets; form signed    3. Pelvic adhesive disease Prior op note reviewed. D/w her that would recommend vertical midline to decrease risk of encountering pelvic adhesive disease, which they are amenable to.   Term labor symptoms and general obstetric precautions including but not limited to vaginal bleeding, contractions, leaking of fluid and fetal movement were reviewed in detail with the patient. Please refer to After Visit Summary for other counseling recommendations.  Return in about 1 week (around 06/22/2016).   Aletha Halim, MD

## 2016-06-20 ENCOUNTER — Ambulatory Visit (INDEPENDENT_AMBULATORY_CARE_PROVIDER_SITE_OTHER): Payer: Medicaid Other | Admitting: Obstetrics & Gynecology

## 2016-06-20 ENCOUNTER — Telehealth (HOSPITAL_COMMUNITY): Payer: Self-pay | Admitting: *Deleted

## 2016-06-20 VITALS — BP 110/76 | HR 96 | Temp 98.2°F | Wt 194.2 lb

## 2016-06-20 DIAGNOSIS — Z603 Acculturation difficulty: Secondary | ICD-10-CM

## 2016-06-20 DIAGNOSIS — O34211 Maternal care for low transverse scar from previous cesarean delivery: Secondary | ICD-10-CM

## 2016-06-20 DIAGNOSIS — O34219 Maternal care for unspecified type scar from previous cesarean delivery: Secondary | ICD-10-CM

## 2016-06-20 DIAGNOSIS — Z3481 Encounter for supervision of other normal pregnancy, first trimester: Secondary | ICD-10-CM

## 2016-06-20 DIAGNOSIS — O09523 Supervision of elderly multigravida, third trimester: Secondary | ICD-10-CM

## 2016-06-20 NOTE — Patient Instructions (Signed)
Cesarean Delivery, Care After  Refer to this sheet in the next few weeks. These instructions provide you with information on caring for yourself after your procedure. Your health care provider may also give you specific instructions. Your treatment has been planned according to current medical practices, but problems sometimes occur. Call your health care provider if you have any problems or questions after you go home.  HOME CARE INSTRUCTIONS   Only take over-the-counter or prescription medications as directed by your health care provider.   Do not drink alcohol, especially if you are breastfeeding or taking medication to relieve pain.   Do not chew or smoke tobacco.   Continue to use good perineal care. Good perineal care includes:    Wiping your perineum from front to back.    Keeping your perineum clean.   Check your surgical cut (incision) daily for increased redness, drainage, swelling, or separation of skin.   Clean your incision gently with soap and water every day, and then pat it dry. If your health care provider says it is okay, leave the incision uncovered. Use a bandage (dressing) if the incision is draining fluid or appears irritated. If the adhesive strips across the incision do not fall off within 7 days, carefully peel them off.   Hug a pillow when coughing or sneezing until your incision is healed. This helps to relieve pain.   Do not use tampons or douche until your health care provider says it is okay.   Shower, wash your hair, and take tub baths as directed by your health care provider.   Wear a well-fitting bra that provides breast support.   Limit wearing support panties or control-top hose.   Drink enough fluids to keep your urine clear or pale yellow.   Eat high-fiber foods such as whole grain cereals and breads, brown rice, beans, and fresh fruits and vegetables every day. These foods may help prevent or relieve constipation.   Resume activities such as climbing stairs,  driving, lifting, exercising, or traveling as directed by your health care provider.   Talk to your health care provider about resuming sexual activities. This is dependent upon your risk of infection, your rate of healing, and your comfort and desire to resume sexual activity.   Try to have someone help you with your household activities and your newborn for at least a few days after you leave the hospital.   Rest as much as possible. Try to rest or take a nap when your newborn is sleeping.   Increase your activities gradually.   Keep all of your scheduled postpartum appointments. It is very important to keep your scheduled follow-up appointments. At these appointments, your health care provider will be checking to make sure that you are healing physically and emotionally.  SEEK MEDICAL CARE IF:    You are passing large clots from your vagina. Save any clots to show your health care provider.   You have a foul smelling discharge from your vagina.   You have trouble urinating.   You are urinating frequently.   You have pain when you urinate.   You have a change in your bowel movements.   You have increasing redness, pain, or swelling near your incision.   You have pus draining from your incision.   Your incision is separating.   You have painful, hard, or reddened breasts.   You have a severe headache.   You have blurred vision or see spots.   You feel sad   or depressed.   You have thoughts of hurting yourself or your newborn.   You have questions about your care, the care of your newborn, or medications.   You are dizzy or light-headed.   You have a rash.   You have pain, redness, or swelling at the site of the removed intravenous access (IV) tube.   You have nausea or vomiting.   You stopped breastfeeding and have not had a menstrual period within 12 weeks of stopping.   You are not breastfeeding and have not had a menstrual period within 12 weeks of delivery.   You have a fever.  SEEK  IMMEDIATE MEDICAL CARE IF:   You have persistent pain.   You have chest pain.   You have shortness of breath.   You faint.   You have leg pain.   You have stomach pain.   Your vaginal bleeding saturates 2 or more sanitary pads in 1 hour.  MAKE SURE YOU:    Understand these instructions.   Will watch your condition.   Will get help right away if you are not doing well or get worse.     This information is not intended to replace advice given to you by your health care provider. Make sure you discuss any questions you have with your health care provider.     Document Released: 05/26/2002 Document Revised: 09/24/2014 Document Reviewed: 04/30/2012  Elsevier Interactive Patient Education 2016 Elsevier Inc.

## 2016-06-20 NOTE — Progress Notes (Signed)
   PRENATAL VISIT NOTE  Subjective:  Michaela Moran is a 35 y.o. G3P2002 at [redacted]w[redacted]d being seen today for ongoing prenatal care.  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  Patient reports no complaints.  Contractions: Irregular. Vag. Bleeding: None.  Movement: Present. Denies leaking of fluid.   The following portions of the patient's history were reviewed and updated as appropriate: allergies, current medications, past family history, past medical history, past social history, past surgical history and problem list. Problem list updated.  Objective:   Vitals:   06/20/16 1318  BP: 110/76  Pulse: 96  Temp: 98.2 F (36.8 C)  Weight: 194 lb 3.2 oz (88.1 kg)    Fetal Status:     Movement: Present     General:  Alert, oriented and cooperative. Patient is in no acute distress.  Skin: Skin is warm and dry. No rash noted.   Cardiovascular: Normal heart rate noted  Respiratory: Normal respiratory effort, no problems with respiration noted  Abdomen: Soft, gravid, appropriate for gestational age. Pain/Pressure: Present     Pelvic:  Cervical exam deferred        Extremities: Normal range of motion.  Edema: None  Mental Status: Normal mood and affect. Normal behavior. Normal judgment and thought content.   Urinalysis:      Assessment and Plan:  Pregnancy: G3P2002 at [redacted]w[redacted]d  1. Language barrier, cultural differences Speaks english  2. Previous cesarean delivery, antepartum condition or complication CS repeat, h/o cystotomy last CS  Term labor symptoms and general obstetric precautions including but not limited to vaginal bleeding, contractions, leaking of fluid and fetal movement were reviewed in detail with the  patient. Please refer to After Visit Summary for other counseling recommendations.  Return in about 6 weeks (around 08/01/2016), or postpartum, for has pre-op testing 10/9 1030 at [facility].  Woodroe Mode, MD

## 2016-06-20 NOTE — Pre-Procedure Instructions (Signed)
[phone] inter preter number

## 2016-06-20 NOTE — Telephone Encounter (Signed)
Preadmission screen Message left with somali interpreter on her husband's voicemail.  Also, left request at [facility] office to ask her to call me.  She has a 1315 appt with that office today.

## 2016-06-20 NOTE — Progress Notes (Signed)
Patient is in office and states that she has been having pain in her stomach that come and goes, patient states that it may be irregular contractions, patient also stated that she thinks she may have a UTI.

## 2016-06-21 ENCOUNTER — Encounter (HOSPITAL_COMMUNITY): Payer: Self-pay

## 2016-06-21 NOTE — Pre-Procedure Instructions (Signed)
Interpreter number [phone]

## 2016-06-25 ENCOUNTER — Encounter (HOSPITAL_COMMUNITY)
Admission: RE | Admit: 2016-06-25 | Discharge: 2016-06-25 | Disposition: A | Discharge status: Home / Self Care | Payer: Medicaid Other | Source: Ambulatory Visit | Attending: Obstetrics and Gynecology | Admitting: Obstetrics and Gynecology

## 2016-06-25 LAB — CBC
HCT: 32.7 % — ABNORMAL LOW (ref 36.0–46.0)
Hemoglobin: 11.3 g/dL — ABNORMAL LOW (ref 12.0–15.0)
MCH: 30.4 pg (ref 26.0–34.0)
MCHC: 34.6 g/dL (ref 30.0–36.0)
MCV: 87.9 fL (ref 78.0–100.0)
PLATELETS: 313 10*3/uL (ref 150–400)
RBC: 3.72 MIL/uL — AB (ref 3.87–5.11)
RDW: 13.8 % (ref 11.5–15.5)
WBC: 7.7 10*3/uL (ref 4.0–10.5)

## 2016-06-25 LAB — TYPE AND SCREEN
ABO/RH(D): AB POS
ANTIBODY SCREEN: NEGATIVE

## 2016-06-25 NOTE — Patient Instructions (Signed)
20 Michaela Moran  06/25/2016   Your procedure is scheduled on:  06/26/2016  Enter through the Main Entrance of [facility] [place] up the phone at the desk and dial [phone].   Call this number if you have problems the morning of surgery: [phone]   Remember:   Do not eat food:After Midnight.  Do not drink clear liquids: After Midnight.  Take these medicines the morning of surgery with A SIP OF WATER: may take prilosec and reglan if you take it in the morning   Do not wear jewelry, make-up or nail polish.  Do not wear lotions, powders, or perfumes. Do not wear deodorant.  Do not shave 48 hours prior to surgery.  Do not bring valuables to the hospital.  [facility] is not   responsible for any belongings or valuables brought to the hospital.  Contacts, dentures or bridgework may not be worn into surgery.  Leave suitcase in the car. After surgery it may be brought to your room.  For patients admitted to the hospital, checkout time is 11:00 AM the day of              discharge.   Patients discharged the day of surgery will not be allowed to drive             home.  Name and phone number of your driver: na  Special Instructions:   N/A   Please read over the following fact sheets that you were given:   Surgical Site Infection Prevention and Anesthesia Post-op Instructions

## 2016-06-26 ENCOUNTER — Encounter (HOSPITAL_COMMUNITY)
Admission: RE | Disposition: A | Discharge status: Home / Self Care | Payer: Self-pay | Source: Ambulatory Visit | Attending: Obstetrics and Gynecology

## 2016-06-26 ENCOUNTER — Inpatient Hospital Stay (HOSPITAL_COMMUNITY)
Admission: RE | Admit: 2016-06-26 | Discharge: 2016-06-29 | DRG: 766 | Disposition: A | Discharge status: Home / Self Care | Payer: Medicaid Other | Source: Ambulatory Visit | Attending: Obstetrics and Gynecology | Admitting: Obstetrics and Gynecology

## 2016-06-26 ENCOUNTER — Inpatient Hospital Stay (HOSPITAL_COMMUNITY): Payer: Medicaid Other | Admitting: Certified Registered Nurse Anesthetist

## 2016-06-26 ENCOUNTER — Encounter (HOSPITAL_COMMUNITY): Payer: Self-pay | Admitting: *Deleted

## 2016-06-26 DIAGNOSIS — N858 Other specified noninflammatory disorders of uterus: Secondary | ICD-10-CM | POA: Diagnosis not present

## 2016-06-26 DIAGNOSIS — O3483 Maternal care for other abnormalities of pelvic organs, third trimester: Secondary | ICD-10-CM | POA: Diagnosis present

## 2016-06-26 DIAGNOSIS — O34211 Maternal care for low transverse scar from previous cesarean delivery: Secondary | ICD-10-CM | POA: Diagnosis present

## 2016-06-26 DIAGNOSIS — Z8249 Family history of ischemic heart disease and other diseases of the circulatory system: Secondary | ICD-10-CM | POA: Diagnosis not present

## 2016-06-26 DIAGNOSIS — Z823 Family history of stroke: Secondary | ICD-10-CM

## 2016-06-26 DIAGNOSIS — Z833 Family history of diabetes mellitus: Secondary | ICD-10-CM

## 2016-06-26 DIAGNOSIS — Z3A39 39 weeks gestation of pregnancy: Secondary | ICD-10-CM | POA: Diagnosis not present

## 2016-06-26 DIAGNOSIS — N90810 Female genital mutilation status, unspecified: Secondary | ICD-10-CM | POA: Diagnosis present

## 2016-06-26 DIAGNOSIS — O34219 Maternal care for unspecified type scar from previous cesarean delivery: Secondary | ICD-10-CM

## 2016-06-26 DIAGNOSIS — Z98891 History of uterine scar from previous surgery: Secondary | ICD-10-CM

## 2016-06-26 DIAGNOSIS — O09523 Supervision of elderly multigravida, third trimester: Secondary | ICD-10-CM | POA: Diagnosis not present

## 2016-06-26 LAB — RPR: RPR: NONREACTIVE

## 2016-06-26 SURGERY — Surgical Case
Anesthesia: Spinal | Site: Abdomen | Wound class: Clean Contaminated

## 2016-06-26 MED ORDER — ACETAMINOPHEN 325 MG PO TABS
650.0000 mg | ORAL_TABLET | ORAL | Status: DC | PRN
  Administered 2016-06-28: 650 mg via ORAL
  Filled 2016-06-26: qty 2

## 2016-06-26 MED ORDER — NALOXONE HCL 2 MG/2ML IJ SOSY
1.0000 ug/kg/h | PREFILLED_SYRINGE | INTRAVENOUS | Status: DC | PRN
  Filled 2016-06-26: qty 2

## 2016-06-26 MED ORDER — NALBUPHINE HCL 10 MG/ML IJ SOLN: 5.0000 mg | INTRAMUSCULAR | Status: DC | PRN

## 2016-06-26 MED ORDER — OXYCODONE HCL 5 MG PO TABS
10.0000 mg | ORAL_TABLET | ORAL | Status: DC | PRN
  Filled 2016-06-26: qty 2

## 2016-06-26 MED ORDER — KETOROLAC TROMETHAMINE 30 MG/ML IJ SOLN
INTRAMUSCULAR | Status: AC
  Filled 2016-06-26: qty 1

## 2016-06-26 MED ORDER — HYDROMORPHONE HCL 1 MG/ML IJ SOLN: 0.2500 mg | INTRAMUSCULAR | Status: DC | PRN

## 2016-06-26 MED ORDER — IBUPROFEN 600 MG PO TABS: 600.0000 mg | ORAL_TABLET | Freq: Four times a day (QID) | ORAL | Status: DC | PRN

## 2016-06-26 MED ORDER — MORPHINE SULFATE-NACL 0.5-0.9 MG/ML-% IV SOSY
PREFILLED_SYRINGE | INTRAVENOUS | Status: AC
  Filled 2016-06-26: qty 1

## 2016-06-26 MED ORDER — SCOPOLAMINE 1 MG/3DAYS TD PT72
1.0000 | MEDICATED_PATCH | Freq: Once | TRANSDERMAL | Status: DC
  Administered 2016-06-26: 1.5 mg via TRANSDERMAL

## 2016-06-26 MED ORDER — TETANUS-DIPHTH-ACELL PERTUSSIS 5-2.5-18.5 LF-MCG/0.5 IM SUSP: 0.5000 mL | Freq: Once | INTRAMUSCULAR | Status: DC

## 2016-06-26 MED ORDER — MIDAZOLAM HCL 2 MG/2ML IJ SOLN
INTRAMUSCULAR | Status: DC | PRN
  Administered 2016-06-26: 1 mg via INTRAVENOUS

## 2016-06-26 MED ORDER — NALBUPHINE HCL 10 MG/ML IJ SOLN: 5.0000 mg | Freq: Once | INTRAMUSCULAR | Status: DC | PRN

## 2016-06-26 MED ORDER — FENTANYL CITRATE (PF) 100 MCG/2ML IJ SOLN
INTRAMUSCULAR | Status: AC
  Filled 2016-06-26: qty 2

## 2016-06-26 MED ORDER — SODIUM CHLORIDE 0.9% FLUSH: 3.0000 mL | INTRAVENOUS | Status: DC | PRN

## 2016-06-26 MED ORDER — ONDANSETRON HCL 4 MG/2ML IJ SOLN: 4.0000 mg | Freq: Three times a day (TID) | INTRAMUSCULAR | Status: DC | PRN

## 2016-06-26 MED ORDER — SIMETHICONE 80 MG PO CHEW: 80.0000 mg | CHEWABLE_TABLET | ORAL | Status: DC | PRN

## 2016-06-26 MED ORDER — OXYCODONE HCL 5 MG PO TABS
5.0000 mg | ORAL_TABLET | ORAL | Status: DC | PRN
  Administered 2016-06-27 – 2016-06-29 (×5): 5 mg via ORAL
  Filled 2016-06-26 (×4): qty 1

## 2016-06-26 MED ORDER — DIBUCAINE 1 % RE OINT: 1.0000 "application " | TOPICAL_OINTMENT | RECTAL | Status: DC | PRN

## 2016-06-26 MED ORDER — OXYTOCIN 10 UNIT/ML IJ SOLN
INTRAMUSCULAR | Status: AC
  Filled 2016-06-26: qty 4

## 2016-06-26 MED ORDER — PHENYLEPHRINE HCL 10 MG/ML IJ SOLN
INTRAMUSCULAR | Status: DC | PRN
  Administered 2016-06-26: 80 ug via INTRAVENOUS
  Administered 2016-06-26 (×3): 40 ug via INTRAVENOUS

## 2016-06-26 MED ORDER — PROMETHAZINE HCL 25 MG/ML IJ SOLN: 6.2500 mg | INTRAMUSCULAR | Status: DC | PRN

## 2016-06-26 MED ORDER — LACTATED RINGERS IV SOLN
INTRAVENOUS | Status: DC
Start: 2016-06-26 — End: 2016-06-29

## 2016-06-26 MED ORDER — LACTATED RINGERS IV SOLN
INTRAVENOUS | Status: DC
Start: 2016-06-26 — End: 2016-06-26
  Administered 2016-06-26 (×3): via INTRAVENOUS

## 2016-06-26 MED ORDER — PHENYLEPHRINE 40 MCG/ML (10ML) SYRINGE FOR IV PUSH (FOR BLOOD PRESSURE SUPPORT)
PREFILLED_SYRINGE | INTRAVENOUS | Status: AC
  Filled 2016-06-26: qty 10

## 2016-06-26 MED ORDER — DIPHENHYDRAMINE HCL 50 MG/ML IJ SOLN: 12.5000 mg | INTRAMUSCULAR | Status: DC | PRN

## 2016-06-26 MED ORDER — ONDANSETRON HCL 4 MG/2ML IJ SOLN
INTRAMUSCULAR | Status: DC | PRN
  Administered 2016-06-26: 4 mg via INTRAVENOUS

## 2016-06-26 MED ORDER — OXYTOCIN 10 UNIT/ML IJ SOLN
INTRAVENOUS | Status: DC | PRN
  Administered 2016-06-26: 40 [IU] via INTRAVENOUS

## 2016-06-26 MED ORDER — SENNOSIDES-DOCUSATE SODIUM 8.6-50 MG PO TABS
2.0000 | ORAL_TABLET | ORAL | Status: DC
  Administered 2016-06-27 – 2016-06-28 (×3): 2 via ORAL
  Filled 2016-06-26 (×3): qty 2

## 2016-06-26 MED ORDER — SCOPOLAMINE 1 MG/3DAYS TD PT72: 1.0000 | MEDICATED_PATCH | Freq: Once | TRANSDERMAL | Status: DC

## 2016-06-26 MED ORDER — OXYTOCIN 40 UNITS IN LACTATED RINGERS INFUSION - SIMPLE MED: 2.5000 [IU]/h | INTRAVENOUS | Status: AC

## 2016-06-26 MED ORDER — SIMETHICONE 80 MG PO CHEW
80.0000 mg | CHEWABLE_TABLET | ORAL | Status: DC
  Administered 2016-06-27 – 2016-06-28 (×3): 80 mg via ORAL
  Filled 2016-06-26 (×3): qty 1

## 2016-06-26 MED ORDER — LACTATED RINGERS IV SOLN
INTRAVENOUS | Status: DC | PRN
  Administered 2016-06-26: 14:00:00 via INTRAVENOUS

## 2016-06-26 MED ORDER — IBUPROFEN 600 MG PO TABS
600.0000 mg | ORAL_TABLET | Freq: Four times a day (QID) | ORAL | Status: DC
  Administered 2016-06-27 – 2016-06-29 (×11): 600 mg via ORAL
  Filled 2016-06-26 (×11): qty 1

## 2016-06-26 MED ORDER — PHENYLEPHRINE 8 MG IN D5W 100 ML (0.08MG/ML) PREMIX OPTIME
INJECTION | INTRAVENOUS | Status: AC
  Filled 2016-06-26: qty 100

## 2016-06-26 MED ORDER — DIPHENHYDRAMINE HCL 25 MG PO CAPS: 25.0000 mg | ORAL_CAPSULE | Freq: Four times a day (QID) | ORAL | Status: DC | PRN

## 2016-06-26 MED ORDER — ONDANSETRON HCL 4 MG/2ML IJ SOLN
INTRAMUSCULAR | Status: AC
Start: 2016-06-26 — End: 2016-06-26
  Filled 2016-06-26: qty 2

## 2016-06-26 MED ORDER — SODIUM CHLORIDE 0.9 % IR SOLN
Status: DC | PRN
  Administered 2016-06-26: 1

## 2016-06-26 MED ORDER — WITCH HAZEL-GLYCERIN EX PADS: 1.0000 "application " | MEDICATED_PAD | CUTANEOUS | Status: DC | PRN

## 2016-06-26 MED ORDER — ZOLPIDEM TARTRATE 5 MG PO TABS: 5.0000 mg | ORAL_TABLET | Freq: Every evening | ORAL | Status: DC | PRN

## 2016-06-26 MED ORDER — KETOROLAC TROMETHAMINE 30 MG/ML IJ SOLN
30.0000 mg | Freq: Four times a day (QID) | INTRAMUSCULAR | Status: AC | PRN
  Administered 2016-06-26: 30 mg via INTRAMUSCULAR

## 2016-06-26 MED ORDER — COCONUT OIL OIL: 1.0000 "application " | TOPICAL_OIL | Status: DC | PRN

## 2016-06-26 MED ORDER — MEPERIDINE HCL 25 MG/ML IJ SOLN: 6.2500 mg | INTRAMUSCULAR | Status: DC | PRN

## 2016-06-26 MED ORDER — MICROFIBRILLAR COLL HEMOSTAT EX POWD
CUTANEOUS | Status: AC
  Filled 2016-06-26: qty 5

## 2016-06-26 MED ORDER — BUPIVACAINE IN DEXTROSE 0.75-8.25 % IT SOLN
INTRATHECAL | Status: DC | PRN
  Administered 2016-06-26: 1.4 mL via INTRATHECAL

## 2016-06-26 MED ORDER — MIDAZOLAM HCL 2 MG/2ML IJ SOLN
INTRAMUSCULAR | Status: AC
  Filled 2016-06-26: qty 2

## 2016-06-26 MED ORDER — MORPHINE SULFATE (PF) 0.5 MG/ML IJ SOLN
INTRAMUSCULAR | Status: DC | PRN
  Administered 2016-06-26: .2 mg via INTRATHECAL

## 2016-06-26 MED ORDER — FENTANYL CITRATE (PF) 100 MCG/2ML IJ SOLN
INTRAMUSCULAR | Status: DC | PRN
  Administered 2016-06-26: 20 ug via INTRATHECAL
  Administered 2016-06-26 (×2): 50 ug via INTRAVENOUS

## 2016-06-26 MED ORDER — NALOXONE HCL 0.4 MG/ML IJ SOLN: 0.4000 mg | INTRAMUSCULAR | Status: DC | PRN

## 2016-06-26 MED ORDER — CEFAZOLIN SODIUM-DEXTROSE 2-4 GM/100ML-% IV SOLN
2.0000 g | INTRAVENOUS | Status: AC
  Administered 2016-06-26: 2 g via INTRAVENOUS

## 2016-06-26 MED ORDER — SCOPOLAMINE 1 MG/3DAYS TD PT72
MEDICATED_PATCH | TRANSDERMAL | Status: AC
  Filled 2016-06-26: qty 1

## 2016-06-26 MED ORDER — DIPHENHYDRAMINE HCL 25 MG PO CAPS
25.0000 mg | ORAL_CAPSULE | ORAL | Status: DC | PRN
  Filled 2016-06-26: qty 1

## 2016-06-26 MED ORDER — PRENATAL MULTIVITAMIN CH
1.0000 | ORAL_TABLET | Freq: Every day | ORAL | Status: DC
  Administered 2016-06-27 – 2016-06-29 (×3): 1 via ORAL
  Filled 2016-06-26 (×3): qty 1

## 2016-06-26 MED ORDER — KETOROLAC TROMETHAMINE 30 MG/ML IJ SOLN: 30.0000 mg | Freq: Four times a day (QID) | INTRAMUSCULAR | Status: AC | PRN

## 2016-06-26 MED ORDER — LACTATED RINGERS IV SOLN
INTRAVENOUS | Status: DC
  Administered 2016-06-26: 11:00:00 via INTRAVENOUS

## 2016-06-26 MED ORDER — MENTHOL 3 MG MT LOZG: 1.0000 | LOZENGE | OROMUCOSAL | Status: DC | PRN

## 2016-06-26 MED ORDER — ACETAMINOPHEN 500 MG PO TABS
1000.0000 mg | ORAL_TABLET | Freq: Four times a day (QID) | ORAL | Status: AC
  Administered 2016-06-27 (×3): 1000 mg via ORAL
  Filled 2016-06-26 (×3): qty 2

## 2016-06-26 MED ORDER — KETOROLAC TROMETHAMINE 30 MG/ML IJ SOLN: 30.0000 mg | Freq: Once | INTRAMUSCULAR | Status: DC

## 2016-06-26 MED ORDER — PHENYLEPHRINE 8 MG IN D5W 100 ML (0.08MG/ML) PREMIX OPTIME
INJECTION | INTRAVENOUS | Status: DC | PRN
  Administered 2016-06-26: 60 ug/min via INTRAVENOUS

## 2016-06-26 MED ORDER — SIMETHICONE 80 MG PO CHEW
80.0000 mg | CHEWABLE_TABLET | Freq: Three times a day (TID) | ORAL | Status: DC
  Administered 2016-06-27 – 2016-06-29 (×8): 80 mg via ORAL
  Filled 2016-06-26 (×7): qty 1

## 2016-06-26 SURGICAL SUPPLY — 31 items
BENZOIN TINCTURE PRP APPL 2/3 (GAUZE/BANDAGES/DRESSINGS) ×3 IMPLANT
CANISTER SUCT 3000ML PPV (MISCELLANEOUS) ×3 IMPLANT
CHLORAPREP W/TINT 26ML (MISCELLANEOUS) ×3 IMPLANT
DRESSING DISP NPWT PICO 4X12 (MISCELLANEOUS) ×3 IMPLANT
DRSG OPSITE POSTOP 4X10 (GAUZE/BANDAGES/DRESSINGS) ×3 IMPLANT
ELECT REM PT RETURN 9FT ADLT (ELECTROSURGICAL) ×3
ELECTRODE REM PT RTRN 9FT ADLT (ELECTROSURGICAL) ×1 IMPLANT
GLOVE BIOGEL PI IND STRL 7.0 (GLOVE) ×2 IMPLANT
GLOVE BIOGEL PI IND STRL 7.5 (GLOVE) ×1 IMPLANT
GLOVE BIOGEL PI INDICATOR 7.0 (GLOVE) ×4
GLOVE BIOGEL PI INDICATOR 7.5 (GLOVE) ×2
GLOVE SKINSENSE NS SZ7.0 (GLOVE) ×2
GLOVE SKINSENSE STRL SZ7.0 (GLOVE) ×1 IMPLANT
GOWN STRL REUS W/ TWL LRG LVL3 (GOWN DISPOSABLE) ×2 IMPLANT
GOWN STRL REUS W/ TWL XL LVL3 (GOWN DISPOSABLE) ×1 IMPLANT
GOWN STRL REUS W/TWL LRG LVL3 (GOWN DISPOSABLE) ×4
GOWN STRL REUS W/TWL XL LVL3 (GOWN DISPOSABLE) ×2
LIQUID BAND (GAUZE/BANDAGES/DRESSINGS) ×3 IMPLANT
NS IRRIG 1000ML POUR BTL (IV SOLUTION) ×3 IMPLANT
PACK C SECTION WH (CUSTOM PROCEDURE TRAY) ×3 IMPLANT
PAD OB MATERNITY 4.3X12.25 (PERSONAL CARE ITEMS) ×3 IMPLANT
PAD PREP 24X48 CUFFED NSTRL (MISCELLANEOUS) ×3 IMPLANT
STAPLER VISISTAT 35W (STAPLE) ×3 IMPLANT
SUT MON AB 4-0 PS1 27 (SUTURE) ×3 IMPLANT
SUT MON AB-0 CT1 36 (SUTURE) ×6 IMPLANT
SUT PDS AB 0 CTX 60 (SUTURE) ×6 IMPLANT
SUT PLAIN 2 0 (SUTURE) ×2
SUT PLAIN ABS 2-0 CT1 27XMFL (SUTURE) ×1 IMPLANT
SUT VIC AB 0 CT1 36 (SUTURE) ×6 IMPLANT
SUT VIC AB 3-0 CT1 27 (SUTURE) ×2
SUT VIC AB 3-0 CT1 TAPERPNT 27 (SUTURE) ×1 IMPLANT

## 2016-06-26 NOTE — Consult Note (Signed)
Neonatology Note:   Attendance at C-section:    I was asked by Dr. Ilda Basset to attend this repeat C/S at term. The mother is a G3P2, GBS negative with good prenatal care. ROM at delivery, fluid clear. Infant vigorous with good spontaneous cry and tone. Needed only minimal bulb suctioning. Ap 8/9. Lungs clear to ausc in DR. To CN to care of Pediatrician.  Michaela Sabal Katherina Mires, MD

## 2016-06-26 NOTE — Anesthesia Preprocedure Evaluation (Signed)
Anesthesia Evaluation  Patient identified by MRN, date of birth, ID band Patient awake    Reviewed: Allergy & Precautions, H&P , NPO status , Patient's Chart, lab work & pertinent test results  Airway Mallampati: I  TM Distance: >3 FB Neck ROM: full    Dental no notable dental hx.    Pulmonary neg pulmonary ROS,    Pulmonary exam normal        Cardiovascular negative cardio ROS Normal cardiovascular exam     Neuro/Psych negative neurological ROS  negative psych ROS   GI/Hepatic Neg liver ROS,   Endo/Other  negative endocrine ROS  Renal/GU negative Renal ROS     Musculoskeletal   Abdominal (+) + obese,   Peds  Hematology negative hematology ROS (+)   Anesthesia Other Findings   Reproductive/Obstetrics (+) Pregnancy                             Anesthesia Physical Anesthesia Plan  ASA: II  Anesthesia Plan: Spinal   Post-op Pain Management:    Induction:   Airway Management Planned:   Additional Equipment:   Intra-op Plan:   Post-operative Plan:   Informed Consent: I have reviewed the patients History and Physical, chart, labs and discussed the procedure including the risks, benefits and alternatives for the proposed anesthesia with the patient or authorized representative who has indicated his/her understanding and acceptance.     Plan Discussed with: CRNA and Surgeon  Anesthesia Plan Comments:         Anesthesia Quick Evaluation

## 2016-06-26 NOTE — Anesthesia Procedure Notes (Signed)
Spinal  Patient location during procedure: OR Start time: 06/26/2016 1:55 PM End time: 06/26/2016 1:59 PM Staffing Anesthesiologist: Lyn Hollingshead Performed: anesthesiologist  Preanesthetic Checklist Completed: patient identified, surgical consent, pre-op evaluation, timeout performed, IV checked, risks and benefits discussed and monitors and equipment checked Spinal Block Patient position: sitting Prep: site prepped and draped and DuraPrep Patient monitoring: heart rate, cardiac monitor, continuous pulse ox and blood pressure Approach: midline Location: L3-4 Injection technique: single-shot Needle Needle type: Sprotte  Needle gauge: 24 G Needle length: 9 cm Needle insertion depth: 6 cm Assessment Sensory level: T4

## 2016-06-26 NOTE — H&P (Signed)
Obstetrics Admission History & Physical  06/26/2016 - 11:25 AM Primary OBGYN: [facility]  Chief Complaint: for rpt c-section  History of Present Illness  35 y.o. [id number] @ [redacted]w[redacted]d, with the above CC. Pregnancy complicated by: h/o c-section x 2 and G2 cystotomy repair, pelvic adhesive disease, AMA.  [name] states that she's doing well and no maternal/fetal issues.   Review of Systems:her 12 point review of systems is negative or as noted in the History of Present Illness.  PMHx:  Past Medical History:  Diagnosis Date  . Acid reflux   . Female circumcision   . Hemorrhoid   . 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 Hypercholesteremia    PSHx:  Past Surgical History:  Procedure Laterality Date  . CESAREAN SECTION    . CESAREAN SECTION N/A 01/02/2013   Procedure: CESAREAN SECTION;  Surgeon: Donnamae Jude, MD;  Location: [facility];  Service: Obstetrics;  Laterality: N/A;  Incidental Cyystotomy  . CYSTOTOMY     with repair at 2014 repeat c-section   Medications:  Prescriptions Prior to Admission  Medication Sig Dispense Refill Last Dose  . metoCLOPramide (REGLAN) 10 MG tablet Take 1 tablet (10 mg total) by mouth every 8 (eight) hours as needed for nausea or vomiting. 30 tablet 2 06/25/2016 at Unknown time  . omeprazole (PRILOSEC) 20 MG capsule Take 1 capsule (20 mg total) by mouth 2 (two) times daily before a meal. 60 capsule 5 06/25/2016 at Unknown time  . Prenatal Multivit-Min-Fe-FA (PRENATAL VITAMINS) 0.8 MG tablet Take 1 tablet by mouth daily. 30 tablet 12 06/25/2016 at Unknown time  . polyethylene glycol (MIRALAX) packet Take 17 g by mouth daily. (Patient not taking: Reported on 06/26/2016) 14 each 0 Not Taking at Unknown time  . terconazole (TERAZOL 7) 0.4 % vaginal cream Place 1 applicator vaginally at bedtime. (Patient not taking: Reported on 06/20/2016) 45 g 0 Not Taking  . Vitamin D, Cholecalciferol, 1000 units CAPS Take 1 tablet by mouth daily. (Patient not taking: Reported on  06/20/2016) 30 capsule 3 Not Taking     Allergies: is allergic to other. OBHx:  OB History  Gravida Para Term Preterm AB Living  3 2 2     2   SAB TAB Ectopic Multiple Live Births          2    # Outcome Date GA Lbr Len/2nd Weight Sex Delivery Anes PTL Lv  3 Current           2 Term 01/02/13 [redacted]w[redacted]d 14:31 / 13:07 4.095 kg (9 lb 0.5 oz) M CS-LVertical EPI  LIV  1 Term 06/10/10 [redacted]w[redacted]d  3.118 kg (6 lb 14 oz) F CS-Unspec EPI N LIV     Birth Comments: unknown reason for c/section       FHx:  Family History  Problem Relation Age of Onset  . Diabetes Mother   . Diabetes Sister   . Diabetes Brother   . Hypertension Brother   . Stroke Maternal Aunt   . Other Neg Hx    Soc Hx:  Social History   Social History  . Marital status: Married    Spouse name: N/A  . Number of children: N/A  . Years of education: N/A   Occupational History  . Not on file.   Social History Main Topics  . Smoking status: Never Smoker  . Smokeless tobacco: Never Used  . Alcohol use No  . Drug use: No  . Sexual activity: Yes  Birth control/ protection: None   Other Topics Concern  . Not on file   Social History Narrative  . No narrative on file    Objective    Current Vital Signs 24h Vital Sign Ranges  T 98.4 F (36.9 C) Temp  Avg: 98.4 F (36.9 C)  Min: 98.4 F (36.9 C)  Max: 98.4 F (36.9 C)  BP 110/71 BP  Min: 110/71  Max: 110/71  HR (!) 104 Pulse  Avg: 104  Min: 104  Max: 104  RR 18 Resp  Avg: 18  Min: 18  Max: 18  SaO2 100 % Not Delivered SpO2  Avg: 100 %  Min: 100 %  Max: 100 %       24 Hour I/O Current Shift I/O  Time Ins Outs No intake/output data recorded. No intake/output data recorded.    General: Well nourished, well developed female in no acute distress.  Skin:  Warm and dry.  Cardiovascular: S1, S2 normal, no murmur, rub or gallop, regular rate and rhythm Respiratory:  Clear to auscultation bilateral. Normal respiratory effort Abdomen: gravid, nttp. Well healed low  transverse incision Neuro/Psych:  Normal mood and affect.   Labs    Recent Labs Lab 06/25/16 0945  WBC 7.7  HGB 11.3*  HCT 32.7*  PLT 313   AB POS  Radiology Posterior placenta  Perinatal info  AB POS/ Rubella  Immune / Varicella Immune/RPR neg/HIV Negative/HepB Surf Ag Negative/TDaP:declined/pap needed PP/  Assessment & Plan   35 y.o. [id number] @ [redacted]w[redacted]d here for repeat c-section. Pt doing well Pt consented for rpt c-section. D/w her and husband will do vertical midline to decrease adhesive disease risk issues. No BTL. Can proceed when OR is ready  Durene Romans MD Attending Center for [facility])

## 2016-06-26 NOTE — Op Note (Signed)
Operative Note   SURGERY DATE: 06/26/2016  PRE-OP DIAGNOSIS:  *Pregnancy @ 41 and 0 wks * previous c-section   POST-OP DIAGNOSIS: same   PROCEDURE: repeat low transverse cesarean section via vertical skin incision with double layer uterine closure  SURGEON: Surgeon(s) and Role:    * Aletha Halim, MD - Primary    * Jacquiline Doe MD - Fellow  ASSISTANT: none  ANESTHESIA: spinal  ESTIMATED BLOOD LOSS: 800 ml  DRAINS: 400 mL UOP via indwelling foley  VTE PROPHYLAXIS: SCDs to bilateral lower extremities  ANTIBIOTICS: Two grams of Cefazolin were given., within 1 hour of skin incision  SPECIMENS: none  COMPLICATIONS: none  INDICATIONS: repeat  FINDINGS: moderate intra-abdominal adhesions were noted in the area of where the two prior low transverse skin incisions. Most likely another cystotomy would've occurred if another low transverse skin incision would have been performed.. Grossly normal uterus, tubes and ovaries. clear amniotic fluid, cephalic female infant, weight 3170 gm, APGARs 8/9, intact placenta.  PROCEDURE IN DETAIL: The patient was taken to the operating room where anesthesia was administered and normal fetal heart tones were confirmed. She was then prepped and draped in the normal fashion in the dorsal supine position with a leftward tilt.  After a time out was performed, a vertical skin incision was made with the scalpel and carried through to the underlying layer of fascia, just below the umbilicus to approximately 2cm above the prior low transverse skin incision. The fascia was then incised at the midline and this incision was extended superiorly and inferiorly with the bovie. The rectus muscles were then separated in the midline and the peritoneum was entered bluntly. The Alexis retractor was then placed.   A low transverse hysterotomy was made with the scalpel until the endometrial cavity was breached and the amniotic sac ruptured with the Allis clamp, yielding  clear amniotic fluid. This incision was extended bluntly and the infant's head, shoulders and body were delivered atraumatically.The cord was clamped x 2 and cut, and the infant was handed to the awaiting pediatricians, after delayed cord clamping was done.  The placenta was then gradually expressed from the uterus and then the uterus was exteriorized and cleared of all clots and debris. The hysterotomy was repaired with a running suture of 1-0 monocryl. A second imbricating layer of 1-0 monocryl suture was then placed. Several figure-of-eight sutures of 1-0 monocyl were added to achieve excellent hemostasis special attention was given to the right hysterotomy edge.   The uterus and adnexa were then returned to the abdomen, and the hysterotomy and all operative sites were reinspected and excellent hemostasis was noted after irrigation and suction of the abdomen with warm saline.   The peritoneum was closed with a running stitch of 3-0 Vicryl. The fascia was reapproximated with Looped PDS in a simple running fashion, superiorly and inferiorly and tied in the middle. The subcutaneous layer was then reapproximated with interrupted sutures of 2-0 plain gut, and the skin was then closed with staples and PICO wound device placed.   The patient  tolerated the procedure well. Sponge, lap, needle, and instrument counts were correct x 2. The patient was transferred to the recovery room awake, alert and breathing independently in stable condition.  NIcholas Sdhenk. MD [facility] Fellow Center for [facility])  I was present and scrubbed for the entire procedure.  Durene Romans MD Attending Center for [employer])

## 2016-06-26 NOTE — Transfer of Care (Signed)
Immediate Anesthesia Transfer of Care Note  [name]  Procedure(s) Performed: Procedure(s): CESAREAN SECTION (N/A)  Patient Location: PACU  Anesthesia Type:Spinal  Level of Consciousness: awake, alert , oriented and patient cooperative  Airway & Oxygen Therapy: Patient Spontanous Breathing  Post-op Assessment: Report given to RN and Post -op Vital signs reviewed and stable  Post vital signs: Reviewed and stable  Last Vitals:  Vitals:   06/26/16 1026 06/26/16 1548  BP: 110/71 (!) 96/53  Pulse: (!) 104 64  Resp: 18 14  Temp: 36.9 C     Last Pain:  Vitals:   06/26/16 1548  TempSrc:   PainSc: 2       Patients Stated Pain Goal: 3 ([id number])  Complications: No apparent anesthesia complications

## 2016-06-26 NOTE — Anesthesia Postprocedure Evaluation (Signed)
Anesthesia Post Note  [name]  Procedure(s) Performed: Procedure(s) (LRB): CESAREAN SECTION (N/A)  Patient location during evaluation: PACU Anesthesia Type: Spinal Level of consciousness: awake Pain management: pain level controlled Vital Signs Assessment: post-procedure vital signs reviewed and stable Respiratory status: spontaneous breathing Cardiovascular status: stable Postop Assessment: no headache, no backache, spinal receding, patient able to bend at knees and no signs of nausea or vomiting Anesthetic complications: no     Last Vitals:  Vitals:   06/26/16 1645 06/26/16 1656  BP: (!) 60/49   Pulse: (!) 59   Resp: 16   Temp:  36.6 C    Last Pain:  Vitals:   06/26/16 1656  TempSrc: Oral  PainSc:    Pain Goal: Patients Stated Pain Goal: 3 (06/26/16 1026)               Neli Fofana JR,JOHN Mateo Flow

## 2016-06-27 DIAGNOSIS — Z98891 History of uterine scar from previous surgery: Secondary | ICD-10-CM

## 2016-06-27 HISTORY — DX: History of uterine scar from previous surgery: Z98.891

## 2016-06-27 LAB — CBC
HCT: 27.5 % — ABNORMAL LOW (ref 36.0–46.0)
Hemoglobin: 9.8 g/dL — ABNORMAL LOW (ref 12.0–15.0)
MCH: 31.2 pg (ref 26.0–34.0)
MCHC: 35.6 g/dL (ref 30.0–36.0)
MCV: 87.6 fL (ref 78.0–100.0)
PLATELETS: 242 10*3/uL (ref 150–400)
RBC: 3.14 MIL/uL — AB (ref 3.87–5.11)
RDW: 14.1 % (ref 11.5–15.5)
WBC: 9.4 10*3/uL (ref 4.0–10.5)

## 2016-06-27 MED ORDER — INFLUENZA VAC SPLIT QUAD 0.5 ML IM SUSY
0.5000 mL | PREFILLED_SYRINGE | INTRAMUSCULAR | Status: AC
  Administered 2016-06-28: 0.5 mL via INTRAMUSCULAR
  Filled 2016-06-27: qty 0.5

## 2016-06-27 MED ORDER — POLYSACCHARIDE IRON COMPLEX 150 MG PO CAPS
150.0000 mg | ORAL_CAPSULE | Freq: Two times a day (BID) | ORAL | Status: DC
  Administered 2016-06-27 – 2016-06-29 (×5): 150 mg via ORAL
  Filled 2016-06-27 (×5): qty 1

## 2016-06-27 NOTE — Lactation Note (Signed)
This note was copied from a baby's chart. Lactation Consultation Note Experienced BF mom BF her other 2 children for 2 yrs each. Mom has been supplementing baby w/formula d/t she doesn't have milk yet. Mom has large pendulum breast w/LARGE everted nipples. Mom had given formula, stating baby threw ip up. Then she put baby to breast, baby was popping off and on large nipple that just did fit into baby's mouth. [place] doesn't think baby can obtain deep latch. Discussed supplementing and BF first. Mom stated she didn't want the baby to get sick from not enough milk from breast. Educated about colostrum and importance. LC understands its moms cultural belief. Educated newborns feeding habits and behavior. Referred to Baby and Me Book in Breastfeeding section Pg. 22-23 for position options and Proper latch demonstration.Mom encouraged to do skin-to-skin.[place] brochure given w/resources, support groups and [place] services. Patient Name: Michaela Moran Today's Date: 06/27/2016 Reason for consult: Initial assessment   Maternal Data Has patient been taught Hand Expression?: Yes Does the patient have breastfeeding experience prior to this delivery?: Yes  Feeding Feeding Type: Formula Nipple Type: Slow - flow Length of feed: 7 min  LATCH Score/Interventions Latch: Repeated attempts needed to sustain latch, nipple held in mouth throughout feeding, stimulation needed to elicit sucking reflex.  Audible Swallowing: None  Type of Nipple: Everted at rest and after stimulation  Comfort (Breast/Nipple): Soft / non-tender     Hold (Positioning): No assistance needed to correctly position infant at breast.  LATCH Score: 7  Lactation Tools Discussed/Used     Consult Status Consult Status: Follow-up Date: 06/27/16 (in pm) Follow-up type: In-patient    Bob Eastwood, Elta [place] 06/27/2016, 6:15 AM

## 2016-06-27 NOTE — Progress Notes (Signed)
Subjective: Postpartum Day #1: Cesarean Delivery, vertical incision.  Patient reports incisional pain, tolerating PO and no problems voiding.    Objective: Vital signs in last 24 hours: Temp:  [96.3 F (35.7 C)-98.4 F (36.9 C)] 97.9 F (36.6 C) (10/11 0500) Pulse Rate:  [54-104] 72 (10/11 0500) Resp:  [11-20] 16 (10/11 0500) BP: (60-110)/(49-73) 97/57 (10/11 0500) SpO2:  [79 %-100 %] 100 % (10/10 1910)  Physical Exam:  General: alert, cooperative and no distress Lochia: appropriate Uterine Fundus: firm Incision: no significant drainage, no dehiscence, no significant erythema, PICO wound vac in place.  DVT Evaluation: No evidence of DVT seen on physical exam. No cords or calf tenderness. No significant calf/ankle edema.   Recent Labs  06/25/16 0945 06/27/16 0630  HGB 11.3* 9.8*  HCT 32.7* 27.5*    Assessment/Plan: Status post Cesarean section. Doing well postoperatively.  Continue current care.  Morene Crocker, CNM 06/27/2016, 8:22 AM

## 2016-06-27 NOTE — Progress Notes (Signed)
UR chart review completed.  

## 2016-06-27 NOTE — Lactation Note (Signed)
This note was copied from a baby's chart. Lactation Consultation Note  Patient Name: Michaela Moran Today's Date: 06/27/2016 Reason for consult: Follow-up assessment Baby at 26 hr of life. Mom denies breast or nipple pain, voiced no concerns. She desires to offer breast and formula. She understands the risks of formula. Baby was sleeping in basinet at the bed side, mom declined latch help at this visit. She will call for lactation at the next bf. Discussed baby behavior, feeding frequency, baby belly size, voids, wt loss, breast changes, and nipple care. She is aware of lactation services and support group.    Maternal Data    Feeding    LATCH Score/Interventions                      Lactation Tools Discussed/Used     Consult Status Consult Status: Follow-up Date: 06/27/16 Follow-up type: In-patient    Denzil Hughes 06/27/2016, 4:55 PM

## 2016-06-28 NOTE — Progress Notes (Signed)
Subjective: Postpartum Day #2: rpt vertical Cesarean Delivery Patient reports nausea, incisional pain, tolerating PO and no problems voiding.  States increased pain, encouraged ambulation and getting up out of bed.    Objective: Vital signs in last 24 hours: Temp:  [97.9 F (36.6 C)-98.1 F (36.7 C)] 98 F (36.7 C) (10/12 0700) Pulse Rate:  [60-74] 60 (10/12 0700) Resp:  [18] 18 (10/12 0700) BP: (86-99)/(46-61) 86/46 (10/12 0700) Weight:  [194 lb (88 kg)] 194 lb (88 kg) (10/11 2100)  Physical Exam:  General: alert, cooperative and no distress  Lungs:  CTA bilaterally.  Lochia: appropriate Uterine Fundus: firm Incision: no significant drainage, no dehiscence, no significant erythema, PICO dressing intact.  DVT Evaluation: No evidence of DVT seen on physical exam. No cords or calf tenderness. No significant calf/ankle edema.   Recent Labs  06/25/16 0945 06/27/16 0630  HGB 11.3* 9.8*  HCT 32.7* 27.5*    Assessment/Plan: Status post Cesarean section. Doing well postoperatively.  Continue current care.  Anticipate d/c home 06/29/16.    Maniyah Moller A Lavin Petteway 06/28/2016, 8:49 AM

## 2016-06-28 NOTE — Lactation Note (Signed)
This note was copied from a baby's chart. Lactation Consultation Note: Mother has infant swaddle to her chest. She just finished feeding. She denies having any questions. Mother breastfeed her two other children for 2 years.  Patient Name: Boy Loredana Bonura Today's Date: 06/28/2016     Maternal Data    Feeding Feeding Type: Breast Fed Length of feed: 30 min  LATCH Score/Interventions Latch: Grasps breast easily, tongue down, lips flanged, rhythmical sucking.  Audible Swallowing: Spontaneous and intermittent  Type of Nipple: Everted at rest and after stimulation  Comfort (Breast/Nipple): Soft / non-tender     Hold (Positioning): No assistance needed to correctly position infant at breast.  LATCH Score: 10  Lactation Tools Discussed/Used     Consult Status      Darla Lesches 06/28/2016, 12:36 PM

## 2016-06-28 NOTE — Lactation Note (Signed)
This note was copied from a baby's chart. Lactation Consultation Note  Patient Name: Michaela Moran Today's Date: 06/28/2016   Baby 31 hours old. Offered to obtain interpreter, but mom declined stating that she understood what this LC was saying. Mom reports that the baby seems to be spitty like her older daughter who has reflux. Mom states that she has lots of breast milk flowing, but she has also been supplementing with formula. Enc mom to attempt to just breastfeed since she is producing lots of breast milk, and to hold baby upright after feeding to allow milk to settle. Patient's bedside RN, Maudie Mercury, reports that baby gulping at the breast. Mom wanting the baby checked before she is discharged tomorrow. Discussed mom's request with Maudie Mercury, RN, who stated that she has already notified peds.  Maternal Data    Feeding Feeding Type: Formula Length of feed: 25 min  LATCH Score/Interventions                      Lactation Tools Discussed/Used     Consult Status      Andres Labrum 06/28/2016, 6:24 PM

## 2016-06-29 MED ORDER — SENNOSIDES-DOCUSATE SODIUM 8.6-50 MG PO TABS: 2.0000 | ORAL_TABLET | Freq: Two times a day (BID) | ORAL | 2 refills | Status: DC

## 2016-06-29 MED ORDER — IBUPROFEN 600 MG PO TABS: 600.0000 mg | ORAL_TABLET | Freq: Four times a day (QID) | ORAL | 2 refills | Status: DC

## 2016-06-29 MED ORDER — FUSION PLUS PO CAPS: 1.0000 | ORAL_CAPSULE | Freq: Two times a day (BID) | ORAL | 3 refills | Status: DC

## 2016-06-29 MED ORDER — OXYCODONE-ACETAMINOPHEN 5-325 MG PO TABS: 1.0000 | ORAL_TABLET | ORAL | 0 refills | Status: DC | PRN

## 2016-06-29 MED ORDER — COCONUT OIL OIL: 1.0000 "application " | TOPICAL_OIL | 99 refills | Status: DC | PRN

## 2016-06-29 MED ORDER — SIMETHICONE 80 MG PO CHEW: 80.0000 mg | CHEWABLE_TABLET | Freq: Four times a day (QID) | ORAL | 0 refills | Status: DC | PRN

## 2016-06-29 NOTE — Lactation Note (Signed)
This note was copied from a baby's chart. Lactation Consultation Note  Patient Name: Michaela Moran Today's Date: 06/29/2016  Parents interested in getting a pump. I explained our [facility] loaner program, but Mom stated that she would contact [place] directly. Mom shown how to assemble & use hand pump that was included in pump kit for her to use in the meantime. Parents have no other questions or concerns at this time.   Matthias Hughs [facility] 06/29/2016, 12:26 PM

## 2016-06-29 NOTE — Discharge Summary (Signed)
Obstetric Discharge Summary Reason for Admission: cesarean section Prenatal Procedures: NST and ultrasound Intrapartum Procedures: cesarean: low cervical, vertical and cesarean: classical Postpartum Procedures: none Complications-Operative and Postpartum: none and has verticle incision with staples and PICO wound vac Hemoglobin  Date Value Ref Range Status  06/27/2016 9.8 (L) 12.0 - 15.0 g/dL Final   HCT  Date Value Ref Range Status  06/27/2016 27.5 (L) 36.0 - 46.0 % Final   Hematocrit  Date Value Ref Range Status  05/02/2016 33.7 (L) 34.0 - 46.6 % Final    Physical Exam:  General: alert, cooperative and no distress Lochia: appropriate Uterine Fundus: firm Incision: healing well, no significant drainage, no dehiscence, no significant erythema, PICO wound vac working DVT Evaluation: No evidence of DVT seen on physical exam. No cords or calf tenderness. No significant calf/ankle edema.  Discharge Diagnoses: Term Pregnancy-delivered and AMA, repeat Verticle C-section with PICO wound vac  Discharge Information: Date: 06/29/2016 Activity: pelvic rest Diet: routine Medications: PNV, Ibuprofen, Colace, Iron and Percocet Condition: stable Instructions: refer to practice specific booklet Discharge to: home Follow-up Information    Nason Conradt A Caedin Mogan, CNM Follow up in 1 week(s).   Specialty:  Certified Nurse Midwife Why:  incision check, staple removal Contact information: [address] [place] [address] [phone]           Newborn Data: Live born female  Birth Weight: 6 lb 15.8 oz (3170 g) APGAR: 8, 9  Home with mother.  Morene Crocker, CNM 06/29/2016, 7:58 AM

## 2016-07-05 ENCOUNTER — Ambulatory Visit: Payer: Medicaid Other | Admitting: Obstetrics and Gynecology

## 2016-07-05 ENCOUNTER — Telehealth: Payer: Self-pay | Admitting: *Deleted

## 2016-07-05 ENCOUNTER — Encounter: Payer: Self-pay | Admitting: Obstetrics and Gynecology

## 2016-07-05 VITALS — BP 109/76 | HR 74 | Temp 97.6°F | Wt 185.0 lb

## 2016-07-05 DIAGNOSIS — R300 Dysuria: Secondary | ICD-10-CM

## 2016-07-05 DIAGNOSIS — Z9889 Other specified postprocedural states: Secondary | ICD-10-CM

## 2016-07-05 DIAGNOSIS — Z5189 Encounter for other specified aftercare: Secondary | ICD-10-CM

## 2016-07-05 LAB — POCT URINALYSIS DIPSTICK
Bilirubin, UA: NEGATIVE
Blood, UA: 250
GLUCOSE UA: NEGATIVE
Ketones, UA: NEGATIVE
NITRITE UA: NEGATIVE
PROTEIN UA: 1
Spec Grav, UA: 1.02
UROBILINOGEN UA: NEGATIVE
pH, UA: 5

## 2016-07-05 NOTE — Progress Notes (Signed)
35 yo s/p repeat cesarean section on 06/26/2016 with vertical skin incision presenting today for wound check and staple removal. Patient reports feeling well since her surgery. She denies any fever, chills, or abnormal drainage from the incision. Her pain is well controlled with ibuprofen and percocet. She has not been sexually active. She is breastfeeding without issues. She plans on using Nexplanon for contraception. She denies si/sx of depression.  Patient complaining of frequency and dyuria  Past Medical History:  Diagnosis Date  . Acid reflux   . Female circumcision   . Hemorrhoid   . 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 Hypercholesteremia    Past Surgical History:  Procedure Laterality Date  . CESAREAN SECTION    . CESAREAN SECTION N/A 01/02/2013   Procedure: CESAREAN SECTION;  Surgeon: Donnamae Jude, MD;  Location: [facility];  Service: Obstetrics;  Laterality: N/A;  Incidental Cyystotomy  . CESAREAN SECTION N/A 06/26/2016   Procedure: CESAREAN SECTION;  Surgeon: Aletha Halim, MD;  Location: [facility];  Service: Obstetrics;  Laterality: N/A;  . CYSTOTOMY     with repair at 2014 repeat c-section   Family History  Problem Relation Age of Onset  . Diabetes Mother   . Diabetes Sister   . Diabetes Brother   . Hypertension Brother   . Stroke Maternal Aunt   . Other Neg Hx    Social History  Substance Use Topics  . Smoking status: Never Smoker  . Smokeless tobacco: Never Used  . Alcohol use No   ROS See pertinent in HPI  GENERAL: Well-developed, well-nourished female in no acute distress.  ABDOMEN: Soft, nontender, nondistended.  Incision: no erythema, induration and drainage PELVIC: Not performed EXTREMITIES: No cyanosis, clubbing, or edema, 2+ distal pulses.  A/P 35 yo here for wound check s/p cesarean section on 10/10 - PICO dressing and staples removed. Steri-strips applied - Reviewed postop and postpartum care - RTC in 4 weeks for postpartum visit and  Nexplanon insertion - Urine culture ordered - patient will be contacted with results

## 2016-07-05 NOTE — Telephone Encounter (Signed)
Sharyn Lull form [employer] was out to see the patient this morning and in speaking with her was told that she is having burning with urination for 3 days now. Since she is coming to the office today - she was reminded to let her provider know about her symptoms. Patient had left office when I got the call in messages - UA preformed and culture was ordered based off of result and symptoms.

## 2016-07-07 LAB — URINE CULTURE

## 2016-08-02 ENCOUNTER — Ambulatory Visit: Payer: Medicaid Other | Admitting: Certified Nurse Midwife

## 2016-08-21 ENCOUNTER — Ambulatory Visit (INDEPENDENT_AMBULATORY_CARE_PROVIDER_SITE_OTHER): Payer: Medicaid Other | Admitting: Certified Nurse Midwife

## 2016-08-21 ENCOUNTER — Encounter: Payer: Self-pay | Admitting: *Deleted

## 2016-08-21 ENCOUNTER — Encounter: Payer: Self-pay | Admitting: Certified Nurse Midwife

## 2016-08-21 VITALS — BP 130/83 | HR 69

## 2016-08-21 DIAGNOSIS — R7989 Other specified abnormal findings of blood chemistry: Secondary | ICD-10-CM

## 2016-08-21 DIAGNOSIS — Z30017 Encounter for initial prescription of implantable subdermal contraceptive: Secondary | ICD-10-CM | POA: Diagnosis not present

## 2016-08-21 MED ORDER — VITAMIN D (ERGOCALCIFEROL) 1.25 MG (50000 UNIT) PO CAPS: 50000.0000 [IU] | ORAL_CAPSULE | ORAL | 2 refills | Status: DC

## 2016-08-21 MED ORDER — ETONOGESTREL 68 MG ~~LOC~~ IMPL
68.0000 mg | DRUG_IMPLANT | Freq: Once | SUBCUTANEOUS | Status: AC
  Administered 2016-08-21: 68 mg via SUBCUTANEOUS

## 2016-08-21 NOTE — Progress Notes (Signed)
Post Partum Exam  Michaela Moran is a 35 y.o. [id number] female who presents for a postpartum visit. She is 7 weeks postpartum following a low cervical transverse Cesarean section. I have fully reviewed the prenatal and intrapartum course. The delivery was at 13 gestational weeks.  Anesthesia: spinalPostpartum course has been doing well. Baby's course has been doing well. Baby is feeding by breast. Bleeding thin lochia. Bowel function is normal. Bladder function is normal. Patient is not sexually active. Contraception method is none. Postpartum depression screening:neg

## 2016-08-21 NOTE — Progress Notes (Signed)
Nexplanon Procedure Note   PRE-OP DIAGNOSIS: desired long-term, reversible contraception  POST-OP DIAGNOSIS: Same  PROCEDURE: Nexplanon  placement Performing Provider: Kandis Cocking CNM   Patient education prior to procedure, explained risk, benefits of Nexplanon, reviewed alternative options. Patient reported understanding. Gave consent to continue with procedure.   PROCEDURE:  Pregnancy Text :  Negative Site (check):      left arm         Sterile Preparation:   Betadinex3 Lot # [id number] Expiration Date 11/2018  Insertion site was selected 8 - 10 cm from medial epicondyle and marked along with guiding site using sterile marker. Procedure area was prepped and draped in a sterile fashion. 1% Lidocaine 1.5 ml given prior to procedure. Nexplanon  was inserted subcutaneously.Needle was removed from the insertion site. Nexplanon capsule was palpated by provider and patient to assure satisfactory placement. And a bandage applied and the arm was wrapped with gauze bandage.     Followup: The patient tolerated the procedure well without complications.  Instructions:  The patient was instructed to remove the dressing in 24 hours and that some bruising is to be expected.  She was advised to use over the counter analgesics as needed for any pain at the site.  She is to keep the area dry for 24 hours and to call if her hand or arm becomes cold, numb, or blue.   Kandis Cocking CNM

## 2016-11-09 ENCOUNTER — Encounter (HOSPITAL_COMMUNITY): Payer: Self-pay | Admitting: *Deleted

## 2016-11-09 ENCOUNTER — Emergency Department (HOSPITAL_COMMUNITY)
Admission: EM | Admit: 2016-11-09 | Discharge: 2016-11-09 | Disposition: A | Discharge status: Home / Self Care | Payer: Medicaid Other | Attending: Emergency Medicine | Admitting: Emergency Medicine

## 2016-11-09 DIAGNOSIS — R300 Dysuria: Secondary | ICD-10-CM

## 2016-11-09 DIAGNOSIS — R102 Pelvic and perineal pain: Secondary | ICD-10-CM | POA: Insufficient documentation

## 2016-11-09 DIAGNOSIS — R519 Headache, unspecified: Secondary | ICD-10-CM

## 2016-11-09 DIAGNOSIS — R51 Headache: Secondary | ICD-10-CM | POA: Insufficient documentation

## 2016-11-09 LAB — URINALYSIS, ROUTINE W REFLEX MICROSCOPIC
Bilirubin Urine: NEGATIVE
Glucose, UA: NEGATIVE mg/dL
Ketones, ur: NEGATIVE mg/dL
Leukocytes, UA: NEGATIVE
Nitrite: NEGATIVE
Protein, ur: NEGATIVE mg/dL
Specific Gravity, Urine: 1.003 — ABNORMAL LOW (ref 1.005–1.030)
pH: 6 (ref 5.0–8.0)

## 2016-11-09 LAB — COMPREHENSIVE METABOLIC PANEL
ALBUMIN: 3.7 g/dL (ref 3.5–5.0)
ALK PHOS: 86 U/L (ref 38–126)
ALT: 26 U/L (ref 14–54)
ANION GAP: 9 (ref 5–15)
AST: 21 U/L (ref 15–41)
BUN: 14 mg/dL (ref 6–20)
CALCIUM: 9.3 mg/dL (ref 8.9–10.3)
CHLORIDE: 108 mmol/L (ref 101–111)
CO2: 22 mmol/L (ref 22–32)
Creatinine, Ser: 0.78 mg/dL (ref 0.44–1.00)
GFR calc non Af Amer: >60 mL/min (ref >60)
GLUCOSE: 101 mg/dL — AB (ref 65–99)
POTASSIUM: 3.7 mmol/L (ref 3.5–5.1)
SODIUM: 139 mmol/L (ref 135–145)
Total Bilirubin: 0.6 mg/dL (ref 0.3–1.2)
Total Protein: 7.3 g/dL (ref 6.5–8.1)

## 2016-11-09 LAB — CBC
HEMATOCRIT: 38.5 % (ref 36.0–46.0)
HEMOGLOBIN: 13 g/dL (ref 12.0–15.0)
MCH: 29 pg (ref 26.0–34.0)
MCHC: 33.8 g/dL (ref 30.0–36.0)
MCV: 85.9 fL (ref 78.0–100.0)
Platelets: 297 10*3/uL (ref 150–400)
RBC: 4.48 MIL/uL (ref 3.87–5.11)
RDW: 14.7 % (ref 11.5–15.5)
WBC: 5.5 10*3/uL (ref 4.0–10.5)

## 2016-11-09 LAB — LIPASE, BLOOD: LIPASE: 36 U/L (ref 11–51)

## 2016-11-09 LAB — PREGNANCY, URINE: Preg Test, Ur: NEGATIVE

## 2016-11-09 LAB — WET PREP, GENITAL
Clue Cells Wet Prep HPF POC: NONE SEEN
Sperm: NONE SEEN
TRICH WET PREP: NONE SEEN
Yeast Wet Prep HPF POC: NONE SEEN

## 2016-11-09 LAB — CBG MONITORING, ED: Glucose-Capillary: 104 mg/dL — ABNORMAL HIGH (ref 65–99)

## 2016-11-09 NOTE — ED Notes (Signed)
Pt stated she wanted her sugar checked. Pt's CBG 104.

## 2016-11-09 NOTE — ED Triage Notes (Addendum)
Pt c/o HA onset x 1 wk, pt concerned she may have diabetes, pt reports polyuria & polydipsia, pt c/o bil abd pain intermittent, denies n/v/d, A&O x4

## 2016-11-09 NOTE — ED Provider Notes (Signed)
[facility] DEPT Provider Note   CSN: [id number] Arrival date & time: 11/09/16  1609     History   Chief Complaint Chief Complaint  Patient presents with  . Headache    HPI Michaela Moran is a 36 y.o. female.  HPI [place] translator used  Family history of DM Lots of urination, kidney pain and headache, concern for DM Has had these symptoms for about 1 week Dysuria for 1 week 4 days of headache, no fever, no head trauma, headache began slowly and has increased. Better with medicine then comes back.  No hx of this in the past.    No nausea, no vomiting, no vaginal discharge or bleeding     Past Medical History:  Diagnosis Date  . Acid reflux   . Female circumcision   . Hemorrhoid   . 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 Hypercholesteremia     Patient Active Problem List   Diagnosis Date Noted  . Encounter for initial prescription of Nexplanon 08/21/2016  . S/P repeat low transverse C-section 06/27/2016  . 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 Previous cesarean delivery, antepartum condition or complication [id number]  . Language barrier, cultural differences 04/28/2016  . Advanced maternal age in multigravida     Past Surgical History:  Procedure Laterality Date  . CESAREAN SECTION    . CESAREAN SECTION N/A 01/02/2013   Procedure: CESAREAN SECTION;  Surgeon: Donnamae Jude, MD;  Location: [facility];  Service: Obstetrics;  Laterality: N/A;  Incidental Cyystotomy  . CESAREAN SECTION N/A 06/26/2016   Procedure: CESAREAN SECTION;  Surgeon: Aletha Halim, MD;  Location: [facility];  Service: Obstetrics;  Laterality: N/A;  . CYSTOTOMY     with repair at 2014 repeat c-section    OB History    Gravida Para Term Preterm AB Living   3 3 3     3    SAB TAB Ectopic Multiple Live Births         0 3       Home Medications    Prior to Admission medications   Medication Sig Start Date End Date  Taking? Authorizing Provider  coconut oil OIL Apply 1 application topically as needed. Patient not taking: Reported on 07/05/2016 06/29/16   Rachelle A Denney, CNM  ibuprofen (ADVIL,MOTRIN) 600 MG tablet Take 1 tablet (600 mg total) by mouth every 6 (six) hours. 06/29/16   Rachelle A Denney, CNM  Iron-FA-B Cmp-C-Biot-Probiotic (FUSION PLUS) CAPS Take 1 tablet by mouth 2 (two) times daily. 06/29/16   Rachelle A Denney, CNM  omeprazole (PRILOSEC) 20 MG capsule Take 1 capsule (20 mg total) by mouth 2 (two) times daily before a meal. Patient not taking: Reported on 08/21/2016 02/14/16   Shelly Bombard, MD  polyethylene glycol [facility]) packet Take 17 g by mouth daily. 04/28/16   Seabron Spates, CNM  Prenatal Multivit-Min-Fe-FA (PRENATAL VITAMINS) 0.8 MG tablet Take 1 tablet by mouth daily. 04/28/16   Seabron Spates, CNM  senna-docusate (SENOKOT-S) 8.6-50 MG tablet Take 2 tablets by mouth 2 (two) times daily. 06/29/16   Rachelle A Denney, CNM  simethicone (MYLICON) 80 MG chewable tablet Chew 1 tablet (80 mg total) by mouth 4 (four) times daily as needed for flatulence. Patient not taking: Reported on 07/05/2016 06/29/16   Rachelle A Denney, CNM  Vitamin D, Cholecalciferol, 1000 units CAPS Take 1 tablet by mouth daily. Patient not taking: Reported on 08/21/2016 05/02/16  Mora Bellman, MD  Vitamin D, Ergocalciferol, (DRISDOL) 50000 units CAPS capsule Take 1 capsule (50,000 Units total) by mouth every 7 (seven) days. 08/21/16   Morene Crocker, CNM    Family History Family History  Problem Relation Age of Onset  . Diabetes Mother   . Diabetes Sister   . Diabetes Brother   . Hypertension Brother   . Stroke Maternal Aunt   . Other Neg Hx     Social History Social History  Substance Use Topics  . Smoking status: Never Smoker  . Smokeless tobacco: Never Used  . Alcohol use No     Allergies   Other   Review of Systems Review of Systems  Constitutional: Negative for fever.  HENT:  Negative for congestion, ear pain and sore throat.   Eyes: Negative for visual disturbance.  Respiratory: Negative for cough (2 wk ago now improved) and shortness of breath.   Cardiovascular: Negative for chest pain.  Gastrointestinal: Negative for abdominal pain, constipation, diarrhea, nausea and vomiting.  Endocrine: Positive for polyuria.  Genitourinary: Positive for dysuria and frequency. Negative for difficulty urinating.  Musculoskeletal: Negative for back pain.  Skin: Negative for rash.  Neurological: Positive for headaches. Negative for syncope.     Physical Exam Updated Vital Signs BP 106/68 (BP Location: Right Arm)   Pulse 64   Temp 98.2 F (36.8 C) (Oral)   Resp 12   Ht 5\' 6"  (1.676 m)   Wt 198 lb (89.8 kg)   SpO2 100%   BMI 31.96 kg/m   Physical Exam  Constitutional: She is oriented to person, place, and time. She appears well-developed and well-nourished. No distress.  HENT:  Head: Normocephalic and atraumatic.  Eyes: Conjunctivae and EOM are normal.  Neck: Normal range of motion.  Cardiovascular: Normal rate, regular rhythm, normal heart sounds and intact distal pulses.  Exam reveals no gallop and no friction rub.   No murmur heard. Pulmonary/Chest: Effort normal and breath sounds normal. No respiratory distress. She has no wheezes. She has no rales.  Abdominal: Soft. She exhibits no distension. There is tenderness (mild suprapubic bilateral lower). There is no guarding.  No CVA tenderness  Musculoskeletal: She exhibits no edema or tenderness.  Neurological: She is alert and oriented to person, place, and time.  Skin: Skin is warm and dry. No rash noted. She is not diaphoretic. No erythema.  Nursing note and vitals reviewed.    ED Treatments / Results  Labs (all labs ordered are listed, but only abnormal results are displayed) Labs Reviewed  WET PREP, GENITAL - Abnormal; Notable for the following:       Result Value   WBC, Wet Prep HPF POC MANY (*)     All other components within normal limits  COMPREHENSIVE METABOLIC PANEL - Abnormal; Notable for the following:    Glucose, Bld 101 (*)    All other components within normal limits  URINALYSIS, ROUTINE W REFLEX MICROSCOPIC - Abnormal; Notable for the following:    Color, Urine COLORLESS (*)    Specific Gravity, Urine 1.003 (*)    Hgb urine dipstick SMALL (*)    Bacteria, UA RARE (*)    Squamous Epithelial / LPF 0-5 (*)    All other components within normal limits  CBG MONITORING, ED - Abnormal; Notable for the following:    Glucose-Capillary 104 (*)    All other components within normal limits  LIPASE, BLOOD  CBC  PREGNANCY, URINE  GC/CHLAMYDIA PROBE AMP ([place]) NOT AT  [place]    EKG  EKG Interpretation None       Radiology No results found.  Procedures Procedures (including critical care time)  Medications Ordered in ED Medications - No data to display   Initial Impression / Assessment and Plan / ED Course  I have reviewed the triage vital signs and the nursing notes.  Pertinent labs & imaging results that were available during my care of the patient were reviewed by me and considered in my medical decision making (see chart for details).    36 year old female from [place] with history of C-sections, pelvic adhesive disease, hypercholesterolemia, presents with concern for headache, urinary frequency, bilateral flank pain and lower abdominal pain. Patient reports recent flulike illness.  Headache began slowly, no trauma, no fevers, and have low suspicion for [facility], SDH or meningitis.  No sig headache at this time.  Her abdominal exam is nonfocal, she is afebrile, without leukocytosis, have low suspicion for appendicitis, diverticulitis or cholecystitis. She describes dysuria and frequency, however urinalysis shows no sign of UTI. Performed pelvic exam. Wet prep is negative for Trichomonas, bacterial vaginosis Ralene Bathe. Offered empiric treatment for STI is, however patient  reports low suspicion for this, and given this have low suspicion for PID at this time. Sent GC chlamydia,  and if this does come back positive, would consider 14 day treatment for pelvic inflammatory disease. However at this time suspect some of these symptoms may be post-viral and secondary to her recorded hx of pelvic adhesive disease. Patient concerned symptoms represent diabetes, however she has no sign of this on her emergency department lab work. Recommend PCP follow up.   Patient discharged in stable condition with understanding of reasons to return.    Final Clinical Impressions(s) / ED Diagnoses   Final diagnoses:  Nonintractable headache, unspecified chronicity pattern, unspecified headache type  Dysuria  Pelvic pain    New Prescriptions New Prescriptions   No medications on file     Gareth Morgan, MD 11/09/16 2041

## 2016-11-09 NOTE — ED Notes (Signed)
Pt verbalized understanding of d/c instructions and has no further questions. Pt is stable, A&Ox4, VSS.  

## 2016-11-12 LAB — GC/CHLAMYDIA PROBE AMP (~~LOC~~) NOT AT ARMC
CHLAMYDIA, DNA PROBE: NEGATIVE
NEISSERIA GONORRHEA: NEGATIVE

## 2017-01-26 ENCOUNTER — Encounter (HOSPITAL_COMMUNITY): Payer: Self-pay | Admitting: Emergency Medicine

## 2017-01-26 ENCOUNTER — Ambulatory Visit (HOSPITAL_COMMUNITY)
Admission: EM | Admit: 2017-01-26 | Discharge: 2017-01-26 | Disposition: A | Discharge status: Home / Self Care | Payer: Medicaid Other | Attending: Internal Medicine | Admitting: Internal Medicine

## 2017-01-26 DIAGNOSIS — J069 Acute upper respiratory infection, unspecified: Secondary | ICD-10-CM | POA: Diagnosis not present

## 2017-01-26 DIAGNOSIS — H9202 Otalgia, left ear: Secondary | ICD-10-CM

## 2017-01-26 MED ORDER — DEXTROMETHORPHAN HBR 10 MG/5ML PO LIQD: 10.0000 mg | ORAL | 0 refills | Status: AC

## 2017-01-26 MED ORDER — AMOXICILLIN 500 MG PO TABS: 1000.0000 mg | ORAL_TABLET | Freq: Three times a day (TID) | ORAL | 0 refills | Status: AC

## 2017-01-26 NOTE — ED Triage Notes (Signed)
Headache, muscle soreness, cough-2 weeks ago

## 2017-01-26 NOTE — ED Provider Notes (Signed)
CSN: [id number]     Arrival date & time 01/26/17  1839 History   First MD Initiated Contact with Patient 01/26/17 2046     Chief Complaint  Patient presents with  . URI   (Consider location/radiation/quality/duration/timing/severity/associated sxs/prior Treatment) Patient is a 36 y.o. Female, was sick 2 weeks ago with cold symptoms and her symptoms resolved but came back today with headache, sore throat, occasional dizziness, fatigue, coughing, fever (did not check temp), and body ache. She have been taking tylenol at home. Her daughter is sick with similar symptoms. She also endorses left ear pain.       Past Medical History:  Diagnosis Date  . Acid reflux   . Female circumcision   . Hemorrhoid   . 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 Hypercholesteremia    Past Surgical History:  Procedure Laterality Date  . CESAREAN SECTION    . CESAREAN SECTION N/A 01/02/2013   Procedure: CESAREAN SECTION;  Surgeon: Donnamae Jude, MD;  Location: [facility];  Service: Obstetrics;  Laterality: N/A;  Incidental Cyystotomy  . CESAREAN SECTION N/A 06/26/2016   Procedure: CESAREAN SECTION;  Surgeon: Aletha Halim, MD;  Location: [facility];  Service: Obstetrics;  Laterality: N/A;  . CYSTOTOMY     with repair at 2014 repeat c-section   Family History  Problem Relation Age of Onset  . Diabetes Mother   . Diabetes Sister   . Diabetes Brother   . Hypertension Brother   . Stroke Maternal Aunt   . Other Neg Hx    Social History  Substance Use Topics  . Smoking status: Never Smoker  . Smokeless tobacco: Never Used  . Alcohol use No   OB History    Gravida Para Term Preterm AB Living   3 3 3     3    SAB TAB Ectopic Multiple Live Births         0 3     Review of Systems  Constitutional: Positive for fatigue and fever.  HENT: Positive for congestion, sneezing and sore throat. Negative for ear pain.   Respiratory: Positive for cough. Negative for wheezing.   Gastrointestinal:  Negative for abdominal pain, nausea and vomiting.  Musculoskeletal: Positive for myalgias.    Allergies  Other  Home Medications   Prior to Admission medications   Medication Sig Start Date End Date Taking? Authorizing Provider  amoxicillin (AMOXIL) 500 MG tablet Take 2 tablets (1,000 mg total) by mouth 3 (three) times daily. 01/26/17 01/31/17  Barry Dienes, NP  coconut oil OIL Apply 1 application topically as needed. Patient not taking: Reported on 07/05/2016 06/29/16   Morene Crocker, CNM  Dextromethorphan HBr 10 MG/5ML LIQD Take 5 mLs (10 mg total) by mouth every 4 (four) hours. 01/26/17 01/31/17  Barry Dienes, NP  ibuprofen (ADVIL,MOTRIN) 600 MG tablet Take 1 tablet (600 mg total) by mouth every 6 (six) hours. 06/29/16   Denney, Rachelle A, CNM  Iron-FA-B Cmp-C-Biot-Probiotic (FUSION PLUS) CAPS Take 1 tablet by mouth 2 (two) times daily. 06/29/16   Kandis Cocking A, CNM  omeprazole (PRILOSEC) 20 MG capsule Take 1 capsule (20 mg total) by mouth 2 (two) times daily before a meal. Patient not taking: Reported on 08/21/2016 02/14/16   Shelly Bombard, MD  polyethylene glycol [facility]) packet Take 17 g by mouth daily. 04/28/16   Seabron Spates, CNM  Prenatal Multivit-Min-Fe-FA (PRENATAL VITAMINS) 0.8 MG tablet Take 1 tablet by mouth daily. 04/28/16  Seabron Spates, CNM  senna-docusate (SENOKOT-S) 8.6-50 MG tablet Take 2 tablets by mouth 2 (two) times daily. 06/29/16   Morene Crocker, CNM  simethicone (MYLICON) 80 MG chewable tablet Chew 1 tablet (80 mg total) by mouth 4 (four) times daily as needed for flatulence. Patient not taking: Reported on 07/05/2016 06/29/16   Kandis Cocking A, CNM  Vitamin D, Cholecalciferol, 1000 units CAPS Take 1 tablet by mouth daily. Patient not taking: Reported on 08/21/2016 05/02/16   Constant, Peggy, MD  Vitamin D, Ergocalciferol, (DRISDOL) 50000 units CAPS capsule Take 1 capsule (50,000 Units total) by mouth every 7 (seven) days. 08/21/16   Morene Crocker, CNM   Meds Ordered and Administered this Visit  Medications - No data to display  BP 110/78 (BP Location: Right Arm)   Pulse 79   Temp 97.6 F (36.4 C) (Oral)   Resp 16   LMP 01/23/2017   SpO2 98%  No data found.   Physical Exam  Constitutional: She is oriented to person, place, and time. She appears well-developed and well-nourished.  HENT:  Head: Normocephalic and atraumatic.  Right Ear: External ear normal.  Left Ear: External ear normal.  Nose: Nose normal.  Mouth/Throat: Oropharynx is clear and moist. No oropharyngeal exudate.  Left TM is erythematous but no bulging or perforation.   Eyes: Conjunctivae are normal. Pupils are equal, round, and reactive to light.  Neck: Normal range of motion. Neck supple.  Cardiovascular: Normal rate, regular rhythm and normal heart sounds.   Pulmonary/Chest: Effort normal and breath sounds normal. She has no wheezes.  Abdominal: Soft. Bowel sounds are normal. There is no tenderness.  Lymphadenopathy:    She has no cervical adenopathy.  Neurological: She is alert and oriented to person, place, and time.  Skin: Skin is warm and dry. No rash noted.  Nursing note and vitals reviewed.   Urgent Care Course     Procedures (including critical care time)  Labs Review Labs Reviewed - No data to display  Imaging Review No results found.  MDM   1. Upper respiratory tract infection, unspecified type   2. Left ear pain    Physical examination unremarkable except for the left TM erythema. She does have left ear pain. Will treat empirically with Amoxicillin for otitis media. Believes her other symptoms are viral illness. Informed on rest, a lot of hydration. Do salt water gargle, throat lozenges, honey for sore throat. F/u with PCP for no improvement.     Barry Dienes, NP 01/26/17 2252

## 2017-04-24 ENCOUNTER — Encounter (HOSPITAL_COMMUNITY): Payer: Self-pay | Admitting: *Deleted

## 2017-04-24 ENCOUNTER — Emergency Department (HOSPITAL_COMMUNITY)
Admission: EM | Admit: 2017-04-24 | Discharge: 2017-04-24 | Disposition: A | Discharge status: Home / Self Care | Payer: Self-pay | Attending: Emergency Medicine | Admitting: Emergency Medicine

## 2017-04-24 ENCOUNTER — Emergency Department (HOSPITAL_COMMUNITY): Payer: Self-pay

## 2017-04-24 DIAGNOSIS — R0789 Other chest pain: Secondary | ICD-10-CM | POA: Insufficient documentation

## 2017-04-24 DIAGNOSIS — Z79899 Other long term (current) drug therapy: Secondary | ICD-10-CM | POA: Insufficient documentation

## 2017-04-24 LAB — I-STAT TROPONIN, ED
Troponin i, poc: 0 ng/mL (ref 0.00–0.08)
Troponin i, poc: 0 ng/mL (ref 0.00–0.08)

## 2017-04-24 LAB — CBC
HCT: 38.6 % (ref 36.0–46.0)
Hemoglobin: 13.4 g/dL (ref 12.0–15.0)
MCH: 30.2 pg (ref 26.0–34.0)
MCHC: 34.7 g/dL (ref 30.0–36.0)
MCV: 87.1 fL (ref 78.0–100.0)
PLATELETS: 289 10*3/uL (ref 150–400)
RBC: 4.43 MIL/uL (ref 3.87–5.11)
RDW: 13.1 % (ref 11.5–15.5)
WBC: 7.8 10*3/uL (ref 4.0–10.5)

## 2017-04-24 LAB — BASIC METABOLIC PANEL
Anion gap: 9 (ref 5–15)
BUN: 13 mg/dL (ref 6–20)
CO2: 23 mmol/L (ref 22–32)
CREATININE: 0.76 mg/dL (ref 0.44–1.00)
Calcium: 9 mg/dL (ref 8.9–10.3)
Chloride: 105 mmol/L (ref 101–111)
GFR calc Af Amer: >60 mL/min (ref >60)
GLUCOSE: 110 mg/dL — AB (ref 65–99)
POTASSIUM: 3.8 mmol/L (ref 3.5–5.1)
SODIUM: 137 mmol/L (ref 135–145)

## 2017-04-24 LAB — I-STAT BETA HCG BLOOD, ED (MC, WL, AP ONLY)

## 2017-04-24 MED ORDER — KETOROLAC TROMETHAMINE 30 MG/ML IJ SOLN
30.0000 mg | Freq: Once | INTRAMUSCULAR | Status: AC
  Administered 2017-04-24: 30 mg via INTRAMUSCULAR
  Filled 2017-04-24: qty 1

## 2017-04-24 MED ORDER — NAPROXEN 500 MG PO TABS: 500.0000 mg | ORAL_TABLET | Freq: Two times a day (BID) | ORAL | 0 refills | Status: DC | PRN

## 2017-04-24 MED ORDER — METHOCARBAMOL 500 MG PO TABS: 500.0000 mg | ORAL_TABLET | Freq: Two times a day (BID) | ORAL | 0 refills | Status: DC | PRN

## 2017-04-24 MED ORDER — METHOCARBAMOL 500 MG PO TABS
500.0000 mg | ORAL_TABLET | Freq: Once | ORAL | Status: AC
  Administered 2017-04-24: 500 mg via ORAL
  Filled 2017-04-24: qty 1

## 2017-04-24 NOTE — ED Triage Notes (Signed)
Pt c/o chest pain onset tonight, has been having pain to shoulder blades x 2 day. Also reports sob

## 2017-04-24 NOTE — Discharge Instructions (Signed)
It was my pleasure taking care of you today!   You were seen in the Emergency Department today for chest pain.  As we have discussed, today?s blood work and imagine are very reassuring.   Please call your primary care physician to schedule a follow up appointment to discuss your ER visit today.   Naproxen as needed for pain. Robaxin is your muscle relaxer to take as needed.   Return to the Emergency Department if you experience any worsening chest pain/pressure/tightness, difficulty breathing, sudden sweating, or other symptoms that concern you.

## 2017-04-24 NOTE — ED Provider Notes (Signed)
[facility] DEPT Provider Note   CSN: [id number] Arrival date & time: 04/24/17  0033     History   Chief Complaint Chief Complaint  Patient presents with  . Chest Pain    HPI Michaela Moran is a 36 y.o. female.  The history is provided by the patient and medical records.  Chest Pain   Associated symptoms include back pain and shortness of breath. Pertinent negatives include no palpitations.   Michaela Moran is a 36 y.o. female  with a PMH of HLD who presents to the Emergency Department complaining of diffuse bilateral back pain for 1-2 weeks worse with certain movements, better with rest. She got a massage last week which helped symptoms. Last night, she developed chest pain which is worse with movement and palpation. She does report shortness of breath which she states occurs due to the pain in her chest. No medications prior to arrival for symptoms. No abdominal pain, nausea, vomiting. No urinary symptoms. No fever or chills. No leg swelling or calf pain. No cough. No recent surgeries/immobilizations/trauma. No prior PE or DVT. No OCP's. No recent travel. No history of CAD, HTN or DM. + mother had heart disease.    Past Medical History:  Diagnosis Date  . Acid reflux   . Female circumcision   . Hemorrhoid   . 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 Hypercholesteremia     Patient Active Problem List   Diagnosis Date Noted  . Encounter for initial prescription of Nexplanon 08/21/2016  . S/P repeat low transverse C-section 06/27/2016  . 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 Previous cesarean delivery, antepartum condition or complication 14/97/0263  . Language barrier, cultural differences 04/28/2016  . Advanced maternal age in multigravida     Past Surgical History:  Procedure Laterality Date  . CESAREAN SECTION    . CESAREAN SECTION N/A 01/02/2013   Procedure: CESAREAN SECTION;  Surgeon: Donnamae Jude, MD;  Location: [facility];  Service: Obstetrics;  Laterality: N/A;  Incidental Cyystotomy  . CESAREAN SECTION N/A 06/26/2016   Procedure: CESAREAN SECTION;  Surgeon: Aletha Halim, MD;  Location: [facility];  Service: Obstetrics;  Laterality: N/A;  . CYSTOTOMY     with repair at 2014 repeat c-section    OB History    Gravida Para Term Preterm AB Living   3 3 3     3    SAB TAB Ectopic Multiple Live Births         0 3       Home Medications    Prior to Admission medications   Medication Sig Start Date End Date Taking? Authorizing Provider  coconut oil OIL Apply 1 application topically as needed. Patient not taking: Reported on 07/05/2016 06/29/16   Kandis Cocking A, CNM  ibuprofen (ADVIL,MOTRIN) 600 MG tablet Take 1 tablet (600 mg total) by mouth every 6 (six) hours. 06/29/16   Denney, Rachelle A, CNM  Iron-FA-B Cmp-C-Biot-Probiotic (FUSION PLUS) CAPS Take 1 tablet by mouth 2 (two) times daily. 06/29/16   Kandis Cocking A, CNM  methocarbamol (ROBAXIN) 500 MG tablet Take 1 tablet (500 mg total) by mouth 2 (two) times daily as needed for muscle spasms. 04/24/17   Ward, Ozella Almond, PA-C  naproxen (NAPROSYN) 500 MG tablet Take 1 tablet (500 mg total) by mouth 2 (two) times daily as needed for mild pain or moderate pain. 04/24/17   Ward, Ozella Almond, PA-C  omeprazole ([place])  20 MG capsule Take 1 capsule (20 mg total) by mouth 2 (two) times daily before a meal. Patient not taking: Reported on 08/21/2016 02/14/16   Shelly Bombard, MD  polyethylene glycol [facility]) packet Take 17 g by mouth daily. 04/28/16   Seabron Spates, CNM  Prenatal Multivit-Min-Fe-FA (PRENATAL VITAMINS) 0.8 MG tablet Take 1 tablet by mouth daily. 04/28/16   Seabron Spates, CNM  senna-docusate (SENOKOT-S) 8.6-50 MG tablet Take 2 tablets by mouth 2 (two) times daily. 06/29/16   Morene Crocker, CNM  simethicone (MYLICON) 80 MG chewable tablet Chew 1 tablet (80 mg total) by mouth 4 (four) times daily as needed for  flatulence. Patient not taking: Reported on 07/05/2016 06/29/16   Kandis Cocking A, CNM  Vitamin D, Cholecalciferol, 1000 units CAPS Take 1 tablet by mouth daily. Patient not taking: Reported on 08/21/2016 05/02/16   Constant, Peggy, MD  Vitamin D, Ergocalciferol, (DRISDOL) 50000 units CAPS capsule Take 1 capsule (50,000 Units total) by mouth every 7 (seven) days. 08/21/16   Morene Crocker, CNM    Family History Family History  Problem Relation Age of Onset  . Diabetes Mother   . Diabetes Sister   . Diabetes Brother   . Hypertension Brother   . Stroke Maternal Aunt   . Other Neg Hx     Social History Social History  Substance Use Topics  . Smoking status: Never Smoker  . Smokeless tobacco: Never Used  . Alcohol use No     Allergies   Other   Review of Systems Review of Systems  Respiratory: Positive for shortness of breath.   Cardiovascular: Positive for chest pain. Negative for palpitations and leg swelling.  Musculoskeletal: Positive for back pain.  All other systems reviewed and are negative.    Physical Exam Updated Vital Signs BP 100/62   Pulse (!) 54   Temp 98.5 F (36.9 C) (Oral)   Resp 18   LMP 03/31/2017   SpO2 100%   Physical Exam  Constitutional: She is oriented to person, place, and time. She appears well-developed and well-nourished. No distress.  HENT:  Head: Normocephalic and atraumatic.  Neck: No JVD present.  Cardiovascular: Normal rate, regular rhythm and normal heart sounds.   No murmur heard. Pulmonary/Chest: Effort normal and breath sounds normal. No respiratory distress. She has no wheezes. She has no rales. She exhibits tenderness.  Abdominal: Soft. She exhibits no distension. There is no tenderness.  Musculoskeletal: She exhibits no edema.  No midline C/T/L spine tenderness. Diffuse tenderness to T/L paraspinal musculature. Full ROM. 5/5 muscle strength in all four extremities. SLR negative.  Neurological: She is alert and  oriented to person, place, and time.  Bilateral lower extremities neurovascularly intact.  Skin: Skin is warm and dry.  Nursing note and vitals reviewed.    ED Treatments / Results  Labs (all labs ordered are listed, but only abnormal results are displayed) Labs Reviewed  BASIC METABOLIC PANEL - Abnormal; Notable for the following:       Result Value   Glucose, Bld 110 (*)    All other components within normal limits  CBC  I-STAT TROPONIN, ED  I-STAT BETA HCG BLOOD, ED ([facility], [facility], [facility] ONLY)  I-STAT TROPONIN, ED    EKG  EKG Interpretation  Date/Time:  Wednesday April 24 2017 00:36:16 EDT Ventricular Rate:  73 PR Interval:  150 QRS Duration: 78 QT Interval:  400 QTC Calculation: 440 R Axis:   64 Text Interpretation:  Normal sinus  rhythm Nonspecific T wave abnormality Abnormal ECG No significant change since last tracing Confirmed by Alfonzo Beers [phone]) on 04/24/2017 8:18:04 AM       Radiology Dg Chest 2 View  Result Date: 04/24/2017 CLINICAL DATA:  Acute onset of mid chest pain and shortness of breath. Initial encounter. EXAM: CHEST  2 VIEW COMPARISON:  Chest radiograph performed 07/26/2014 FINDINGS: The lungs are well-aerated and clear. There is no evidence of focal opacification, pleural effusion or pneumothorax. The heart is normal in size; the mediastinal contour is within normal limits. No acute osseous abnormalities are seen. IMPRESSION: No acute cardiopulmonary process seen. Electronically Signed   By: Garald Balding M.D.   On: 04/24/2017 01:19    Procedures Procedures (including critical care time)  Medications Ordered in ED Medications  ketorolac (TORADOL) 30 MG/ML injection 30 mg (30 mg Intramuscular Given 04/24/17 0927)  methocarbamol (ROBAXIN) tablet 500 mg (500 mg Oral Given 04/24/17 3976)     Initial Impression / Assessment and Plan / ED Course  I have reviewed the triage vital signs and the nursing notes.  Pertinent labs & imaging results that were  available during my care of the patient were reviewed by me and considered in my medical decision making (see chart for details).    Khari Dais is a 36 y.o. female who presents to ED for chest pain associated with shortness of breath. On exam, patient is afebrile, hemodynamically stable. No hypoxia or tachycardia. Chest wall is very tender to palpation.   Labs reviewed and reassuring with negative troponin x2.  CXR with no acute abnormalities.  EKG unchanged from previous.   Unlikely cardiopulmonary etiology. Heart score of 2. PERC negative. Given toradol and robaxin with relief in symptoms.    Labs and imaging reviewed again prior to discharge. Patient has been advised to return to the ED if development of any exertional chest pain, trouble breathing, new/worsening symptoms or for any additional concerns. Evaluation does not show pathology that would require ongoing emergent intervention or inpatient treatment. Encouraged to follow up with PCP. Patient understands return precautions and follow up plan. All questions answered.   Patient discussed with Dr. Rogene Houston who agrees with treatment plan.    Final Clinical Impressions(s) / ED Diagnoses   Final diagnoses:  Chest wall pain    New Prescriptions Discharge Medication List as of 04/24/2017 10:05 AM    START taking these medications   Details  methocarbamol (ROBAXIN) 500 MG tablet Take 1 tablet (500 mg total) by mouth 2 (two) times daily as needed for muscle spasms., Starting Wed 04/24/2017, Print    naproxen (NAPROSYN) 500 MG tablet Take 1 tablet (500 mg total) by mouth 2 (two) times daily as needed for mild pain or moderate pain., Starting Wed 04/24/2017, Print         Ward, Ozella Almond, PA-C 04/24/17 1023    Fredia Sorrow, MD 04/29/17 [id number]

## 2017-10-26 ENCOUNTER — Encounter (HOSPITAL_COMMUNITY): Payer: Self-pay

## 2017-10-26 ENCOUNTER — Emergency Department (HOSPITAL_COMMUNITY)
Admission: EM | Admit: 2017-10-26 | Discharge: 2017-10-26 | Disposition: A | Discharge status: Home / Self Care | Payer: Self-pay | Attending: Emergency Medicine | Admitting: Emergency Medicine

## 2017-10-26 ENCOUNTER — Emergency Department (HOSPITAL_COMMUNITY): Payer: Self-pay

## 2017-10-26 DIAGNOSIS — R1084 Generalized abdominal pain: Secondary | ICD-10-CM

## 2017-10-26 DIAGNOSIS — R110 Nausea: Secondary | ICD-10-CM

## 2017-10-26 DIAGNOSIS — N939 Abnormal uterine and vaginal bleeding, unspecified: Secondary | ICD-10-CM | POA: Insufficient documentation

## 2017-10-26 LAB — COMPREHENSIVE METABOLIC PANEL
ALT: 13 U/L — ABNORMAL LOW (ref 14–54)
ANION GAP: 10 (ref 5–15)
AST: 19 U/L (ref 15–41)
Albumin: 3.7 g/dL (ref 3.5–5.0)
Alkaline Phosphatase: 95 U/L (ref 38–126)
BILIRUBIN TOTAL: 0.5 mg/dL (ref 0.3–1.2)
BUN: 11 mg/dL (ref 6–20)
CALCIUM: 9 mg/dL (ref 8.9–10.3)
CO2: 24 mmol/L (ref 22–32)
Chloride: 106 mmol/L (ref 101–111)
Creatinine, Ser: 0.73 mg/dL (ref 0.44–1.00)
Glucose, Bld: 102 mg/dL — ABNORMAL HIGH (ref 65–99)
Potassium: 3.8 mmol/L (ref 3.5–5.1)
Sodium: 140 mmol/L (ref 135–145)
TOTAL PROTEIN: 6.9 g/dL (ref 6.5–8.1)

## 2017-10-26 LAB — WET PREP, GENITAL
CLUE CELLS WET PREP: NONE SEEN
Sperm: NONE SEEN
TRICH WET PREP: NONE SEEN
WBC, Wet Prep HPF POC: NONE SEEN
YEAST WET PREP: NONE SEEN

## 2017-10-26 LAB — URINALYSIS, ROUTINE W REFLEX MICROSCOPIC
BILIRUBIN URINE: NEGATIVE
GLUCOSE, UA: NEGATIVE mg/dL
Ketones, ur: NEGATIVE mg/dL
LEUKOCYTES UA: NEGATIVE
NITRITE: NEGATIVE
PROTEIN: NEGATIVE mg/dL
Specific Gravity, Urine: 1.016 (ref 1.005–1.030)
pH: 7 (ref 5.0–8.0)

## 2017-10-26 LAB — CBC
HEMATOCRIT: 40.7 % (ref 36.0–46.0)
HEMOGLOBIN: 13.9 g/dL (ref 12.0–15.0)
MCH: 31 pg (ref 26.0–34.0)
MCHC: 34.2 g/dL (ref 30.0–36.0)
MCV: 90.8 fL (ref 78.0–100.0)
Platelets: 312 10*3/uL (ref 150–400)
RBC: 4.48 MIL/uL (ref 3.87–5.11)
RDW: 13.3 % (ref 11.5–15.5)
WBC: 6.4 10*3/uL (ref 4.0–10.5)

## 2017-10-26 LAB — TYPE AND SCREEN
ABO/RH(D): AB POS
Antibody Screen: NEGATIVE

## 2017-10-26 LAB — I-STAT BETA HCG BLOOD, ED (MC, WL, AP ONLY)

## 2017-10-26 LAB — LIPASE, BLOOD: Lipase: 45 U/L (ref 11–51)

## 2017-10-26 LAB — ABO/RH: ABO/RH(D): AB POS

## 2017-10-26 MED ORDER — POLYETHYLENE GLYCOL 3350 17 G PO PACK: 17.0000 g | PACK | Freq: Every day | ORAL | 0 refills | Status: DC

## 2017-10-26 MED ORDER — CALCIUM CARBONATE ANTACID 500 MG PO CHEW: 1.0000 | CHEWABLE_TABLET | Freq: Three times a day (TID) | ORAL | 0 refills | Status: DC | PRN

## 2017-10-26 MED ORDER — ONDANSETRON 4 MG PO TBDP: 4.0000 mg | ORAL_TABLET | Freq: Three times a day (TID) | ORAL | 0 refills | Status: DC | PRN

## 2017-10-26 MED ORDER — GI COCKTAIL ~~LOC~~
30.0000 mL | Freq: Once | ORAL | Status: AC
  Administered 2017-10-26: 30 mL via ORAL
  Filled 2017-10-26: qty 30

## 2017-10-26 MED ORDER — OMEPRAZOLE 20 MG PO CPDR: 20.0000 mg | DELAYED_RELEASE_CAPSULE | Freq: Every day | ORAL | 0 refills | Status: DC

## 2017-10-26 NOTE — ED Notes (Signed)
Pt states EDP did not go over results of xray; waiting on EDP so that this RN can discharge pt.

## 2017-10-26 NOTE — Discharge Instructions (Signed)
As discussed, follow the food choices provided on this discharge summary to help with reflux.  Avoid spicy food, alcohol, caffeine. Take omeprazole daily and follow up with Gastroenterology. Take Zofran as needed for nausea vomiting. Take Tylenol as needed for abdominal pain. Follow up with GYN regarding irregular menstrual bleeding.

## 2017-10-26 NOTE — ED Triage Notes (Signed)
Pt presents with 6 week h/o vaginal bleeding with abdominal pain.  Pt reports nausea and vomiting that began last night.

## 2017-10-26 NOTE — ED Notes (Signed)
Portable xray at bedside.

## 2017-10-26 NOTE — ED Provider Notes (Signed)
[facility] EMERGENCY DEPARTMENT Provider Note   CSN: [id number] Arrival date & time: 10/26/17  1326     History   Chief Complaint Chief Complaint  Patient presents with  . Abdominal Pain    HPI Michaela Moran is a 37 y.o. female G3 P3003 all by C-section with past medical history significant for GERD not currently on medications presenting with 6 weeks of vaginal bleeding and abdominal pain.  She also reports postprandial belching and left upper quadrant pain which is the same as when she was previously diagnosed with GERD.  She states that she saw gastroenterologist's with a stomach bacteria treated for 10 days and improved.  Last bowel movement was today and normal. She reports that she has a Nexplanon inserted in 2017 has been irregular since but has not had bleeding consecutively for this long.  She reports generalized abdominal discomfort/cramping. She reports being currently sexually active without barrier protection and has been experiencing vaginal pruritus.  She denies vaginal discharge, fever, chills or other symptoms.  Patient does not have a GYN or PCP.  HPI  Past Medical History:  Diagnosis Date  . Acid reflux   . Female circumcision   . Hemorrhoid   . 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 Hypercholesteremia     Patient Active Problem List   Diagnosis Date Noted  . Encounter for initial prescription of Nexplanon 08/21/2016  . S/P repeat low transverse C-section 06/27/2016  . 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 Previous cesarean delivery, antepartum condition or complication 50/05/3817  . Language barrier, cultural differences 04/28/2016  . Advanced maternal age in multigravida     Past Surgical History:  Procedure Laterality Date  . CESAREAN SECTION    . CESAREAN SECTION N/A 01/02/2013   Procedure: CESAREAN SECTION;  Surgeon: Donnamae Jude, MD;  Location: [facility];  Service: Obstetrics;   Laterality: N/A;  Incidental Cyystotomy  . CESAREAN SECTION N/A 06/26/2016   Procedure: CESAREAN SECTION;  Surgeon: Aletha Halim, MD;  Location: [facility];  Service: Obstetrics;  Laterality: N/A;  . CYSTOTOMY     with repair at 2014 repeat c-section    OB History    Gravida Para Term Preterm AB Living   3 3 3     3    SAB TAB Ectopic Multiple Live Births         0 3       Home Medications    Prior to Admission medications   Medication Sig Start Date End Date Taking? Authorizing Provider  acetaminophen (TYLENOL) 325 MG tablet Take 650 mg by mouth every 6 (six) hours as needed for headache (pain).   Yes [provider]  etonogestrel (NEXPLANON) 68 MG IMPL implant 1 each by Subdermal route once. Implanted October 2017   Yes [provider]  calcium carbonate (TUMS) 500 MG chewable tablet Chew 1 tablet (200 mg of elemental calcium total) by mouth 3 (three) times daily with meals as needed for indigestion or heartburn. 10/26/17   Avie Echevaria B, PA-C  coconut oil OIL Apply 1 application topically as needed. Patient not taking: Reported on 07/05/2016 06/29/16   Kandis Cocking A, CNM  Iron-FA-B Cmp-C-Biot-Probiotic (FUSION PLUS) CAPS Take 1 tablet by mouth 2 (two) times daily. Patient not taking: Reported on 10/26/2017 06/29/16   Kandis Cocking A, CNM  methocarbamol (ROBAXIN) 500 MG tablet Take 1 tablet (500 mg total) by mouth 2 (two)  times daily as needed for muscle spasms. Patient not taking: Reported on 10/26/2017 04/24/17   Ward, Ozella Almond, PA-C  omeprazole (PRILOSEC) 20 MG capsule Take 1 capsule (20 mg total) by mouth daily. 10/26/17   Avie Echevaria B, PA-C  ondansetron (ZOFRAN ODT) 4 MG disintegrating tablet Take 1 tablet (4 mg total) by mouth every 8 (eight) hours as needed for nausea or vomiting. 10/26/17   Avie Echevaria B, PA-C  polyethylene glycol [facility]) packet Take 17 g by mouth daily. 10/26/17   Emeline General, PA-C  Prenatal  Multivit-Min-Fe-FA (PRENATAL VITAMINS) 0.8 MG tablet Take 1 tablet by mouth daily. Patient not taking: Reported on 10/26/2017 04/28/16   Seabron Spates, CNM  senna-docusate (SENOKOT-S) 8.6-50 MG tablet Take 2 tablets by mouth 2 (two) times daily. Patient not taking: Reported on 10/26/2017 06/29/16   Morene Crocker, CNM  simethicone (MYLICON) 80 MG chewable tablet Chew 1 tablet (80 mg total) by mouth 4 (four) times daily as needed for flatulence. Patient not taking: Reported on 07/05/2016 06/29/16   Kandis Cocking A, CNM  Vitamin D, Cholecalciferol, 1000 units CAPS Take 1 tablet by mouth daily. Patient not taking: Reported on 08/21/2016 05/02/16   Constant, Peggy, MD  Vitamin D, Ergocalciferol, (DRISDOL) 50000 units CAPS capsule Take 1 capsule (50,000 Units total) by mouth every 7 (seven) days. Patient not taking: Reported on 10/26/2017 08/21/16   Kandis Cocking A, CNM  ferrous sulfate (FERROUSUL) 325 (65 FE) MG tablet Take 1 tablet (325 mg total) by mouth 2 (two) times daily. 01/05/13 04/13/13  Martha Clan, MD    Family History Family History  Problem Relation Age of Onset  . Diabetes Mother   . Diabetes Sister   . Diabetes Brother   . Hypertension Brother   . Stroke Maternal Aunt   . Other Neg Hx     Social History Social History   Tobacco Use  . Smoking status: Never Smoker  . Smokeless tobacco: Never Used  Substance Use Topics  . Alcohol use: No  . Drug use: No     Allergies   Other   Review of Systems Review of Systems  Constitutional: Positive for fatigue. Negative for chills, diaphoresis and fever.  Respiratory: Negative for cough, choking, chest tightness, shortness of breath, wheezing and stridor.   Cardiovascular: Negative for chest pain, palpitations and leg swelling.  Gastrointestinal: Positive for abdominal pain. Negative for abdominal distention, blood in stool, diarrhea, nausea and vomiting.       Generalized abdominal discomfort.  Left upper quadrant more  focal pain after meals.  Genitourinary: Positive for pelvic pain and vaginal bleeding. Negative for decreased urine volume, difficulty urinating, dyspareunia, dysuria, flank pain, frequency, genital sores, hematuria, vaginal discharge and vaginal pain.  Musculoskeletal: Negative for arthralgias, back pain, gait problem, joint swelling, myalgias, neck pain and neck stiffness.  Skin: Negative for color change, pallor and rash.  Neurological: Negative for dizziness, weakness, light-headedness and headaches.  Psychiatric/Behavioral: Negative for behavioral problems.     Physical Exam Updated Vital Signs BP 108/76   Pulse 67   Temp 98.1 F (36.7 C) (Oral)   Resp 16   SpO2 100%   Physical Exam  Constitutional: She appears well-developed and well-nourished.  Non-toxic appearance. She does not appear ill. No distress.  Afebrile, well-appearing, nontoxic sitting comfortably in bed no acute distress.  HENT:  Head: Normocephalic and atraumatic.  Eyes: Conjunctivae are normal. No scleral icterus.  Neck: Neck supple.  Cardiovascular: Normal rate, regular rhythm and  normal heart sounds.  No murmur heard. Pulmonary/Chest: Effort normal and breath sounds normal. No stridor. No respiratory distress. She has no wheezes. She has no rhonchi. She has no rales.  Abdominal: Soft. Bowel sounds are normal. She exhibits distension. She exhibits no ascites. There is no tenderness. There is no rigidity, no rebound, no guarding and no CVA tenderness.  Appears mildly distended but soft and nontender.   Genitourinary: Uterus is not tender. Cervix exhibits no motion tenderness, no discharge and no friability. Right adnexum displays no mass and no tenderness. Left adnexum displays no mass and no tenderness. There is bleeding in the vagina. No tenderness in the vagina. No foreign body in the vagina. No vaginal discharge found.  Genitourinary Comments: Normal cervix parous, blood in the vaginal vault.  No discharge or  cervical motion tenderness.  No adnexal tenderness or mass appreciated.  Musculoskeletal: She exhibits no edema.  Neurological: She is alert.  Skin: Skin is warm and dry. No rash noted. She is not diaphoretic. No cyanosis or erythema. No pallor.  Psychiatric: She has a normal mood and affect.  Nursing note and vitals reviewed.    ED Treatments / Results  Labs (all labs ordered are listed, but only abnormal results are displayed) Labs Reviewed  COMPREHENSIVE METABOLIC PANEL - Abnormal; Notable for the following components:      Result Value   Glucose, Bld 102 (*)    ALT 13 (*)    All other components within normal limits  URINALYSIS, ROUTINE W REFLEX MICROSCOPIC - Abnormal; Notable for the following components:   Hgb urine dipstick MODERATE (*)    Bacteria, UA RARE (*)    Squamous Epithelial / LPF 0-5 (*)    All other components within normal limits  WET PREP, GENITAL  LIPASE, BLOOD  CBC  RPR  HIV ANTIBODY (ROUTINE TESTING)  I-STAT BETA HCG BLOOD, ED ([facility], [facility], [facility] ONLY)  TYPE AND SCREEN  ABO/RH  GC/CHLAMYDIA PROBE AMP ([facility]) NOT AT [facility]    EKG  EKG Interpretation None       Radiology Dg Abdomen 1 View  Result Date: 10/26/2017 CLINICAL DATA:  Abdominal pain and distension EXAM: ABDOMEN - 1 VIEW COMPARISON:  None. FINDINGS: There is stool throughout much of the colon. There is no bowel dilatation or air-fluid level to suggest bowel obstruction. No free air. There is an apparent bone island in the supra-acetabular region on the right. There are apparent phleboliths in the pelvis. IMPRESSION: Stool throughout much of colon.  No bowel obstruction or free air. Electronically Signed   By: Lowella Grip III M.D.   On: 10/26/2017 19:37    Procedures Procedures (including critical care time)  Medications Ordered in ED Medications  gi cocktail (Maalox,Lidocaine,Donnatal) (30 mLs Oral Given 10/26/17 1747)     Initial Impression / Assessment and Plan / ED Course  I  have reviewed the triage vital signs and the nursing notes.  Pertinent labs & imaging results that were available during my care of the patient were reviewed by me and considered in my medical decision making (see chart for details).    Patient presenting with 6 weeks of vaginal bleeding and abdominal discomfort including left upper quadrant postprandial pain.  NO tenderness to palpation of the abdomen.  Abdomen is soft and non-peritoneal.  Patient is well-appearing, nontoxic afebrile. Patient is belching during my exam. Labs are unremarkable.  No focal adnexal pain. Given GI cocktail with some improvement.  Pelvic exam without cervical motion tenderness  or discharge.  Small amount of blood in the vaginal vault  Will discharge home with symptomatic relief and close follow-up with GYN and PCP as well as gastroenterology. Discussed return precautions patient understood and agreed with discharge plan.  Final Clinical Impressions(s) / ED Diagnoses   Final diagnoses:  Generalized abdominal pain  Vaginal bleeding  Nausea    ED Discharge Orders        Ordered    polyethylene glycol (MIRALAX) packet  Daily     10/26/17 2018    omeprazole (PRILOSEC) 20 MG capsule  Daily     10/26/17 2018    calcium carbonate (TUMS) 500 MG chewable tablet  3 times daily with meals PRN     10/26/17 2018    ondansetron (ZOFRAN ODT) 4 MG disintegrating tablet  Every 8 hours PRN     10/26/17 2018       Emeline General, PA-C 10/26/17 2020    Quintella Reichert, MD 10/27/17 [phone]

## 2017-10-27 LAB — HIV ANTIBODY (ROUTINE TESTING W REFLEX): HIV Screen 4th Generation wRfx: NONREACTIVE

## 2017-10-27 LAB — RPR: RPR Ser Ql: NONREACTIVE

## 2017-10-28 LAB — GC/CHLAMYDIA PROBE AMP (~~LOC~~) NOT AT ARMC
Chlamydia: NEGATIVE
Neisseria Gonorrhea: NEGATIVE

## 2017-11-07 ENCOUNTER — Encounter (HOSPITAL_COMMUNITY): Payer: Self-pay | Admitting: Emergency Medicine

## 2017-11-07 ENCOUNTER — Ambulatory Visit (HOSPITAL_COMMUNITY)
Admission: EM | Admit: 2017-11-07 | Discharge: 2017-11-07 | Disposition: A | Discharge status: Home / Self Care | Payer: Self-pay | Attending: Family Medicine | Admitting: Family Medicine

## 2017-11-07 ENCOUNTER — Other Ambulatory Visit: Payer: Self-pay

## 2017-11-07 DIAGNOSIS — J069 Acute upper respiratory infection, unspecified: Secondary | ICD-10-CM

## 2017-11-07 MED ORDER — OSELTAMIVIR PHOSPHATE 75 MG PO CAPS: 75.0000 mg | ORAL_CAPSULE | Freq: Two times a day (BID) | ORAL | 0 refills | Status: DC

## 2017-11-07 MED ORDER — HYDROCODONE-HOMATROPINE 5-1.5 MG/5ML PO SYRP: 5.0000 mL | ORAL_SOLUTION | Freq: Four times a day (QID) | ORAL | 0 refills | Status: DC | PRN

## 2017-11-07 NOTE — ED Triage Notes (Signed)
Pt reports chest congestion, nasal congestion, cough and headache for four days.

## 2017-11-07 NOTE — ED Provider Notes (Signed)
[facility]   [id number] 11/07/17 Arrival Time: 3716   SUBJECTIVE:  Michaela Moran is a 37 y.o. female who presents to the urgent care with complaint of chest congestion, nasal congestion, cough and headache for four days.  Flu going around at work.  No h/o asthma    Past Medical History:  Diagnosis Date  . Acid reflux   . Female circumcision   . Hemorrhoid   . 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 Hypercholesteremia    Family History  Problem Relation Age of Onset  . Diabetes Mother   . Diabetes Sister   . Diabetes Brother   . Hypertension Brother   . Stroke Maternal Aunt   . Other Neg Hx    Social History   Socioeconomic History  . Marital status: Married    Spouse name: Not on file  . Number of children: Not on file  . Years of education: Not on file  . Highest education level: Not on file  Social Needs  . Financial resource strain: Not on file  . Food insecurity - worry: Not on file  . Food insecurity - inability: Not on file  . Transportation needs - medical: Not on file  . Transportation needs - non-medical: Not on file  Occupational History  . Not on file  Tobacco Use  . Smoking status: Never Smoker  . Smokeless tobacco: Never Used  Substance and Sexual Activity  . Alcohol use: No  . Drug use: No  . Sexual activity: Yes    Birth control/protection: None  Other Topics Concern  . Not on file  Social History Narrative  . Not on file   No outpatient medications have been marked as taking for the 11/07/17 encounter [facility] Encounter).   Allergies  Allergen Reactions  . Other Swelling    Allergic to peas.      ROS: As per HPI, remainder of ROS negative.   OBJECTIVE:   Vitals:   11/07/17 1446  BP: 104/78  Pulse: 89  Temp: 98 F (36.7 C)  TempSrc: Oral  SpO2: 100%     General appearance: alert; no distress Eyes: PERRL; EOMI; conjunctiva normal HENT: normocephalic; atraumatic; TMs normal, canal normal, external ears normal  without trauma; nasal mucosa normal; oral mucosa normal Neck: supple Lungs: clear to auscultation bilaterally Heart: regular rate and rhythm Back: no CVA tenderness Extremities: no cyanosis or edema; symmetrical with no gross deformities Skin: warm and dry Neurologic: normal gait; grossly normal Psychological: alert and cooperative; normal mood and affect      Labs:  Results for orders placed or performed during the hospital encounter of 10/26/17  Wet prep, genital  Result Value Ref Range   Yeast Wet Prep HPF POC NONE SEEN NONE SEEN   Trich, Wet Prep NONE SEEN NONE SEEN   Clue Cells Wet Prep HPF POC NONE SEEN NONE SEEN   WBC, Wet Prep HPF POC NONE SEEN NONE SEEN   Sperm NONE SEEN   Lipase, blood  Result Value Ref Range   Lipase 45 11 - 51 U/L  Comprehensive metabolic panel  Result Value Ref Range   Sodium 140 135 - 145 mmol/L   Potassium 3.8 3.5 - 5.1 mmol/L   Chloride 106 101 - 111 mmol/L   CO2 24 22 - 32 mmol/L   Glucose, Bld 102 (H) 65 - 99 mg/dL   BUN 11 6 - 20 mg/dL   Creatinine, Ser 0.73 0.44 - 1.00 mg/dL  Calcium 9.0 8.9 - 10.3 mg/dL   Total Protein 6.9 6.5 - 8.1 g/dL   Albumin 3.7 3.5 - 5.0 g/dL   AST 19 15 - 41 U/L   ALT 13 (L) 14 - 54 U/L   Alkaline Phosphatase 95 38 - 126 U/L   Total Bilirubin 0.5 0.3 - 1.2 mg/dL   GFR calc non Af Amer >60 >60 mL/min   GFR calc Af Amer >60 >60 mL/min   Anion gap 10 5 - 15  CBC  Result Value Ref Range   WBC 6.4 4.0 - 10.5 K/uL   RBC 4.48 3.87 - 5.11 MIL/uL   Hemoglobin 13.9 12.0 - 15.0 g/dL   HCT 40.7 36.0 - 46.0 %   MCV 90.8 78.0 - 100.0 fL   MCH 31.0 26.0 - 34.0 pg   MCHC 34.2 30.0 - 36.0 g/dL   RDW 13.3 11.5 - 15.5 %   Platelets 312 150 - 400 K/uL  Urinalysis, Routine w reflex microscopic  Result Value Ref Range   Color, Urine YELLOW YELLOW   APPearance CLEAR CLEAR   Specific Gravity, Urine 1.016 1.005 - 1.030   pH 7.0 5.0 - 8.0   Glucose, UA NEGATIVE NEGATIVE mg/dL   Hgb urine dipstick MODERATE (A)  NEGATIVE   Bilirubin Urine NEGATIVE NEGATIVE   Ketones, ur NEGATIVE NEGATIVE mg/dL   Protein, ur NEGATIVE NEGATIVE mg/dL   Nitrite NEGATIVE NEGATIVE   Leukocytes, UA NEGATIVE NEGATIVE   RBC / HPF 6-30 0 - 5 RBC/hpf   WBC, UA 0-5 0 - 5 WBC/hpf   Bacteria, UA RARE (A) NONE SEEN   Squamous Epithelial / LPF 0-5 (A) NONE SEEN   Mucus PRESENT   RPR  Result Value Ref Range   RPR Ser Ql Non Reactive Non Reactive  HIV antibody  Result Value Ref Range   HIV Screen 4th Generation wRfx Non Reactive Non Reactive  I-Stat beta hCG blood, ED  Result Value Ref Range   I-stat hCG, quantitative <5.0 <5 mIU/mL   Comment 3          Type and screen [place]  Result Value Ref Range   ABO/RH(D) AB POS    Antibody Screen NEG    Sample Expiration      10/29/2017 Performed at [facility], [address] [place], [place] [address]   ABO/Rh  Result Value Ref Range   ABO/RH(D)      AB POS Performed at [facility] [address] [place], [place] [address]   GC/Chlamydia probe amp  Result Value Ref Range   Chlamydia Negative    Neisseria gonorrhea Negative     Labs Reviewed - No data to display  No results found.     ASSESSMENT & PLAN:  1. Upper respiratory tract infection, unspecified type     Meds ordered this encounter  Medications  . oseltamivir (TAMIFLU) 75 MG capsule    Sig: Take 1 capsule (75 mg total) by mouth every 12 (twelve) hours.    Dispense:  10 capsule    Refill:  0  . HYDROcodone-homatropine (HYDROMET) 5-1.5 MG/5ML syrup    Sig: Take 5 mLs by mouth every 6 (six) hours as needed for cough.    Dispense:  60 mL    Refill:  0    Reviewed expectations re: course of current medical issues. Questions answered. Outlined signs and symptoms indicating need for more acute intervention. Patient verbalized understanding. After Visit Summary given.  Procedures:      Robyn Haber, MD 11/07/17 [phone]

## 2018-03-02 ENCOUNTER — Ambulatory Visit (HOSPITAL_COMMUNITY)
Admission: EM | Admit: 2018-03-02 | Discharge: 2018-03-02 | Disposition: A | Discharge status: Home / Self Care | Payer: Medicaid Other | Attending: Urgent Care | Admitting: Urgent Care

## 2018-03-02 ENCOUNTER — Encounter (HOSPITAL_COMMUNITY): Payer: Self-pay | Admitting: Emergency Medicine

## 2018-03-02 DIAGNOSIS — R519 Headache, unspecified: Secondary | ICD-10-CM

## 2018-03-02 DIAGNOSIS — G44009 Cluster headache syndrome, unspecified, not intractable: Secondary | ICD-10-CM

## 2018-03-02 DIAGNOSIS — M25561 Pain in right knee: Secondary | ICD-10-CM

## 2018-03-02 DIAGNOSIS — R51 Headache: Secondary | ICD-10-CM

## 2018-03-02 MED ORDER — KETOROLAC TROMETHAMINE 60 MG/2ML IM SOLN
INTRAMUSCULAR | Status: AC
  Filled 2018-03-02: qty 2

## 2018-03-02 MED ORDER — KETOROLAC TROMETHAMINE 60 MG/2ML IM SOLN
60.0000 mg | Freq: Once | INTRAMUSCULAR | Status: AC
  Administered 2018-03-02: 60 mg via INTRAMUSCULAR

## 2018-03-02 MED ORDER — MELOXICAM 7.5 MG PO TABS: 7.5000 mg | ORAL_TABLET | Freq: Every day | ORAL | 0 refills | Status: DC

## 2018-03-02 NOTE — Discharge Instructions (Addendum)
Hydrate well with at least 2 liters (1 gallon or 64 ounces) of water daily.

## 2018-03-02 NOTE — ED Provider Notes (Signed)
  MRN: [id number] DOB: Mar 23, 1981  Subjective:   Michaela Moran is a 37 y.o. female presenting for 2 week history of worsening right knee pain.  Also reports having a right-sided frontotemporal headache today.  Has had general aching of her neck and right back.  But denies this kind of pain today.  She reports that she works very long hours, has to stand for an entire shift which may last anywhere from 10 to 12 hours.  Reports that she was working 7 days/week.  In the past couple weeks however she has been working 4 to 5 days/week.  Denies fall, trauma, confusion, dizziness, photosensitivity, scotoma, numbness, tingling, weakness, redness, swelling, warmth of her knee.  She has tried ibuprofen and Tylenol intermittently.  Denies smoking cigarettes.  Patient is not breast-feeding.  Uses Nexplanon. No other medications.    Allergies  Allergen Reactions  . Other Swelling    Allergic to peas.    Past Medical History:  Diagnosis Date  . Acid reflux   . Female circumcision   . Hemorrhoid   . 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 Hypercholesteremia      Past Surgical History:  Procedure Laterality Date  . CESAREAN SECTION    . CESAREAN SECTION N/A 01/02/2013   Procedure: CESAREAN SECTION;  Surgeon: Donnamae Jude, MD;  Location: [facility];  Service: Obstetrics;  Laterality: N/A;  Incidental Cyystotomy  . CESAREAN SECTION N/A 06/26/2016   Procedure: CESAREAN SECTION;  Surgeon: Aletha Halim, MD;  Location: [facility];  Service: Obstetrics;  Laterality: N/A;  . CYSTOTOMY     with repair at 2014 repeat c-section    Objective:   Vitals: BP 113/82   Pulse 70   Temp 98.3 F (36.8 C)   Resp 16   SpO2 100%   Physical Exam  Constitutional: She is oriented to person, place, and time. She appears well-developed and well-nourished.  HENT:  Mouth/Throat: Oropharynx is clear and moist.  Eyes: Pupils are equal, round, and reactive to light. EOM are normal. Right eye exhibits no discharge.  Left eye exhibits no discharge. No scleral icterus.  Cardiovascular: Normal rate.  Pulmonary/Chest: Effort normal.  Musculoskeletal:       Right knee: She exhibits normal range of motion, no swelling, no effusion, no ecchymosis, no deformity, no erythema, normal alignment and normal patellar mobility. Tenderness found. Lateral joint line and patellar tendon tenderness noted. No medial joint line, no MCL and no LCL tenderness noted.  Neurological: She is alert and oriented to person, place, and time. She displays normal reflexes. No cranial nerve deficit. Coordination normal.  Skin: Skin is warm and dry.  Psychiatric: She has a normal mood and affect.    Assessment and Plan :   Acute pain of right knee  Right sided temporal headache  I counseled patient that her symptoms are largely due to lack of hydration and strenuous work hours.  Recommended that she hydrate better, use meloxicam and see about changing her she has an number of days that she has to work.  Counseled that if they are unable to accommodate this, she may need to look for a new line of work to avoid being overworked.  Patient placed in working to find PCP.  IM Toradol offered in clinic today.  Follow-up as needed.   Jaynee Eagles, PA-C 03/02/18 1420

## 2018-03-02 NOTE — ED Triage Notes (Signed)
Pt c/o headache, R knee pain, denies injury.

## 2018-05-19 ENCOUNTER — Other Ambulatory Visit: Payer: Self-pay

## 2018-05-19 ENCOUNTER — Emergency Department (HOSPITAL_COMMUNITY): Payer: Medicaid Other

## 2018-05-19 ENCOUNTER — Encounter (HOSPITAL_COMMUNITY): Payer: Self-pay | Admitting: Emergency Medicine

## 2018-05-19 ENCOUNTER — Emergency Department (HOSPITAL_COMMUNITY)
Admission: EM | Admit: 2018-05-19 | Discharge: 2018-05-19 | Disposition: A | Discharge status: Home / Self Care | Payer: Medicaid Other | Attending: Emergency Medicine | Admitting: Emergency Medicine

## 2018-05-19 DIAGNOSIS — W010XXA Fall on same level from slipping, tripping and stumbling without subsequent striking against object, initial encounter: Secondary | ICD-10-CM | POA: Insufficient documentation

## 2018-05-19 DIAGNOSIS — Y92000 Kitchen of unspecified non-institutional (private) residence as  the place of occurrence of the external cause: Secondary | ICD-10-CM | POA: Insufficient documentation

## 2018-05-19 DIAGNOSIS — M25461 Effusion, right knee: Secondary | ICD-10-CM

## 2018-05-19 DIAGNOSIS — Y999 Unspecified external cause status: Secondary | ICD-10-CM | POA: Diagnosis not present

## 2018-05-19 DIAGNOSIS — S8991XA Unspecified injury of right lower leg, initial encounter: Secondary | ICD-10-CM | POA: Diagnosis present

## 2018-05-19 DIAGNOSIS — S8391XA Sprain of unspecified site of right knee, initial encounter: Secondary | ICD-10-CM | POA: Diagnosis not present

## 2018-05-19 DIAGNOSIS — Y939 Activity, unspecified: Secondary | ICD-10-CM | POA: Diagnosis not present

## 2018-05-19 MED ORDER — MELOXICAM 7.5 MG PO TABS: 7.5000 mg | ORAL_TABLET | Freq: Every day | ORAL | 0 refills | Status: DC

## 2018-05-19 NOTE — ED Notes (Signed)
Ace wrap applied to patient's right leg

## 2018-05-19 NOTE — ED Triage Notes (Signed)
Pt reports right leg pain X2 weeks.  Knee is slightly swollen, warm to touch, good pulse and color.

## 2018-05-19 NOTE — ED Notes (Signed)
Patient verbalizes understanding of discharge instructions. Opportunity for questioning and answers were provided. Armband removed by staff, pt discharged from ED.  

## 2018-05-19 NOTE — ED Notes (Signed)
ED Provider at bedside. 

## 2018-05-19 NOTE — Discharge Instructions (Signed)
Home to rest. Take Meloxicam as prescribed. You may apply IcyHot to area for relief as well. Follow up with your doctor if pain continues.

## 2018-05-19 NOTE — ED Provider Notes (Signed)
[facility] EMERGENCY DEPARTMENT Provider Note   CSN: [id number] Arrival date & time: 05/19/18  0104     History   Chief Complaint Chief Complaint  Patient presents with  . Leg Pain    HPI Michaela Moran is a 37 y.o. female.  37yo female presents with complaint of right knee pain and swelling as well as right leg pain. Patient states she slipped and fell 2 weeks ago in her kitchen, landed on her right side. Patient states initially she had pain in her right low back/hip area that radiates down her right lateral thigh as well as pain and swelling in her right knee. 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 No other injuries, complaints, concerns.      Past Medical History:  Diagnosis Date  . Acid reflux   . Female circumcision   . Hemorrhoid   . 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 Hypercholesteremia     Patient Active Problem List   Diagnosis Date Noted  . Encounter for initial prescription of Nexplanon 08/21/2016  . S/P repeat low transverse C-section 06/27/2016  . 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 Previous cesarean delivery, antepartum condition or complication 56/43/3295  . Language barrier, cultural differences 04/28/2016  . Advanced maternal age in multigravida     Past Surgical History:  Procedure Laterality Date  . CESAREAN SECTION    . CESAREAN SECTION N/A 01/02/2013   Procedure: CESAREAN SECTION;  Surgeon: Donnamae Jude, MD;  Location: [facility];  Service: Obstetrics;  Laterality: N/A;  Incidental Cyystotomy  . CESAREAN SECTION N/A 06/26/2016   Procedure: CESAREAN SECTION;  Surgeon: Aletha Halim, MD;  Location: [facility];  Service: Obstetrics;  Laterality: N/A;  . CYSTOTOMY     with repair at 2014 repeat c-section     OB History    Gravida  3   Para  3   Term  3   Preterm      AB     Living  3     SAB      TAB      Ectopic      Multiple  0   Live Births  3            Home Medications    Prior to Admission medications   Medication Sig Start Date End Date Taking? Authorizing Provider  etonogestrel (NEXPLANON) 68 MG IMPL implant 1 each by Subdermal route once. Implanted October 2017    [provider]  meloxicam (MOBIC) 7.5 MG tablet Take 1 tablet (7.5 mg total) by mouth daily. 05/19/18   Tacy Learn, PA-C  omeprazole (PRILOSEC) 20 MG capsule Take 1 capsule (20 mg total) by mouth daily. 10/26/17   Emeline General, PA-C    Family History Family History  Problem Relation Age of Onset  . Diabetes Mother   . Diabetes Sister   . Diabetes Brother   . Hypertension Brother   . Stroke Maternal Aunt   . Other Neg Hx     Social History Social History   Tobacco Use  . Smoking status: Never Smoker  . Smokeless tobacco: Never Used  Substance Use Topics  . Alcohol use: No  . Drug use: No     Allergies   Other   Review of Systems Review of Systems  Constitutional: Negative for  fever.  Musculoskeletal: Positive for arthralgias, gait problem, joint swelling and myalgias.  Skin: Negative for rash and wound.  Allergic/Immunologic: Negative for immunocompromised state.  Neurological: Negative for weakness and numbness.  Hematological: Does not bruise/bleed easily.  All other systems reviewed and are negative.    Physical Exam Updated Vital Signs BP 104/81   Pulse (!) 57   Temp 97.8 F (36.6 C) (Oral)   Resp 16   SpO2 100%   Physical Exam  Constitutional: She is oriented to person, place, and time. She appears well-developed and well-nourished. No distress.  HENT:  Head: Normocephalic and atraumatic.  Cardiovascular: Intact distal pulses.  Pulmonary/Chest: Effort normal.  Musculoskeletal: She exhibits tenderness. She exhibits no deformity.       Right hip: Normal.       Right knee: She exhibits decreased range of motion,  effusion, ecchymosis and bony tenderness. She exhibits no deformity and normal patellar mobility. Tenderness found. Lateral joint line tenderness noted.       Lumbar back: She exhibits tenderness. She exhibits no bony tenderness, no swelling, no edema and no deformity.       Back:       Legs: Neurological: She is alert and oriented to person, place, and time. No sensory deficit.  Skin: Skin is warm and dry. No rash noted. She is not diaphoretic. No erythema.  Psychiatric: She has a normal mood and affect. Her behavior is normal.  Nursing note and vitals reviewed.    ED Treatments / Results  Labs (all labs ordered are listed, but only abnormal results are displayed) Labs Reviewed - No data to display  EKG None  Radiology Ct Knee Right Wo Contrast  Result Date: 05/19/2018 CLINICAL DATA:  Patient fell in July pain behind the knee, Abnormal knee xray EXAM: CT OF THE RIGHT KNEE WITHOUT CONTRAST TECHNIQUE: Multidetector CT imaging of the RIGHT knee was performed according to the standard protocol. Multiplanar CT image reconstructions were also generated. COMPARISON:  05/19/2018 plain films FINDINGS: Bones/Joint/Cartilage There is no acute fracture. There are degenerative changes with subchondral sclerosis and cyst formation along the LATERAL tibial plateau and distal MEDIAL femur. No suspicious lytic or blastic lesions. Ligaments Suboptimally assessed by CT. Muscles and Tendons Unremarkable. Soft tissues Small joint effusion is present.  No fat fluid levels. IMPRESSION: 1. No acute fracture or subluxation. 2. Mild degenerative changes. 3. Joint effusion. Electronically Signed   By: Nolon Nations M.D.   On: 05/19/2018 07:11   Dg Knee Complete 4 Views Right  Result Date: 05/19/2018 CLINICAL DATA:  37 year old female with right knee pain. No known injury. EXAM: RIGHT KNEE - COMPLETE 4+ VIEW COMPARISON:  None. FINDINGS: No definite acute fracture or dislocation. A linear lucency through the  articular surface of the lateral tibial plateau is most likely artifactual. Clinical correlation is recommended. No arthritic changes. There is a small to moderate suprapatellar effusion. The soft tissues are grossly unremarkable. IMPRESSION: 1. Probable artifact through the articular surface of the lateral tibial plateau. No definite acute fracture. No dislocation. 2. Small to moderate suprapatellar effusion. Electronically Signed   By: Anner Crete M.D.   On: 05/19/2018 01:58    Procedures Procedures (including critical care time)  Medications Ordered in ED Medications - No data to display   Initial Impression / Assessment and Plan / ED Course  I have reviewed the triage vital signs and the nursing notes.  Pertinent labs & imaging results that were available during my care of the patient  were reviewed by me and considered in my medical decision making (see chart for details).  Clinical Course as of May 19 724  Mon May 19, 2018  0723 37 yo female with right knee pain x 2 week since fall landing on right side. On exam, small effusion, anterior/lateral TTP, XR with artifact vs tibial plateau fracture. CT ordered for further evaluation, no fracture. Discussed results and POC with patient, recommend ace wrap, rest, Meloxicam, ice/heat and follow up with PCP for recheck.    [LM]    Clinical Course User Index [LM] Tacy Learn, PA-C    Final Clinical Impressions(s) / ED Diagnoses   Final diagnoses:  Sprain of right knee, unspecified ligament, initial encounter  Effusion, right knee    ED Discharge Orders         Ordered    meloxicam (MOBIC) 7.5 MG tablet  Daily     05/19/18 0722           Tacy Learn, PA-C 05/19/18 Jeannetta Nap, MD 05/19/18 [phone]

## 2018-06-19 ENCOUNTER — Encounter (HOSPITAL_COMMUNITY): Payer: Self-pay

## 2018-06-19 ENCOUNTER — Other Ambulatory Visit: Payer: Self-pay

## 2018-06-19 ENCOUNTER — Emergency Department (HOSPITAL_COMMUNITY)
Admission: EM | Admit: 2018-06-19 | Discharge: 2018-06-19 | Disposition: A | Discharge status: Home / Self Care | Payer: Medicaid Other | Attending: Emergency Medicine | Admitting: Emergency Medicine

## 2018-06-19 ENCOUNTER — Emergency Department (HOSPITAL_BASED_OUTPATIENT_CLINIC_OR_DEPARTMENT_OTHER): Payer: Medicaid Other

## 2018-06-19 DIAGNOSIS — R51 Headache: Secondary | ICD-10-CM | POA: Insufficient documentation

## 2018-06-19 DIAGNOSIS — R600 Localized edema: Secondary | ICD-10-CM | POA: Insufficient documentation

## 2018-06-19 DIAGNOSIS — M25561 Pain in right knee: Secondary | ICD-10-CM | POA: Diagnosis not present

## 2018-06-19 DIAGNOSIS — Z79899 Other long term (current) drug therapy: Secondary | ICD-10-CM | POA: Diagnosis not present

## 2018-06-19 DIAGNOSIS — M5441 Lumbago with sciatica, right side: Secondary | ICD-10-CM | POA: Insufficient documentation

## 2018-06-19 DIAGNOSIS — G8929 Other chronic pain: Secondary | ICD-10-CM

## 2018-06-19 DIAGNOSIS — M7989 Other specified soft tissue disorders: Secondary | ICD-10-CM

## 2018-06-19 MED ORDER — CYCLOBENZAPRINE HCL 10 MG PO TABS: 10.0000 mg | ORAL_TABLET | Freq: Two times a day (BID) | ORAL | 0 refills | Status: DC | PRN

## 2018-06-19 MED ORDER — PREDNISONE 10 MG (21) PO TBPK: ORAL_TABLET | Freq: Every day | ORAL | 0 refills | Status: DC

## 2018-06-19 NOTE — ED Notes (Signed)
Declined W/C at D/C and was escorted to lobby by RN. 

## 2018-06-19 NOTE — ED Triage Notes (Signed)
Pt presents for evaluation of knee pain. States was seen recently for similar but today she was at work when she had right leg swelling from knee to hip with pain to flank as well. States headache as well.

## 2018-06-19 NOTE — Discharge Instructions (Addendum)
Were evaluated today for back pain and right knee pain.  Ultrasound of your leg was negative for blood clot.  Your knee pain is most likely her chronic knee pain secondary to your injury a few months ago.  Please follow-up with orthopedics for this pain.  Back pain is called sciatica.  I prescribed medicines for this.  Please continue to take this medicine as prescribed.  Get help right away if: You cannot control when you pee (urinate) or poop (have a bowel movement). You have weakness in any of these areas and it gets worse. Lower back. Lower belly (pelvis). Butt (buttocks). Legs. You have redness or swelling of your back. You have a burning feeling when you pee.

## 2018-06-19 NOTE — Progress Notes (Signed)
RLE venous duplex prelim: negative for DVT.  Tarisa Paola Eunice, RDMS, RVT  

## 2018-06-19 NOTE — ED Notes (Signed)
Patient transported to Ultrasound 

## 2018-06-19 NOTE — ED Provider Notes (Signed)
[facility] EMERGENCY DEPARTMENT Provider Note   CSN: [id number] Arrival date & time: 06/19/18  1329   History   Chief Complaint Chief Complaint  Patient presents with  . Knee Pain    HPI Michaela Moran is a 37 y.o. female no significant medical history who presents for evaluation of right knee pain and back pain.  Patient states she fell approximately 2 months ago and is felt constant pain to her right knee.  Had plain film and CT of knee which was negative for fracture dislocation.  Patient states she went to work today and noticed her right leg from the knee down was swollen and painful.  Pain is rated a 5/10.  Pain does not radiate.  Describes the pain as dull aching. Does not use any contraceptives, no recent surgery or travel, history of malignancy.  Admits to lower back pain x12 hours.  This pain radiates down into her right buttocks.  Denies history of sciatica.  Denies fever, chills, neck pain, headache, blurred vision, chest pain, shortness of breath, abdominal pain, trauma or recent injury.  Denies history of IV drug use, bowel or bladder incontinence, saddle paresthesia, history of malignancy.  History obtained from patient.  History obtained via medical interpreter. HPI  Past Medical History:  Diagnosis Date  . Acid reflux   . Female circumcision   . Hemorrhoid   . 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 Hypercholesteremia     Patient Active Problem List   Diagnosis Date Noted  . Encounter for initial prescription of Nexplanon 08/21/2016  . S/P repeat low transverse C-section 06/27/2016  . 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 Previous cesarean delivery, antepartum condition or complication 89/21/1941  . Language barrier, cultural differences 04/28/2016  . Advanced maternal age in multigravida     Past Surgical History:  Procedure Laterality Date  . CESAREAN SECTION    . CESAREAN SECTION N/A  01/02/2013   Procedure: CESAREAN SECTION;  Surgeon: Donnamae Jude, MD;  Location: [facility];  Service: Obstetrics;  Laterality: N/A;  Incidental Cyystotomy  . CESAREAN SECTION N/A 06/26/2016   Procedure: CESAREAN SECTION;  Surgeon: Aletha Halim, MD;  Location: [facility];  Service: Obstetrics;  Laterality: N/A;  . CYSTOTOMY     with repair at 2014 repeat c-section     OB History    Gravida  3   Para  3   Term  3   Preterm      AB      Living  3     SAB      TAB      Ectopic      Multiple  0   Live Births  3            Home Medications    Prior to Admission medications   Medication Sig Start Date End Date Taking? Authorizing Provider  cyclobenzaprine (FLEXERIL) 10 MG tablet Take 1 tablet (10 mg total) by mouth 2 (two) times daily as needed for muscle spasms. 06/19/18   Deronte Solis A, PA-C  etonogestrel (NEXPLANON) 68 MG IMPL implant 1 each by Subdermal route once. Implanted October 2017    [provider]  meloxicam (MOBIC) 7.5 MG tablet Take 1 tablet (7.5 mg total) by mouth daily. 05/19/18   Tacy Learn, PA-C  omeprazole (PRILOSEC) 20 MG capsule Take 1 capsule (20 mg total) by mouth daily. 10/26/17  Avie Echevaria B, PA-C  predniSONE (STERAPRED UNI-PAK 21 TAB) 10 MG (21) TBPK tablet Take by mouth daily. Take 6 tabs by mouth daily  for 2 days, then 5 tabs for 2 days, then 4 tabs for 2 days, then 3 tabs for 2 days, 2 tabs for 2 days, then 1 tab by mouth daily for 2 days 06/19/18   Maytte Jacot A, PA-C    Family History Family History  Problem Relation Age of Onset  . Diabetes Mother   . Diabetes Sister   . Diabetes Brother   . Hypertension Brother   . Stroke Maternal Aunt   . Other Neg Hx     Social History Social History   Tobacco Use  . Smoking status: Never Smoker  . Smokeless tobacco: Never Used  Substance Use Topics  . Alcohol use: No  . Drug use: No     Allergies   Other   Review of Systems Review of Systems    Constitutional: Negative for activity change, appetite change, chills, diaphoresis, fatigue and fever.  Gastrointestinal: Negative.   Genitourinary: Negative.   Musculoskeletal: Positive for back pain. Negative for gait problem, joint swelling, myalgias, neck pain and neck stiffness.       Knee pain  Skin: Negative.   Neurological: Negative for dizziness, facial asymmetry, weakness, light-headedness, numbness and headaches.     Physical Exam Updated Vital Signs BP 119/70 (BP Location: Right Arm)   Pulse 70   Temp 98.1 F (36.7 C) (Oral)   Resp 16   SpO2 100%   Physical Exam Physical Exam  Constitutional: Pt appears well-developed and well-nourished. No distress.  HENT:  Head: Normocephalic and atraumatic.  Mouth/Throat: Oropharynx is clear and moist. No oropharyngeal exudate.  Eyes: Conjunctivae are normal.  Neck: Normal range of motion. Neck supple.  Full ROM without pain  Cardiovascular: Normal rate, regular rhythm and intact distal pulses.   Pulmonary/Chest: Effort normal and breath sounds normal. No respiratory distress. Pt has no wheezes.  Abdominal: Soft. Pt exhibits no distension. There is no tenderness, rebound or guarding. No abd bruit or pulsatile mass Musculoskeletal:  Full range of motion of the T-spine and L-spine with flexion, hyperextension, and lateral flexion. No midline tenderness or stepoffs. No tenderness to palpation of the spinous processes of the T-spine or L-spine. Mild tenderness to palpation of the paraspinous muscles of the L-spine. Positive straight leg raise. To palpation right posterior calf.  No appreciable swelling.  Able to flex and extend right leg at knee without difficulty.  Negative varus and valgus stress.  Lymphadenopathy:    Pt has no cervical adenopathy.  Neurological: Pt is alert. Pt has normal reflexes.  Reflex Scores:      Bicep reflexes are 2+ on the right side and 2+ on the left side.      Brachioradialis reflexes are 2+ on the  right side and 2+ on the left side.      Patellar reflexes are 2+ on the right side and 2+ on the left side.      Achilles reflexes are 2+ on the right side and 2+ on the left side. Speech is clear and goal oriented, follows commands Normal 5/5 strength in upper and lower extremities bilaterally including dorsiflexion and plantar flexion, strong and equal grip strength Sensation normal to light and sharp touch Moves extremities without ataxia, coordination intact Normal gait Normal balance No Clonus Skin: Skin is warm and dry. No rash noted or lesions noted. Pt is not diaphoretic.  No erythema, ecchymosis,edema or warmth.  Psychiatric: Pt has a normal mood and affect. Behavior is normal.  Nursing note and vitals reviewed.  ED Treatments / Results  Labs (all labs ordered are listed, but only abnormal results are displayed) Labs Reviewed - No data to display  EKG None  Radiology No results found.  Procedures Procedures (including critical care time)  Medications Ordered in ED Medications - No data to display   Initial Impression / Assessment and Plan / ED Course  I have reviewed the triage vital signs and the nursing notes.  Pertinent labs & imaging results that were available during my care of the patient were reviewed by me and considered in my medical decision making (see chart for details).  37 year old female presents for evaluation of back pain and right knee pain. Nonfocal neuro exam without neuro deficits.  Positive straight leg raise.  Feel patient most likely has sciatica. Patient can walk but states is painful.  No loss of bowel or bladder control.  No concern for cauda equina.  No fever, night sweats, weight loss, h/o cancer, IVDU.    Patient states she has continued to have chronic knee pain post fall 2 months ago.  During her initial visit she received an x-ray and CT scan without evidence of fracture or dislocation.  Has been using NSAIDs with mild relief of  symptoms.  Denies any new injuries.  States today she noticed she had some pain to her posterior right calf.  Does not have any risk factors for DVT, no swelling of right calf.  Will obtain ultrasound to rule out DVT.   Ultrasound negative for DVT.  Her knee pain is most likely her chronic knee pain post her injury a few months ago.  Will have her follow-up with orthopedics for this chronic pain.  Will prescribe medicine for her sciatica.  She may also follow-up with orthopedics her primary care provider for this as well.  Discussed with patient strict return precautions.  Patient voiced understanding and is agreeable for return.    Final Clinical Impressions(s) / ED Diagnoses   Final diagnoses:  Chronic pain of right knee  Acute right-sided low back pain with right-sided sciatica    ED Discharge Orders         Ordered    cyclobenzaprine (FLEXERIL) 10 MG tablet  2 times daily PRN     06/19/18 1726    predniSONE (STERAPRED UNI-PAK 21 TAB) 10 MG (21) TBPK tablet  Daily     06/19/18 1726           Ademide Schaberg A, PA-C 06/19/18 1739    Little, Wenda Overland, MD 06/20/18 1527

## 2018-06-19 NOTE — ED Provider Notes (Signed)
Patient placed in Quick Look pathway, seen and evaluated   Chief Complaint: right knee pain  HPI: Michaela Moran is a 36 y.o. female who presents to the ED with right knee pain that has been persistent since last visits to the ED 9/2 after a fall. Patient states today she was at work when she had right leg swelling from knee to hip with pain to flank as well. States headache as well. patient was treated with ace wrap and NSAIDS at her previous visit. She also had knee x-ray and CT of right knee.  ROS: M/S: knee pain  Neuro: headache  Physical Exam:  BP 119/70 (BP Location: Right Arm)   Pulse 70   Temp 98.1 F (36.7 C) (Oral)   Resp 16   SpO2 100%    Gen: No distress  Neuro: Awake and Alert  Skin: Warm and dry  M/S: right knee tenderness   Initiation of care has begun. The patient has been counseled on the process, plan, and necessity for staying for the completion/evaluation, and the remainder of the medical screening examination    Ashley Murrain, NP 06/19/18 1436    Malvin Johns, MD 06/19/18 1534

## 2018-06-26 ENCOUNTER — Ambulatory Visit (INDEPENDENT_AMBULATORY_CARE_PROVIDER_SITE_OTHER): Payer: Self-pay | Admitting: Orthopaedic Surgery

## 2018-06-26 ENCOUNTER — Encounter (INDEPENDENT_AMBULATORY_CARE_PROVIDER_SITE_OTHER): Payer: Self-pay | Admitting: Orthopaedic Surgery

## 2018-06-26 DIAGNOSIS — M25561 Pain in right knee: Secondary | ICD-10-CM

## 2018-06-26 DIAGNOSIS — G8929 Other chronic pain: Secondary | ICD-10-CM

## 2018-06-26 HISTORY — DX: Other chronic pain: G89.29

## 2018-06-26 MED ORDER — METHYLPREDNISOLONE ACETATE 40 MG/ML IJ SUSP
40.0000 mg | INTRAMUSCULAR | Status: AC | PRN
  Administered 2018-06-26: 40 mg via INTRA_ARTICULAR

## 2018-06-26 MED ORDER — BUPIVACAINE HCL 0.25 % IJ SOLN
2.0000 mL | INTRAMUSCULAR | Status: AC | PRN
  Administered 2018-06-26: 2 mL via INTRA_ARTICULAR

## 2018-06-26 MED ORDER — DICLOFENAC SODIUM 1 % TD GEL: 2.0000 g | Freq: Four times a day (QID) | TRANSDERMAL | 2 refills | Status: DC

## 2018-06-26 MED ORDER — LIDOCAINE HCL 1 % IJ SOLN
2.0000 mL | INTRAMUSCULAR | Status: AC | PRN
  Administered 2018-06-26: 2 mL

## 2018-06-26 NOTE — Progress Notes (Signed)
Office Visit Note   [name]           Date of Birth: 01/18/1981           MRN: [id number] Visit Date: 06/26/2018              Requested by: No referring provider defined for this encounter. PCP: Patient, No Pcp Per   Assessment & Plan: Visit Diagnoses:  1. Chronic pain of right knee     Plan: Impression is right knee reactive synovitis.  We will inject the right knee with cortisone today.  I have also called in Voltaren gel to use as needed.  She will follow-up with [place] as needed.  We have discussed obtaining an MRI if needed in the future.  Call with concerns or questions.  Of note, the entire encounter was translated through a [place] interpreter over the phone.  Follow-Up Instructions: Return if symptoms worsen or fail to improve.   Orders:  Orders Placed This Encounter  Procedures  . Large Joint Inj: R knee   Meds ordered this encounter  Medications  . diclofenac sodium (VOLTAREN) 1 % GEL    Sig: Apply 2 g topically 4 (four) times daily.    Dispense:  1 Tube    Refill:  2      Procedures: Large Joint Inj: R knee on 06/26/2018 8:25 AM Indications: pain Details: 22 G needle, anterolateral approach Medications: 2 mL lidocaine 1 %; 2 mL bupivacaine 0.25 %; 40 mg methylPREDNISolone acetate 40 MG/ML      Clinical Data: No additional findings.   Subjective: Chief Complaint  Patient presents with  . Right Knee - Pain    HPI patient is a pleasant 37 year old [place] speaking female who presents to our clinic today for follow-up of right knee pain.  She fell landing on the lateral aspect of her right knee approximately 2 months ago.  She was recently seen in the ED where x-rays as well as a CT scan were obtained.  Imaging showed subchondral sclerosis and cyst formation along the lateral tibial plateau and medial femoral condyle.  No other acute findings.  The patient has had pain to the entire knee worse with ambulation.  Nothing seems to make this better.   No previous injury to the right knee.  Review of Systems as detailed in HPI.  All others reviewed and are no   Objective: Vital Signs: There were no vitals taken for this visit.  Physical Exam.  Well-developed and well-nourished female in no acute distress.  Alert and oriented x3.  Ortho Exam examination of the right knee reveals a trace effusion.  Medial and lateral joint line tenderness.  Mild patellofemoral crepitus.  Ligaments are stable to valgus and varus.  She is neurovascularly intact distally.  Specialty Comments:  No specialty comments available.  Imaging: No new imaging   PMFS History: Patient Active Problem List   Diagnosis Date Noted  . Chronic pain of right knee 06/26/2018  . Encounter for initial prescription of Nexplanon 08/21/2016  . S/P repeat low transverse C-section 06/27/2016  . 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 Previous cesarean delivery, antepartum condition or complication 62/22/9798  . Language barrier, cultural differences 04/28/2016  . Advanced maternal age in multigravida    Past Medical History:  Diagnosis Date  . Acid reflux   . Female circumcision   . Hemorrhoid   . History of positive PPD    neg  CXR  . Hypercholesteremia     Family History  Problem Relation Age of Onset  . Diabetes Mother   . Diabetes Sister   . Diabetes Brother   . Hypertension Brother   . Stroke Maternal Aunt   . Other Neg Hx     Past Surgical History:  Procedure Laterality Date  . CESAREAN SECTION    . CESAREAN SECTION N/A 01/02/2013   Procedure: CESAREAN SECTION;  Surgeon: Donnamae Jude, MD;  Location: [facility];  Service: Obstetrics;  Laterality: N/A;  Incidental Cyystotomy  . CESAREAN SECTION N/A 06/26/2016   Procedure: CESAREAN SECTION;  Surgeon: Aletha Halim, MD;  Location: [facility];  Service: Obstetrics;  Laterality: N/A;  . CYSTOTOMY     with repair at 2014 repeat c-section    Social History   Occupational History  . Not on file  Tobacco Use  . Smoking status: Never Smoker  . Smokeless tobacco: Never Used  Substance and Sexual Activity  . Alcohol use: No  . Drug use: No  . Sexual activity: Yes    Birth control/protection: None

## 2018-06-27 ENCOUNTER — Ambulatory Visit: Payer: Self-pay

## 2018-06-27 ENCOUNTER — Encounter: Payer: Self-pay | Admitting: General Practice

## 2018-07-25 ENCOUNTER — Emergency Department (HOSPITAL_COMMUNITY): Payer: Medicaid Other

## 2018-07-25 ENCOUNTER — Encounter (HOSPITAL_COMMUNITY): Payer: Self-pay | Admitting: *Deleted

## 2018-07-25 ENCOUNTER — Emergency Department (HOSPITAL_COMMUNITY)
Admission: EM | Admit: 2018-07-25 | Discharge: 2018-07-25 | Disposition: A | Discharge status: Home / Self Care | Payer: Medicaid Other | Attending: Emergency Medicine | Admitting: Emergency Medicine

## 2018-07-25 DIAGNOSIS — Y939 Activity, unspecified: Secondary | ICD-10-CM | POA: Diagnosis not present

## 2018-07-25 DIAGNOSIS — W19XXXA Unspecified fall, initial encounter: Secondary | ICD-10-CM | POA: Insufficient documentation

## 2018-07-25 DIAGNOSIS — Y999 Unspecified external cause status: Secondary | ICD-10-CM | POA: Insufficient documentation

## 2018-07-25 DIAGNOSIS — M25561 Pain in right knee: Secondary | ICD-10-CM | POA: Diagnosis not present

## 2018-07-25 DIAGNOSIS — Y929 Unspecified place or not applicable: Secondary | ICD-10-CM | POA: Diagnosis not present

## 2018-07-25 DIAGNOSIS — Z79899 Other long term (current) drug therapy: Secondary | ICD-10-CM | POA: Insufficient documentation

## 2018-07-25 MED ORDER — ACETAMINOPHEN 325 MG PO TABS: 650.0000 mg | ORAL_TABLET | Freq: Four times a day (QID) | ORAL | 0 refills | Status: DC | PRN

## 2018-07-25 MED ORDER — DICLOFENAC SODIUM 1 % TD GEL: 2.0000 g | Freq: Four times a day (QID) | TRANSDERMAL | 0 refills | Status: DC

## 2018-07-25 NOTE — ED Provider Notes (Signed)
[facility] EMERGENCY DEPARTMENT Provider Note   CSN: [id number] Arrival date & time: 07/25/18  1155     History   Chief Complaint Chief Complaint  Patient presents with  . Knee Pain    HPI Michaela Moran is a 37 y.o. female.  HPI   Patient is a 37 year old female with a history of GERD, HLD, who presents emergency department today for evaluation of right knee pain that has been present for the last 2 weeks.  Patient states she had a mechanical fall 2 weeks ago where she fell onto her right knee.  Since then she has had pain to the medial aspect the right knee.  Pain is constant and severe in nature.  Is worse with ambulation and movement of the knee.  States she has seen an orthopedic doctor in the past and has gotten the injections in the knee which have resolved her symptoms.  She has tried no interventions for her current symptoms.  Past Medical History:  Diagnosis Date  . Acid reflux   . Female circumcision   . Hemorrhoid   . 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 Hypercholesteremia     Patient Active Problem List   Diagnosis Date Noted  . Chronic pain of right knee 06/26/2018  . Encounter for initial prescription of Nexplanon 08/21/2016  . S/P repeat low transverse C-section 06/27/2016  . 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 Previous cesarean delivery, antepartum condition or complication 17/40/8144  . Language barrier, cultural differences 04/28/2016  . Advanced maternal age in multigravida     Past Surgical History:  Procedure Laterality Date  . CESAREAN SECTION    . CESAREAN SECTION N/A 01/02/2013   Procedure: CESAREAN SECTION;  Surgeon: Donnamae Jude, MD;  Location: [facility];  Service: Obstetrics;  Laterality: N/A;  Incidental Cyystotomy  . CESAREAN SECTION N/A 06/26/2016   Procedure: CESAREAN SECTION;  Surgeon: Aletha Halim, MD;  Location: [facility];  Service: Obstetrics;   Laterality: N/A;  . CYSTOTOMY     with repair at 2014 repeat c-section     OB History    Gravida  3   Para  3   Term  3   Preterm      AB      Living  3     SAB      TAB      Ectopic      Multiple  0   Live Births  3            Home Medications    Prior to Admission medications   Medication Sig Start Date End Date Taking? Authorizing Provider  acetaminophen (TYLENOL) 325 MG tablet Take 2 tablets (650 mg total) by mouth every 6 (six) hours as needed. Do not take more than 4000mg  of tylenol per day 07/25/18   Forrestine Lecrone S, PA-C  cyclobenzaprine (FLEXERIL) 10 MG tablet Take 1 tablet (10 mg total) by mouth 2 (two) times daily as needed for muscle spasms. 06/19/18   Henderly, Britni A, PA-C  diclofenac sodium (VOLTAREN) 1 % GEL Apply 2 g topically 4 (four) times daily. 07/25/18   Charly Holcomb S, PA-C  etonogestrel (NEXPLANON) 68 MG IMPL implant 1 each by Subdermal route once. Implanted October 2017    [provider]  meloxicam (MOBIC) 7.5 MG tablet Take 1 tablet (7.5 mg total) by mouth daily. 05/19/18   Tacy Learn,  PA-C  omeprazole (PRILOSEC) 20 MG capsule Take 1 capsule (20 mg total) by mouth daily. 10/26/17   Emeline General, PA-C  predniSONE (STERAPRED UNI-PAK 21 TAB) 10 MG (21) TBPK tablet Take by mouth daily. Take 6 tabs by mouth daily  for 2 days, then 5 tabs for 2 days, then 4 tabs for 2 days, then 3 tabs for 2 days, 2 tabs for 2 days, then 1 tab by mouth daily for 2 days 06/19/18   Henderly, Britni A, PA-C    Family History Family History  Problem Relation Age of Onset  . Diabetes Mother   . Diabetes Sister   . Diabetes Brother   . Hypertension Brother   . Stroke Maternal Aunt   . Other Neg Hx     Social History Social History   Tobacco Use  . Smoking status: Never Smoker  . Smokeless tobacco: Never Used  Substance Use Topics  . Alcohol use: No  . Drug use: No     Allergies   Other   Review of Systems Review of Systems    Constitutional: Negative for chills and fever.  Musculoskeletal:       Right knee pain  Neurological: Negative for weakness and numbness.     Physical Exam Updated Vital Signs BP 114/88 (BP Location: Right Arm)   Pulse 74   Temp 98.3 F (36.8 C) (Oral)   Resp 16   SpO2 99%   Physical Exam  Constitutional: She appears well-developed and well-nourished. No distress.  HENT:  Head: Normocephalic and atraumatic.  Eyes: Conjunctivae are normal.  Neck: Neck supple.  Cardiovascular: Normal rate.  Pulmonary/Chest: Effort normal.  Musculoskeletal: Normal range of motion.  TTP to the right medial knee that reproduces pain. No joint effusion, warmth or erythema. FROM of the right knee. No joint laxity.  Neurological: She is alert.  Skin: Skin is warm and dry.  Psychiatric: She has a normal mood and affect.  Nursing note and vitals reviewed.    ED Treatments / Results  Labs (all labs ordered are listed, but only abnormal results are displayed) Labs Reviewed - No data to display  EKG None  Radiology Dg Knee Complete 4 Views Right  Result Date: 07/25/2018 CLINICAL DATA:  Fall down steps 2 weeks ago with medial right knee pain. EXAM: RIGHT KNEE - COMPLETE 4+ VIEW COMPARISON:  None. FINDINGS: No evidence of fracture, dislocation, or joint effusion. No evidence of arthropathy or other focal bone abnormality. Soft tissues are unremarkable. IMPRESSION: Negative. Electronically Signed   By: Marin Olp M.D.   On: 07/25/2018 13:51    Procedures Procedures (including critical care time)  Medications Ordered in ED Medications - No data to display   Initial Impression / Assessment and Plan / ED Course  I have reviewed the triage vital signs and the nursing notes.  Pertinent labs & imaging results that were available during my care of the patient were reviewed by me and considered in my medical decision making (see chart for details).  Final Clinical Impressions(s) / ED Diagnoses    Final diagnoses:  Acute pain of right knee   Patient presented with right medial knee pain after a mechanical fall 2 weeks ago.  Has tenderness to the medial aspect of the right knee.  No joint laxity.  Full range of motion of the knee.  No swelling, erythema or warmth to suggest septic arthritis.  Will obtain x-ray of the right knee.  Xray right knee with no acute  abnormality. Will give knee sleeve for comfort. Will also recommend tylenol and diclofenac gel. Pt refused crutches. She states she will follow up with her orthopedic doctor. Advised to return if worse. All questions answered and pt understands plan.  ED Discharge Orders         Ordered    acetaminophen (TYLENOL) 325 MG tablet  Every 6 hours PRN     07/25/18 1446    diclofenac sodium (VOLTAREN) 1 % GEL  4 times daily     07/25/18 [place], Vamsi Apfel S, PA-C 07/25/18 1446    Margette Fast, MD 07/25/18 2032

## 2018-07-25 NOTE — ED Notes (Signed)
Tech at bedside with ortho devices

## 2018-07-25 NOTE — ED Notes (Signed)
Patient refused crutches.

## 2018-07-25 NOTE — Discharge Instructions (Signed)
Please use medications as directed on your discharge paperwork.  Please follow up with your orthopedic doctor in the next 1-2 weeks for re-evaluation.   Please follow up with your primary care provider within 5-7 days for re-evaluation of your symptoms. If you do not have a primary care provider, information for a healthcare clinic has been provided for you to make arrangements for follow up care. Please return to the emergency department for any new or worsening symptoms.

## 2018-07-25 NOTE — ED Notes (Signed)
Patient able to ambulate independently  

## 2018-07-25 NOTE — ED Triage Notes (Signed)
Pt in c/o right knee pain, history of same and used to get injections into it but has not seen anyone in a while

## 2018-08-06 NOTE — Progress Notes (Signed)
Patient ID: Michaela Moran, female   DOB: 1981/05/08, 37 y.o.   MRN: [id number]      Michaela Moran, is a 37 y.o. female  FAO:[id number]  ONG:[id number]  DOB - Mar 09, 1981  Subjective:  Chief Complaint and HPI: Michaela Moran is a 37 y.o. female here today to establish care and for a follow up visit After being seen in the ED 07/25/2018 for R knee pain after a fall.  Didn't get meds filled bc she couldn't afford them.    Also has occasional hemorrhoids and they are acting up now and needs meds for this.    Also c/o general fatigue and body aches for several months.  The last couple of days, mild runny nose and ST. All kids in the household have colds  "[name]" with [place] interpreters translating.    From A/P: Patient is a 37 year old female with a history of GERD, HLD, who presents emergency department today for evaluation of right knee pain that has been present for the last 2 weeks.  Patient states she had a mechanical fall 2 weeks ago where she fell onto her right knee.  Since then she has had pain to the medial aspect the right knee.  Pain is constant and severe in nature.  Is worse with ambulation and movement of the knee.  States she has seen an orthopedic doctor in the past and has gotten the injections in the knee which have resolved her symptoms.  She has tried no interventions for her current symptoms.  From A/P: Patient presented with right medial knee pain after a mechanical fall 2 weeks ago.  Has tenderness to the medial aspect of the right knee.  No joint laxity.  Full range of motion of the knee.  No swelling, erythema or warmth to suggest septic arthritis.  Will obtain x-ray of the right knee.  Xray right knee with no acute abnormality. Will give knee sleeve for comfort. Will also recommend tylenol and diclofenac gel. Pt refused crutches. She states she will follow up with her orthopedic doctor. Advised to return if worse. All questions answered and pt understands plan.  ED/Hospital notes  reviewed and summarized above.    ROS:   Constitutional:  No f/c, No night sweats, No unexplained weight loss. EENT:  No vision changes, No blurry vision, No hearing changes.  Respiratory: minimal cough, No SOB Cardiac: No CP, no palpitations GI:  No abd pain, No N/V/D. GU: No Urinary s/sx Musculoskeletal: R knee pain Neuro: No headache, no dizziness, no motor weakness.  Skin: No rash Endocrine:  No polydipsia. No polyuria.  Psych: Denies SI/HI  No problems updated.  ALLERGIES: Allergies  Allergen Reactions  . Other Swelling    Allergic to peas.    PAST MEDICAL HISTORY: Past Medical History:  Diagnosis Date  . Acid reflux   . Female circumcision   . Hemorrhoid   . 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 Hypercholesteremia     MEDICATIONS AT HOME: Prior to Admission medications   Medication Sig Start Date End Date Taking? Authorizing Provider  acetaminophen (TYLENOL) 325 MG tablet Take 2 tablets (650 mg total) by mouth every 6 (six) hours as needed. Do not take more than 4000mg  of tylenol per day 07/25/18  Yes Couture, Cortni S, PA-C  cyclobenzaprine (FLEXERIL) 10 MG tablet Take 1 tablet (10 mg total) by mouth 2 (two) times daily as needed for muscle spasms. 06/19/18  Yes Henderly, Britni A, PA-C  diclofenac sodium (VOLTAREN)  1 % GEL Apply 2 g topically 4 (four) times daily. 08/08/18  Yes , Dionne Bucy, PA-C  etonogestrel (NEXPLANON) 68 MG IMPL implant 1 each by Subdermal route once. Implanted October 2017   Yes [provider]  meloxicam (MOBIC) 7.5 MG tablet Take 1 tablet (7.5 mg total) by mouth 2 (two) times daily. Prn pain 08/08/18  Yes Argentina Donovan, PA-C  omeprazole (PRILOSEC) 20 MG capsule Take 1 capsule (20 mg total) by mouth daily. 10/26/17  Yes Emeline General, PA-C  hydrocortisone (ANUSOL-HC) 25 MG suppository Place 1 suppository (25 mg total) rectally 2 (two) times daily. 08/08/18   Argentina Donovan, PA-C     Objective:  EXAM:   Vitals:    08/08/18 1430  BP: 114/80  Pulse: 90  Temp: 97.6 F (36.4 C)  TempSrc: Oral  SpO2: 99%  Weight: 185 lb 3.2 oz (84 kg)    General appearance : A&OX3. NAD. Non-toxic-appearing HEENT: Atraumatic and Normocephalic.  PERRLA. EOM intact.  TM full B.  +nasal congestion Mouth-MMM, post pharynx WNL w/ mild erythema, + PND. Neck: supple, no JVD. No cervical lymphadenopathy. No thyromegaly Chest/Lungs:  Breathing-non-labored, Good air entry bilaterally, breath sounds normal without rales, rhonchi, or wheezing  CVS: S1 S2 regular, no murmurs, gallops, rubs  Extremities: Bilateral Lower Ext shows no edema, both legs are warm to touch with = pulse throughout R knee-no ballotment /effusion.  Mild swelling medially.  ROM full.  Ligaments stable.   Neurology:  CN II-XII grossly intact, Non focal.   Psych:  TP linear. J/I WNL. Normal speech. Appropriate eye contact and affect.  Skin:  No Rash  Data Review Lab Results  Component Value Date   HGBA1C 5.4 10/02/2013     Assessment & Plan   1. Recurrent pain of right knee - Ambulatory referral to Orthopedic Surgery - meloxicam (MOBIC) 7.5 MG tablet; Take 1 tablet (7.5 mg total) by mouth 2 (two) times daily. Prn pain  Dispense: 60 tablet; Refill: 0 - diclofenac sodium (VOLTAREN) 1 % GEL; Apply 2 g topically 4 (four) times daily.  Dispense: 100 g; Refill: 0  2. Fatigue, unspecified type - Comprehensive metabolic panel - TSH - CBC with Differential/Platelet - Vitamin D, 25-hydroxy  3. Hemorrhoids, unspecified hemorrhoid type - hydrocortisone (ANUSOL-HC) 25 MG suppository; Place 1 suppository (25 mg total) rectally 2 (two) times daily.  Dispense: 12 suppository; Refill: 0  4. Encounter for examination following treatment at hospital  5. Language barrier [place] interpreters used and additional time performing visit was required.  6.  URI-viral-resp care, fluids, rest. OTC cold meds     Patient have been counseled extensively about  nutrition and exercise  Return in about 1 month (around 09/07/2018) for assign PCP; recheck fatigue.  The patient was given clear instructions to go to ER or return to medical center if symptoms don't improve, worsen or new problems develop. The patient verbalized understanding. The patient was told to call to get lab results if they haven't heard anything in the next week.     Freeman Caldron, PA-C [facility] [place], [place]   08/08/2018, 2:57 PM

## 2018-08-08 ENCOUNTER — Ambulatory Visit: Payer: Self-pay | Attending: Family Medicine | Admitting: Physician Assistant

## 2018-08-08 VITALS — BP 114/80 | HR 90 | Temp 97.6°F | Wt 185.2 lb

## 2018-08-08 DIAGNOSIS — B9789 Other viral agents as the cause of diseases classified elsewhere: Secondary | ICD-10-CM

## 2018-08-08 DIAGNOSIS — J069 Acute upper respiratory infection, unspecified: Secondary | ICD-10-CM

## 2018-08-08 DIAGNOSIS — Z09 Encounter for follow-up examination after completed treatment for conditions other than malignant neoplasm: Secondary | ICD-10-CM

## 2018-08-08 DIAGNOSIS — Z79899 Other long term (current) drug therapy: Secondary | ICD-10-CM | POA: Insufficient documentation

## 2018-08-08 DIAGNOSIS — R5383 Other fatigue: Secondary | ICD-10-CM | POA: Diagnosis not present

## 2018-08-08 DIAGNOSIS — K649 Unspecified hemorrhoids: Secondary | ICD-10-CM

## 2018-08-08 DIAGNOSIS — M25561 Pain in right knee: Secondary | ICD-10-CM

## 2018-08-08 DIAGNOSIS — E785 Hyperlipidemia, unspecified: Secondary | ICD-10-CM | POA: Insufficient documentation

## 2018-08-08 DIAGNOSIS — Z789 Other specified health status: Secondary | ICD-10-CM

## 2018-08-08 DIAGNOSIS — K219 Gastro-esophageal reflux disease without esophagitis: Secondary | ICD-10-CM | POA: Insufficient documentation

## 2018-08-08 DIAGNOSIS — Z793 Long term (current) use of hormonal contraceptives: Secondary | ICD-10-CM | POA: Insufficient documentation

## 2018-08-08 DIAGNOSIS — E7800 Pure hypercholesterolemia, unspecified: Secondary | ICD-10-CM | POA: Insufficient documentation

## 2018-08-08 DIAGNOSIS — Z603 Acculturation difficulty: Secondary | ICD-10-CM

## 2018-08-08 MED ORDER — HYDROCORTISONE ACETATE 25 MG RE SUPP: 25.0000 mg | Freq: Two times a day (BID) | RECTAL | 0 refills | Status: DC

## 2018-08-08 MED ORDER — MELOXICAM 7.5 MG PO TABS: 7.5000 mg | ORAL_TABLET | Freq: Two times a day (BID) | ORAL | 0 refills | Status: DC

## 2018-08-08 MED ORDER — DICLOFENAC SODIUM 1 % TD GEL: 2.0000 g | Freq: Four times a day (QID) | TRANSDERMAL | 0 refills | Status: DC

## 2018-08-08 MED FILL — DICLOFENAC SODIUM 1% GEL: 1 | 25 days supply | Qty: 100 | Fill #0

## 2018-08-08 MED FILL — MELOXICAM 7.5 MG TABLET: 7.5 | 30 days supply | Qty: 60 | Fill #0

## 2018-08-08 MED FILL — HYDROCORTISONE ACETATE 25 M: 25 | 6 days supply | Qty: 12 | Fill #0

## 2018-08-09 LAB — COMPREHENSIVE METABOLIC PANEL
A/G RATIO: 1.5 (ref 1.2–2.2)
ALBUMIN: 4.1 g/dL (ref 3.5–5.5)
ALK PHOS: 74 IU/L (ref 39–117)
ALT: 15 IU/L (ref 0–32)
AST: 13 IU/L (ref 0–40)
BUN / CREAT RATIO: 17 (ref 9–23)
BUN: 13 mg/dL (ref 6–20)
CHLORIDE: 101 mmol/L (ref 96–106)
CO2: 24 mmol/L (ref 20–29)
Calcium: 9.4 mg/dL (ref 8.7–10.2)
Creatinine, Ser: 0.76 mg/dL (ref 0.57–1.00)
GFR calc Af Amer: 114 mL/min/{1.73_m2} (ref >59)
GFR calc non Af Amer: 99 mL/min/{1.73_m2} (ref >59)
GLUCOSE: 92 mg/dL (ref 65–99)
Globulin, Total: 2.8 g/dL (ref 1.5–4.5)
POTASSIUM: 3.7 mmol/L (ref 3.5–5.2)
Sodium: 141 mmol/L (ref 134–144)
Total Protein: 6.9 g/dL (ref 6.0–8.5)

## 2018-08-09 LAB — TSH: TSH: 1.39 u[IU]/mL (ref 0.450–4.500)

## 2018-08-09 LAB — CBC WITH DIFFERENTIAL/PLATELET
BASOS ABS: 0.1 10*3/uL (ref 0.0–0.2)
BASOS: 1 %
EOS (ABSOLUTE): 0.2 10*3/uL (ref 0.0–0.4)
Eos: 2 %
Hematocrit: 39.4 % (ref 34.0–46.6)
Hemoglobin: 13.5 g/dL (ref 11.1–15.9)
IMMATURE GRANS (ABS): 0 10*3/uL (ref 0.0–0.1)
Immature Granulocytes: 0 %
Lymphocytes Absolute: 3 10*3/uL (ref 0.7–3.1)
Lymphs: 30 %
MCH: 31.3 pg (ref 26.6–33.0)
MCHC: 34.3 g/dL (ref 31.5–35.7)
MCV: 91 fL (ref 79–97)
MONOS ABS: 1 10*3/uL — AB (ref 0.1–0.9)
Monocytes: 10 %
NEUTROS ABS: 5.8 10*3/uL (ref 1.4–7.0)
Neutrophils: 57 %
PLATELETS: 307 10*3/uL (ref 150–450)
RBC: 4.31 x10E6/uL (ref 3.77–5.28)
RDW: 12.9 % (ref 12.3–15.4)
WBC: 10.1 10*3/uL (ref 3.4–10.8)

## 2018-08-09 LAB — VITAMIN D 25 HYDROXY (VIT D DEFICIENCY, FRACTURES): VIT D 25 HYDROXY: 14.9 ng/mL — AB (ref 30.0–100.0)

## 2018-08-10 ENCOUNTER — Other Ambulatory Visit: Payer: Self-pay | Admitting: Physician Assistant

## 2018-08-10 MED ORDER — VITAMIN D (ERGOCALCIFEROL) 1.25 MG (50000 UNIT) PO CAPS: 50000.0000 [IU] | ORAL_CAPSULE | ORAL | 0 refills | Status: DC

## 2018-08-18 ENCOUNTER — Telehealth: Payer: Self-pay | Admitting: General Practice

## 2018-08-18 NOTE — Telephone Encounter (Signed)
Patient called to get their lab results. Was informed a call would be returned once received. Please follow up.

## 2018-08-19 NOTE — Telephone Encounter (Signed)
Pt was called back, questionable if message was left on voicemail due to a sudden tone.  Left message to call back. Will attempt again. Left message on the voicemail for contact number provided for the spouse.

## 2018-08-19 NOTE — Telephone Encounter (Signed)
Patient called for results and Michaela Moran the RMA gave them.

## 2018-09-01 ENCOUNTER — Ambulatory Visit: Payer: Medicaid Other | Admitting: Nurse Practitioner

## 2018-09-05 MED FILL — VIT D2 1.25 MG (50,000 UNIT: 1.25 MG | 4 days supply | Qty: 4 | Fill #0

## 2018-09-29 ENCOUNTER — Ambulatory Visit: Payer: Medicaid Other | Admitting: Nurse Practitioner

## 2018-11-21 ENCOUNTER — Emergency Department (HOSPITAL_COMMUNITY)
Admission: EM | Admit: 2018-11-21 | Discharge: 2018-11-21 | Disposition: A | Discharge status: Home / Self Care | Payer: Medicaid Other | Attending: Emergency Medicine | Admitting: Emergency Medicine

## 2018-11-21 ENCOUNTER — Encounter (HOSPITAL_COMMUNITY): Payer: Self-pay | Admitting: Emergency Medicine

## 2018-11-21 ENCOUNTER — Other Ambulatory Visit: Payer: Self-pay

## 2018-11-21 DIAGNOSIS — J069 Acute upper respiratory infection, unspecified: Secondary | ICD-10-CM | POA: Insufficient documentation

## 2018-11-21 DIAGNOSIS — Z79899 Other long term (current) drug therapy: Secondary | ICD-10-CM | POA: Insufficient documentation

## 2018-11-21 DIAGNOSIS — R05 Cough: Secondary | ICD-10-CM | POA: Insufficient documentation

## 2018-11-21 DIAGNOSIS — M7918 Myalgia, other site: Secondary | ICD-10-CM | POA: Diagnosis present

## 2018-11-21 DIAGNOSIS — B9789 Other viral agents as the cause of diseases classified elsewhere: Secondary | ICD-10-CM | POA: Diagnosis not present

## 2018-11-21 LAB — URINALYSIS, ROUTINE W REFLEX MICROSCOPIC
Bilirubin Urine: NEGATIVE
Glucose, UA: NEGATIVE mg/dL
Hgb urine dipstick: NEGATIVE
Ketones, ur: NEGATIVE mg/dL
Leukocytes,Ua: NEGATIVE
NITRITE: NEGATIVE
PROTEIN: NEGATIVE mg/dL
SPECIFIC GRAVITY, URINE: 1.017 (ref 1.005–1.030)
pH: 9 — ABNORMAL HIGH (ref 5.0–8.0)

## 2018-11-21 LAB — PREGNANCY, URINE: Preg Test, Ur: NEGATIVE

## 2018-11-21 MED ORDER — DM-GUAIFENESIN ER 60-1200 MG PO TB12: 1.0000 | ORAL_TABLET | Freq: Two times a day (BID) | ORAL | 0 refills | Status: DC

## 2018-11-21 MED ORDER — IBUPROFEN 600 MG PO TABS: 600.0000 mg | ORAL_TABLET | Freq: Four times a day (QID) | ORAL | 0 refills | Status: DC | PRN

## 2018-11-21 MED FILL — IBUPROFEN 600 MG TABLET: 600 | 7 days supply | Qty: 30 | Fill #0

## 2018-11-21 MED FILL — VIT D2 1.25 MG (50,000 UNIT: 1.25 MG | 84 days supply | Qty: 12 | Fill #1

## 2018-11-21 NOTE — ED Notes (Signed)
Patient verbalizes understanding of discharge instructions. Opportunity for questioning and answers were provided. Armband removed by staff, pt discharged from ED.  

## 2018-11-21 NOTE — ED Provider Notes (Signed)
[facility] EMERGENCY DEPARTMENT Provider Note   CSN: [id number] Arrival date & time: 11/21/18  0809    History   Chief Complaint Chief Complaint  Patient presents with  . Generalized Body Aches    HPI Michaela Moran is a 38 y.o. female.     HPI  Patient is a 38 yo female with a history of HLD, GERD presenting for myalgias, congestion, rhinorrhea, nonproductive cough. Patient reports that she traveled to [place] by pain approximately one week ago and symptoms started the day after she returned.  Patient reports that she thinks she may have had a fever yesterday but is unsure.  Patient reports that her myalgias are all over her body but are in the lower back today.  Denies dysuria, urgency, or frequency, however she is concerned that she could have a urinary tract infection.  This resolved with Tylenol and throat lozenges. Patient denies any known sick contacts, or interaction with anyone was had any recent national travel or positive for COVID-19.    [place] interpreter was offered to patient, however she reports that she does not need an interpreter and understands [place].  Past Medical History:  Diagnosis Date  . Acid reflux   . Female circumcision   . Hemorrhoid   . 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 Hypercholesteremia     Patient Active Problem List   Diagnosis Date Noted  . Chronic pain of right knee 06/26/2018  . Encounter for initial prescription of Nexplanon 08/21/2016  . S/P repeat low transverse C-section 06/27/2016  . 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 Previous cesarean delivery, antepartum condition or complication 33/35/4562  . Language barrier, cultural differences 04/28/2016  . Advanced maternal age in multigravida     Past Surgical History:  Procedure Laterality Date  . CESAREAN SECTION    . CESAREAN SECTION N/A 01/02/2013   Procedure: CESAREAN SECTION;  Surgeon: Donnamae Jude,  MD;  Location: [facility];  Service: Obstetrics;  Laterality: N/A;  Incidental Cyystotomy  . CESAREAN SECTION N/A 06/26/2016   Procedure: CESAREAN SECTION;  Surgeon: Aletha Halim, MD;  Location: [facility];  Service: Obstetrics;  Laterality: N/A;  . CYSTOTOMY     with repair at 2014 repeat c-section     OB History    Gravida  3   Para  3   Term  3   Preterm      AB      Living  3     SAB      TAB      Ectopic      Multiple  0   Live Births  3            Home Medications    Prior to Admission medications   Medication Sig Start Date End Date Taking? Authorizing Provider  diclofenac sodium (VOLTAREN) 1 % GEL Apply 2 g topically 4 (four) times daily. 08/08/18  Yes McClung, Dionne Bucy, PA-C  etonogestrel (NEXPLANON) 68 MG IMPL implant 1 each by Subdermal route once. Implanted October 2017   Yes [provider]  Vitamin D, Ergocalciferol, (DRISDOL) 1.25 MG (50000 UT) CAPS capsule Take 1 capsule (50,000 Units total) by mouth every 7 (seven) days. 08/10/18  Yes Argentina Donovan, PA-C  acetaminophen (TYLENOL) 325 MG tablet Take 2 tablets (650 mg total) by mouth every 6 (six) hours as needed. Do not take more than 4000mg  of tylenol per  day Patient not taking: Reported on 11/21/2018 07/25/18   Couture, Cortni S, PA-C  cyclobenzaprine (FLEXERIL) 10 MG tablet Take 1 tablet (10 mg total) by mouth 2 (two) times daily as needed for muscle spasms. Patient not taking: Reported on 11/21/2018 06/19/18   Henderly, Britni A, PA-C  hydrocortisone (ANUSOL-HC) 25 MG suppository Place 1 suppository (25 mg total) rectally 2 (two) times daily. Patient not taking: Reported on 11/21/2018 08/08/18   Argentina Donovan, PA-C  meloxicam (MOBIC) 7.5 MG tablet Take 1 tablet (7.5 mg total) by mouth 2 (two) times daily. Prn pain Patient not taking: Reported on 11/21/2018 08/08/18   Argentina Donovan, PA-C  omeprazole (PRILOSEC) 20 MG capsule Take 1 capsule (20 mg total) by mouth daily. Patient  not taking: Reported on 11/21/2018 10/26/17   Dossie Der    Family History Family History  Problem Relation Age of Onset  . Diabetes Mother   . Diabetes Sister   . Diabetes Brother   . Hypertension Brother   . Stroke Maternal Aunt   . Other Neg Hx     Social History Social History   Tobacco Use  . Smoking status: Never Smoker  . Smokeless tobacco: Never Used  Substance Use Topics  . Alcohol use: No  . Drug use: No     Allergies   Other   Review of Systems Review of Systems  Constitutional: Positive for chills. Negative for fever.  Gastrointestinal: Negative for nausea and vomiting.  Genitourinary: Negative for flank pain.  Musculoskeletal: Positive for arthralgias and myalgias.  Skin: Negative for rash.  Neurological: Negative for weakness and numbness.     Physical Exam Updated Vital Signs BP 109/77 (BP Location: Right Arm)   Pulse 67   Temp 97.9 F (36.6 C) (Oral)   Resp 16   Ht 5\' 6"  (1.676 m)   SpO2 100%   BMI 29.89 kg/m   Physical Exam Vitals signs and nursing note reviewed.  Constitutional:      General: She is not in acute distress.    Appearance: She is well-developed. She is not diaphoretic.     Comments: Sitting comfortably in bed.  HENT:     Head: Normocephalic and atraumatic.     Mouth/Throat:     Comments: Normal phonation. No muffled voice sounds. Patient swallows secretions without difficulty. Dentition normal. No lesions of tongue or buccal mucosa. Uvula midline. No asymmetric swelling of the posterior pharynx. No erythema of posterior pharynx. No tonsillar exuduate. No lingual swelling. No induration inferior to tongue. No submandibular tenderness, swelling, or induration.  Tissues of the neck supple. No cervical lymphadenopathy. Right TM without erythema or effusion; left TM without erythema or effusion.  Eyes:     General:        Right eye: No discharge.        Left eye: No discharge.     Conjunctiva/sclera:  Conjunctivae normal.     Comments: EOMs normal to gross examination.  Neck:     Musculoskeletal: Normal range of motion.  Cardiovascular:     Rate and Rhythm: Normal rate and regular rhythm.     Comments: Intact, 2+ radial pulse. Pulmonary:     Effort: Pulmonary effort is normal.     Breath sounds: Normal breath sounds. No wheezing or rales.  Abdominal:     General: There is no distension.  Musculoskeletal: Normal range of motion.     Comments: No midline tenderness of the lumbar spine.   Skin:  General: Skin is warm and dry.  Neurological:     Mental Status: She is alert.     Comments: Cranial nerves intact to gross observation. Patient moves extremities without difficulty.  Psychiatric:        Behavior: Behavior normal.        Thought Content: Thought content normal.        Judgment: Judgment normal.      ED Treatments / Results  Labs (all labs ordered are listed, but only abnormal results are displayed) Labs Reviewed  URINALYSIS, ROUTINE W REFLEX MICROSCOPIC - Abnormal; Notable for the following components:      Result Value   APPearance CLOUDY (*)    pH 9.0 (*)    All other components within normal limits  PREGNANCY, URINE    EKG None  Radiology No results found.  Procedures Procedures (including critical care time)  Medications Ordered in ED Medications - No data to display   Initial Impression / Assessment and Plan / ED Course  I have reviewed the triage vital signs and the nursing notes.  Pertinent labs & imaging results that were available during my care of the patient were reviewed by me and considered in my medical decision making (see chart for details).        Patient with symptoms consistent with a viral syndrome. Vitals are stable, no fever. No signs of dehydration. Lung exam normal, no signs of pneumonia.  Urinalysis is negative for infection.  Patient has not had any high risk travel or exposure to individuals with known influenza or  COVID-19 and is well-appearing today.  Supportive therapy indicated with return if symptoms worsen.  Patient is in understanding and agrees with the plan of care.  Final Clinical Impressions(s) / ED Diagnoses   Final diagnoses:  Viral URI with cough    ED Discharge Orders         Ordered    ibuprofen (ADVIL,MOTRIN) 600 MG tablet  Every 6 hours PRN     11/21/18 1025    Dextromethorphan-Guaifenesin 60-1200 MG 12hr tablet  Every 12 hours     11/21/18 1025           Tamala Julian 11/21/18 1229    Davonna Belling, MD 11/21/18 1514

## 2018-11-21 NOTE — Discharge Instructions (Addendum)
Please read and follow all provided instructions.  Your diagnoses today include:  1. Viral URI with cough     You appear to have an upper respiratory infection (URI). An upper respiratory tract infection, or cold, is a viral infection of the air passages leading to the lungs. It should improve gradually after 5-7 days. You may have a lingering cough that lasts for 2- 4 weeks after the infection.  Tests performed today include: Vital signs. See below for your results today.   Medications prescribed:   Take any prescribed medications only as directed. Treatment for your infection is aimed at treating the symptoms. There are no medications, such as antibiotics, that will cure your infection.   Home care instructions:  Follow any educational materials contained in this packet.   Your illness is contagious and can be spread to others, especially during the first 3 or 4 days. It cannot be cured by antibiotics or other medicines. Take basic precautions such as washing your hands often, covering your mouth when you cough or sneeze, and avoiding public places where you could spread your illness to others.   Please continue drinking plenty of fluids.  Use over-the-counter medicines as needed as directed on packaging for symptom relief.  You may also use ibuprofen or tylenol as directed on packaging for pain or fever.  Do not take multiple medicines containing Tylenol or acetaminophen to avoid taking too much of this medication.  Follow-up instructions: Please follow-up with your primary care provider in the next 3 days for further evaluation of your symptoms if you are not feeling better.   Return instructions:  Please return to the Emergency Department if you experience worsening symptoms.  RETURN IMMEDIATELY IF you develop shortness of breath, chest pain, confusion or altered mental status, a new rash, become dizzy, faint, or poorly responsive, or are unable to be cared for at home. Please return  if you have persistent vomiting and cannot keep down fluids or develop a fever that is not controlled by tylenol or motrin.   Please return if you have any other emergent concerns.  Additional Information:  Your vital signs today were: BP 109/77 (BP Location: Right Arm)    Pulse 67    Temp 97.9 F (36.6 C) (Oral)    Resp 16    Ht 5\' 6"  (1.676 m)    SpO2 100%    BMI 29.89 kg/m  If your blood pressure (BP) was elevated above 135/85 this visit, please have this repeated by your doctor within one month. --------------

## 2018-11-21 NOTE — ED Triage Notes (Signed)
Pt arrives to ED from home with complaints of generalized body aches since yesterday. Pt stated has general malaise and dry cough.

## 2018-12-08 ENCOUNTER — Emergency Department (HOSPITAL_COMMUNITY): Payer: Medicaid Other

## 2018-12-08 ENCOUNTER — Ambulatory Visit: Payer: Self-pay | Admitting: Internal Medicine

## 2018-12-08 ENCOUNTER — Other Ambulatory Visit: Payer: Self-pay

## 2018-12-08 ENCOUNTER — Emergency Department (HOSPITAL_COMMUNITY)
Admission: EM | Admit: 2018-12-08 | Discharge: 2018-12-08 | Disposition: A | Discharge status: Home / Self Care | Payer: Medicaid Other | Attending: Emergency Medicine | Admitting: Emergency Medicine

## 2018-12-08 DIAGNOSIS — Z79899 Other long term (current) drug therapy: Secondary | ICD-10-CM | POA: Diagnosis not present

## 2018-12-08 DIAGNOSIS — R0789 Other chest pain: Secondary | ICD-10-CM | POA: Diagnosis not present

## 2018-12-08 MED ORDER — NAPROXEN 500 MG PO TABS: 500.0000 mg | ORAL_TABLET | Freq: Two times a day (BID) | ORAL | 0 refills | Status: DC

## 2018-12-08 MED ORDER — BENZONATATE 100 MG PO CAPS: 100.0000 mg | ORAL_CAPSULE | Freq: Three times a day (TID) | ORAL | 0 refills | Status: DC

## 2018-12-08 NOTE — ED Triage Notes (Signed)
Pt arrives ambulatory with c/o chest pain and back pain with deep inspiration. Pt states she was seen in March with cough . Pt state SHOB worse at night. Speaks in complete sentences and respirations non labored.

## 2018-12-08 NOTE — ED Notes (Signed)
Patient transported to X-ray 

## 2018-12-08 NOTE — Discharge Instructions (Addendum)
Dear Michaela Moran  You came to [place] with chest pain. We have determined this was caused by musculoskeletal strain. Here are our recommendations for you at discharge:  Please take naproxen 500mg  twice a day for pain Please take benzonatate 100mg  3 times a day for cough  Thank you for choosing [facility].

## 2018-12-08 NOTE — ED Provider Notes (Signed)
[facility] EMERGENCY DEPARTMENT Provider Note   CSN: [id number] Arrival date & time: 12/08/18  0704    History   Chief Complaint Chief Complaint  Patient presents with  . Chest Pain  . Shortness of Breath   HPI Michaela Moran is a 38 y.o. female w/ PMH of obesity, hld and GERD presenting with chest and back pain. She states she was in her usual state of health until she woke up yesterday morning with constant 3/10 chest tightness with radiation to the back, worsened with cough. She states she was prescribed OTC meds during ED last visit which she tried with no relief (either Mucinex or ibuprofen). She came to the ED for cough 2 weeks ago and was told that she had a viral illness but she states she had no fever, no cough, N/V/D/C. She mentions no significant travel history. She denies any prior hx of chest pain or cardiac disease. She works at a [employer] but denies any significant heavy lifting or recent trauma.  Past Medical History:  Diagnosis Date  . Acid reflux   . Female circumcision   . Hemorrhoid   . 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 Hypercholesteremia    Patient Active Problem List   Diagnosis Date Noted  . Chronic pain of right knee 06/26/2018  . Encounter for initial prescription of Nexplanon 08/21/2016  . S/P repeat low transverse C-section 06/27/2016  . 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 Previous cesarean delivery, antepartum condition or complication 79/10/4095  . Language barrier, cultural differences 04/28/2016  . Advanced maternal age in multigravida    Past Surgical History:  Procedure Laterality Date  . CESAREAN SECTION    . CESAREAN SECTION N/A 01/02/2013   Procedure: CESAREAN SECTION;  Surgeon: Donnamae Jude, MD;  Location: [facility];  Service: Obstetrics;  Laterality: N/A;  Incidental Cyystotomy  . CESAREAN SECTION N/A 06/26/2016   Procedure: CESAREAN SECTION;   Surgeon: Aletha Halim, MD;  Location: [facility];  Service: Obstetrics;  Laterality: N/A;  . CYSTOTOMY     with repair at 2014 repeat c-section    OB History    Gravida  3   Para  3   Term  3   Preterm      AB      Living  3     SAB      TAB      Ectopic      Multiple  0   Live Births  3          Home Medications    Prior to Admission medications   Medication Sig Start Date End Date Taking? Authorizing Provider  acetaminophen (TYLENOL) 325 MG tablet Take 2 tablets (650 mg total) by mouth every 6 (six) hours as needed. Do not take more than 4000mg  of tylenol per day Patient not taking: Reported on 11/21/2018 07/25/18   Couture, Cortni S, PA-C  benzonatate (TESSALON) 100 MG capsule Take 1 capsule (100 mg total) by mouth every 8 (eight) hours. 12/08/18   Mosetta Anis, MD  cyclobenzaprine (FLEXERIL) 10 MG tablet Take 1 tablet (10 mg total) by mouth 2 (two) times daily as needed for muscle spasms. Patient not taking: Reported on 11/21/2018 06/19/18   Henderly, Britni A, PA-C  Dextromethorphan-Guaifenesin 60-1200 MG 12hr tablet Take 1 tablet by mouth every 12 (twelve) hours. 11/21/18   Langston Masker B, PA-C  diclofenac  sodium (VOLTAREN) 1 % GEL Apply 2 g topically 4 (four) times daily. 08/08/18   Argentina Donovan, PA-C  etonogestrel (NEXPLANON) 68 MG IMPL implant 1 each by Subdermal route once. Implanted October 2017    [provider]  hydrocortisone (ANUSOL-HC) 25 MG suppository Place 1 suppository (25 mg total) rectally 2 (two) times daily. Patient not taking: Reported on 11/21/2018 08/08/18   Argentina Donovan, PA-C  ibuprofen (ADVIL,MOTRIN) 600 MG tablet Take 1 tablet (600 mg total) by mouth every 6 (six) hours as needed. 11/21/18   Langston Masker B, PA-C  meloxicam (MOBIC) 7.5 MG tablet Take 1 tablet (7.5 mg total) by mouth 2 (two) times daily. Prn pain Patient not taking: Reported on 11/21/2018 08/08/18   Argentina Donovan, PA-C  naproxen (NAPROSYN) 500 MG  tablet Take 1 tablet (500 mg total) by mouth 2 (two) times daily. 12/08/18   Mosetta Anis, MD  omeprazole (PRILOSEC) 20 MG capsule Take 1 capsule (20 mg total) by mouth daily. Patient not taking: Reported on 11/21/2018 10/26/17   Emeline General, PA-C  Vitamin D, Ergocalciferol, (DRISDOL) 1.25 MG (50000 UT) CAPS capsule Take 1 capsule (50,000 Units total) by mouth every 7 (seven) days. 08/10/18   Argentina Donovan, PA-C   Family History Family History  Problem Relation Age of Onset  . Diabetes Mother   . Diabetes Sister   . Diabetes Brother   . Hypertension Brother   . Stroke Maternal Aunt   . Other Neg Hx    Social History Social History   Tobacco Use  . Smoking status: Never Smoker  . Smokeless tobacco: Never Used  Substance Use Topics  . Alcohol use: No  . Drug use: No   Allergies   Other  Review of Systems Review of Systems  Constitutional: Negative for chills, fatigue and fever.  Respiratory: Positive for cough. Negative for chest tightness, shortness of breath and wheezing.   Cardiovascular: Positive for chest pain. Negative for palpitations and leg swelling.  Gastrointestinal: Negative for abdominal pain, constipation, diarrhea, nausea and vomiting.  Genitourinary: Negative for dysuria, frequency and urgency.  Musculoskeletal: Positive for back pain. Negative for joint swelling and myalgias.  Neurological: Negative for dizziness, weakness, light-headedness and headaches.  All other systems reviewed and are negative.  Physical Exam Updated Vital Signs BP 106/79   Pulse 87   Temp 97.8 F (36.6 C) (Oral)   Resp 17   Ht 5\' 6"  (1.676 m)   Wt 81.6 kg   SpO2 100%   BMI 29.05 kg/m   Physical Exam Constitutional:      General: She is not in acute distress.    Appearance: She is obese.  Neck:     Musculoskeletal: Normal range of motion and neck supple.  Cardiovascular:     Rate and Rhythm: Normal rate and regular rhythm.     Heart sounds: Normal heart sounds.  Heart sounds not distant. No murmur.  Pulmonary:     Effort: Pulmonary effort is normal.     Breath sounds: Normal breath sounds. No wheezing or rales.  Chest:     Chest wall: Tenderness (sternal tenderness to palpation) present.  Abdominal:     General: Bowel sounds are normal.     Palpations: Abdomen is soft.  Musculoskeletal: Normal range of motion.  Skin:    General: Skin is warm and dry.  Neurological:     Mental Status: She is alert.     ED Treatments / Results  Labs (  all labs ordered are listed, but only abnormal results are displayed) Labs Reviewed - No data to display  EKG EKG Interpretation  Date/Time:  Monday December 08 2018 07:13:20 EDT Ventricular Rate:  92 PR Interval:    QRS Duration: 87 QT Interval:  341 QTC Calculation: 422 R Axis:   69 Text Interpretation:  Sinus rhythm Borderline T wave abnormalities Baseline wander in lead(s) V5 Confirmed by Dene Gentry [id number]) on 12/08/2018 7:20:35 AM  Radiology Dg Chest 2 View  Result Date: 12/08/2018 CLINICAL DATA:  Chest pain, cough. EXAM: CHEST - 2 VIEW COMPARISON:  Radiographs of April 24, 2017. FINDINGS: The heart size and mediastinal contours are within normal limits. Both lungs are clear. No pneumothorax or pleural effusion is noted. The visualized skeletal structures are unremarkable. IMPRESSION: No active cardiopulmonary disease. Electronically Signed   By: Marijo Conception, M.D.   On: 12/08/2018 07:58    Procedures Procedures (including critical care time)  Medications Ordered in ED Medications - No data to display  Initial Impression / Assessment and Plan / ED Course  I have reviewed the triage vital signs and the nursing notes.  Pertinent labs & imaging results that were available during my care of the patient were reviewed by me and considered in my medical decision making (see chart for details).  HEAR Score: 1  Ms.Moyano is a 38 y.o. female w/ PMH of obesity, hld and GERD presenting with chest and  back pain. Her chest pain is very atypical. EKG is reassuring for non-cardiac pain. Most likely musculoskeletal pain due to frequent coughs. Other differential includes angina, GERD, esophageal spasm. Will get chest X-ray to rule out obvious abnormalities. Offered pregnancy test but she refused testing, Stating that she has contraceptive implant.  Final Clinical Impressions(s) / ED Diagnoses   Final diagnoses:  Atypical chest pain   Chest X-ray shows no acute findings. Will discharge home with return precautions. Discussed with Ms.Cedrone about most likely musculoskeletal etiology for her chest pain. She expressed understanding.  ED Discharge Orders         Ordered    naproxen (NAPROSYN) 500 MG tablet  2 times daily     12/08/18 0806    benzonatate (TESSALON) 100 MG capsule  Every 8 hours     12/08/18 0806           Mosetta Anis, MD 12/08/18 4496    Valarie Merino, MD 12/08/18 [phone]

## 2018-12-08 NOTE — ED Notes (Signed)
Pt states he understands instructions. Home stable with steady gait. 

## 2018-12-14 ENCOUNTER — Emergency Department (HOSPITAL_COMMUNITY): Payer: Medicaid Other

## 2018-12-14 ENCOUNTER — Telehealth: Payer: Self-pay | Admitting: Surgery

## 2018-12-14 ENCOUNTER — Other Ambulatory Visit: Payer: Self-pay

## 2018-12-14 ENCOUNTER — Emergency Department (HOSPITAL_COMMUNITY)
Admission: EM | Admit: 2018-12-14 | Discharge: 2018-12-14 | Disposition: A | Discharge status: Home / Self Care | Payer: Medicaid Other | Attending: Emergency Medicine | Admitting: Emergency Medicine

## 2018-12-14 ENCOUNTER — Encounter (HOSPITAL_COMMUNITY): Payer: Self-pay | Admitting: Pharmacy Technician

## 2018-12-14 DIAGNOSIS — R0789 Other chest pain: Secondary | ICD-10-CM | POA: Diagnosis present

## 2018-12-14 DIAGNOSIS — R091 Pleurisy: Secondary | ICD-10-CM | POA: Diagnosis not present

## 2018-12-14 DIAGNOSIS — E7800 Pure hypercholesterolemia, unspecified: Secondary | ICD-10-CM | POA: Diagnosis not present

## 2018-12-14 DIAGNOSIS — Z79899 Other long term (current) drug therapy: Secondary | ICD-10-CM | POA: Diagnosis not present

## 2018-12-14 LAB — COMPREHENSIVE METABOLIC PANEL
ALK PHOS: 72 U/L (ref 38–126)
ALT: 16 U/L (ref 0–44)
AST: 19 U/L (ref 15–41)
Albumin: 4 g/dL (ref 3.5–5.0)
Anion gap: 10 (ref 5–15)
BILIRUBIN TOTAL: 0.8 mg/dL (ref 0.3–1.2)
BUN: 15 mg/dL (ref 6–20)
CALCIUM: 9.2 mg/dL (ref 8.9–10.3)
CO2: 22 mmol/L (ref 22–32)
Chloride: 107 mmol/L (ref 98–111)
Creatinine, Ser: 0.77 mg/dL (ref 0.44–1.00)
GFR calc Af Amer: >60 mL/min (ref >60)
GFR calc non Af Amer: >60 mL/min (ref >60)
Glucose, Bld: 91 mg/dL (ref 70–99)
Potassium: 3.7 mmol/L (ref 3.5–5.1)
Sodium: 139 mmol/L (ref 135–145)
Total Protein: 7 g/dL (ref 6.5–8.1)

## 2018-12-14 LAB — CBC WITH DIFFERENTIAL/PLATELET
Abs Immature Granulocytes: 0 10*3/uL (ref 0.00–0.07)
Basophils Absolute: 0.1 10*3/uL (ref 0.0–0.1)
Basophils Relative: 1 %
Eosinophils Absolute: 0.4 10*3/uL (ref 0.0–0.5)
Eosinophils Relative: 7 %
HCT: 42.7 % (ref 36.0–46.0)
Hemoglobin: 14.2 g/dL (ref 12.0–15.0)
Immature Granulocytes: 0 %
Lymphocytes Relative: 39 %
Lymphs Abs: 2.2 10*3/uL (ref 0.7–4.0)
MCH: 30.6 pg (ref 26.0–34.0)
MCHC: 33.3 g/dL (ref 30.0–36.0)
MCV: 92 fL (ref 80.0–100.0)
Monocytes Absolute: 0.4 10*3/uL (ref 0.1–1.0)
Monocytes Relative: 8 %
NEUTROS PCT: 45 %
Neutro Abs: 2.6 10*3/uL (ref 1.7–7.7)
Platelets: 299 10*3/uL (ref 150–400)
RBC: 4.64 MIL/uL (ref 3.87–5.11)
RDW: 12.5 % (ref 11.5–15.5)
WBC: 5.6 10*3/uL (ref 4.0–10.5)
nRBC: 0 % (ref 0.0–0.2)

## 2018-12-14 LAB — TROPONIN I: Troponin I: <0.03 ng/mL (ref <0.03)

## 2018-12-14 LAB — D-DIMER, QUANTITATIVE: D-Dimer, Quant: 0.38 ug/mL-FEU (ref 0.00–0.50)

## 2018-12-14 LAB — LIPASE, BLOOD: Lipase: 34 U/L (ref 11–51)

## 2018-12-14 LAB — I-STAT BETA HCG BLOOD, ED (MC, WL, AP ONLY): I-stat hCG, quantitative: <5 m[IU]/mL (ref <5)

## 2018-12-14 MED ORDER — OMEPRAZOLE 20 MG PO CPDR: 20.0000 mg | DELAYED_RELEASE_CAPSULE | Freq: Every day | ORAL | 0 refills | Status: DC

## 2018-12-14 MED ORDER — KETOROLAC TROMETHAMINE 30 MG/ML IJ SOLN
30.0000 mg | Freq: Once | INTRAMUSCULAR | Status: AC
  Administered 2018-12-14: 30 mg via INTRAVENOUS
  Filled 2018-12-14: qty 1

## 2018-12-14 MED ORDER — ETODOLAC 300 MG PO CAPS: 300.0000 mg | ORAL_CAPSULE | Freq: Three times a day (TID) | ORAL | 0 refills | Status: DC

## 2018-12-14 NOTE — ED Notes (Signed)
Pt to xray

## 2018-12-14 NOTE — ED Triage Notes (Signed)
Pt arrives via pov with reports of increased sob. Seen recently and given abx. States she is getting worse and the abx have not helped. Reports pain to L ribcage with inspiration. Denies fevers/cough.

## 2018-12-14 NOTE — Telephone Encounter (Signed)
ED CM received call from patient's spouse regarding prescription not received at patient's pharmacy. Patient is requesting  Prescription to be called into [facility] [address] instead of [facility] [address], ED contacted [facility] was informed the store closes in 10 mins. ED CM attempted to contact patient and spouse at numbers listed in chart unable to reach by phone VM not set up. No further CM needs identified.

## 2018-12-14 NOTE — ED Provider Notes (Signed)
[facility] EMERGENCY DEPARTMENT Provider Note   CSN: [id number] Arrival date & time: 12/14/18  1359    History   Chief Complaint Chief Complaint  Patient presents with  . Shortness of Breath    HPI Michaela Moran is a 38 y.o. female.     Patient is a 38 year old female with a history of GERD and obesity presenting today with chest wall pain.  She describes it as sharp in nature and takes her breath away.  She has been having these symptoms intermittently for the last several weeks.  She states approximately 3 weeks ago she had cough and congestion and was diagnosed with URI.  She was given antibiotics but symptoms did not improve.  She then was seen in the emergency room approximately 6 days ago because she was having shortness of breath and chest pain.  At that time her x-ray and lab work was clear.  Her EKG did not show acute findings.  They diagnosed her with chest pain and prescribed NSAIDs.  Patient states this morning when she woke up the pain was more severe again and it makes her feel short of breath.  She has had no nausea, vomiting, diarrhea, fever.  She states there is been no cough now and that is totally resolved.  The history is provided by the patient.  Shortness of Breath  Severity:  Moderate Onset quality:  Sudden Duration: Intermittently for 2 weeks. Timing:  Intermittent Progression:  Worsening Chronicity:  New Worsened by:  Nothing Associated symptoms: chest pain   Associated symptoms: no cough, no fever, no sputum production, no vomiting and no wheezing   Risk factors: obesity   Risk factors: no hx of PE/DVT, no prolonged immobilization, no recent surgery and no tobacco use   Risk factors comment:  In Feb flew to [facility] and back but no other travel   Past Medical History:  Diagnosis Date  . Acid reflux   . Female circumcision   . Hemorrhoid   . 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 Hypercholesteremia     Patient Active Problem List   Diagnosis Date Noted  . Chronic pain of right knee 06/26/2018  . Encounter for initial prescription of Nexplanon 08/21/2016  . S/P repeat low transverse C-section 06/27/2016  . 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 Previous cesarean delivery, antepartum condition or complication 52/77/8242  . Language barrier, cultural differences 04/28/2016  . Advanced maternal age in multigravida     Past Surgical History:  Procedure Laterality Date  . CESAREAN SECTION    . CESAREAN SECTION N/A 01/02/2013   Procedure: CESAREAN SECTION;  Surgeon: Donnamae Jude, MD;  Location: [facility];  Service: Obstetrics;  Laterality: N/A;  Incidental Cyystotomy  . CESAREAN SECTION N/A 06/26/2016   Procedure: CESAREAN SECTION;  Surgeon: Aletha Halim, MD;  Location: [facility];  Service: Obstetrics;  Laterality: N/A;  . CYSTOTOMY     with repair at 2014 repeat c-section     OB History    Gravida  3   Para  3   Term  3   Preterm      AB      Living  3     SAB      TAB      Ectopic      Multiple  0   Live Births  3  Home Medications    Prior to Admission medications   Medication Sig Start Date End Date Taking? Authorizing Provider  acetaminophen (TYLENOL) 325 MG tablet Take 2 tablets (650 mg total) by mouth every 6 (six) hours as needed. Do not take more than 4000mg  of tylenol per day Patient not taking: Reported on 11/21/2018 07/25/18   Couture, Cortni S, PA-C  benzonatate (TESSALON) 100 MG capsule Take 1 capsule (100 mg total) by mouth every 8 (eight) hours. 12/08/18   Mosetta Anis, MD  cyclobenzaprine (FLEXERIL) 10 MG tablet Take 1 tablet (10 mg total) by mouth 2 (two) times daily as needed for muscle spasms. Patient not taking: Reported on 11/21/2018 06/19/18   Henderly, Britni A, PA-C  Dextromethorphan-Guaifenesin 60-1200 MG 12hr tablet Take 1 tablet by mouth every 12 (twelve) hours. 11/21/18   Langston Masker B, PA-C   diclofenac sodium (VOLTAREN) 1 % GEL Apply 2 g topically 4 (four) times daily. 08/08/18   Argentina Donovan, PA-C  etonogestrel (NEXPLANON) 68 MG IMPL implant 1 each by Subdermal route once. Implanted October 2017    [provider]  hydrocortisone (ANUSOL-HC) 25 MG suppository Place 1 suppository (25 mg total) rectally 2 (two) times daily. Patient not taking: Reported on 11/21/2018 08/08/18   Argentina Donovan, PA-C  ibuprofen (ADVIL,MOTRIN) 600 MG tablet Take 1 tablet (600 mg total) by mouth every 6 (six) hours as needed. 11/21/18   Langston Masker B, PA-C  meloxicam (MOBIC) 7.5 MG tablet Take 1 tablet (7.5 mg total) by mouth 2 (two) times daily. Prn pain Patient not taking: Reported on 11/21/2018 08/08/18   Argentina Donovan, PA-C  naproxen (NAPROSYN) 500 MG tablet Take 1 tablet (500 mg total) by mouth 2 (two) times daily. 12/08/18   Mosetta Anis, MD  omeprazole (PRILOSEC) 20 MG capsule Take 1 capsule (20 mg total) by mouth daily. Patient not taking: Reported on 11/21/2018 10/26/17   Emeline General, PA-C  Vitamin D, Ergocalciferol, (DRISDOL) 1.25 MG (50000 UT) CAPS capsule Take 1 capsule (50,000 Units total) by mouth every 7 (seven) days. 08/10/18   Argentina Donovan, PA-C    Family History Family History  Problem Relation Age of Onset  . Diabetes Mother   . Diabetes Sister   . Diabetes Brother   . Hypertension Brother   . Stroke Maternal Aunt   . Other Neg Hx     Social History Social History   Tobacco Use  . Smoking status: Never Smoker  . Smokeless tobacco: Never Used  Substance Use Topics  . Alcohol use: No  . Drug use: No     Allergies   Other   Review of Systems Review of Systems  Constitutional: Negative for fever.  Respiratory: Positive for shortness of breath. Negative for cough, sputum production and wheezing.   Cardiovascular: Positive for chest pain.  Gastrointestinal: Negative for vomiting.  All other systems reviewed and are negative.    Physical  Exam Updated Vital Signs BP (!) 112/91   Pulse 62   Temp 98.3 F (36.8 C) (Oral)   Resp 12   SpO2 100%   Physical Exam Vitals signs and nursing note reviewed.  Constitutional:      General: She is not in acute distress.    Appearance: She is well-developed.  HENT:     Head: Normocephalic and atraumatic.  Eyes:     Pupils: Pupils are equal, round, and reactive to light.  Cardiovascular:     Rate and Rhythm: Normal rate  and regular rhythm.     Heart sounds: Normal heart sounds. No murmur. No friction rub.  Pulmonary:     Effort: Pulmonary effort is normal.     Breath sounds: Normal breath sounds. No wheezing or rales.  Chest:     Chest wall: Tenderness present. No crepitus.    Abdominal:     General: Bowel sounds are normal. There is no distension.     Palpations: Abdomen is soft.     Tenderness: There is no abdominal tenderness. There is no guarding or rebound.  Musculoskeletal: Normal range of motion.        General: No tenderness.     Right lower leg: No edema.     Left lower leg: No edema.     Comments: No edema  Skin:    General: Skin is warm and dry.     Findings: No rash.  Neurological:     General: No focal deficit present.     Mental Status: She is alert and oriented to person, place, and time. Mental status is at baseline.     Cranial Nerves: No cranial nerve deficit.  Psychiatric:        Mood and Affect: Mood normal.        Behavior: Behavior normal.        Thought Content: Thought content normal.      ED Treatments / Results  Labs (all labs ordered are listed, but only abnormal results are displayed) Labs Reviewed  CBC WITH DIFFERENTIAL/PLATELET  COMPREHENSIVE METABOLIC PANEL  LIPASE, BLOOD  D-DIMER, QUANTITATIVE (NOT AT [facility])  TROPONIN I  I-STAT BETA HCG BLOOD, ED ([facility], [facility], [facility] ONLY)    EKG EKG Interpretation  Date/Time:  Sunday December 14 2018 14:12:19 EDT Ventricular Rate:  72 PR Interval:    QRS Duration: 94 QT Interval:  399 QTC  Calculation: 437 R Axis:   62 Text Interpretation:  Sinus rhythm Low voltage, precordial leads Borderline T abnormalities, anterior leads No significant change since last tracing Confirmed by Blanchie Dessert [phone]) on 12/14/2018 2:33:19 PM   Radiology No results found.  Procedures Procedures (including critical care time)  Medications Ordered in ED Medications  ketorolac (TORADOL) 30 MG/ML injection 30 mg (has no administration in time range)     Initial Impression / Assessment and Plan / ED Course  I have reviewed the triage vital signs and the nursing notes.  Pertinent labs & imaging results that were available during my care of the patient were reviewed by me and considered in my medical decision making (see chart for details).       Patient with minimal medical problems presenting today with atypical chest pain.  It is pleuritic in nature but makes her feel short of breath.  Patient does state that she traveled to [place] at the end of February back her has had no other travel.  She has had no long immobilization and has no unilateral pain.  She is not on OCPs.  She has no prior history of DVT.  Patient vital signs are within normal limits.  She denies any associated symptoms other than shortness of breath with chest pain.  EKG appears unchanged.  Chest x-ray, CBC, CMP, lipase, d-dimer and troponin pending  Final Clinical Impressions(s) / ED Diagnoses   Final diagnoses:  None    ED Discharge Orders    None       Blanchie Dessert, MD 12/14/18 1521

## 2018-12-14 NOTE — Discharge Instructions (Addendum)
Take the medications as prescribed, follow up with your doctor if not improving in the next week

## 2018-12-14 NOTE — ED Notes (Signed)
Transporter here to take pt to xray.

## 2018-12-14 NOTE — ED Provider Notes (Signed)
Patient was initially seen by Dr. Maryan Rued.  Please see her note.  Patient's laboratory tests are all normal.  D-dimer and troponin are negative.  Chest x-ray does not show pneumonia.  Patient is comfortable and breathing easily.  She is not tachypneic or hypoxic.  Low suspicion for acute coronary syndrome, aortic dissection or pulmonary embolism.  She does not have a pneumonia.  Is possible she has some pleuritic or chest wall pain associate with her recent upper respiratory infection.  Michaela Moran was evaluated in Emergency Department on 12/14/2018 for the symptoms described in the history of present illness. She was evaluated in the context of the global COVID-19 pandemic, which necessitated consideration that the patient might be at risk for infection with the SARS-CoV-2 virus that causes COVID-19. Institutional protocols and algorithms that pertain to the evaluation of patients at risk for COVID-19 are in a state of rapid change based on information released by regulatory bodies including the CDC and federal and state organizations. These policies and algorithms were followed during the patient's care in the ED.    Dorie Rank, MD 12/14/18 [phone]

## 2019-01-07 ENCOUNTER — Ambulatory Visit: Payer: Self-pay | Admitting: Internal Medicine

## 2019-01-21 ENCOUNTER — Encounter: Payer: Self-pay | Admitting: Nurse Practitioner

## 2019-01-21 ENCOUNTER — Other Ambulatory Visit: Payer: Self-pay

## 2019-01-21 ENCOUNTER — Ambulatory Visit: Payer: Self-pay | Attending: Nurse Practitioner | Admitting: Nurse Practitioner

## 2019-01-21 DIAGNOSIS — R05 Cough: Secondary | ICD-10-CM

## 2019-01-21 DIAGNOSIS — R053 Chronic cough: Secondary | ICD-10-CM

## 2019-01-21 MED ORDER — CETIRIZINE HCL 10 MG PO TABS: 10.0000 mg | ORAL_TABLET | Freq: Every day | ORAL | 11 refills | Status: DC

## 2019-01-21 MED ORDER — ALBUTEROL SULFATE HFA 108 (90 BASE) MCG/ACT IN AERS: 2.0000 | INHALATION_SPRAY | Freq: Four times a day (QID) | RESPIRATORY_TRACT | 2 refills | Status: DC | PRN

## 2019-01-21 MED ORDER — OMEPRAZOLE 20 MG PO CPDR: 20.0000 mg | DELAYED_RELEASE_CAPSULE | Freq: Every day | ORAL | 1 refills | Status: DC

## 2019-01-21 MED FILL — !VENTOLIN HFA INHALER: 108 (90 BAS | 25 days supply | Qty: 18 | Fill #0

## 2019-01-21 MED FILL — OMEPRAZOLE 20 MG CAP: 20 | 30 days supply | Qty: 30 | Fill #0

## 2019-01-21 NOTE — Progress Notes (Signed)
Virtual Visit via Telephone Note Due to national recommendations of social distancing due to [place] 19, telehealth visit is felt to be most appropriate for this patient at this time.  I discussed the limitations, risks, security and privacy concerns of performing an evaluation and management service by telephone and the availability of in person appointments. I also discussed with the patient that there may be a patient responsible charge related to this service. The patient expressed understanding and agreed to proceed.    I connected with Michaela Moran on 01/21/19  at   9:44 AM EDT  EDT by telephone and verified that I am speaking with the correct person using two identifiers.   Consent I discussed the limitations, risks, security and privacy concerns of performing an evaluation and management service by telephone and the availability of in person appointments. I also discussed with the patient that there may be a patient responsible charge related to this service. The patient expressed understanding and agreed to proceed.   Location of Patient: Private Residence   Location of Provider: [place] participating in Telemedicine visit: Geryl Rankins FNP-BC [place]  Interpreter ID [phone]   History of Present Illness: Telemedicine visit for: Establish care   Cough: Patient complains of nonproductive cough.  Symptoms began several months ago.  The cough is non-productive, without wheezing, dyspnea or hemoptysis, chest is painful during coughing and is aggravated by cold air and dust Associated symptoms include:abdominal pain. Patient does not have new pets. Patient does not have a history of asthma. Patient does not have a history of environmental allergens. Patient traveled to [place] in March. Patient does not have a history of smoking. Patient  had previous Chest X-ray in March which was negative. Patient has not had a PPD done. She was  seen in the ED in March 3 times for pleurisy, atypical chest pain and viral URI with cough. She has been prescribed PPI, cough syrup (supressant) as well as Tessalon which she states nothing has helped. She states she took them for a week or two with no improvement and then stopped.    Past Medical History:  Diagnosis Date  . Acid reflux   . Female circumcision   . Hemorrhoid   . 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 Hypercholesteremia     Past Surgical History:  Procedure Laterality Date  . CESAREAN SECTION    . CESAREAN SECTION N/A 01/02/2013   Procedure: CESAREAN SECTION;  Surgeon: Donnamae Jude, MD;  Location: [facility];  Service: Obstetrics;  Laterality: N/A;  Incidental Cyystotomy  . CESAREAN SECTION N/A 06/26/2016   Procedure: CESAREAN SECTION;  Surgeon: Aletha Halim, MD;  Location: [facility];  Service: Obstetrics;  Laterality: N/A;  . CYSTOTOMY     with repair at 2014 repeat c-section    Family History  Problem Relation Age of Onset  . Diabetes Mother   . Diabetes Sister   . Diabetes Brother   . Hypertension Brother   . Stroke Maternal Aunt   . Other Neg Hx     Social History   Socioeconomic History  . Marital status: Married    Spouse name: Not on file  . Number of children: Not on file  . Years of education: Not on file  . Highest education level: Not on file  Occupational History  . Not on file  Social Needs  . Financial resource strain: Not on  file  . Food insecurity:    Worry: Not on file    Inability: Not on file  . Transportation needs:    Medical: Not on file    Non-medical: Not on file  Tobacco Use  . Smoking status: Never Smoker  . Smokeless tobacco: Never Used  Substance and Sexual Activity  . Alcohol use: No  . Drug use: No  . Sexual activity: Yes    Birth control/protection: Implant  Lifestyle  . Physical activity:    Days per week: Not on file    Minutes per session: Not on file  . Stress: Not on file  Relationships  .  Social connections:    Talks on phone: Not on file    Gets together: Not on file    Attends religious service: Not on file    Active member of club or organization: Not on file    Attends meetings of clubs or organizations: Not on file    Relationship status: Not on file  Other Topics Concern  . Not on file  Social History Narrative  . Not on file     Observations/Objective: Awake, alert and oriented x 3   Review of Systems  Constitutional: Negative for fever, malaise/fatigue and weight loss.  HENT: Negative.  Negative for nosebleeds.   Eyes: Negative.  Negative for blurred vision, double vision and photophobia.  Respiratory: Positive for cough (non productive). Negative for shortness of breath.   Cardiovascular: Negative.  Negative for chest pain, palpitations and leg swelling.  Gastrointestinal: Positive for abdominal pain. Negative for blood in stool, constipation, diarrhea, heartburn, melena, nausea and vomiting.  Genitourinary: Negative.  Negative for dysuria and hematuria.  Musculoskeletal: Negative.  Negative for myalgias.  Neurological: Negative.  Negative for dizziness, focal weakness, seizures and headaches.  Psychiatric/Behavioral: Negative.  Negative for suicidal ideas.    Assessment and Plan:  Diagnoses and all orders for this visit:  Chronic cough -     omeprazole (PRILOSEC) 20 MG capsule; Take 1 capsule (20 mg total) by mouth daily for 30 days. -     cetirizine (ZYRTEC) 10 MG tablet; Take 1 tablet (10 mg total) by mouth at bedtime for 30 days. -     albuterol (VENTOLIN HFA) 108 (90 Base) MCG/ACT inhaler; Inhale 2 puffs into the lungs every 6 (six) hours as needed for wheezing or shortness of breath. Possibly related to allergies or GERD. Will treat for both.     Follow Up Instructions Return in about 4 weeks (around 02/18/2019) for cough,  .  If persistent may need PPD.    I discussed the assessment and treatment plan with the patient. The patient was provided  an opportunity to ask questions and all were answered. The patient agreed with the plan and demonstrated an understanding of the instructions.   The patient was advised to call back or seek an in-person evaluation if the symptoms worsen or if the condition fails to improve as anticipated.  I provided 35 minutes of non-face-to-face time during this encounter including median intraservice time, reviewing previous notes, labs, imaging, medications and explaining diagnosis and management.  Gildardo Pounds, FNP-BC

## 2019-02-18 ENCOUNTER — Ambulatory Visit: Payer: Self-pay | Admitting: Nurse Practitioner

## 2019-02-20 ENCOUNTER — Ambulatory Visit: Payer: Self-pay | Admitting: Internal Medicine

## 2019-02-23 ENCOUNTER — Telehealth: Payer: Self-pay | Admitting: Internal Medicine

## 2019-02-23 ENCOUNTER — Telehealth: Payer: Self-pay | Admitting: Nurse Practitioner

## 2019-02-23 NOTE — Telephone Encounter (Signed)
Spoke to Pt. Per Sande Rives - This patient can be seen at [facility]. Since their temp card expires 6/22, the best thing for them to do would be to call Clifton James at [facility] [phone]) and have them make an enrollment appointment. [place] will also mail them an application for [facility] today.   Pt. Informed and understood.   She states she spoke to [place] this morning and she already has a follow up appointment scheduled for 03/02/2019 at 1:30 PM

## 2019-02-23 NOTE — Telephone Encounter (Signed)
I called Pt, since she call asking about her application that she submitted on 03/20, she was inform that at the time she has not balance or appt . An we can hold it only for 30 days, at this time she need to resubmitted a new application with all new documents and set up an appt with the financial dept.Pt understood

## 2019-02-24 ENCOUNTER — Other Ambulatory Visit: Payer: Self-pay

## 2019-02-24 ENCOUNTER — Ambulatory Visit (INDEPENDENT_AMBULATORY_CARE_PROVIDER_SITE_OTHER): Payer: Self-pay | Admitting: Orthopaedic Surgery

## 2019-02-24 ENCOUNTER — Encounter: Payer: Self-pay | Admitting: Orthopaedic Surgery

## 2019-02-24 ENCOUNTER — Ambulatory Visit (INDEPENDENT_AMBULATORY_CARE_PROVIDER_SITE_OTHER): Payer: Self-pay

## 2019-02-24 DIAGNOSIS — M25561 Pain in right knee: Secondary | ICD-10-CM

## 2019-02-24 DIAGNOSIS — G8929 Other chronic pain: Secondary | ICD-10-CM

## 2019-02-24 MED ORDER — BUPIVACAINE HCL 0.25 % IJ SOLN
2.0000 mL | INTRAMUSCULAR | Status: AC | PRN
  Administered 2019-02-24: 2 mL via INTRA_ARTICULAR

## 2019-02-24 MED ORDER — DICLOFENAC SODIUM 1 % TD GEL: 2.0000 g | Freq: Four times a day (QID) | TRANSDERMAL | 2 refills | Status: DC

## 2019-02-24 MED ORDER — LIDOCAINE HCL 1 % IJ SOLN
2.0000 mL | INTRAMUSCULAR | Status: AC | PRN
  Administered 2019-02-24: 2 mL

## 2019-02-24 MED ORDER — METHYLPREDNISOLONE ACETATE 40 MG/ML IJ SUSP
40.0000 mg | INTRAMUSCULAR | Status: AC | PRN
  Administered 2019-02-24: 40 mg via INTRA_ARTICULAR

## 2019-02-24 MED FILL — DICLOFENAC SODIUM 1% GEL: 1 | 12 days supply | Qty: 100 | Fill #0

## 2019-02-24 NOTE — Progress Notes (Signed)
Office Visit Note   [name]           Date of Birth: Dec 25, 1980           MRN: [id number] Visit Date: 02/24/2019              Requested by: No referring provider defined for this encounter. PCP: Gildardo Pounds, NP   Assessment & Plan: Visit Diagnoses:  1. Chronic pain of right knee     Plan: Impression is recurrent right knee pain.  Because the patient responded so well to the injection in the fall, we will repeat the cortisone injection today.  She will follow-up with [place] as needed.Of note, this encounter is being discussed through a [place] video interpreter.   Follow-Up Instructions: Return if symptoms worsen or fail to improve.   Orders:  Orders Placed This Encounter  Procedures  . Large Joint Inj: R knee  . XR KNEE 3 VIEW RIGHT   Meds ordered this encounter  Medications  . diclofenac sodium (VOLTAREN) 1 % GEL    Sig: Apply 2 g topically 4 (four) times daily.    Dispense:  1 Tube    Refill:  2      Procedures: Large Joint Inj: R knee on 02/24/2019 3:24 PM Indications: pain Details: 22 G needle, anterolateral approach Medications: 2 mL bupivacaine 0.25 %; 2 mL lidocaine 1 %; 40 mg methylPREDNISolone acetate 40 MG/ML      Clinical Data: No additional findings.   Subjective: Chief Complaint  Patient presents with  . Right Knee - Pain    HPI patient is a pleasant 38 year old female who presents our clinic today with recurrent right knee pain.  she was seen in our office in November 2019 for this.  Cortisone injection performed which gave her significant relief of symptoms until about a week ago.  She notes that they increased her workload a little over a week ago which she thinks is contributed to the increased pain.  Her pain has worsened over the past few days.  The pain she has is to the entire knee and occasionally radiates up the back of her leg and into her buttocks.  She does note that she is walking with an antalgic gait.  She has pain with  activity as well as at night when she is sleeping.  She has tried over-the-counter medications without relief of symptoms.  She denies any weakness or numbness, tingling or burning to either lower extremity.   Review of Systems as detailed in HPI.  All others reviewed and are negative.   Objective: Vital Signs: There were no vitals taken for this visit.  Physical Exam well-developed well-nourished female no acute distress.  Alert and oriented x3.  Ortho Exam examination of the right knee shows no effusion.  Range of motion 0 to 120 degrees.  Medial lateral joint line tenderness.  Very minimal patellofemoral crepitus.  Cruciates and collaterals are stable.  Minimally positive straight leg raise.  She is neurovascularly intact distally.  Specialty Comments:  No specialty comments available.  Imaging: Xr Knee 3 View Right  Result Date: 02/24/2019 No acute or structural abnormalities    PMFS History: Patient Active Problem List   Diagnosis Date Noted  . Chronic pain of right knee 06/26/2018  . Encounter for initial prescription of Nexplanon 08/21/2016  . S/P repeat low transverse C-section 06/27/2016  . 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 Previous cesarean delivery,  antepartum condition or complication 57/97/2820  . Language barrier, cultural differences 04/28/2016  . Advanced maternal age in multigravida    Past Medical History:  Diagnosis Date  . Acid reflux   . Female circumcision   . Hemorrhoid   . 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 Hypercholesteremia     Family History  Problem Relation Age of Onset  . Diabetes Mother   . Diabetes Sister   . Diabetes Brother   . Hypertension Brother   . Stroke Maternal Aunt   . Other Neg Hx     Past Surgical History:  Procedure Laterality Date  . CESAREAN SECTION    . CESAREAN SECTION N/A 01/02/2013   Procedure: CESAREAN SECTION;  Surgeon: Donnamae Jude, MD;   Location: [facility];  Service: Obstetrics;  Laterality: N/A;  Incidental Cyystotomy  . CESAREAN SECTION N/A 06/26/2016   Procedure: CESAREAN SECTION;  Surgeon: Aletha Halim, MD;  Location: [facility];  Service: Obstetrics;  Laterality: N/A;  . CYSTOTOMY     with repair at 2014 repeat c-section   Social History   Occupational History  . Not on file  Tobacco Use  . Smoking status: Never Smoker  . Smokeless tobacco: Never Used  Substance and Sexual Activity  . Alcohol use: No  . Drug use: No  . Sexual activity: Yes    Birth control/protection: Implant

## 2019-03-02 ENCOUNTER — Ambulatory Visit: Payer: Medicaid Other | Attending: Nurse Practitioner | Admitting: Nurse Practitioner

## 2019-03-02 ENCOUNTER — Other Ambulatory Visit: Payer: Self-pay

## 2019-03-09 ENCOUNTER — Telehealth: Payer: Self-pay | Admitting: Nurse Practitioner

## 2019-03-09 NOTE — Telephone Encounter (Signed)
I called back Pt, LVM informed her that she has insurance at this time for this reason she can not qualified for OC or CAFA

## 2019-03-16 ENCOUNTER — Other Ambulatory Visit: Payer: Self-pay

## 2019-03-16 ENCOUNTER — Encounter: Payer: Self-pay | Admitting: Nurse Practitioner

## 2019-03-16 ENCOUNTER — Ambulatory Visit: Payer: Medicaid Other | Attending: Nurse Practitioner | Admitting: Nurse Practitioner

## 2019-03-16 DIAGNOSIS — R0789 Other chest pain: Secondary | ICD-10-CM | POA: Diagnosis not present

## 2019-03-16 DIAGNOSIS — K089 Disorder of teeth and supporting structures, unspecified: Secondary | ICD-10-CM | POA: Diagnosis not present

## 2019-03-16 DIAGNOSIS — E7800 Pure hypercholesterolemia, unspecified: Secondary | ICD-10-CM | POA: Insufficient documentation

## 2019-03-16 DIAGNOSIS — K029 Dental caries, unspecified: Secondary | ICD-10-CM | POA: Diagnosis not present

## 2019-03-16 DIAGNOSIS — Z8249 Family history of ischemic heart disease and other diseases of the circulatory system: Secondary | ICD-10-CM | POA: Insufficient documentation

## 2019-03-16 DIAGNOSIS — K219 Gastro-esophageal reflux disease without esophagitis: Secondary | ICD-10-CM | POA: Insufficient documentation

## 2019-03-16 MED ORDER — CETIRIZINE HCL 10 MG PO TABS: 10.0000 mg | ORAL_TABLET | Freq: Every day | ORAL | 2 refills | Status: DC

## 2019-03-16 MED ORDER — ALBUTEROL SULFATE HFA 108 (90 BASE) MCG/ACT IN AERS: 2.0000 | INHALATION_SPRAY | Freq: Four times a day (QID) | RESPIRATORY_TRACT | 1 refills | Status: DC | PRN

## 2019-03-16 MED ORDER — OMEPRAZOLE 20 MG PO CPDR: 20.0000 mg | DELAYED_RELEASE_CAPSULE | Freq: Every day | ORAL | 1 refills | Status: DC

## 2019-03-16 NOTE — Progress Notes (Signed)
Virtual Visit via Telephone Note Due to national recommendations of social distancing due to [place] 19, telehealth visit is felt to be most appropriate for this patient at this time.  I discussed the limitations, risks, security and privacy concerns of performing an evaluation and management service by telephone and the availability of in person appointments. I also discussed with the patient that there may be a patient responsible charge related to this service. The patient expressed understanding and agreed to proceed.    I connected with Michaela Moran on 03/16/19  at   3:10 PM EDT  EDT by telephone and verified that I am speaking with the correct person using two identifiers.   Consent I discussed the limitations, risks, security and privacy concerns of performing an evaluation and management service by telephone and the availability of in person appointments. I also discussed with the patient that there may be a patient responsible charge related to this service. The patient expressed understanding and agreed to proceed.   Location of Patient: Private Residence   Location of Provider: [place] participating in Telemedicine visit: Geryl Rankins FNP-BC [place]  Interpreter ID Sophia [phone]   History of Present Illness: Telemedicine visit for: F/U to cough. She had complaints of chronic non productive cough. She was prescribed Omeprazole at her last appointment with me in May and states her cough has significantly improved.   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 The chest tightness goes away if she puts on a coat or sweater. I have instructed her to take her inhaler with her to work (she has been leaving it at home) and use it when she experiences these symptoms. The chest tightness is not present any other time and only aggravated by cold air.     Past Medical  History:  Diagnosis Date  . Acid reflux   . Female circumcision   . Hemorrhoid   . 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 Hypercholesteremia     Past Surgical History:  Procedure Laterality Date  . CESAREAN SECTION    . CESAREAN SECTION N/A 01/02/2013   Procedure: CESAREAN SECTION;  Surgeon: Donnamae Jude, MD;  Location: [facility];  Service: Obstetrics;  Laterality: N/A;  Incidental Cyystotomy  . CESAREAN SECTION N/A 06/26/2016   Procedure: CESAREAN SECTION;  Surgeon: Aletha Halim, MD;  Location: [facility];  Service: Obstetrics;  Laterality: N/A;  . CYSTOTOMY     with repair at 2014 repeat c-section    Family History  Problem Relation Age of Onset  . Diabetes Mother   . Diabetes Sister   . Diabetes Brother   . Hypertension Brother   . Stroke Maternal Aunt   . Other Neg Hx     Social History   Socioeconomic History  . Marital status: Married    Spouse name: Not on file  . Number of children: Not on file  . Years of education: Not on file  . Highest education level: Not on file  Occupational History  . Not on file  Social Needs  . Financial resource strain: Not on file  . Food insecurity    Worry: Not on file    Inability: Not on file  . Transportation needs    Medical: Not on file    Non-medical: Not on file  Tobacco Use  . Smoking status:  Never Smoker  . Smokeless tobacco: Never Used  Substance and Sexual Activity  . Alcohol use: No  . Drug use: No  . Sexual activity: Yes    Birth control/protection: Implant  Lifestyle  . Physical activity    Days per week: Not on file    Minutes per session: Not on file  . Stress: Not on file  Relationships  . Social Herbalist on phone: Not on file    Gets together: Not on file    Attends religious service: Not on file    Active member of club or organization: Not on file    Attends meetings of clubs or organizations: Not on file    Relationship status: Not on file  Other Topics Concern  . Not on  file  Social History Narrative  . Not on file     Observations/Objective: Awake, alert and oriented x 3   Review of Systems  Constitutional: Negative for fever, malaise/fatigue and weight loss.  HENT: Negative.  Negative for nosebleeds.   Eyes: Negative.  Negative for blurred vision, double vision and photophobia.  Respiratory: Negative.  Negative for cough, shortness of breath and wheezing.   Cardiovascular: Negative for chest pain, palpitations and leg swelling.       SEE HPI  Gastrointestinal: Negative.  Negative for heartburn, nausea and vomiting.  Musculoskeletal: Negative.  Negative for myalgias.  Neurological: Negative.  Negative for dizziness, focal weakness, seizures and headaches.  Psychiatric/Behavioral: Negative.  Negative for suicidal ideas.    Assessment and Plan: Michaela Moran was seen today for follow-up and referral.  Diagnoses and all orders for this visit:  Sensation of chest tightness -     cetirizine (ZYRTEC) 10 MG tablet; Take 1 tablet (10 mg total) by mouth at bedtime. Please MAIL -     albuterol (VENTOLIN HFA) 108 (90 Base) MCG/ACT inhaler; Inhale 2 puffs into the lungs every 6 (six) hours as needed for up to 30 days for wheezing or shortness of breath. Please MAIL  Gastroesophageal reflux disease, esophagitis presence not specified -     omeprazole (PRILOSEC) 20 MG capsule; Take 1 capsule (20 mg total) by mouth daily. INSTRUCTIONS: Avoid GERD Triggers: acidic, spicy or fried foods, caffeine, coffee, sodas,  alcohol and chocolate.    Poor dentition -     Ambulatory referral to Dentistry     Follow Up Instructions Return in about 3 months (around 06/16/2019).     I discussed the assessment and treatment plan with the patient. The patient was provided an opportunity to ask questions and all were answered. The patient agreed with the plan and demonstrated an understanding of the instructions.   The patient was advised to call back or seek an in-person  evaluation if the symptoms worsen or if the condition fails to improve as anticipated.  I provided 24 minutes of non-face-to-face time during this encounter including median intraservice time, reviewing previous notes, labs, imaging, medications and explaining diagnosis and management.  Gildardo Pounds, FNP-BC

## 2019-03-17 ENCOUNTER — Other Ambulatory Visit: Payer: Self-pay | Admitting: Pharmacist

## 2019-03-17 MED ORDER — ALBUTEROL SULFATE HFA 108 (90 BASE) MCG/ACT IN AERS: 2.0000 | INHALATION_SPRAY | Freq: Four times a day (QID) | RESPIRATORY_TRACT | 2 refills | Status: DC | PRN

## 2019-03-17 MED FILL — OMEPRAZOLE 20 MG CAP: 20 | 30 days supply | Qty: 30 | Fill #0

## 2019-03-17 MED FILL — CETIRIZINE HCL 10 MG TABS: 10 | 30 days supply | Qty: 30 | Fill #0

## 2019-03-17 MED FILL — ALBUTEROL SULFATE HFA 108 (: 108 (90 BAS | 25 days supply | Qty: 9 | Fill #0

## 2019-04-06 MED FILL — OMEPRAZOLE 20 MG CAP: 20 | 30 days supply | Qty: 30 | Fill #0

## 2019-04-06 MED FILL — DICLOFENAC SODIUM 1% GEL: 1 | 12 days supply | Qty: 100 | Fill #1

## 2019-04-06 MED FILL — CETIRIZINE HCL 10 MG TABS: 10 | 30 days supply | Qty: 30 | Fill #0

## 2019-04-15 ENCOUNTER — Encounter (HOSPITAL_COMMUNITY): Payer: Self-pay

## 2019-04-15 ENCOUNTER — Ambulatory Visit (HOSPITAL_COMMUNITY)
Admission: EM | Admit: 2019-04-15 | Discharge: 2019-04-15 | Disposition: A | Discharge status: Home / Self Care | Payer: Medicaid Other | Attending: Urgent Care | Admitting: Urgent Care

## 2019-04-15 ENCOUNTER — Telehealth: Payer: Self-pay | Admitting: Nurse Practitioner

## 2019-04-15 ENCOUNTER — Other Ambulatory Visit: Payer: Self-pay

## 2019-04-15 DIAGNOSIS — M25561 Pain in right knee: Secondary | ICD-10-CM

## 2019-04-15 DIAGNOSIS — M545 Low back pain, unspecified: Secondary | ICD-10-CM

## 2019-04-15 MED ORDER — CYCLOBENZAPRINE HCL 5 MG PO TABS: 5.0000 mg | ORAL_TABLET | Freq: Every evening | ORAL | 0 refills | Status: DC | PRN

## 2019-04-15 MED ORDER — MELOXICAM 15 MG PO TABS: 7.5000 mg | ORAL_TABLET | Freq: Every day | ORAL | 0 refills | Status: DC

## 2019-04-15 NOTE — Telephone Encounter (Signed)
Patient called stating that she has not been feeling good and has been experiencing headache and leg numbing. Advised to go to the ED or UC if symptoms worse or needs to be seen sooner than scheduled apt.

## 2019-04-15 NOTE — ED Provider Notes (Signed)
MRN: [id number] DOB: 08/03/1981  Subjective:   Michaela Moran is a 38 y.o. female presenting for 2-week history of recurrent right knee pain.  Patient states that her knee pain has been ongoing for more than a year.  She has an orthopedist and has had multiple knee x-rays.  Most recent does not show any degenerative changes, acute changes.  She has received steroid injections and have been hit or miss for the patient.  She last saw her orthopedist in June and received a steroid injection again with relief for about a week and a half.  She states that her knee pain is very uncomfortable and is caused her to walk in a different way and now her knee pain has caused her back pain.  However, she admits that her knee pain is more significant.  She did try diclofenac gel as prescribed by her orthopedist but states that it did not help last night.  She still works in the same job, reports that is very strenuous and she does not want to work there anymore but has to to continue helping her husband.  No current facility-administered medications for this encounter.   Current Outpatient Medications:  .  albuterol (PROAIR HFA) 108 (90 Base) MCG/ACT inhaler, Inhale 2 puffs into the lungs every 6 (six) hours as needed for wheezing or shortness of breath., Disp: 8.5 g, Rfl: 2 .  cetirizine (ZYRTEC) 10 MG tablet, Take 1 tablet (10 mg total) by mouth at bedtime. Please MAIL, Disp: 90 tablet, Rfl: 2 .  diclofenac sodium (VOLTAREN) 1 % GEL, Apply 2 g topically 4 (four) times daily., Disp: 1 Tube, Rfl: 2 .  etonogestrel (NEXPLANON) 68 MG IMPL implant, 1 each by Subdermal route once. Implanted October 2017, Disp: , Rfl:  .  omeprazole (PRILOSEC) 20 MG capsule, Take 1 capsule (20 mg total) by mouth daily., Disp: 90 capsule, Rfl: 1 .  Vitamin D, Ergocalciferol, (DRISDOL) 1.25 MG (50000 UT) CAPS capsule, Take 1 capsule (50,000 Units total) by mouth every 7 (seven) days. (Patient not taking: Reported on 12/14/2018), Disp: 16 capsule,  Rfl: 0   Allergies  Allergen Reactions  . Other Swelling    Allergic to peas.    Past Medical History:  Diagnosis Date  . Acid reflux   . Female circumcision   . Hemorrhoid   . 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 Hypercholesteremia      Past Surgical History:  Procedure Laterality Date  . CESAREAN SECTION    . CESAREAN SECTION N/A 01/02/2013   Procedure: CESAREAN SECTION;  Surgeon: Donnamae Jude, MD;  Location: [facility];  Service: Obstetrics;  Laterality: N/A;  Incidental Cyystotomy  . CESAREAN SECTION N/A 06/26/2016   Procedure: CESAREAN SECTION;  Surgeon: Aletha Halim, MD;  Location: [facility];  Service: Obstetrics;  Laterality: N/A;  . CYSTOTOMY     with repair at 2014 repeat c-section    ROS  Objective:   Vitals: BP 126/86 (BP Location: Right Arm)   Pulse 65   Temp 98.9 F (37.2 C) (Oral)   Resp 18   Wt 172 lb (78 kg)   SpO2 100%   BMI 27.76 kg/m   Physical Exam Constitutional:      General: She is not in acute distress.    Appearance: Normal appearance. She is well-developed. She is not ill-appearing.  HENT:     Head: Normocephalic and atraumatic.     Nose: Nose normal.     Mouth/Throat:  Mouth: Mucous membranes are moist.     Pharynx: Oropharynx is clear.  Eyes:     General: No scleral icterus.    Extraocular Movements: Extraocular movements intact.     Pupils: Pupils are equal, round, and reactive to light.  Cardiovascular:     Rate and Rhythm: Normal rate.  Pulmonary:     Effort: Pulmonary effort is normal.  Musculoskeletal:     Right knee: She exhibits decreased range of motion and bony tenderness. She exhibits no swelling, no effusion, no ecchymosis, no deformity, no erythema, normal alignment, normal patellar mobility and normal meniscus. Tenderness found. Medial joint line, lateral joint line and patellar tendon tenderness noted.  Skin:    General: Skin is warm and dry.  Neurological:     General: No focal deficit present.      Mental Status: She is alert and oriented to person, place, and time.  Psychiatric:        Mood and Affect: Mood normal.        Behavior: Behavior normal.     Assessment and Plan :   1. Acute pain of right knee   2. Acute bilateral low back pain without sciatica     Suspect inflammatory process secondary to overuse and over burdening her knee from her work.  Recommended patient try meloxicam and Flexeril for both her knee pain and her back.  Recommended patient continue to hydrate well and rest from work.  Provide her with information to her orthopedist for recheck.  We will hold off on using more steroids, defer to her Ortho physician.  Counseled patient on potential for adverse effects with medications prescribed/recommended today, ER and return-to-clinic precautions discussed, patient verbalized understanding.    Jaynee Eagles, [place] 04/15/19 [id number]

## 2019-04-15 NOTE — Discharge Instructions (Addendum)
After you work each day, apply ice to your knees for 20 minutes to help with knee pain that comes from your work.  You can use cyclobenzaprine (Flexeril) at bedtime to help as a muscle relaxant to ease the tension of your muscles.  You can use meloxicam (Mobic) for knee pain and back pain and general inflammation.  Try 1/2 tablet to see if it helps but if it does not then take a full tablet.  I do want to emphasize need to find a different line of work that is not a strenuous on your body.  I know this is difficult but I do believe it is a source of your persistent joint pains.  Please make sure that you contact the bone doctor as I provided the information and your visit summary.

## 2019-04-15 NOTE — ED Triage Notes (Signed)
Pt states she has pain in her lower back Sciatic nerve pain radiating down her right leg. This has been going on for a while.

## 2019-04-16 NOTE — Telephone Encounter (Signed)
Pt was seen in the ED today. 

## 2019-04-21 NOTE — Progress Notes (Deleted)
Patient ID: Michaela Moran, female   DOB: January 09, 1981, 38 y.o.   MRN: [id number]  Virtual Visit via Telephone Note  I connected with Michaela Moran on 04/22/19 at  2:50 PM EDT by telephone and verified that I am speaking with the correct person using two identifiers.   I discussed the limitations, risks, security and privacy concerns of performing an evaluation and management service by telephone and the availability of in person appointments. I also discussed with the patient that there may be a patient responsible charge related to this service. The patient expressed understanding and agreed to proceed.   Patient location:  home My Location:  [place] office Persons on the call:  Me, the interpreter(  History of Present Illness:    Observations/Objective:   Assessment and Plan:   Follow Up Instructions:    I discussed the assessment and treatment plan with the patient. The patient was provided an opportunity to ask questions and all were answered. The patient agreed with the plan and demonstrated an understanding of the instructions.   The patient was advised to call back or seek an in-person evaluation if the symptoms worsen or if the condition fails to improve as anticipated.  I provided *** minutes of non-face-to-face time during this encounter.   Freeman Caldron, PA-C

## 2019-04-22 ENCOUNTER — Ambulatory Visit: Payer: Medicaid Other | Attending: Family Medicine | Admitting: Physician Assistant

## 2019-04-22 ENCOUNTER — Other Ambulatory Visit: Payer: Self-pay

## 2019-04-28 ENCOUNTER — Ambulatory Visit (INDEPENDENT_AMBULATORY_CARE_PROVIDER_SITE_OTHER): Payer: Medicaid Other | Admitting: Orthopaedic Surgery

## 2019-04-28 ENCOUNTER — Encounter: Payer: Self-pay | Admitting: Orthopaedic Surgery

## 2019-04-28 DIAGNOSIS — M5416 Radiculopathy, lumbar region: Secondary | ICD-10-CM

## 2019-04-28 MED ORDER — TRAMADOL HCL 50 MG PO TABS: 50.0000 mg | ORAL_TABLET | Freq: Three times a day (TID) | ORAL | 2 refills | Status: DC | PRN

## 2019-04-28 NOTE — Addendum Note (Signed)
Addended byLaurann Montana on: 04/28/2019 04:30 PM   Modules accepted: Orders

## 2019-04-28 NOTE — Progress Notes (Signed)
Office Visit Note   [name]           Date of Birth: 1980-11-21           MRN: [id number] Visit Date: 04/28/2019              Requested by: Gildardo Pounds, NP [place],  [place] [address] PCP: Gildardo Pounds, NP   Assessment & Plan: Visit Diagnoses:  1. Lumbar radiculopathy     Plan: Impression is lumbar radiculopathy.  We will obtain an MRI to rule out structural abnormalities.  Tramadol was sent into the pharmacy.  Follow-up after the MRI.  Work note was given for today.  Follow-Up Instructions: Return in about 2 weeks (around 05/12/2019).   Orders:  No orders of the defined types were placed in this encounter.  Meds ordered this encounter  Medications  . traMADol (ULTRAM) 50 MG tablet    Sig: Take 1-2 tablets (50-100 mg total) by mouth 3 (three) times daily as needed.    Dispense:  30 tablet    Refill:  2      Procedures: No procedures performed   Clinical Data: No additional findings.   Subjective: Chief Complaint  Patient presents with  . Right Leg - Pain    Patient comes in today for severe right lower extremity pain.  She states that the pain is not in her knee but throughout her right leg and in her low back.  She went to the urgent care recently for this.  She cannot sleep at night due to the pain.  She is not able to walk or work.  Denies any bowel bladder dysfunction or incontinence.   Review of Systems  Constitutional: Negative.   HENT: Negative.   Eyes: Negative.   Respiratory: Negative.   Cardiovascular: Negative.   Endocrine: Negative.   Musculoskeletal: Negative.   Neurological: Negative.   Hematological: Negative.   Psychiatric/Behavioral: Negative.   All other systems reviewed and are negative.    Objective: Vital Signs: There were no vitals taken for this visit.  Physical Exam Vitals signs and nursing note reviewed.  Constitutional:      Appearance: She is well-developed.  Pulmonary:     Effort:  Pulmonary effort is normal.  Skin:    General: Skin is warm.     Capillary Refill: Capillary refill takes less than 2 seconds.  Neurological:     Mental Status: She is alert and oriented to person, place, and time.  Psychiatric:        Behavior: Behavior normal.        Thought Content: Thought content normal.        Judgment: Judgment normal.     Ortho Exam Right lower extremity exam shows positive sciatic tension sign.  No focal motor or sensory deficits other than some slight weakness due to pain. Specialty Comments:  No specialty comments available.  Imaging: No results found.   PMFS History: Patient Active Problem List   Diagnosis Date Noted  . Chronic pain of right knee 06/26/2018  . Encounter for initial prescription of Nexplanon 08/21/2016  . S/P repeat low transverse C-section 06/27/2016  . 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 Previous cesarean delivery, antepartum condition or complication 19/14/7829  . Language barrier, cultural differences 04/28/2016  . Advanced maternal age in multigravida    Past Medical History:  Diagnosis Date  . Acid reflux   . Female  circumcision   . Hemorrhoid   . 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 Hypercholesteremia     Family History  Problem Relation Age of Onset  . Diabetes Mother   . Diabetes Sister   . Diabetes Brother   . Hypertension Brother   . Stroke Maternal Aunt   . Other Neg Hx     Past Surgical History:  Procedure Laterality Date  . CESAREAN SECTION    . CESAREAN SECTION N/A 01/02/2013   Procedure: CESAREAN SECTION;  Surgeon: Donnamae Jude, MD;  Location: [facility];  Service: Obstetrics;  Laterality: N/A;  Incidental Cyystotomy  . CESAREAN SECTION N/A 06/26/2016   Procedure: CESAREAN SECTION;  Surgeon: Aletha Halim, MD;  Location: [facility];  Service: Obstetrics;  Laterality: N/A;  . CYSTOTOMY     with repair at 2014 repeat c-section    Social History   Occupational History  . Not on file  Tobacco Use  . Smoking status: Never Smoker  . Smokeless tobacco: Never Used  Substance and Sexual Activity  . Alcohol use: No  . Drug use: No  . Sexual activity: Yes    Birth control/protection: Implant

## 2019-05-05 ENCOUNTER — Other Ambulatory Visit: Payer: Self-pay

## 2019-05-05 ENCOUNTER — Encounter: Payer: Self-pay | Admitting: Orthopaedic Surgery

## 2019-05-05 ENCOUNTER — Ambulatory Visit (INDEPENDENT_AMBULATORY_CARE_PROVIDER_SITE_OTHER): Payer: Medicaid Other | Admitting: Orthopaedic Surgery

## 2019-05-05 DIAGNOSIS — M25561 Pain in right knee: Secondary | ICD-10-CM | POA: Diagnosis not present

## 2019-05-05 DIAGNOSIS — M25461 Effusion, right knee: Secondary | ICD-10-CM

## 2019-05-05 DIAGNOSIS — G8929 Other chronic pain: Secondary | ICD-10-CM | POA: Diagnosis not present

## 2019-05-05 MED ORDER — LIDOCAINE HCL 1 % IJ SOLN
2.0000 mL | INTRAMUSCULAR | Status: AC | PRN
  Administered 2019-05-05: 2 mL

## 2019-05-05 MED ORDER — BUPIVACAINE HCL 0.5 % IJ SOLN
2.0000 mL | INTRAMUSCULAR | Status: AC | PRN
  Administered 2019-05-05: 21:00:00 2 mL via INTRA_ARTICULAR

## 2019-05-05 MED ORDER — METHYLPREDNISOLONE ACETATE 40 MG/ML IJ SUSP
40.0000 mg | INTRAMUSCULAR | Status: AC | PRN
  Administered 2019-05-05: 40 mg via INTRA_ARTICULAR

## 2019-05-05 NOTE — Progress Notes (Signed)
Office Visit Note   [name]           Date of Birth: 03-Nov-1980           MRN: [id number] Visit Date: 05/05/2019              Requested by: Gildardo Pounds, NP [place],  [place] [address] PCP: Gildardo Pounds, NP   Assessment & Plan: Visit Diagnoses:  1. Chronic pain of right knee     Plan: Impression is chronic right knee pain with significant joint effusion on presentation today.  I aspirated approximately 35 cc of joint fluid which was sent to the lab.  Patient now needs an MRI to rule out structural abnormality given chronic history of right knee pain.  We will see the patient back after the MRI.  Today's encounter was performed through an interpreter.  Follow-Up Instructions: Return in about 2 weeks (around 05/19/2019).   Orders:  Orders Placed This Encounter  Procedures  . MR Knee Right w/o contrast   No orders of the defined types were placed in this encounter.     Procedures: Large Joint Inj: R knee on 05/05/2019 8:33 PM Indications: pain Details: 22 G needle  Arthrogram: No  Medications: 40 mg methylPREDNISolone acetate 40 MG/ML; 2 mL lidocaine 1 %; 2 mL bupivacaine 0.5 % Aspirate: 35 mL blood-tinged; sent for lab analysis Outcome: tolerated well, no immediate complications Consent was given by the patient. Patient was prepped and draped in the usual sterile fashion.       Clinical Data: No additional findings.   Subjective: Chief Complaint  Patient presents with  . Right Knee - Pain    Patient is a 37 year old [place] female comes in for worsening right knee pain and swelling.  We have been seeing her since last December for this issue which is now 10 months.  She states that the right knee is very swollen and she has difficulty with using stairs and driving.  She is on her feet 8 to 10 hours a day for work and this is significantly affecting her right knee pain.  She feels like the right knee is very tight.   Review of  Systems  Constitutional: Negative.   HENT: Negative.   Eyes: Negative.   Respiratory: Negative.   Cardiovascular: Negative.   Endocrine: Negative.   Musculoskeletal: Negative.   Neurological: Negative.   Hematological: Negative.   Psychiatric/Behavioral: Negative.   All other systems reviewed and are negative.    Objective: Vital Signs: There were no vitals taken for this visit.  Physical Exam Vitals signs and nursing note reviewed.  Constitutional:      Appearance: She is well-developed.  Pulmonary:     Effort: Pulmonary effort is normal.  Skin:    General: Skin is warm.     Capillary Refill: Capillary refill takes less than 2 seconds.  Neurological:     Mental Status: She is alert and oriented to person, place, and time.  Psychiatric:        Behavior: Behavior normal.        Thought Content: Thought content normal.        Judgment: Judgment normal.     Ortho Exam Right knee exam shows a decent sized joint effusion.  Limitation range of motion secondary to the effusion.  Otherwise exam is unremarkable. Specialty Comments:  No specialty comments available.  Imaging: No results found.   PMFS History: Patient Active Problem List  Diagnosis Date Noted  . Chronic pain of right knee 06/26/2018  . Encounter for initial prescription of Nexplanon 08/21/2016  . S/P repeat low transverse C-section 06/27/2016  . 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 Previous cesarean delivery, antepartum condition or complication 27/02/2375  . Language barrier, cultural differences 04/28/2016  . Advanced maternal age in multigravida    Past Medical History:  Diagnosis Date  . Acid reflux   . Female circumcision   . Hemorrhoid   . 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 Hypercholesteremia     Family History  Problem Relation Age of Onset  . Diabetes Mother   . Diabetes Sister   . Diabetes Brother   . Hypertension  Brother   . Stroke Maternal Aunt   . Other Neg Hx     Past Surgical History:  Procedure Laterality Date  . CESAREAN SECTION    . CESAREAN SECTION N/A 01/02/2013   Procedure: CESAREAN SECTION;  Surgeon: Donnamae Jude, MD;  Location: [facility];  Service: Obstetrics;  Laterality: N/A;  Incidental Cyystotomy  . CESAREAN SECTION N/A 06/26/2016   Procedure: CESAREAN SECTION;  Surgeon: Aletha Halim, MD;  Location: [facility];  Service: Obstetrics;  Laterality: N/A;  . CYSTOTOMY     with repair at 2014 repeat c-section   Social History   Occupational History  . Not on file  Tobacco Use  . Smoking status: Never Smoker  . Smokeless tobacco: Never Used  Substance and Sexual Activity  . Alcohol use: No  . Drug use: No  . Sexual activity: Yes    Birth control/protection: Implant

## 2019-05-06 LAB — SYNOVIAL CELL COUNT + DIFF, W/ CRYSTALS
Basophils, %: 0 %
Eosinophils-Synovial: 0 % (ref 0–2)
Lymphocytes-Synovial Fld: 70 % (ref 0–74)
Monocyte/Macrophage: 23 % (ref 0–69)
Neutrophil, Synovial: 7 % (ref 0–24)
Synoviocytes, %: 0 % (ref 0–15)
WBC, Synovial: 924 cells/uL — ABNORMAL HIGH (ref <150)

## 2019-05-11 ENCOUNTER — Encounter: Payer: Self-pay | Admitting: Obstetrics and Gynecology

## 2019-05-11 ENCOUNTER — Other Ambulatory Visit: Payer: Self-pay

## 2019-05-11 ENCOUNTER — Other Ambulatory Visit (HOSPITAL_COMMUNITY)
Admission: RE | Admit: 2019-05-11 | Discharge: 2019-05-11 | Disposition: A | Discharge status: Home / Self Care | Payer: Medicaid Other | Source: Ambulatory Visit | Attending: Obstetrics and Gynecology | Admitting: Obstetrics and Gynecology

## 2019-05-11 ENCOUNTER — Ambulatory Visit: Payer: Medicaid Other | Admitting: Critical Care Medicine

## 2019-05-11 ENCOUNTER — Ambulatory Visit (INDEPENDENT_AMBULATORY_CARE_PROVIDER_SITE_OTHER): Payer: Medicaid Other | Admitting: Obstetrics and Gynecology

## 2019-05-11 VITALS — BP 114/79 | HR 68 | Temp 97.5°F | Wt 178.0 lb

## 2019-05-11 DIAGNOSIS — Z113 Encounter for screening for infections with a predominantly sexual mode of transmission: Secondary | ICD-10-CM | POA: Diagnosis not present

## 2019-05-11 DIAGNOSIS — Z789 Other specified health status: Secondary | ICD-10-CM

## 2019-05-11 DIAGNOSIS — Z124 Encounter for screening for malignant neoplasm of cervix: Secondary | ICD-10-CM

## 2019-05-11 DIAGNOSIS — Z01419 Encounter for gynecological examination (general) (routine) without abnormal findings: Secondary | ICD-10-CM

## 2019-05-11 DIAGNOSIS — R5383 Other fatigue: Secondary | ICD-10-CM

## 2019-05-11 DIAGNOSIS — Z0000 Encounter for general adult medical examination without abnormal findings: Secondary | ICD-10-CM

## 2019-05-11 DIAGNOSIS — Z3009 Encounter for other general counseling and advice on contraception: Secondary | ICD-10-CM

## 2019-05-11 NOTE — Progress Notes (Signed)
GYNECOLOGY ANNUAL PREVENTATIVE CARE ENCOUNTER NOTE  Subjective:   Michaela Moran is a 38 y.o. G9P3003 female here for a annual gynecologic exam. Current complaints: fatigue, wants to discuss contraception (Nexplanon expires 08/2019).  She is happy with Nexplanon, has some irregular bleeding but it is not bothersome to her. Denies abnormal vaginal bleeding, discharge, pelvic pain, problems with intercourse or other gynecologic concerns.   Reports fatigue in the last few years, states she has not had as much energy as she has had in the past and would like vitamins to give her more energy.    Gynecologic History No LMP recorded. Patient has had an implant. Contraception: Nexplanon Last Pap: n/a Last mammogram: n/a  Obstetric History OB History  Gravida Para Term Preterm AB Living  _0 SAB TAB Ectopic Multiple Live Births        0 3    # Outcome Date GA Lbr Len/2nd Weight Sex Delivery Anes PTL Lv  3 Term 06/26/16 [redacted]w[redacted]d 6 lb 15.8 oz (3.17 kg) M CS-Classical Spinal  LIV  2 Term 01/02/13 443w6d4:31 / 13:07 9 lb 0.5 oz (4.095 kg) M CS-LVertical EPI  LIV  1 Term 06/10/10 4043w0d lb 14 oz (3.118 kg) F CS-Unspec EPI N LIV     Birth Comments: unknown reason for c/section    Past Medical History:  Diagnosis Date  . Acid reflux   . Female circumcision   . Hemorrhoid   . 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 Hypercholesteremia     Past Surgical History:  Procedure Laterality Date  . CESAREAN SECTION    . CESAREAN SECTION N/A 01/02/2013   Procedure: CESAREAN SECTION;  Surgeon: TanDonnamae JudeD;  [facility];  Service: Obstetrics;  Laterality: N/A;  Incidental Cyystotomy  . CESAREAN SECTION N/A 06/26/2016   Procedure: CESAREAN SECTION;  Surgeon: ChaAletha HalimD;  [facility];  Laterality: N/A;  . CYSTOTOMY     with repair at 2014 repeat c-section    Current Outpatient Medications on File Prior to Visit  Medication Sig  Dispense Refill  . cyclobenzaprine (FLEXERIL) 5 MG tablet Take 1 tablet (5 mg total) by mouth at bedtime as needed for muscle spasms. 30 tablet 0  . etonogestrel (NEXPLANON) 68 MG IMPL implant 1 each by Subdermal route once. Implanted October 2017    . omeprazole (PRILOSEC) 20 MG capsule Take 1 capsule (20 mg total) by mouth daily. 90 capsule 1  . Vitamin D, Ergocalciferol, (DRISDOL) 1.25 MG (50000 UT) CAPS capsule Take 1 capsule (50,000 Units total) by mouth every 7 (seven) days. 16 capsule 0  . albuterol (PROAIR HFA) 108 (90 Base) MCG/ACT inhaler Inhale 2 puffs into the lungs every 6 (six) hours as needed for wheezing or shortness of breath. (Patient not taking: Reported on 05/11/2019) 8.5 g 2  . cetirizine (ZYRTEC) 10 MG tablet Take 1 tablet (10 mg total) by mouth at bedtime. Please MAIL (Patient not taking: Reported on 05/11/2019) 90 tablet 2  . diclofenac sodium (VOLTAREN) 1 % GEL Apply 2 g topically 4 (four) times daily. 1 Tube 2  . meloxicam (MOBIC) 15 MG tablet Take 0.5-1 tablets (7.5-15 mg total) by mouth daily. (Patient not taking: Reported on 05/11/2019) 30 tablet 0  . traMADol (ULTRAM) 50 MG tablet Take 1-2 tablets (50-100 mg total) by mouth 3 (three) times daily as needed. (Patient not taking:  Reported on 05/11/2019) 30 tablet 2   No current facility-administered medications on file prior to visit.     Allergies  Allergen Reactions  . Other Swelling    Allergic to peas.    Social History   Socioeconomic History  . Marital status: Married    Spouse name: Not on file  . Number of children: Not on file  . Years of education: Not on file  . Highest education level: Not on file  Occupational History  . Not on file  Social Needs  . Financial resource strain: Not on file  . Food insecurity    Worry: Not on file    Inability: Not on file  . Transportation needs    Medical: Not on file    Non-medical: Not on file  Tobacco Use  . Smoking status: Never Smoker  . Smokeless  tobacco: Never Used  Substance and Sexual Activity  . Alcohol use: No  . Drug use: No  . Sexual activity: Yes    Birth control/protection: Implant  Lifestyle  . Physical activity    Days per week: Not on file    Minutes per session: Not on file  . Stress: Not on file  Relationships  . Social Herbalist on phone: Not on file    Gets together: Not on file    Attends religious service: Not on file    Active member of club or organization: Not on file    Attends meetings of clubs or organizations: Not on file    Relationship status: Not on file  . Intimate partner violence    Fear of current or ex partner: Not on file    Emotionally abused: Not on file    Physically abused: Not on file    Forced sexual activity: Not on file  Other Topics Concern  . Not on file  Social History Narrative  . Not on file    Family History  Problem Relation Age of Onset  . Diabetes Mother   . Diabetes Sister   . Diabetes Brother   . Hypertension Brother   . Stroke Maternal Aunt   . Other Neg Hx     The following portions of the patient's history were reviewed and updated as appropriate: allergies, current medications, past family history, past medical history, past social history, past surgical history and problem list.  Review of Systems Pertinent items are noted in HPI.   Objective:  BP 114/79   Pulse 68   Temp (!) 97.5 F (36.4 C) (Oral)   Wt 178 lb (80.7 kg)   BMI 28.73 kg/m  CONSTITUTIONAL: Well-developed, well-nourished female in no acute distress.  HENT:  Normocephalic, atraumatic, External right and left ear normal. Oropharynx is clear and moist EYES: Conjunctivae and EOM are normal. Pupils are equal, round, and reactive to light. No scleral icterus.  NECK: Normal range of motion, supple, no masses.  Normal thyroid.  SKIN: Skin is warm and dry. No rash noted. Not diaphoretic. No erythema. No pallor. NEUROLOGIC: Alert and oriented to person, place, and time. Normal  reflexes, muscle tone coordination. No cranial nerve deficit noted. PSYCHIATRIC: Normal mood and affect. Normal behavior. Normal judgment and thought content. CARDIOVASCULAR: Normal heart rate noted RESPIRATORY:  Effort normal, no problems with respiration noted. BREASTS: Symmetric in size. No masses, skin changes, nipple drainage, or lymphadenopathy. ABDOMEN: Soft, normal bowel sounds, no distention noted.  No tenderness, rebound or guarding.  PELVIC: external genitalia with prior clitoroidectomy, anterior  labia fused; normal appearing vaginal mucosa and cervix.  No abnormal discharge noted.  Pap smear obtained.  Normal uterine size, no other palpable masses, no uterine or adnexal tenderness. MUSCULOSKELETAL: Normal range of motion. No tenderness.  No cyanosis, clubbing, or edema.  2+ distal pulses.  Exam done with chaperone present.  Assessment and Plan:   1. Screening for cervical cancer - Cytology - PAP( [facility])  2. Fatigue, unspecified type - CBC - TSH - Vitamin D (25 hydroxy) - Comp Met (CMET)  3. Encounter for counseling regarding contraception Patient has Nexplanon in place, due to expire 08/2016, she is concerned about getting it replaced if there is a 2nd wave of COVID-19 and the office closes - reassured her we will get her in for replacement nearer to the time of its expiration, but that we can get her in sooner if she desires - she is fine with replacement around time of expiration, will plan for her to return then - she is happy with Nexplanon and wants to have another placed  4. Language barrier Somali interpretor used  5. Routine screening for STI (sexually transmitted infection) - gonorrhea - chlamydia - trichomonas - HIV antibody (with reflex) - RPR - Hepatitis B surface antigen - Hepatitis C Antibody  6. Well woman exam - mammogram ordered as patient is 1 month from being 30  Will follow up results of pap smear/STI screen and manage accordingly.  Encouraged improvement in diet and exercise.    Routine preventative health maintenance measures emphasized. Please refer to After Visit Summary for other counseling recommendations.   Total face-to-face time with patient: 30 minutes. Over 50% of encounter was spent on counseling and coordination of care.   Feliz Beam, M.D. Attending Center for [employer] [facility])

## 2019-05-11 NOTE — Progress Notes (Signed)
Pt is in the office for annual exam.  *Virtual Dexter used*  Last pap :N/A LMP: None w/ Nexplanon  Contraception: Nexplanon Inserted 08/21/2016 Left Arm  STD Screening:  None   CC: None  pt wants Vitamin C prescribed

## 2019-05-12 LAB — COMPREHENSIVE METABOLIC PANEL
ALT: 9 IU/L (ref 0–32)
AST: 11 IU/L (ref 0–40)
Albumin/Globulin Ratio: 1.6 (ref 1.2–2.2)
Albumin: 4.3 g/dL (ref 3.8–4.8)
Alkaline Phosphatase: 73 IU/L (ref 39–117)
BUN/Creatinine Ratio: 18 (ref 9–23)
BUN: 13 mg/dL (ref 6–20)
Bilirubin Total: 0.2 mg/dL (ref 0.0–1.2)
CO2: 24 mmol/L (ref 20–29)
Calcium: 9 mg/dL (ref 8.7–10.2)
Chloride: 99 mmol/L (ref 96–106)
Creatinine, Ser: 0.74 mg/dL (ref 0.57–1.00)
GFR calc Af Amer: 118 mL/min/{1.73_m2} (ref >59)
GFR calc non Af Amer: 102 mL/min/{1.73_m2} (ref >59)
Globulin, Total: 2.7 g/dL (ref 1.5–4.5)
Glucose: 99 mg/dL (ref 65–99)
Potassium: 3.8 mmol/L (ref 3.5–5.2)
Sodium: 138 mmol/L (ref 134–144)
Total Protein: 7 g/dL (ref 6.0–8.5)

## 2019-05-12 LAB — RPR: RPR Ser Ql: NONREACTIVE

## 2019-05-12 LAB — CBC
Hematocrit: 40.8 % (ref 34.0–46.6)
Hemoglobin: 13.6 g/dL (ref 11.1–15.9)
MCH: 30.4 pg (ref 26.6–33.0)
MCHC: 33.3 g/dL (ref 31.5–35.7)
MCV: 91 fL (ref 79–97)
Platelets: 286 10*3/uL (ref 150–450)
RBC: 4.48 x10E6/uL (ref 3.77–5.28)
RDW: 12.8 % (ref 11.7–15.4)
WBC: 7.4 10*3/uL (ref 3.4–10.8)

## 2019-05-12 LAB — HIV ANTIBODY (ROUTINE TESTING W REFLEX): HIV Screen 4th Generation wRfx: NONREACTIVE

## 2019-05-12 LAB — VITAMIN D 25 HYDROXY (VIT D DEFICIENCY, FRACTURES): Vit D, 25-Hydroxy: 15.9 ng/mL — ABNORMAL LOW (ref 30.0–100.0)

## 2019-05-12 LAB — HEPATITIS C ANTIBODY: Hep C Virus Ab: 0.1 s/co ratio (ref 0.0–0.9)

## 2019-05-12 LAB — TSH: TSH: 2.89 u[IU]/mL (ref 0.450–4.500)

## 2019-05-12 LAB — HEPATITIS B SURFACE ANTIGEN: Hepatitis B Surface Ag: NEGATIVE

## 2019-05-13 LAB — CYTOLOGY - PAP
Adequacy: ABSENT
Diagnosis: NEGATIVE
HPV: NOT DETECTED

## 2019-05-13 LAB — CERVICOVAGINAL ANCILLARY ONLY
Bacterial vaginitis: NEGATIVE
Candida vaginitis: NEGATIVE
Chlamydia: NEGATIVE
Neisseria Gonorrhea: NEGATIVE
Trichomonas: NEGATIVE

## 2019-05-19 ENCOUNTER — Ambulatory Visit (INDEPENDENT_AMBULATORY_CARE_PROVIDER_SITE_OTHER): Payer: Medicaid Other | Admitting: Orthopaedic Surgery

## 2019-05-19 DIAGNOSIS — M25561 Pain in right knee: Secondary | ICD-10-CM

## 2019-05-19 DIAGNOSIS — G8929 Other chronic pain: Secondary | ICD-10-CM | POA: Diagnosis not present

## 2019-05-19 NOTE — Progress Notes (Signed)
   Office Visit Note   [name]           Date of Birth: 1981/07/30           MRN: [id number] Visit Date: 05/19/2019              Requested by: Gildardo Pounds, NP [place],  [place] [address] PCP: Gildardo Pounds, NP   Assessment & Plan: Visit Diagnoses:  1. Chronic pain of right knee     Plan: We will see her back after the MRI.  For now she can return back to work as long she does not have to use stairs.  Follow-Up Instructions: Return if symptoms worsen or fail to improve.   Orders:  No orders of the defined types were placed in this encounter.  No orders of the defined types were placed in this encounter.     Procedures: No procedures performed   Clinical Data: No additional findings.   Subjective: Chief Complaint  Patient presents with  . Right Knee - Follow-up    Patient returns today for follow-up of her right knee.  She states that the knee is doing really well status post the aspiration injection.  Her MRI is scheduled for September 28.  She is requesting to go back to work tomorrow.   Review of Systems   Objective: Vital Signs: There were no vitals taken for this visit.  Physical Exam  Ortho Exam Right knee exam shows no joint effusion.  Good range of motion without pain. Specialty Comments:  No specialty comments available.  Imaging: No results found.   PMFS History: Patient Active Problem List   Diagnosis Date Noted  . Chronic pain of right knee 06/26/2018  . Encounter for initial prescription of Nexplanon 08/21/2016  . S/P repeat low transverse C-section 06/27/2016  . 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 Previous cesarean delivery, antepartum condition or complication [id number]  . Language barrier, cultural differences 04/28/2016  . Advanced maternal age in multigravida    Past Medical History:  Diagnosis Date  . Acid reflux   . Female  circumcision   . Hemorrhoid   . 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 Hypercholesteremia     Family History  Problem Relation Age of Onset  . Diabetes Mother   . Diabetes Sister   . Diabetes Brother   . Hypertension Brother   . Stroke Maternal Aunt   . Other Neg Hx     Past Surgical History:  Procedure Laterality Date  . CESAREAN SECTION    . CESAREAN SECTION N/A 01/02/2013   Procedure: CESAREAN SECTION;  Surgeon: Donnamae Jude, MD;  Location: [facility];  Service: Obstetrics;  Laterality: N/A;  Incidental Cyystotomy  . CESAREAN SECTION N/A 06/26/2016   Procedure: CESAREAN SECTION;  Surgeon: Aletha Halim, MD;  Location: [facility];  Service: Obstetrics;  Laterality: N/A;  . CYSTOTOMY     with repair at 2014 repeat c-section   Social History   Occupational History  . Not on file  Tobacco Use  . Smoking status: Never Smoker  . Smokeless tobacco: Never Used  Substance and Sexual Activity  . Alcohol use: No  . Drug use: No  . Sexual activity: Yes    Birth control/protection: Implant

## 2019-05-20 NOTE — Progress Notes (Signed)
Spoke with patient she is currently on vitamin d 50,000 units once per week. She has been on this since MAY 2019. Please what she can do.

## 2019-05-21 ENCOUNTER — Telehealth: Payer: Self-pay | Admitting: Orthopaedic Surgery

## 2019-05-21 NOTE — Telephone Encounter (Signed)
Pt called in requesting a note for her employer stating that she needs a chair due to sitting for a long amount of time is irritating both of her knee's.    [phone]

## 2019-05-21 NOTE — Telephone Encounter (Signed)
ok 

## 2019-05-21 NOTE — Telephone Encounter (Signed)
Is this ok?

## 2019-05-21 NOTE — Progress Notes (Signed)
Called patient to inform her of Dr.Davis recommendation no answer or voice mail to leave a message.Marland Kitchen

## 2019-05-22 NOTE — Telephone Encounter (Signed)
LMOM for patient asking her to call back and let us know exactly what the note says and we will get that written for her

## 2019-06-11 ENCOUNTER — Telehealth: Payer: Self-pay | Admitting: Orthopaedic Surgery

## 2019-06-11 NOTE — Telephone Encounter (Signed)
Patient called. Says it is hard for her to work. Can not walk, drive or stand. Would like a work note to be out. She is in pain. Her call back number is [phone]

## 2019-06-12 NOTE — Telephone Encounter (Signed)
Would like to be OOW until November.

## 2019-06-12 NOTE — Telephone Encounter (Signed)
No I can't justify taking her out of work until November.  I can take her out for 1 week.  If she continues to have problems then we will need to get an MRI.

## 2019-06-12 NOTE — Telephone Encounter (Signed)
Out for how long?

## 2019-06-12 NOTE — Telephone Encounter (Signed)
See message below. If yes,  What note would you like for me to write her?

## 2019-06-15 ENCOUNTER — Ambulatory Visit
Admission: RE | Admit: 2019-06-15 | Discharge: 2019-06-15 | Disposition: A | Discharge status: Home / Self Care | Payer: Medicaid Other | Source: Ambulatory Visit | Attending: Orthopaedic Surgery | Admitting: Orthopaedic Surgery

## 2019-06-15 ENCOUNTER — Other Ambulatory Visit: Payer: Self-pay

## 2019-06-15 ENCOUNTER — Telehealth: Payer: Self-pay | Admitting: Orthopaedic Surgery

## 2019-06-15 DIAGNOSIS — M5416 Radiculopathy, lumbar region: Secondary | ICD-10-CM

## 2019-06-15 DIAGNOSIS — M48061 Spinal stenosis, lumbar region without neurogenic claudication: Secondary | ICD-10-CM | POA: Diagnosis not present

## 2019-06-15 DIAGNOSIS — M25561 Pain in right knee: Secondary | ICD-10-CM | POA: Diagnosis not present

## 2019-06-15 DIAGNOSIS — G8929 Other chronic pain: Secondary | ICD-10-CM

## 2019-06-15 NOTE — Telephone Encounter (Signed)
yes

## 2019-06-15 NOTE — Telephone Encounter (Signed)
Ready for pick up, patient aware.

## 2019-06-15 NOTE — Telephone Encounter (Signed)
See message. Okay for note? OOW 1 week.

## 2019-06-15 NOTE — Telephone Encounter (Signed)
Patient called. Says she would like a note for work. I explained that Dr. Erlinda Hong said that he can take her out for 1 week. She says that is fine. Her call back number is [phone]

## 2019-06-15 NOTE — Telephone Encounter (Signed)
See other msg

## 2019-06-18 ENCOUNTER — Encounter: Payer: Self-pay | Admitting: Orthopaedic Surgery

## 2019-06-18 ENCOUNTER — Other Ambulatory Visit: Payer: Self-pay

## 2019-06-18 ENCOUNTER — Ambulatory Visit (INDEPENDENT_AMBULATORY_CARE_PROVIDER_SITE_OTHER): Payer: Medicaid Other | Admitting: Orthopaedic Surgery

## 2019-06-18 DIAGNOSIS — G8929 Other chronic pain: Secondary | ICD-10-CM

## 2019-06-18 DIAGNOSIS — M25561 Pain in right knee: Secondary | ICD-10-CM | POA: Diagnosis not present

## 2019-06-18 DIAGNOSIS — M5416 Radiculopathy, lumbar region: Secondary | ICD-10-CM | POA: Diagnosis not present

## 2019-06-18 NOTE — Progress Notes (Signed)
[name]           Date of Birth: June 28, 1981           MRN: [id number] Visit Date: 06/18/2019 PCP: Gildardo Pounds, NP   Assessment & Plan:  Chief Complaint:  Chief Complaint  Patient presents with  . Lower Back - Pain  . Right Knee - Pain   Visit Diagnoses:  1. Lumbar radiculopathy   2. Chronic pain of right knee     Plan: Patient is a pleasant 38 year old female who presents our clinic today to discuss MRI results of the right knee and lumbar spine.  History of right knee and entire right leg pain for quite some time.  She has undergone knee aspiration and injection with great relief of symptoms, although her effusions continue to recur.  MRI of the right knee shows degeneration to the medial and lateral meniscus, ACL and PCL.  MRI of the lumbar spine shows disc protrusion at L4-5 resulting in moderate right subarticular recess stenosis and mild right foraminal stenosis.  In regards to the right knee, structurally, do not feel as though she would benefit from surgical intervention at this time.  We will proceed with drawing labs for arthritis panel to assess for autoimmune disease given her recurrent effusions.  We will call her with the results.  In regards to her lumbar spine MRI, we will refer her to Dr. Ernestina Patches for epidural steroid injection.  She will otherwise follow-up with [place] as needed.  Follow-Up Instructions: Return if symptoms worsen or fail to improve.   Orders:  Orders Placed This Encounter  Procedures  . Antinuclear Antib (ANA)  . Rheumatoid Factor  . Sed Rate (ESR)  . Uric acid  . Ambulatory referral to Physical Medicine Rehab   No orders of the defined types were placed in this encounter.   Imaging:   PMFS History: Patient Active Problem List   Diagnosis Date Noted  . Chronic pain of right knee 06/26/2018  . Encounter for initial prescription of Nexplanon 08/21/2016  . S/P repeat low transverse C-section 06/27/2016  . Pelvic adhesive  disease 06/09/2016  . 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 Previous cesarean delivery, antepartum condition or complication 36/64/4034  . Language barrier, cultural differences 04/28/2016  . Advanced maternal age in multigravida    Past Medical History:  Diagnosis Date  . Acid reflux   . Female circumcision   . Hemorrhoid   . 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 Hypercholesteremia     Family History  Problem Relation Age of Onset  . Diabetes Mother   . Diabetes Sister   . Diabetes Brother   . Hypertension Brother   . Stroke Maternal Aunt   . Other Neg Hx     Past Surgical History:  Procedure Laterality Date  . CESAREAN SECTION    . CESAREAN SECTION N/A 01/02/2013   Procedure: CESAREAN SECTION;  Surgeon: Donnamae Jude, MD;  Location: [facility];  Service: Obstetrics;  Laterality: N/A;  Incidental Cyystotomy  . CESAREAN SECTION N/A 06/26/2016   Procedure: CESAREAN SECTION;  Surgeon: Aletha Halim, MD;  Location: [facility];  Service: Obstetrics;  Laterality: N/A;  . CYSTOTOMY     with repair at 2014 repeat c-section   Social History   Occupational History  . Not on file  Tobacco Use  . Smoking status: Never Smoker  . Smokeless tobacco: Never Used  Substance and Sexual  Activity  . Alcohol use: No  . Drug use: No  . Sexual activity: Yes    Birth control/protection: Implant

## 2019-06-19 ENCOUNTER — Telehealth: Payer: Self-pay | Admitting: *Deleted

## 2019-06-19 NOTE — Telephone Encounter (Signed)
Okay for this note?

## 2019-06-19 NOTE — Telephone Encounter (Signed)
Note ready for pick up at the front desk. Patient aware. ? ?

## 2019-06-19 NOTE — Telephone Encounter (Signed)
ok 

## 2019-06-22 ENCOUNTER — Ambulatory Visit: Payer: Medicaid Other

## 2019-06-23 ENCOUNTER — Telehealth: Payer: Self-pay | Admitting: Orthopaedic Surgery

## 2019-06-23 LAB — SEDIMENTATION RATE: Sed Rate: 14 mm/h (ref 0–20)

## 2019-06-23 LAB — ANA: Anti Nuclear Antibody (ANA): POSITIVE — AB

## 2019-06-23 LAB — ANTI-NUCLEAR AB-TITER (ANA TITER): ANA Titer 1: 1:40 {titer} — ABNORMAL HIGH

## 2019-06-23 LAB — URIC ACID: Uric Acid, Serum: 4.4 mg/dL (ref 2.5–7.0)

## 2019-06-23 LAB — RHEUMATOID FACTOR: Rheumatoid fact SerPl-aCnc: <14 IU/mL (ref <14)

## 2019-06-23 NOTE — Progress Notes (Signed)
Please let her know that lab work is normal

## 2019-06-23 NOTE — Telephone Encounter (Signed)
Patient called stating that she received a voicemail in regards to her lab results and was told to give our office a call back.  CB#[phone].  Thank you.

## 2019-06-23 NOTE — Telephone Encounter (Signed)
Called to advise on lab results.

## 2019-06-24 ENCOUNTER — Ambulatory Visit: Payer: Medicaid Other

## 2019-06-24 NOTE — Progress Notes (Signed)
Please let her know that one of the lab values just came back and is elevated.  Could be autoimmune disease, but we need to refer to rheumatologist.  Please also refer to rheumatologist

## 2019-06-25 ENCOUNTER — Other Ambulatory Visit: Payer: Self-pay

## 2019-06-25 DIAGNOSIS — R768 Other specified abnormal immunological findings in serum: Secondary | ICD-10-CM

## 2019-07-20 ENCOUNTER — Ambulatory Visit: Payer: Self-pay

## 2019-07-20 ENCOUNTER — Encounter: Payer: Self-pay | Admitting: Physical Medicine and Rehabilitation

## 2019-07-20 ENCOUNTER — Ambulatory Visit (INDEPENDENT_AMBULATORY_CARE_PROVIDER_SITE_OTHER): Payer: Medicaid Other | Admitting: Physical Medicine and Rehabilitation

## 2019-07-20 ENCOUNTER — Telehealth: Payer: Self-pay | Admitting: Orthopaedic Surgery

## 2019-07-20 VITALS — BP 143/67 | HR 60

## 2019-07-20 DIAGNOSIS — M5416 Radiculopathy, lumbar region: Secondary | ICD-10-CM | POA: Diagnosis not present

## 2019-07-20 MED ORDER — BETAMETHASONE SOD PHOS & ACET 6 (3-3) MG/ML IJ SUSP
12.0000 mg | Freq: Once | INTRAMUSCULAR | Status: AC
  Administered 2019-07-20: 11:00:00 12 mg

## 2019-07-20 NOTE — Procedures (Signed)
Lumbosacral Transforaminal Epidural Steroid Injection - Sub-Pedicular Approach with Fluoroscopic Guidance  [name]      Date of Birth: Dec 06, 1980 MRN: [id number] PCP: Gildardo Pounds, NP      Visit Date: 07/20/2019   Universal Protocol:    Date/Time: 07/20/2019  Consent Given By: the patient  Position: PRONE  Additional Comments: Vital signs were monitored before and after the procedure. Patient was prepped and draped in the usual sterile fashion. The correct patient, procedure, and site was verified.   Injection Procedure Details:  Procedure Site One Meds Administered:  Meds ordered this encounter  Medications  . betamethasone acetate-betamethasone sodium phosphate (CELESTONE) injection 12 mg    Laterality: Right  Location/Site:  L5-S1  Needle size: 22 G  Needle type: Spinal  Needle Placement: Transforaminal  Findings:    -Comments: Excellent flow of contrast along the nerve and into the epidural space.  Procedure Details: After squaring off the end-plates to get a true AP view, the C-arm was positioned so that an oblique view of the foramen as noted above was visualized. The target area is just inferior to the "nose of the scotty dog" or sub pedicular. The soft tissues overlying this structure were infiltrated with 2-3 ml. of 1% Lidocaine without Epinephrine.  The spinal needle was inserted toward the target using a "trajectory" view along the fluoroscope beam.  Under AP and lateral visualization, the needle was advanced so it did not puncture dura and was located close the 6 O'Clock position of the pedical in AP tracterory. Biplanar projections were used to confirm position. Aspiration was confirmed to be negative for CSF and/or blood. A 1-2 ml. volume of Isovue-250 was injected and flow of contrast was noted at each level. Radiographs were obtained for documentation purposes.   After attaining the desired flow of contrast documented above, a 0.5 to 1.0 ml  test dose of 0.25% Marcaine was injected into each respective transforaminal space.  The patient was observed for 90 seconds post injection.  After no sensory deficits were reported, and normal lower extremity motor function was noted,   the above injectate was administered so that equal amounts of the injectate were placed at each foramen (level) into the transforaminal epidural space.   Additional Comments:  The patient tolerated the procedure well Dressing: 2 x 2 sterile gauze and Band-Aid    Post-procedure details: Patient was observed during the procedure. Post-procedure instructions were reviewed.  Patient left the clinic in stable condition.

## 2019-07-20 NOTE — Telephone Encounter (Signed)
Is this okay?

## 2019-07-20 NOTE — Telephone Encounter (Signed)
Patient was here. Would like a work note extended until 11/17. Her call back number is [phone]

## 2019-07-20 NOTE — Progress Notes (Signed)
Michaela Moran - 38 y.o. female MRN [id number]  Date of birth: July 19, 1981  Office Visit Note: Visit Date: 07/20/2019 PCP: Gildardo Pounds, NP Referred by: Gildardo Pounds, NP  Subjective: Chief Complaint  Patient presents with  . Lower Back - Pain  . Right Leg - Pain   HPI: Michaela Moran is a 38 y.o. female who comes in today At the request of Dr. Eduard Roux and Tawanna Cooler, PA-C for diagnostic and therapeutic right L5 transforaminal epidural steroid injection.  We did have a translator on the [employer] system today.  The patient has some mildly and does not have a translator present in person.  She has been followed by Dr. Erlinda Hong and his assistant for what was right knee pain and right low back and leg pain.  She reports no relief with prior knee injection although the notes say that this was beneficial short-term.  She still had a fusion.  MRI of the lumbar spine did show disc herniation at L4-5 with some effect of the lateral recess likely impacting the L5 nerve root without frank compression.  This would fit with her symptoms.  Her symptoms are somewhat nondermatomal in the whole leg however.  We went over the risk-benefit and alternatives of the injection.  We gave her ample opportunity to not go through the injection and she did not want to.  She was somewhat anxious.  We did write a note to be out of work for the next few days and there is a follow-up appointment with Dr. Erlinda Hong to address work concerns and further treatment.  If injection is beneficial we did tell her that the disc herniation will resolve in most patients.  If she is not getting any better microdiscectomy could be considered.  She would also do well with a course of physical therapy and with Medicaid some of this may be able to be done at [facility].  ROS Otherwise per HPI.  Assessment & Plan: Visit Diagnoses:  1. Lumbar radiculopathy     Plan: No additional findings.   Meds & Orders:  Meds ordered this encounter  Medications   . betamethasone acetate-betamethasone sodium phosphate (CELESTONE) injection 12 mg    Orders Placed This Encounter  Procedures  . XR C-ARM NO REPORT  . Epidural Steroid injection    Follow-up: Return in 2 weeks (on 08/03/2019) for  Eduard Roux, M.D..   Procedures: No procedures performed  Lumbosacral Transforaminal Epidural Steroid Injection - Sub-Pedicular Approach with Fluoroscopic Guidance  [name]      Date of Birth: [birth year] MRN: [id number] PCP: Gildardo Pounds, NP      Visit Date: 07/20/2019   Universal Protocol:    Date/Time: 07/20/2019  Consent Given By: the patient  Position: PRONE  Additional Comments: Vital signs were monitored before and after the procedure. Patient was prepped and draped in the usual sterile fashion. The correct patient, procedure, and site was verified.   Injection Procedure Details:  Procedure Site One Meds Administered:  Meds ordered this encounter  Medications  . betamethasone acetate-betamethasone sodium phosphate (CELESTONE) injection 12 mg    Laterality: Right  Location/Site:  L5-S1  Needle size: 22 G  Needle type: Spinal  Needle Placement: Transforaminal  Findings:    -Comments: Excellent flow of contrast along the nerve and into the epidural space.  Procedure Details: After squaring off the end-plates to get a true AP view, the C-arm was positioned so that an oblique view of the foramen as  noted above was visualized. The target area is just inferior to the "nose of the scotty dog" or sub pedicular. The soft tissues overlying this structure were infiltrated with 2-3 ml. of 1% Lidocaine without Epinephrine.  The spinal needle was inserted toward the target using a "trajectory" view along the fluoroscope beam.  Under AP and lateral visualization, the needle was advanced so it did not puncture dura and was located close the 6 O'Clock position of the pedical in AP tracterory. Biplanar projections were used to  confirm position. Aspiration was confirmed to be negative for CSF and/or blood. A 1-2 ml. volume of Isovue-250 was injected and flow of contrast was noted at each level. Radiographs were obtained for documentation purposes.   After attaining the desired flow of contrast documented above, a 0.5 to 1.0 ml test dose of 0.25% Marcaine was injected into each respective transforaminal space.  The patient was observed for 90 seconds post injection.  After no sensory deficits were reported, and normal lower extremity motor function was noted,   the above injectate was administered so that equal amounts of the injectate were placed at each foramen (level) into the transforaminal epidural space.   Additional Comments:  The patient tolerated the procedure well Dressing: 2 x 2 sterile gauze and Band-Aid    Post-procedure details: Patient was observed during the procedure. Post-procedure instructions were reviewed.  Patient left the clinic in stable condition.    Clinical History: MRI LUMBAR SPINE WITHOUT CONTRAST  TECHNIQUE: Multiplanar, multisequence MR imaging of the lumbar spine was performed. No intravenous contrast was administered.  COMPARISON:  06/19/2009  FINDINGS: Segmentation:  Standard.  Alignment:  Physiologic.  Vertebrae:  No fracture, evidence of discitis, or bone lesion.  Conus medullaris and cauda equina: Conus extends to the L1 level. Conus and cauda equina appear normal.  Paraspinal and other soft tissues: Negative.  Disc levels:  T12-L1: Unremarkable.  L1-L2: Unremarkable.  L2-L3: Unremarkable.  L3-L4: Unremarkable.  L4-L5: Right paracentral disc protrusion results in moderate right subarticular recess stenosis and mild right foraminal stenosis. Minimal bilateral facet arthrosis. No significant canal stenosis. Findings are new/progressed from prior.  L5-S1: No significant disc protrusion. Mild left greater than right facet arthrosis.  Incidental note of conjoined left L5 and S1 nerve roots. No foraminal or canal stenosis.  IMPRESSION: New right paracentral disc protrusion at L4-5 resulting in moderate right subarticular recess stenosis and mild right foraminal stenosis. Correlate for radicular symptoms in the right L5 and right L4 nerve root distributions. There is no canal stenosis of the lumbar spine.   Electronically Signed   By: Davina Poke M.D.   On: 06/15/2019 13:59   She reports that she has never smoked. She has never used smokeless tobacco.  Recent Labs    06/18/19 1202  LABURIC 4.4    Objective:  VS:  HT:    WT:   BMI:     BP:(!) 143/67  HR:60bpm  TEMP: ( )  RESP:  Physical Exam  Ortho Exam Imaging: Xr C-arm No Report  Result Date: 07/20/2019 Please see Notes tab for imaging impression.   Past Medical/Family/Surgical/Social History: Medications & Allergies reviewed per EMR, new medications updated. Patient Active Problem List   Diagnosis Date Noted  . Chronic pain of right knee 06/26/2018  . Encounter for initial prescription of Nexplanon 08/21/2016  . S/P repeat low transverse C-section 06/27/2016  . 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 Previous cesarean delivery, antepartum  condition or complication 123XX123  . Language barrier, cultural differences 04/28/2016  . Advanced maternal age in multigravida    Past Medical History:  Diagnosis Date  . Acid reflux   . Female circumcision   . Hemorrhoid   . 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 Hypercholesteremia    Family History  Problem Relation Age of Onset  . Diabetes Mother   . Diabetes Sister   . Diabetes Brother   . Hypertension Brother   . Stroke Maternal Aunt   . Other Neg Hx    Past Surgical History:  Procedure Laterality Date  . CESAREAN SECTION    . CESAREAN SECTION N/A 01/02/2013   Procedure: CESAREAN SECTION;  Surgeon: Donnamae Jude, MD;   Location: [facility];  Service: Obstetrics;  Laterality: N/A;  Incidental Cyystotomy  . CESAREAN SECTION N/A 06/26/2016   Procedure: CESAREAN SECTION;  Surgeon: Aletha Halim, MD;  Location: [facility];  Service: Obstetrics;  Laterality: N/A;  . CYSTOTOMY     with repair at 2014 repeat c-section   Social History   Occupational History  . Not on file  Tobacco Use  . Smoking status: Never Smoker  . Smokeless tobacco: Never Used  Substance and Sexual Activity  . Alcohol use: No  . Drug use: No  . Sexual activity: Yes    Birth control/protection: Implant

## 2019-07-20 NOTE — Progress Notes (Signed)
  Numeric Pain Rating Scale and Functional Assessment Average Pain 5   In the last MONTH (on 0-10 scale) has pain interfered with the following?  1. General activity like being  able to carry out your everyday physical activities such as walking, climbing stairs, carrying groceries, or moving a chair?  Rating(7)   +Driver, -BT, -Dye Allergies. 

## 2019-07-20 NOTE — Telephone Encounter (Signed)
dyes

## 2019-07-21 ENCOUNTER — Telehealth: Payer: Self-pay | Admitting: Orthopaedic Surgery

## 2019-07-21 NOTE — Telephone Encounter (Signed)
Ready for pick up at the front desk. She is aware. They will then give her an updated work note at her next visit.

## 2019-07-21 NOTE — Telephone Encounter (Signed)
Patient called advised she will need a note to return to work after she see's Dr Erlinda Hong 08/04/2019. Patient also said she only got a note for a week after her appointment 07/20/2019 from Dr Ernestina Patches.   The number to contact patient is [phone]

## 2019-08-04 ENCOUNTER — Telehealth: Payer: Self-pay

## 2019-08-04 ENCOUNTER — Encounter: Payer: Self-pay | Admitting: Orthopaedic Surgery

## 2019-08-04 ENCOUNTER — Ambulatory Visit (INDEPENDENT_AMBULATORY_CARE_PROVIDER_SITE_OTHER): Payer: Medicaid Other | Admitting: Orthopaedic Surgery

## 2019-08-04 ENCOUNTER — Other Ambulatory Visit: Payer: Self-pay

## 2019-08-04 DIAGNOSIS — M79604 Pain in right leg: Secondary | ICD-10-CM | POA: Diagnosis not present

## 2019-08-04 NOTE — Telephone Encounter (Signed)
Can we re-call her again for a rheumatology appointment please?

## 2019-08-04 NOTE — Progress Notes (Signed)
[name]           Date of Birth: 1981/04/10           MRN: [id number] Visit Date: 08/04/2019 PCP: Gildardo Pounds, NP   Assessment & Plan:  Chief Complaint:  Chief Complaint  Patient presents with  . Lower Back - Follow-up   Visit Diagnoses:  1. Pain in right leg     Plan: Patient is a 38 year old female who presents our clinic today with continued right lower extremity.  We have been seeing her for right-sided lumbar radiculopathy and right knee pain for the past several months.  MRIs of her knee and back were both performed.  MRI of the knee showed degeneration of the medial lateral meniscus, ACL and PCL.  Right knee injection for only temporarily helpful.  MRI of the lumbar spine showed a disc protrusion at L4-5 resulting in L5 nerve root irritation.  Dr. Ernestina Patches proceed with a transforaminal ESI at L5-S1 which seem to have moderately helped her right sided pain.  We have also previously drawn an arthritis panel which showed an elevated ANA.  She was referred to rheumatology where she was called 3 times and never answered.  The referral was then closed.  She has continued to want to stay out of work during all of this time.  At this point, we will start her in physical therapy.  We will also rerefer her to rheumatology.  If she does not get significant relief following physical therapy and rheumatology work-up we may entertain referring her to neurosurgery for possible microdiscectomy.  She will follow-up with [place] as needed.  Follow-Up Instructions: Return if symptoms worsen or fail to improve.   Orders:  No orders of the defined types were placed in this encounter.  No orders of the defined types were placed in this encounter.   Imaging: No new imaging  PMFS History: Patient Active Problem List   Diagnosis Date Noted  . Chronic pain of right knee 06/26/2018  . Encounter for initial prescription of Nexplanon 08/21/2016  . S/P repeat low transverse C-section  06/27/2016  . 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 Previous cesarean delivery, antepartum condition or complication 123XX123  . Language barrier, cultural differences 04/28/2016  . Advanced maternal age in multigravida    Past Medical History:  Diagnosis Date  . Acid reflux   . Female circumcision   . Hemorrhoid   . 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 Hypercholesteremia     Family History  Problem Relation Age of Onset  . Diabetes Mother   . Diabetes Sister   . Diabetes Brother   . Hypertension Brother   . Stroke Maternal Aunt   . Other Neg Hx     Past Surgical History:  Procedure Laterality Date  . CESAREAN SECTION    . CESAREAN SECTION N/A 01/02/2013   Procedure: CESAREAN SECTION;  Surgeon: Donnamae Jude, MD;  Location: [facility];  Service: Obstetrics;  Laterality: N/A;  Incidental Cyystotomy  . CESAREAN SECTION N/A 06/26/2016   Procedure: CESAREAN SECTION;  Surgeon: Aletha Halim, MD;  Location: [facility];  Service: Obstetrics;  Laterality: N/A;  . CYSTOTOMY     with repair at 2014 repeat c-section   Social History   Occupational History  . Not on file  Tobacco Use  . Smoking status: Never Smoker  . Smokeless tobacco: Never Used  Substance and Sexual Activity  . Alcohol use: No  . Drug use: No  . Sexual activity: Yes    Birth control/protection: Implant

## 2019-08-05 NOTE — Telephone Encounter (Signed)
Can patient be scheduled elsewhere in the next 2 weeks? Due to medicaid expiring.

## 2019-08-05 NOTE — Telephone Encounter (Signed)
I called patient to schedule npt appointment with Dr. Estanislado Pandy.  Patient was offered first available appointment on 09/24/19 which patient declined.  Patient stated "she needed an appointment in the next 2 weeks due to her Medicaid expiring."  I offered to put her on our waitlist which patient agreed.  Patient was again offered an appointment in January and patient stated "she can only schedule an appointment in the next 2 weeks and only in the afternoon."

## 2019-08-05 NOTE — Telephone Encounter (Signed)
Ok to refer her to somewhere else that will take her insurance

## 2019-08-06 NOTE — Telephone Encounter (Signed)
Do you know of somewhere that could get her in ASAP?

## 2019-08-10 ENCOUNTER — Ambulatory Visit: Payer: Medicaid Other | Admitting: Obstetrics and Gynecology

## 2019-08-10 ENCOUNTER — Other Ambulatory Visit: Payer: Self-pay

## 2019-08-10 ENCOUNTER — Encounter: Payer: Self-pay | Admitting: Obstetrics and Gynecology

## 2019-08-10 VITALS — BP 105/88 | HR 71 | Ht 66.0 in | Wt 182.7 lb

## 2019-08-10 DIAGNOSIS — Z3046 Encounter for surveillance of implantable subdermal contraceptive: Secondary | ICD-10-CM | POA: Diagnosis not present

## 2019-08-10 MED ORDER — VITAFOL ULTRA 29-0.6-0.4-200 MG PO CAPS: 1.0000 | ORAL_CAPSULE | Freq: Every day | ORAL | 12 refills | Status: DC

## 2019-08-10 NOTE — Progress Notes (Signed)
GYN presents for Nexplanon removal, she does not wants it replaced as she wants another baby.  C/o fatigue

## 2019-08-10 NOTE — Telephone Encounter (Signed)
Most rheumatology are out until after holidays. I can try to get her in with [facility] rheumatology.

## 2019-08-10 NOTE — Progress Notes (Signed)
38 yo here for Nexplanon removal. Nexplanon inserted 08/21/2016. Patient desire to conceive  Removal Patient given informed consent for removal of her Implanon, time out was performed.  Signed copy in the chart.  Appropriate time out taken. Nexplanon site identified.  Area prepped in usual sterile fashon. One cc of 1% lidocaine was used to anesthetize the area at the distal end of the implant. A small stab incision was made right beside the implant on the distal portion.  The iNexplanon rod was grasped using hemostats and removed without difficulty.  There was less than 3 cc blood loss. There were no complications.  A small amount of antibiotic ointment and steri-strips were applied over the small incision.  A pressure bandage was applied to reduce any bruising.  The patient tolerated the procedure well and was given post procedure instructions. Patient advised to start taking prenatal vitamins

## 2019-08-20 ENCOUNTER — Ambulatory Visit: Payer: Medicaid Other | Attending: Physician Assistant | Admitting: Physical Therapy

## 2019-08-20 ENCOUNTER — Encounter: Payer: Self-pay | Admitting: Physical Therapy

## 2019-08-20 ENCOUNTER — Other Ambulatory Visit: Payer: Self-pay

## 2019-08-20 DIAGNOSIS — M5416 Radiculopathy, lumbar region: Secondary | ICD-10-CM | POA: Insufficient documentation

## 2019-08-20 DIAGNOSIS — M545 Low back pain, unspecified: Secondary | ICD-10-CM

## 2019-08-20 DIAGNOSIS — G8929 Other chronic pain: Secondary | ICD-10-CM | POA: Diagnosis not present

## 2019-08-20 NOTE — Therapy (Signed)
[place], [place], [address] Phone: [phone]   Fax:  [phone]  Physical Therapy Evaluation  Patient Details  Name: Michaela Moran MRN: [id number] Date of Birth: Nov 20, 1980 Referring Provider (PT): Aundra Dubin, [place]   Encounter Date: 08/20/2019  PT End of Session - 08/20/19 0911    Visit Number  1    Date for PT Re-Evaluation  10/16/19    Authorization Type  MCD   auth submitted 12/3    PT Start Time  0832    PT Stop Time  0915    PT Time Calculation (min)  43 min    Activity Tolerance  Patient tolerated treatment well    Behavior During Therapy  [facility] for tasks assessed/performed       Past Medical History:  Diagnosis Date  . Acid reflux   . Female circumcision   . Hemorrhoid   . 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 Hypercholesteremia     Past Surgical History:  Procedure Laterality Date  . CESAREAN SECTION    . CESAREAN SECTION N/A 01/02/2013   Procedure: CESAREAN SECTION;  Surgeon: Donnamae Jude, MD;  Location: [facility];  Service: Obstetrics;  Laterality: N/A;  Incidental Cyystotomy  . CESAREAN SECTION N/A 06/26/2016   Procedure: CESAREAN SECTION;  Surgeon: Aletha Halim, MD;  Location: [place];  Service: Obstetrics;  Laterality: N/A;  . CYSTOTOMY     with repair at 2014 repeat c-section    There were no vitals filed for this visit.   Subjective Assessment - 08/20/19 0839    Subjective  Started 2019 when I slipped. Have autistic child. Went to ER & referred to Ortho for injection. Pain returned June 2020 and given another injection. after that, was unable to bedn knee/leg. I have pain in the back of my neck, I was unable to work. It is a nerve problem. The MD told me there is fluid in my knee and the ligament in my back is pressing on a nerve.    How long can you stand comfortably?  <10 min    Patient Stated Goals  bending, caring for children, household chores, sleep    Currently in Pain?  Yes    Pain Score  5     Pain Location  Back    Pain Orientation  Right;Lower    Pain Descriptors / Indicators  --   excruciating   Aggravating Factors   bending, standing, laying on rt side    Pain Relieving Factors  rest, lay on Lt side         [facility] PT Assessment - 08/20/19 0001      Assessment   Medical Diagnosis  Rt lumbar radiculopathy    Referring Provider (PT)  Aundra Dubin, PA-C    Onset Date/Surgical Date  --   2019, flare in June 2020   Hand Dominance  Right    Prior Therapy  no      Precautions   Precautions  None      Restrictions   Weight Bearing Restrictions  No      Balance Screen   Has the patient fallen in the past 6 months  No      [place] residence    Living Arrangements  Spouse/significant other;Children    Additional Comments  stairs at home      Prior Function   Level of Independence  Independent    Vocation Requirements  work at [employer], Therapist, nutritional Status  Within [employer] for tasks assessed      Sensation   Additional Comments  WFL      ROM / Strength   AROM / PROM / Strength  Strength;AROM      AROM   Overall AROM Comments  WFL- pain noted in flexion      Strength   Overall Strength Comments  not appropriate to test today due to pelvic rotation      Palpation   Palpation comment  Lt post innom rotation resulting in functional LLD (Lt shorter)                Objective measurements completed on examination: See above findings.      [facility] Adult PT Treatment/Exercise - 08/20/19 0001      Exercises   Exercises  Lumbar      Lumbar Exercises: Supine   Pelvic Tilt Limitations  hooklying post pelvic tilt    Other Supine Lumbar Exercises  hooklying isometric adduction      Manual Therapy   Manual Therapy  Muscle Energy Technique    Muscle Energy Technique  Lt flexors/Rt extensors             PT  Education - 08/20/19 0928    Education Details  anatomy of condition, POC, HEP, exercise form/rationale    Person(s) Educated  Patient    Methods  Explanation;Demonstration;Tactile cues;Verbal cues;Handout    Comprehension  Verbalized understanding;Returned demonstration;Verbal cues required;Tactile cues required;Need further instruction       PT Short Term Goals - 08/20/19 0923      PT SHORT TERM GOAL #1   Title  pt will present pelvis in neutral position    Baseline  rotated at eval    Time  3    Period  Weeks    Status  New    Target Date  09/10/19      PT SHORT TERM GOAL #2   Title  pt will be independent with HEP as it has been established in the short term    Baseline  will progress as appropriate    Time  3    Period  Weeks    Status  New    Target Date  09/10/19      PT SHORT TERM GOAL #3   Title  pt will be able to bend with only mild discomfort    Baseline  "excruciating" at eval    Time  3    Period  Weeks    Status  New    Target Date  09/10/19        PT Long Term Goals - 08/20/19 0929      PT LONG TERM GOAL #1   Title  to be set at [facility]             Plan - 08/20/19 0919    Clinical Impression Statement  Pt presents to PT with complaints of Rt low back and leg pain with difficulty bending her knee. S/s are not consistent with neural impingement but pt says it is a pinched nerve and nothing else. Concordant pain upon palpation to Rt SIJ and QL with noted innominate rotation. Rotation corrected with MET today and pt was able to bend her knee but reported some continued pain. We discussed that she should expect to feel sore and pain  will decrease gradually. pt will benefit from skilled PT in order to resolve rotation and improve lumbopelvic stability.    Personal Factors and Comorbidities  Fitness    Examination-Activity Limitations  Bathing;Locomotion Level;Bed Mobility;Bend;Sit;Caring for Others;Carry;Sleep;Squat;Dressing;Stairs;Stand;Lift     Examination-Participation Restrictions  Meal Prep;Cleaning    Stability/Clinical Decision Making  Stable/Uncomplicated    Clinical Decision Making  Low    Rehab Potential  Good    PT Frequency  --   3 visits in first auth followed by 2/week 4 weeks   PT Duration  8 weeks    PT Treatment/Interventions  ADLs/Self Care Home Management;Cryotherapy;Electrical Stimulation;Ultrasound;Traction;Iontophoresis 35m/ml Dexamethasone;Stair training;Moist Heat;Functional mobility training;Therapeutic activities;Therapeutic exercise;Balance training;Patient/family education;Neuromuscular re-education;Manual techniques;Passive range of motion;Dry needling;Spinal Manipulations;Taping;Joint Manipulations    PT Next Visit Plan  check pelvic rotation, lumbopelvic stability    PT Home Exercise Plan  LTR, post pelvic tilt, hooklying iso adduction    Consulted and Agree with Plan of Care  Patient       Patient will benefit from skilled therapeutic intervention in order to improve the following deficits and impairments:  Difficulty walking, Increased muscle spasms, Decreased activity tolerance, Pain, Improper body mechanics, Impaired flexibility, Decreased strength, Postural dysfunction  Visit Diagnosis: Radiculopathy, lumbar region - Plan: PT plan of care cert/re-cert  Chronic right-sided low back pain, unspecified whether sciatica present - Plan: PT plan of care cert/re-cert     Problem List Patient Active Problem List   Diagnosis Date Noted  . Chronic pain of right knee 06/26/2018  . Encounter for initial prescription of Nexplanon 08/21/2016  . S/P repeat low transverse C-section 06/27/2016  . 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 Previous cesarean delivery, antepartum condition or complication 027/02/2375 . Language barrier, cultural differences 04/28/2016  . Advanced maternal age in multigravida     JJanett BillowC. Shyah Cadmus PT, DPT 08/20/19 9:31  AM   [facility] [place] [address]Phone: [phone]  Fax:  [phone] Name: AEavanNor MRN: [id number] of Birth: [birth year]

## 2019-09-02 ENCOUNTER — Ambulatory Visit: Payer: Medicaid Other | Admitting: Physical Therapy

## 2019-09-04 ENCOUNTER — Other Ambulatory Visit: Payer: Self-pay

## 2019-09-04 ENCOUNTER — Ambulatory Visit: Payer: Medicaid Other | Admitting: Physical Therapy

## 2019-09-04 ENCOUNTER — Encounter: Payer: Self-pay | Admitting: Physical Therapy

## 2019-09-04 DIAGNOSIS — G8929 Other chronic pain: Secondary | ICD-10-CM

## 2019-09-04 DIAGNOSIS — M545 Low back pain: Secondary | ICD-10-CM | POA: Diagnosis not present

## 2019-09-04 DIAGNOSIS — M5416 Radiculopathy, lumbar region: Secondary | ICD-10-CM | POA: Diagnosis not present

## 2019-09-04 NOTE — Therapy (Signed)
[place], [place], [address] Phone: [phone]   Fax:  [phone]  Physical Therapy Treatment  Patient Details  Name: Michaela Moran MRN: [id number] Date of Birth: 08-22-1981 Referring Provider (PT): Aundra Dubin, [place]   Encounter Date: 09/04/2019  PT End of Session - 09/04/19 0847    Visit Number  2    Date for PT Re-Evaluation  10/16/19    Authorization Time Period  12/16-12/29    Authorization - Visit Number  1    Authorization - Number of Visits  3    PT Start Time  0845    PT Stop Time  0925    PT Time Calculation (min)  40 min    Activity Tolerance  Patient tolerated treatment well    Behavior During Therapy  [facility] for tasks assessed/performed       Past Medical History:  Diagnosis Date  . Acid reflux   . Female circumcision   . Hemorrhoid   . 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 Hypercholesteremia     Past Surgical History:  Procedure Laterality Date  . CESAREAN SECTION    . CESAREAN SECTION N/A 01/02/2013   Procedure: CESAREAN SECTION;  Surgeon: Donnamae Jude, MD;  Location: [facility];  Service: Obstetrics;  Laterality: N/A;  Incidental Cyystotomy  . CESAREAN SECTION N/A 06/26/2016   Procedure: CESAREAN SECTION;  Surgeon: Aletha Halim, MD;  Location: [place];  Service: Obstetrics;  Laterality: N/A;  . CYSTOTOMY     with repair at 2014 repeat c-section    There were no vitals filed for this visit.  Subjective Assessment - 09/04/19 0848    Subjective  Knee is a little better, I am doing the exercises.    Patient Stated Goals  bending, caring for children, household chores, sleep    Currently in Pain?  Yes    Pain Score  4     Pain Location  Knee    Pain Orientation  Right    Pain Descriptors / Indicators  Sore    Aggravating Factors   bending knee    Pain Relieving Factors  rest         [facility] PT Assessment - 09/04/19 0001      AROM   Overall AROM Comments   knee flexion pain at 116                   [facility] Adult PT Treatment/Exercise - 09/04/19 0001      Lumbar Exercises: Stretches   Passive Hamstring Stretch Limitations  supine hamstring stretch    Quad Stretch Limitations  prone quad stretch    Piriformis Stretch Limitations  supine figure 4      Lumbar Exercises: Supine   Bridge  15 reps      Lumbar Exercises: Sidelying   Clam  Both;20 reps;10 reps    Clam Limitations  constant VC & TC required      Lumbar Exercises: Quadruped   Other Quadruped Lumbar Exercises  quadrupet rocking back to prayer position      Manual Therapy   Manual Therapy  Soft tissue mobilization    Soft tissue mobilization  roller Rt quads, ITB, HS- gave roller for home               PT Short Term Goals - 08/20/19 0923      PT SHORT TERM GOAL #1   Title  pt will  present pelvis in neutral position    Baseline  rotated at eval    Time  3    Period  Weeks    Status  New    Target Date  09/10/19      PT SHORT TERM GOAL #2   Title  pt will be independent with HEP as it has been established in the short term    Baseline  will progress as appropriate    Time  3    Period  Weeks    Status  New    Target Date  09/10/19      PT SHORT TERM GOAL #3   Title  pt will be able to bend with only mild discomfort    Baseline  "excruciating" at eval    Time  3    Period  Weeks    Status  New    Target Date  09/10/19        PT Long Term Goals - 08/20/19 0929      PT LONG TERM GOAL #1   Title  to be set at [facility] PR[facility]            Plan - 09/04/19 0931    Clinical Impression Statement  Frequent and very heavy cuing required throughout session. Has to perofrm a deep bend in her knee for praying- we worked on quadruped rocking with pillow behind knees for support. Asked her to spread her knees and sit straight rather than crooked. Pt is planning to purchase a muscle roller.    PT Treatment/Interventions  ADLs/Self Care Home  Management;Cryotherapy;Electrical Stimulation;Ultrasound;Traction;Iontophoresis 4mg /ml Dexamethasone;Stair training;Moist Heat;Functional mobility training;Therapeutic activities;Therapeutic exercise;Balance training;Patient/family education;Neuromuscular re-education;Manual techniques;Passive range of motion;Dry needling;Spinal Manipulations;Taping;Joint Manipulations    PT Home Exercise Plan  LTR, post pelvic tilt, hooklying iso adduction, supine HSS, prone quad stretch, figure 4 stretch    Consulted and Agree with Plan of Care  Patient       Patient will benefit from skilled therapeutic intervention in order to improve the following deficits and impairments:  Difficulty walking, Increased muscle spasms, Decreased activity tolerance, Pain, Improper body mechanics, Impaired flexibility, Decreased strength, Postural dysfunction  Visit Diagnosis: Radiculopathy, lumbar region  Chronic right-sided low back pain, unspecified whether sciatica present     Problem List Patient Active Problem List   Diagnosis Date Noted  . Chronic pain of right knee 06/26/2018  . Encounter for initial prescription of Nexplanon 08/21/2016  . S/P repeat low transverse C-section 06/27/2016  . 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 Previous cesarean delivery, antepartum condition or complication 123XX123  . Language barrier, cultural differences 04/28/2016  . Advanced maternal age in multigravida     Janett Billow C. Legacie Dillingham PT, DPT 09/04/19 9:36 AM   [facility] [address] [place], [place], [address] Phone: [phone]   Fax:  [phone]  Name: Dangela Portee MRN: [id number] Date of Birth: February 20, 1981

## 2019-09-07 ENCOUNTER — Ambulatory Visit: Payer: Medicaid Other | Admitting: Physical Therapy

## 2019-09-08 ENCOUNTER — Encounter: Payer: Medicaid Other | Admitting: Physical Therapy

## 2019-09-10 ENCOUNTER — Ambulatory Visit: Payer: Medicaid Other | Admitting: Physical Therapy

## 2019-09-10 ENCOUNTER — Other Ambulatory Visit: Payer: Self-pay

## 2019-09-10 ENCOUNTER — Encounter: Payer: Self-pay | Admitting: Physical Therapy

## 2019-09-10 DIAGNOSIS — M545 Low back pain, unspecified: Secondary | ICD-10-CM

## 2019-09-10 DIAGNOSIS — G8929 Other chronic pain: Secondary | ICD-10-CM | POA: Diagnosis not present

## 2019-09-10 DIAGNOSIS — M5416 Radiculopathy, lumbar region: Secondary | ICD-10-CM | POA: Diagnosis not present

## 2019-09-10 NOTE — Therapy (Signed)
Michaela Moran, [place], [address] Phone: [phone]   Fax:  [phone]  Physical Therapy Treatment  Patient Details  Name: Michaela Moran MRN: [id number] Date of Birth: Apr 02, 1981 Referring Provider (Moran): Aundra Dubin, [place]   Encounter Date: 09/10/2019  Moran End of Session - 09/10/19 1104    Visit Number  3    Date for Moran Re-Evaluation  10/16/19    Authorization Type  MCD   auth submitted 12/3    Authorization Time Period  12/16-12/29    Authorization - Visit Number  2    Authorization - Number of Visits  3    Moran Start Time  1103    Moran Stop Time  1141    Moran Time Calculation (min)  38 min    Activity Tolerance  Patient tolerated treatment well    Behavior During Therapy  [facility] for tasks assessed/performed       Past Medical History:  Diagnosis Date  . Acid reflux   . Female circumcision   . Hemorrhoid   . 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 Hypercholesteremia     Past Surgical History:  Procedure Laterality Date  . CESAREAN SECTION    . CESAREAN SECTION N/A 01/02/2013   Procedure: CESAREAN SECTION;  Surgeon: Donnamae Jude, MD;  Location: [facility];  Service: Obstetrics;  Laterality: N/A;  Incidental Cyystotomy  . CESAREAN SECTION N/A 06/26/2016   Procedure: CESAREAN SECTION;  Surgeon: Aletha Halim, MD;  Location: [place];  Service: Obstetrics;  Laterality: N/A;  . CYSTOTOMY     with repair at 2014 repeat c-section    There were no vitals filed for this visit.  Subjective Assessment - 09/10/19 1106    Subjective  Exercises helped the hip a lot, the knee still hurts very bad.                       [facility] Treatment/Exercise - 09/10/19 0001      Lumbar Exercises: Stretches   Gastroc Stretch  Right;Left;2 reps;30 seconds      Lumbar Exercises: Aerobic   Stationary Bike  5 min      Lumbar Exercises: Standing   Other Standing Lumbar Exercises  heel-toe gait  pattern & knee flx in swing through      Modalities   Modalities  Ultrasound      Ultrasound   Ultrasound Location  rt knee    Ultrasound Parameters  .8w/cm2 pulsed    Ultrasound Goals  Pain;Edema               Moran Short Term Goals - 08/20/19 0923      Moran SHORT TERM GOAL #1   Title  Moran will present pelvis in neutral position    Baseline  rotated at eval    Time  3    Period  Weeks    Status  New    Target Date  09/10/19      Moran SHORT TERM GOAL #2   Title  Moran will be independent with HEP as it has been established in the short term    Baseline  will progress as appropriate    Time  3    Period  Weeks    Status  New    Target Date  09/10/19      Moran SHORT TERM GOAL #3   Title  Moran will be able  to bend with only mild discomfort    Baseline  "excruciating" at eval    Time  3    Period  Weeks    Status  New    Target Date  09/10/19        Moran Long Term Goals - 08/20/19 0929      Moran LONG TERM GOAL #1   Title  to be set at [facility] PRN            Plan - 09/10/19 1106    Clinical Impression Statement  avoiding heel-toe and weight distributed from lateral to medial foot- heavy cuing and demo required. Notable edema in Rt knee. [place] utilized which she reported some decrease in pain. Very heavy cuing for gait pattern- used //bars and mirror, treadmill with Moran guiding as well as demo around gym. Reported improvements with changes.    Moran Treatment/Interventions  ADLs/Self Care Home Management;Cryotherapy;Electrical Stimulation;Ultrasound;Traction;Iontophoresis 4mg /ml Dexamethasone;Stair training;Moist Heat;Functional mobility training;Therapeutic activities;Therapeutic exercise;Balance training;Patient/family education;Neuromuscular re-education;Manual techniques;Passive range of motion;Dry needling;Spinal Manipulations;Taping;Joint Manipulations    Moran Next Visit Plan  [facility]    Moran Home Exercise Plan  LTR, post pelvic tilt, hooklying iso adduction, supine HSS, prone quad  stretch, figure 4 stretch, heel-toe gait    Consulted and Agree with Plan of Care  Patient       Patient will benefit from skilled therapeutic intervention in order to improve the following deficits and impairments:  Difficulty walking, Increased muscle spasms, Decreased activity tolerance, Pain, Improper body mechanics, Impaired flexibility, Decreased strength, Postural dysfunction  Visit Diagnosis: Radiculopathy, lumbar region  Chronic right-sided low back pain, unspecified whether sciatica present     Problem List Patient Active Problem List   Diagnosis Date Noted  . Chronic pain of right knee 06/26/2018  . Encounter for initial prescription of Nexplanon 08/21/2016  . S/P repeat low transverse C-section 06/27/2016  . 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 Previous cesarean delivery, antepartum condition or complication 123XX123  . Language barrier, cultural differences 04/28/2016  . Advanced maternal age in multigravida     Janett Billow C. Michaela Moran, DPT 09/10/19 11:45 AM   [place] [address] [place], [place], [address] Phone: [phone]   Fax:  [phone]  Name: Michaela Moran MRN: [id number] Date of Birth: 1981/07/02

## 2019-09-22 ENCOUNTER — Ambulatory Visit: Payer: Medicaid Other | Admitting: Orthopaedic Surgery

## 2019-09-25 ENCOUNTER — Telehealth: Payer: Self-pay | Admitting: Physical Therapy

## 2019-09-25 ENCOUNTER — Ambulatory Visit: Payer: Medicaid Other | Attending: Physician Assistant | Admitting: Physical Therapy

## 2019-09-25 NOTE — Telephone Encounter (Signed)
Requested pt contacted via [employer] to advise of missed appointment today and next appointment time.  Quindarrius Joplin C. Joeann Steppe PT, DPT 09/25/19 12:20 PM

## 2019-09-29 ENCOUNTER — Ambulatory Visit: Payer: Medicaid Other | Admitting: Orthopaedic Surgery

## 2019-10-02 ENCOUNTER — Ambulatory Visit: Payer: Medicaid Other | Admitting: Physical Therapy

## 2019-10-09 ENCOUNTER — Telehealth: Payer: Self-pay | Admitting: Physical Therapy

## 2019-10-09 ENCOUNTER — Ambulatory Visit: Payer: Medicaid Other | Admitting: Physical Therapy

## 2019-10-09 NOTE — Telephone Encounter (Signed)
Spoke with pt via [employer] interpreter services. She says she did not know she had a PT appointment today. Was getting her phone fixed and does see where we left her a message when she missed her last appointment. She says she does still need PT. I advised her of her next appointment time and if she NS again she will be d/Moran from therapy per our attendance policy. Pt verbalized understanding.  Michaela Moran. Michaela Ishmael PT, DPT 10/09/19 10:34 AM

## 2019-10-13 ENCOUNTER — Other Ambulatory Visit: Payer: Self-pay

## 2019-10-13 ENCOUNTER — Ambulatory Visit (INDEPENDENT_AMBULATORY_CARE_PROVIDER_SITE_OTHER): Payer: Medicaid Other | Admitting: Orthopaedic Surgery

## 2019-10-13 DIAGNOSIS — R768 Other specified abnormal immunological findings in serum: Secondary | ICD-10-CM | POA: Diagnosis not present

## 2019-10-13 DIAGNOSIS — M25561 Pain in right knee: Secondary | ICD-10-CM

## 2019-10-13 DIAGNOSIS — G8929 Other chronic pain: Secondary | ICD-10-CM

## 2019-10-13 DIAGNOSIS — M2550 Pain in unspecified joint: Secondary | ICD-10-CM

## 2019-10-13 NOTE — Progress Notes (Signed)
Office Visit Note   [name]           Date of Birth: 17-Jun-1981           MRN: [id number] Visit Date: 10/13/2019              Requested by: Gildardo Pounds, NP [place],  [place] [address] PCP: Gildardo Pounds, NP   Assessment & Plan: Visit Diagnoses:  1. Chronic pain of right knee   2. ANA positive     Plan: My impression is chronic right knee pain with weightbearing and standing and positive ANA on recent arthritis panel.  We will refer her to Dr.  Estanislado Pandy for work-up of possible underlying autoimmune disorder.  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 Estanislado Pandy  Follow-Up Instructions: Return if symptoms worsen or fail to improve.   Orders:  No orders of the defined types were placed in this encounter.  No orders of the defined types were placed in this encounter.     Procedures: No procedures performed   Clinical Data: No additional findings.   Subjective: Chief Complaint  Patient presents with  . Right Knee - Pain    Patient returns today for evaluation of chronic right knee pain.  She states that she is in a lot of pain when she is standing at work.  She does not have any pain with sitting and nonweightbearing.  Her arthritis panel was positive for ANA.   Review of Systems  Constitutional: Negative.   HENT: Negative.   Eyes: Negative.   Respiratory: Negative.   Cardiovascular: Negative.   Endocrine: Negative.   Musculoskeletal: Negative.   Neurological: Negative.   Hematological: Negative.   Psychiatric/Behavioral: Negative.   All other systems reviewed and are negative.    Objective: Vital Signs: There were no vitals taken for this visit.  Physical Exam Vitals and nursing note reviewed.  Constitutional:      Appearance: She is well-developed.  Pulmonary:     Effort: Pulmonary effort is normal.  Skin:    General: Skin is warm.     Capillary Refill: Capillary refill takes less  than 2 seconds.  Neurological:     Mental Status: She is alert and oriented to person, place, and time.  Psychiatric:        Behavior: Behavior normal.        Thought Content: Thought content normal.        Judgment: Judgment normal.     Ortho Exam Right knee exam shows no joint effusion.  Mild discomfort with range of motion of the knee which is normal.  Collaterals and cruciates are stable. Specialty Comments:  No specialty comments available.  Imaging: No results found.   PMFS History: Patient Active Problem List   Diagnosis Date Noted  . Chronic pain of right knee 06/26/2018  . Encounter for initial prescription of Nexplanon 08/21/2016  . S/P repeat low transverse C-section 06/27/2016  . 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 Previous cesarean delivery, antepartum condition or complication 123XX123  . Language barrier, cultural differences 04/28/2016  . Advanced maternal age in multigravida    Past Medical History:  Diagnosis Date  . Acid reflux   . Female circumcision   . Hemorrhoid   . 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 Hypercholesteremia     Family History  Problem Relation Age of  Onset  . Diabetes Mother   . Diabetes Sister   . Diabetes Brother   . Hypertension Brother   . Stroke Maternal Aunt   . Other Neg Hx     Past Surgical History:  Procedure Laterality Date  . CESAREAN SECTION    . CESAREAN SECTION N/A 01/02/2013   Procedure: CESAREAN SECTION;  Surgeon: Donnamae Jude, MD;  Location: [facility];  Service: Obstetrics;  Laterality: N/A;  Incidental Cyystotomy  . CESAREAN SECTION N/A 06/26/2016   Procedure: CESAREAN SECTION;  Surgeon: Aletha Halim, MD;  Location: [facility];  Service: Obstetrics;  Laterality: N/A;  . CYSTOTOMY     with repair at 2014 repeat c-section   Social History   Occupational History  . Not on file  Tobacco Use  . Smoking status: Never Smoker  .  Smokeless tobacco: Never Used  Substance and Sexual Activity  . Alcohol use: No  . Drug use: No  . Sexual activity: Yes    Birth control/protection: Implant

## 2019-10-16 ENCOUNTER — Ambulatory Visit: Payer: Medicaid Other | Admitting: Physical Therapy

## 2019-10-27 ENCOUNTER — Ambulatory Visit: Payer: Medicaid Other | Attending: Nurse Practitioner | Admitting: Nurse Practitioner

## 2019-10-27 ENCOUNTER — Other Ambulatory Visit: Payer: Self-pay

## 2019-10-30 ENCOUNTER — Ambulatory Visit: Payer: Medicaid Other | Admitting: Physical Therapy

## 2019-11-18 ENCOUNTER — Other Ambulatory Visit: Payer: Self-pay

## 2019-11-18 ENCOUNTER — Encounter: Payer: Self-pay | Admitting: Physical Therapy

## 2019-11-18 ENCOUNTER — Ambulatory Visit: Payer: Medicaid Other | Attending: Physician Assistant | Admitting: Physical Therapy

## 2019-11-18 DIAGNOSIS — G8929 Other chronic pain: Secondary | ICD-10-CM | POA: Diagnosis not present

## 2019-11-18 DIAGNOSIS — M5416 Radiculopathy, lumbar region: Secondary | ICD-10-CM | POA: Diagnosis not present

## 2019-11-18 DIAGNOSIS — M25562 Pain in left knee: Secondary | ICD-10-CM | POA: Diagnosis not present

## 2019-11-18 DIAGNOSIS — M545 Low back pain: Secondary | ICD-10-CM | POA: Diagnosis not present

## 2019-11-18 NOTE — Therapy (Addendum)
[place], [place], [address] Phone: [phone]   Fax:  [phone]  Physical Therapy Treatment/ERO/discharge   Patient Details  Name: Michaela Moran MRN: [id number] Date of Birth: [birth year] Referring Provider (PT): Aundra Dubin, [place]   Encounter Date: 11/18/2019  PT End of Session - 11/18/19 1103    Visit Number  4    Date for PT Re-Evaluation  01/22/20    Authorization Type  MCD auth submitted 3/3    PT Start Time  1103    PT Stop Time  1127    PT Time Calculation (min)  24 min    Activity Tolerance  Patient tolerated treatment well    Behavior During Therapy  [facility] for tasks assessed/performed       Past Medical History:  Diagnosis Date  . Acid reflux   . Female circumcision   . Hemorrhoid   . 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 Hypercholesteremia     Past Surgical History:  Procedure Laterality Date  . CESAREAN SECTION    . CESAREAN SECTION N/A 01/02/2013   Procedure: CESAREAN SECTION;  Surgeon: Donnamae Jude, MD;  Location: [facility];  Service: Obstetrics;  Laterality: N/A;  Incidental Cyystotomy  . CESAREAN SECTION N/A 06/26/2016   Procedure: CESAREAN SECTION;  Surgeon: Aletha Halim, MD;  Location: [facility];  Service: Obstetrics;  Laterality: N/A;  . CYSTOTOMY     with repair at 2014 repeat c-section    There were no vitals filed for this visit.  Subjective Assessment - 11/18/19 1104    Subjective  Had to cancel in past due to HA and school being cancelled. My left knee is starting to hurt too. It is better than before. After two hours of standing at work I feel it and sit. The pain is in right lower back.    Patient Stated Goals  bending, caring for children, household chores, sleep    Currently in Pain?  Yes    Pain Score  3     Pain Location  Neck    Pain Orientation  Right    Pain Descriptors / Indicators  Sore    Aggravating Factors   walking over 2 hours    Pain  Relieving Factors  sit down.         [facility] PT Assessment - 11/18/19 0001      Assessment   Medical Diagnosis  Rt lumbar radiculopathy    Referring Provider (PT)  Aundra Dubin, PA-C    Onset Date/Surgical Date  --   2019, flare June 2020   Hand Dominance  Right    Next MD Visit  Reumatologist 3/24      Precautions   Precautions  None      Restrictions   Weight Bearing Restrictions  No      Balance Screen   Has the patient fallen in the past 6 months  No      [place] residence    Living Arrangements  Spouse/significant other;Children    Additional Comments  stairs at home      Prior Function   Level of Guys Requirements  work at [employer], Electrical engineer   Overall Cognitive Status  Within Functional Limits for tasks assessed      Sensation   Additional Comments  [facility]  AROM   AROM Assessment Site  Knee    Right/Left Knee  Right    Right Knee Extension  0    Right Knee Flexion  120      Strength   Strength Assessment Site  Hip;Knee    Right/Left Hip  Right;Left    Right Hip Flexion  3+/5    Right Hip Extension  4+/5    Right Hip ABduction  4-/5    Left Hip Flexion  3+/5    Left Hip Extension  4+/5    Left Hip ABduction  4+/5    Right/Left Knee  Right;Left    Right Knee Flexion  4/5   pain reported   Right Knee Extension  4/5   pain reported   Left Knee Flexion  5/5    Left Knee Extension  5/5      Palpation   Palpation comment  did not complain of pain upon palpation to knee or thigh                   [facility] Adult PT Treatment/Exercise - 11/18/19 0001      Manual Therapy   Manual therapy comments  edu on self use of roller               PT Short Term Goals - 11/18/19 1110      PT SHORT TERM GOAL #1   Title  pt will present pelvis in neutral position    Status  Achieved      PT SHORT TERM GOAL #2   Title  pt will be independent  with HEP as it has been established in the short term    Status  Achieved      PT SHORT TERM GOAL #3   Title  pt will be able to bend with only mild discomfort    Status  Achieved        PT Long Term Goals - 11/18/19 1111      PT LONG TERM GOAL #1   Title  Pt will be able to sleep    Baseline  sometimes I sleep on Right side and it hurs      PT LONG TERM GOAL #2   Title  pt will be able to work on feet until lunch    Baseline  I can walk about 2 hours      PT LONG TERM GOAL #3   Title  able to demonstrate non-antalgic gait    Baseline  antalgic on Right side today      PT LONG TERM GOAL #4   Title  pt will report leg doesn't feel as tight    Baseline  tightness reported to day in quads.            Plan - 11/18/19 1129    Clinical Impression Statement  Pt returns to PT for re-evaluation to continue treatment for knee pain and lumbar radiculopathy. Presents weakness in proximal and distal musculature that creates lack of support for knee and back. Discussed importance of not limping and she is currently doing her HEP. Will progress exercises as appropriate moving forward to meet functional goals.    PT Frequency  1x / week    PT Duration  8 weeks    PT Treatment/Interventions  ADLs/Self Care Home Management;Cryotherapy;Electrical Stimulation;Ultrasound;Traction;Iontophoresis '4mg'$ /ml Dexamethasone;Stair training;Moist Heat;Functional mobility training;Therapeutic activities;Therapeutic exercise;Balance training;Patient/family education;Neuromuscular re-education;Manual techniques;Passive range of motion;Dry needling;Spinal Manipulations;Taping;Joint Manipulations    PT Next Visit Plan  bike,  quad stretching    PT Home Exercise Plan  LTR, post pelvic tilt, hooklying iso adduction, supine HSS, prone quad stretch, figure 4 stretch, heel-toe gait, self- massage with roller    Consulted and Agree with Plan of Care  Patient       Patient will benefit from skilled therapeutic  intervention in order to improve the following deficits and impairments:  Difficulty walking, Increased muscle spasms, Decreased activity tolerance, Pain, Improper body mechanics, Impaired flexibility, Decreased strength, Postural dysfunction  Visit Diagnosis: Radiculopathy, lumbar region - Plan: PT plan of care cert/re-cert  Chronic pain of left knee - Plan: PT plan of care cert/re-cert  Chronic right-sided low back pain, unspecified whether sciatica present - Plan: PT plan of care cert/re-cert     Problem List Patient Active Problem List   Diagnosis Date Noted  . Chronic pain of right knee 06/26/2018  . Encounter for initial prescription of Nexplanon 08/21/2016  . S/P repeat low transverse C-section 06/27/2016  . 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 Previous cesarean delivery, antepartum condition or complication 53/64/6803  . Language barrier, cultural differences 04/28/2016  . Advanced maternal age in multigravida    Janett Billow C. Nyrie Sigal PT, DPT 11/18/19 11:33 AM   [facility] [address] [place], [place], [address] Phone: [phone]   Fax:  [phone]  Name: Alixis Haupert MRN: [id number] Date of Birth: [birth year]  PHYSICAL THERAPY DISCHARGE SUMMARY  Visits from Start of Care: 4  Current functional level related to goals / functional outcomes: See above   Remaining deficits: See above   Education / Equipment: Anatomy of condition, POC, HEP, exercise form/rationale  Plan: Patient agrees to discharge.  Patient goals were partially met. Patient is being discharged due to not returning since the last visit.  ?????    Pt left VM to cancel last scheduled appointment.  Afnan Cadiente C. Devon Kingdon PT, DPT 02/17/20 10:49 AM

## 2019-12-03 NOTE — Progress Notes (Signed)
Office Visit Note  [name]             Date of Birth: 02-Apr-1981           MRN: [id number]             PCP: Gildardo Pounds, NP Referring: Gildardo Pounds, NP Visit Date: 12/09/2019 Occupation: '@GUAROCC'$ @  Subjective:  Pain in right knee.   History of Present Illness: Michaela Moran is a 39 y.o. female seen in consultation per request of Dr.Xu.  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  She landed up on her knee.  She went to the emergency room at the time she was told that the x-rays were normal and she was sent home.  She tried massage therapy but the symptoms persist.  She went for a follow-up visit to the emergency room and referred her to Dr. Sherrian Divers.  He evaluated her and gave her a cortisone injection which was helpful.  Then the symptoms recurred and he aspirated her knee joint and and again another shot which was not helpful.  He also did x-rays and MRI of her knee joint.  He offered surgery which she declined.  She also was found to have some disc disease in the lumbar spine and had some injections in her lumbar spine per patient.  She states the injections were not helpful.  She was referred to physical therapy which has been helpful to some extent.  None of the other joints are painful.  Activities of Daily Living:  Patient reports morning stiffness for 0 minutes.   Patient Reports nocturnal pain.  Difficulty dressing/grooming: Denies Difficulty climbing stairs: Denies Difficulty getting out of chair: Denies Difficulty using hands for taps, buttons, cutlery, and/or writing: Denies  Review of Systems  Constitutional: Positive for fatigue.  HENT: Negative for mouth sores, mouth dryness and nose dryness.   Eyes: Negative for itching and dryness.  Respiratory: Positive for shortness of breath and difficulty breathing.   Cardiovascular: Negative for chest pain and palpitations.  Gastrointestinal: Negative for blood in stool,  constipation and diarrhea.  Endocrine: Negative for increased urination.  Genitourinary: Negative for difficulty urinating and painful urination.  Musculoskeletal: Positive for arthralgias, joint pain, joint swelling, myalgias, muscle tenderness and myalgias. Negative for morning stiffness.  Skin: Negative for rash and redness.  Allergic/Immunologic: Negative for susceptible to infections.  Neurological: Negative for dizziness, numbness, headaches and weakness.  Hematological: Negative for swollen glands.  Psychiatric/Behavioral: Positive for sleep disturbance. Negative for confusion.    PMFS History:  Patient Active Problem List   Diagnosis Date Noted  . Chronic pain of right knee 06/26/2018  . Encounter for initial prescription of Nexplanon 08/21/2016  . S/P repeat low transverse C-section 06/27/2016  . 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 Previous cesarean delivery, antepartum condition or complication 23/76/2831  . Language barrier, cultural differences 04/28/2016  . Advanced maternal age in multigravida     Past Medical History:  Diagnosis Date  . Acid reflux   . Female circumcision   . Hemorrhoid   . 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 Hypercholesteremia     Family History  Problem Relation Age of Onset  . Diabetes Mother   . Diabetes Sister   . Diabetes Brother   . Hypertension Brother   . Diabetes Sister   . Diabetes Sister   . Autism  Son   . Duwayne Heck Son   . Healthy Daughter   . Other Neg Hx    Past Surgical History:  Procedure Laterality Date  . CESAREAN SECTION    . CESAREAN SECTION N/A 01/02/2013   Procedure: CESAREAN SECTION;  Surgeon: Donnamae Jude, MD;  Location: [facility];  Service: Obstetrics;  Laterality: N/A;  Incidental Cyystotomy  . CESAREAN SECTION N/A 06/26/2016   Procedure: CESAREAN SECTION;  Surgeon: Aletha Halim, MD;  Location: [facility];  Service: Obstetrics;  Laterality:  N/A;  . CYSTOTOMY     with repair at 2014 repeat c-section   Social History   Social History Narrative  . Not on file   Immunization History  Administered Date(s) Administered  . Influenza,inj,Quad PF,6+ Mos 06/28/2016     Objective: Vital Signs: BP 108/77 (BP Location: Right Arm, Patient Position: Sitting, Cuff Size: Normal)   Pulse 79   Resp 13   Ht _0  (1.676 m)   Wt 177 lb (80.3 kg)   BMI 28.57 kg/m    Physical Exam Vitals and nursing note reviewed.  Constitutional:      Appearance: She is well-developed.  HENT:     Head: Normocephalic and atraumatic.  Eyes:     Conjunctiva/sclera: Conjunctivae normal.  Cardiovascular:     Rate and Rhythm: Normal rate and regular rhythm.     Heart sounds: Normal heart sounds.  Pulmonary:     Effort: Pulmonary effort is normal.     Breath sounds: Normal breath sounds.  Abdominal:     General: Bowel sounds are normal.     Palpations: Abdomen is soft.  Musculoskeletal:     Cervical back: Normal range of motion.  Lymphadenopathy:     Cervical: No cervical adenopathy.  Skin:    General: Skin is warm and dry.     Capillary Refill: Capillary refill takes less than 2 seconds.  Neurological:     Mental Status: She is alert and oriented to person, place, and time.  Psychiatric:        Behavior: Behavior normal.      Musculoskeletal Exam: C-spine thoracic and lumbar spine with good range of motion.  She has some discomfort range of motion of the lumbar spine.  She has mild tenderness over right SI joint.  Shoulder joints, elbow joints, wrist joints, MCPs, PIPs and DIPs with good range of motion with no synovitis.  Hip joints with good range of motion.  She had large effusion in her right knee joint.  Left knee joint was in good range of motion.  Ankle joints MTPs PIPs with good range of motion with no synovitis.  CDAI Exam: CDAI Score: -- Patient Global: --; Provider Global: -- Swollen: --; Tender: -- Joint Exam 12/09/2019    No joint exam has been documented for this visit   There is currently no information documented on the homunculus. Go to the Rheumatology activity and complete the homunculus joint exam.  Investigation: No additional findings.  Imaging: No results found.  Recent Labs: Lab Results  Component Value Date   WBC 7.4 05/11/2019   HGB 13.6 05/11/2019   PLT 286 05/11/2019   NA 138 05/11/2019   K 3.8 05/11/2019   CL 99 05/11/2019   CO2 24 05/11/2019   GLUCOSE 99 05/11/2019   BUN 13 05/11/2019   CREATININE 0.74 05/11/2019   BILITOT 0.2 05/11/2019   ALKPHOS 73 05/11/2019   AST 11 05/11/2019   ALT 9 05/11/2019   PROT 7.0  05/11/2019   ALBUMIN 4.3 05/11/2019   CALCIUM 9.0 05/11/2019   GFRAA 118 05/11/2019  IMPRESSION: 1. Increased signal in the posterior horn of the medial meniscus consistent with degeneration. Subtle linear signal abnormality in the body of the medial meniscus extending towards the free edge concerning for a small tear (image 16/series 6). 2. Maceration of the anterior horn of the lateral meniscus. Subtle linear signal abnormality in the body of the lateral meniscus extending to the free edge concerning for a small tear (image 13/series 6). 3. Tricompartmental cartilage abnormalities as described above. 4. Intact ACL with severe mucinous degeneration. 5. Large joint effusion.   Electronically Signed   By: Kathreen Devoid   On: 06/15/2019 13:24  IMPRESSION: New right paracentral disc protrusion at L4-5 resulting in moderate right subarticular recess stenosis and mild right foraminal stenosis. Correlate for radicular symptoms in the right L5 and right L4 nerve root distributions. There is no canal stenosis of the lumbar spine.   Electronically Signed   By: Davina Poke M.D.   December 11, 2019 synovial fluid showed WBC 924, no crystals are present.   On: 06/15/2019 13:59  Speciality Comments: No specialty comments available.  Procedures:  No  procedures performed Allergies: Other   Assessment / Plan:     Visit Diagnoses: Positive ANA (antinuclear antibody) - 06/18/19: ANA 1:40 cytoplasmic, RF<14, ESR 14, uric acid 4.4.  Patient was sent to me because of positive ANA.  Her ANA is low titer and not significant.  She has no other clinical features of systemic lupus.  No further work-up is required.  Chronic pain of right knee -she has chronic pain and discomfort in her right knee since her injury.  She has large effusion.  I also reviewed her x-rays and MRI reports.  She has meniscal tear injury.  We had detailed discussion regarding having arthroscopic surgery.  She will have to schedule a follow-up appointment with Dr.Xu.  To complete the work-up I will obtain some additional labs today.  Plan: Cyclic citrul peptide antibody, IgG, 14-3-3 eta Protein, HLA-B27 antigen.  The synovial fluid analysis was not inflammatory.  There were no crystals present.  She has been having recurrent effusion.  Chronic SI joint pain -she complains of right SI joint.  I believe it is mechanical due to right knee joint arthritis.  I will obtain x-rays today.  Plan: XR Pelvis 1-2 Views.  X-ray of the SI joints were unremarkable.  DDD (degenerative disc disease), lumbar-I also reviewed MRI of her lumbar spine which was consistent with some degenerative changes.  She had no relief from epidural injection.  Vitamin D deficiency-she has history of vitamin D deficiency.  She is not taking vitamin D.  S/P repeat low transverse C-section  Pelvic adhesive disease  Language barrier, cultural differences-I detailed discussion with patient and her husband who was over the phone.  Orders: Orders Placed This Encounter  Procedures  . XR Pelvis 1-2 Views  . Cyclic citrul peptide antibody, IgG  . 14-3-3 eta Protein  . HLA-B27 antigen   No orders of the defined types were placed in this encounter.   Face-to-face time spent with patient was 60 minutes. Greater than  50% of time was spent in counseling and coordination of care.  Follow-Up Instructions: Return for Right knee swelling, lower back pain.   Bo Merino, MD  Note - This record has been created using Editor, commissioning.  Chart creation errors have been sought, but may not always  have  been located. Such creation errors do not reflect on  the standard of medical care.

## 2019-12-04 ENCOUNTER — Ambulatory Visit: Payer: Medicaid Other | Admitting: Physical Therapy

## 2019-12-09 ENCOUNTER — Encounter: Payer: Self-pay | Admitting: Rheumatology

## 2019-12-09 ENCOUNTER — Ambulatory Visit: Payer: Self-pay

## 2019-12-09 ENCOUNTER — Ambulatory Visit: Payer: Medicaid Other | Admitting: Rheumatology

## 2019-12-09 ENCOUNTER — Other Ambulatory Visit: Payer: Self-pay

## 2019-12-09 VITALS — BP 108/77 | HR 79 | Resp 13 | Ht 66.0 in | Wt 177.0 lb

## 2019-12-09 DIAGNOSIS — M533 Sacrococcygeal disorders, not elsewhere classified: Secondary | ICD-10-CM

## 2019-12-09 DIAGNOSIS — M5136 Other intervertebral disc degeneration, lumbar region: Secondary | ICD-10-CM | POA: Diagnosis not present

## 2019-12-09 DIAGNOSIS — N736 Female pelvic peritoneal adhesions (postinfective): Secondary | ICD-10-CM | POA: Diagnosis not present

## 2019-12-09 DIAGNOSIS — Z603 Acculturation difficulty: Secondary | ICD-10-CM | POA: Diagnosis not present

## 2019-12-09 DIAGNOSIS — M25561 Pain in right knee: Secondary | ICD-10-CM

## 2019-12-09 DIAGNOSIS — Z98891 History of uterine scar from previous surgery: Secondary | ICD-10-CM

## 2019-12-09 DIAGNOSIS — E559 Vitamin D deficiency, unspecified: Secondary | ICD-10-CM

## 2019-12-09 DIAGNOSIS — G8929 Other chronic pain: Secondary | ICD-10-CM

## 2019-12-09 DIAGNOSIS — R768 Other specified abnormal immunological findings in serum: Secondary | ICD-10-CM

## 2019-12-11 ENCOUNTER — Ambulatory Visit: Payer: Medicaid Other | Admitting: Physical Therapy

## 2019-12-15 LAB — CYCLIC CITRUL PEPTIDE ANTIBODY, IGG: Cyclic Citrullin Peptide Ab: <16 UNITS

## 2019-12-15 LAB — 14-3-3 ETA PROTEIN: 14-3-3 eta Protein: <0.2 ng/mL (ref <0.2)

## 2019-12-15 LAB — HLA-B27 ANTIGEN: HLA-B27 Antigen: NEGATIVE

## 2019-12-23 ENCOUNTER — Encounter: Payer: Self-pay | Admitting: Orthopaedic Surgery

## 2019-12-23 ENCOUNTER — Other Ambulatory Visit: Payer: Self-pay

## 2019-12-23 ENCOUNTER — Ambulatory Visit (INDEPENDENT_AMBULATORY_CARE_PROVIDER_SITE_OTHER): Payer: Medicaid Other | Admitting: Orthopaedic Surgery

## 2019-12-23 DIAGNOSIS — M5416 Radiculopathy, lumbar region: Secondary | ICD-10-CM

## 2019-12-23 MED ORDER — MELOXICAM 15 MG PO TABS: 7.5000 mg | ORAL_TABLET | Freq: Every day | ORAL | 0 refills | Status: DC

## 2019-12-23 MED ORDER — TRAMADOL HCL 50 MG PO TABS: 50.0000 mg | ORAL_TABLET | Freq: Every day | ORAL | 2 refills | Status: DC | PRN

## 2019-12-23 NOTE — Progress Notes (Signed)
Office Visit Note   [name]           Date of Birth: Dec 10, 1980           MRN: [id number] Visit Date: 12/23/2019              Requested by: Gildardo Pounds, NP [place],  [place] [address] PCP: Gildardo Pounds, NP   Assessment & Plan: Visit Diagnoses:  1. Lumbar radiculopathy     Plan: My impression is lumbar radiculopathy of the right leg.  At this point given lack of relief from conservative treatment we have agreed to refer her to Dr. Kathyrn Sheriff for further evaluation and treatment for her continued radiculopathy.  Mobic and tramadol refill today.  Work note for sitdown work provided today.  Follow-up as needed.  Follow-Up Instructions: Return if symptoms worsen or fail to improve.   Orders:  No orders of the defined types were placed in this encounter.  Meds ordered this encounter  Medications  . meloxicam (MOBIC) 15 MG tablet    Sig: Take 0.5-1 tablets (7.5-15 mg total) by mouth daily.    Dispense:  30 tablet    Refill:  0  . traMADol (ULTRAM) 50 MG tablet    Sig: Take 1-2 tablets (50-100 mg total) by mouth daily as needed.    Dispense:  30 tablet    Refill:  2      Procedures: No procedures performed   Clinical Data: No additional findings.   Subjective: Chief Complaint  Patient presents with  . Lower Back - Pain  . Right Knee - Pain    Patient returns today for continued low back pain and right leg radiculopathy.  She has not had much relief from her prior lumbar ESI.  She is asking for sitdown work only.   Review of Systems  Constitutional: Negative.   HENT: Negative.   Eyes: Negative.   Respiratory: Negative.   Cardiovascular: Negative.   Endocrine: Negative.   Musculoskeletal: Negative.   Neurological: Negative.   Hematological: Negative.   Psychiatric/Behavioral: Negative.   All other systems reviewed and are negative.    Objective: Vital Signs: There were no vitals taken for this visit.  Physical  Exam Vitals and nursing note reviewed.  Constitutional:      Appearance: She is well-developed.  Pulmonary:     Effort: Pulmonary effort is normal.  Skin:    General: Skin is warm.     Capillary Refill: Capillary refill takes less than 2 seconds.  Neurological:     Mental Status: She is alert and oriented to person, place, and time.  Psychiatric:        Behavior: Behavior normal.        Thought Content: Thought content normal.        Judgment: Judgment normal.     Ortho Exam Exam is unchanged.  No focal weakness. Specialty Comments:  No specialty comments available.  Imaging: No results found.   PMFS History: Patient Active Problem List   Diagnosis Date Noted  . Chronic pain of right knee 06/26/2018  . Encounter for initial prescription of Nexplanon 08/21/2016  . S/P repeat low transverse C-section 06/27/2016  . 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 Previous cesarean delivery, antepartum condition or complication [id number]  . Language barrier, cultural differences 04/28/2016  . Advanced maternal age in multigravida    Past Medical History:  Diagnosis Date  . Acid  reflux   . Female circumcision   . Hemorrhoid   . 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 Hypercholesteremia     Family History  Problem Relation Age of Onset  . Diabetes Mother   . Diabetes Sister   . Diabetes Brother   . Hypertension Brother   . Diabetes Sister   . Diabetes Sister   . Autism Son   . Healthy Son   . Healthy Daughter   . Other Neg Hx     Past Surgical History:  Procedure Laterality Date  . CESAREAN SECTION    . CESAREAN SECTION N/A 01/02/2013   Procedure: CESAREAN SECTION;  Surgeon: Donnamae Jude, MD;  Location: [facility];  Service: Obstetrics;  Laterality: N/A;  Incidental Cyystotomy  . CESAREAN SECTION N/A 06/26/2016   Procedure: CESAREAN SECTION;  Surgeon: Aletha Halim, MD;  Location: [facility];  Service:  Obstetrics;  Laterality: N/A;  . CYSTOTOMY     with repair at 2014 repeat c-section   Social History   Occupational History  . Not on file  Tobacco Use  . Smoking status: Never Smoker  . Smokeless tobacco: Never Used  Substance and Sexual Activity  . Alcohol use: No  . Drug use: No  . Sexual activity: Yes    Birth control/protection: Implant

## 2019-12-23 NOTE — Addendum Note (Signed)
Addended by: Precious Bard on: 12/23/2019 02:29 PM   Modules accepted: Orders

## 2019-12-25 ENCOUNTER — Ambulatory Visit: Payer: Medicaid Other | Admitting: Physical Therapy

## 2020-01-01 ENCOUNTER — Encounter: Payer: Medicaid Other | Admitting: Physical Therapy

## 2020-01-08 ENCOUNTER — Encounter: Payer: Medicaid Other | Admitting: Physical Therapy

## 2020-01-10 NOTE — Progress Notes (Signed)
Office Visit Note  [name]             Date of Birth: [birth year]           MRN: [id number]             PCP: Gildardo Pounds, NP Referring: Gildardo Pounds, NP Visit Date: 01/12/2020 Occupation: _0 @  Subjective:  Right knee pain   History of Present Illness: Michaela Moran is a 39 y.o. female with history of the right knee joint recurrent effusion.  She states she has done really well since her last visit.  She is following Ramadan and has been off all his medications and has been fasting.  She states she has been exercising at home on a regular basis.  She has not had any joint discomfort since her last visit.  She denies any joint swelling.  Activities of Daily Living:  Patient reports morning stiffness for 2 hours.   Patient Reports nocturnal pain.  Difficulty dressing/grooming: Denies Difficulty climbing stairs: Denies Difficulty getting out of chair: Denies Difficulty using hands for taps, buttons, cutlery, and/or writing: Denies  Review of Systems  Constitutional: Positive for fatigue. Negative for night sweats, weight gain and weight loss.  HENT: Negative for mouth sores, trouble swallowing, trouble swallowing, mouth dryness and nose dryness.   Eyes: Negative for pain, redness, visual disturbance and dryness.  Respiratory: Negative for cough, shortness of breath and difficulty breathing.   Cardiovascular: Negative for chest pain, palpitations, hypertension, irregular heartbeat and swelling in legs/feet.  Gastrointestinal: Negative for blood in stool, constipation and diarrhea.  Endocrine: Negative for heat intolerance and increased urination.  Genitourinary: Negative for difficulty urinating and vaginal dryness.  Musculoskeletal: Positive for arthralgias, joint pain, joint swelling, morning stiffness and muscle tenderness. Negative for myalgias, muscle weakness and myalgias.  Skin: Negative for color change, rash, hair loss, skin tightness, ulcers and sensitivity  to sunlight.  Allergic/Immunologic: Negative for susceptible to infections.  Neurological: Negative for dizziness, numbness, memory loss, night sweats and weakness.  Hematological: Negative for bruising/bleeding tendency and swollen glands.  Psychiatric/Behavioral: Negative for depressed mood and sleep disturbance. The patient is not nervous/anxious.     PMFS History:  Patient Active Problem List   Diagnosis Date Noted  . Chronic pain of right knee 06/26/2018  . Encounter for initial prescription of Nexplanon 08/21/2016  . S/P repeat low transverse C-section 06/27/2016  . 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 Previous cesarean delivery, antepartum condition or complication 48/18/5631  . Language barrier, cultural differences 04/28/2016  . Advanced maternal age in multigravida     Past Medical History:  Diagnosis Date  . Acid reflux   . Female circumcision   . Hemorrhoid   . 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 Hypercholesteremia     Family History  Problem Relation Age of Onset  . Diabetes Mother   . Diabetes Sister   . Diabetes Brother   . Hypertension Brother   . Diabetes Sister   . Diabetes Sister   . Autism Son   . Healthy Son   . Healthy Daughter   . Other Neg Hx    Past Surgical History:  Procedure Laterality Date  . CESAREAN SECTION    . CESAREAN SECTION N/A 01/02/2013   Procedure: CESAREAN SECTION;  Surgeon: Donnamae Jude, MD;  Location: [facility];  Service: Obstetrics;  Laterality: N/A;  Incidental Cyystotomy  . CESAREAN SECTION  N/A 06/26/2016   Procedure: CESAREAN SECTION;  Surgeon: Aletha Halim, MD;  Location: [facility];  Service: Obstetrics;  Laterality: N/A;  . CYSTOTOMY     with repair at 2014 repeat c-section   Social History   Social History Narrative  . Not on file   Immunization History  Administered Date(s) Administered  . Influenza,inj,Quad PF,6+ Mos 06/28/2016      Objective: Vital Signs: BP 98/72 (BP Location: Left Arm, Patient Position: Sitting, Cuff Size: Normal)   Pulse 83   Resp 14   Ht 5' 6" (1.676 m)   Wt 169 lb 6.4 oz (76.8 kg)   BMI 27.34 kg/m    Physical Exam Vitals and nursing note reviewed.  Constitutional:      Appearance: She is well-developed.  HENT:     Head: Normocephalic and atraumatic.  Eyes:     Conjunctiva/sclera: Conjunctivae normal.  Cardiovascular:     Rate and Rhythm: Normal rate and regular rhythm.     Heart sounds: Normal heart sounds.  Pulmonary:     Effort: Pulmonary effort is normal.     Breath sounds: Normal breath sounds.  Abdominal:     General: Bowel sounds are normal.     Palpations: Abdomen is soft.  Musculoskeletal:     Cervical back: Normal range of motion.  Lymphadenopathy:     Cervical: No cervical adenopathy.  Skin:    General: Skin is warm and dry.     Capillary Refill: Capillary refill takes less than 2 seconds.  Neurological:     Mental Status: She is alert and oriented to person, place, and time.  Psychiatric:        Behavior: Behavior normal.      Musculoskeletal Exam: C-spine thoracic and lumbar spine were in good range of motion.  She had no SI joint tenderness.  Shoulder joints, elbow joints, wrist joints, MCPs PIPs and DIPs with good range of motion with no synovitis.  Hip joints, knee joints, ankles, MTPs and PIPs with good range of motion with no synovitis.  She had no evidence of Achilles tendinitis or plantar fasciitis.  CDAI Exam: CDAI Score: -- Patient Global: --; Provider Global: -- Swollen: --; Tender: -- Joint Exam 01/12/2020   No joint exam has been documented for this visit   There is currently no information documented on the homunculus. Go to the Rheumatology activity and complete the homunculus joint exam.  Investigation: No additional findings.  Imaging: No results found.  Recent Labs: Lab Results  Component Value Date   WBC 7.4 05/11/2019   HGB  13.6 05/11/2019   PLT 286 05/11/2019   NA 138 05/11/2019   K 3.8 05/11/2019   CL 99 05/11/2019   CO2 24 05/11/2019   GLUCOSE 99 05/11/2019   BUN 13 05/11/2019   CREATININE 0.74 05/11/2019   BILITOT 0.2 05/11/2019   ALKPHOS 73 05/11/2019   AST 11 05/11/2019   ALT 9 05/11/2019   PROT 7.0 05/11/2019   ALBUMIN 4.3 05/11/2019   CALCIUM 9.0 05/11/2019   GFRAA 118 05/11/2019  December 09, 2019 anti-CCP negative, _0 eta negative, HLA-B27 negative  06/18/19: ANA 1:40 cytoplasmic, RF<14, ESR 14, uric acid 4.4.  Speciality Comments: No specialty comments available.  Procedures:  No procedures performed Allergies: Other   Assessment / Plan:     Visit Diagnoses: Chronic pain of right knee - History of meniscal tear.  Followed by Dr. Erlinda Hong.  Patient is doing better now.  She has had  no recurrence of joint pain or inflammation.  Effusion, right knee - History of recurrent effusion.  Synovial fluid was not inflammatory.  All autoimmune work-up is negative.  I have advised her to contact me in case she has recurrence of symptoms.  Chronic SI joint pain - X-rays were unremarkable.  DDD (degenerative disc disease), lumbar-she has chronic pain.  Vitamin D deficiency  Pelvic adhesive disease  Language barrier, cultural differences  Orders: No orders of the defined types were placed in this encounter.  No orders of the defined types were placed in this encounter.     Follow-Up Instructions: Return in about 6 months (around 07/13/2020) for Osteoarthritis.   Bo Merino, MD  Note - This record has been created using Editor, commissioning.  Chart creation errors have been sought, but may not always  have been located. Such creation errors do not reflect on  the standard of medical care.

## 2020-01-12 ENCOUNTER — Other Ambulatory Visit: Payer: Self-pay

## 2020-01-12 ENCOUNTER — Ambulatory Visit (INDEPENDENT_AMBULATORY_CARE_PROVIDER_SITE_OTHER): Payer: Medicaid Other | Admitting: Rheumatology

## 2020-01-12 ENCOUNTER — Encounter: Payer: Self-pay | Admitting: Rheumatology

## 2020-01-12 VITALS — BP 98/72 | HR 83 | Resp 14 | Ht 66.0 in | Wt 169.4 lb

## 2020-01-12 DIAGNOSIS — M5136 Other intervertebral disc degeneration, lumbar region: Secondary | ICD-10-CM | POA: Diagnosis not present

## 2020-01-12 DIAGNOSIS — Z603 Acculturation difficulty: Secondary | ICD-10-CM

## 2020-01-12 DIAGNOSIS — E559 Vitamin D deficiency, unspecified: Secondary | ICD-10-CM | POA: Diagnosis not present

## 2020-01-12 DIAGNOSIS — M25561 Pain in right knee: Secondary | ICD-10-CM | POA: Diagnosis not present

## 2020-01-12 DIAGNOSIS — N736 Female pelvic peritoneal adhesions (postinfective): Secondary | ICD-10-CM | POA: Diagnosis not present

## 2020-01-12 DIAGNOSIS — G8929 Other chronic pain: Secondary | ICD-10-CM

## 2020-01-12 DIAGNOSIS — M533 Sacrococcygeal disorders, not elsewhere classified: Secondary | ICD-10-CM

## 2020-01-12 DIAGNOSIS — M25461 Effusion, right knee: Secondary | ICD-10-CM | POA: Diagnosis not present

## 2020-01-13 ENCOUNTER — Ambulatory Visit (HOSPITAL_COMMUNITY)
Admission: EM | Admit: 2020-01-13 | Discharge: 2020-01-13 | Disposition: A | Discharge status: Home / Self Care | Payer: Medicaid Other | Attending: Family Medicine | Admitting: Family Medicine

## 2020-01-13 ENCOUNTER — Other Ambulatory Visit: Payer: Self-pay

## 2020-01-13 ENCOUNTER — Encounter (HOSPITAL_COMMUNITY): Payer: Self-pay

## 2020-01-13 DIAGNOSIS — Z8249 Family history of ischemic heart disease and other diseases of the circulatory system: Secondary | ICD-10-CM | POA: Insufficient documentation

## 2020-01-13 DIAGNOSIS — T7840XA Allergy, unspecified, initial encounter: Secondary | ICD-10-CM | POA: Insufficient documentation

## 2020-01-13 DIAGNOSIS — X58XXXA Exposure to other specified factors, initial encounter: Secondary | ICD-10-CM | POA: Diagnosis not present

## 2020-01-13 DIAGNOSIS — Z833 Family history of diabetes mellitus: Secondary | ICD-10-CM | POA: Insufficient documentation

## 2020-01-13 DIAGNOSIS — Z20822 Contact with and (suspected) exposure to covid-19: Secondary | ICD-10-CM | POA: Diagnosis not present

## 2020-01-13 MED ORDER — FLUTICASONE PROPIONATE 50 MCG/ACT NA SUSP: 1.0000 | Freq: Every day | NASAL | 2 refills | Status: DC

## 2020-01-13 MED ORDER — CETIRIZINE HCL 10 MG PO TABS: 10.0000 mg | ORAL_TABLET | Freq: Every day | ORAL | 0 refills | Status: DC

## 2020-01-13 NOTE — ED Triage Notes (Signed)
Pt c/o HA, sneezing, head congestion, chest congestionx4 days. PT wants COVID test.

## 2020-01-13 NOTE — Discharge Instructions (Addendum)
Take the medication as prescribed for your symptoms.  You can also take tylenol for pain.  Work note given to rest We will call with any positive covid results.

## 2020-01-14 LAB — SARS CORONAVIRUS 2 (TAT 6-24 HRS): SARS Coronavirus 2: NEGATIVE

## 2020-01-14 NOTE — ED Provider Notes (Signed)
[facility]    CSN: [id number] Arrival date & time: 01/13/20  1327      History   Chief Complaint Chief Complaint  Patient presents with  . multiple symptoms    HPI Michaela Moran is a 39 y.o. female.   Patient is a 39 year old female with past medical history of acid reflux, hemorrhoid.  She presents today with cough, headache, sneezing, congestion x 4 days. Symptoms have been constant. Concerned for covid. Has been vaccinated. Has been taking tylenol for symptoms with some relief. No fever, chest pain, SOB.   ROS per HPI      Past Medical History:  Diagnosis Date  . Acid reflux   . Female circumcision   . Hemorrhoid   . 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 Hypercholesteremia     Patient Active Problem List   Diagnosis Date Noted  . Chronic pain of right knee 06/26/2018  . Encounter for initial prescription of Nexplanon 08/21/2016  . S/P repeat low transverse C-section 06/27/2016  . 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 Previous cesarean delivery, antepartum condition or complication [id number]  . Language barrier, cultural differences 04/28/2016  . Advanced maternal age in multigravida     Past Surgical History:  Procedure Laterality Date  . CESAREAN SECTION    . CESAREAN SECTION N/A 01/02/2013   Procedure: CESAREAN SECTION;  Surgeon: Donnamae Jude, MD;  Location: [facility];  Service: Obstetrics;  Laterality: N/A;  Incidental Cyystotomy  . CESAREAN SECTION N/A 06/26/2016   Procedure: CESAREAN SECTION;  Surgeon: Aletha Halim, MD;  Location: [facility];  Service: Obstetrics;  Laterality: N/A;  . CYSTOTOMY     with repair at 2014 repeat c-section    OB History    Gravida  3   Para  3   Term  3   Preterm      AB      Living  3     SAB      TAB      Ectopic      Multiple  0   Live Births  3            Home Medications    Prior to Admission medications     Medication Sig Start Date End Date Taking? Authorizing Provider  cetirizine (ZYRTEC) 10 MG tablet Take 1 tablet (10 mg total) by mouth daily. 01/13/20   Loura Halt A, NP  etonogestrel (NEXPLANON) 68 MG IMPL implant 1 each by Subdermal route once. Implanted October 2017    [provider]  fluticasone (FLONASE) 50 MCG/ACT nasal spray Place 1 spray into both nostrils daily. 01/13/20   Orvan July, NP    Family History Family History  Problem Relation Age of Onset  . Diabetes Mother   . Diabetes Sister   . Diabetes Brother   . Hypertension Brother   . Diabetes Sister   . Diabetes Sister   . Autism Son   . Healthy Son   . Healthy Daughter   . Other Neg Hx     Social History Social History   Tobacco Use  . Smoking status: Never Smoker  . Smokeless tobacco: Never Used  Substance Use Topics  . Alcohol use: No  . Drug use: No     Allergies   Other   Review of Systems Review of Systems   Physical Exam Triage Vital Signs ED Triage Vitals  Enc  Vitals Group     BP 01/13/20 1412 110/77     Pulse Rate 01/13/20 1412 77     Resp 01/13/20 1412 16     Temp 01/13/20 1412 98.8 F (37.1 C)     Temp Source 01/13/20 1412 Oral     SpO2 01/13/20 1412 99 %     Weight 01/13/20 1413 170 lb (77.1 kg)     Height 01/13/20 1413 5\' 6"  (1.676 m)     Head Circumference --      Peak Flow --      Pain Score 01/13/20 1412 5     Pain Loc --      Pain Edu? --      Excl. in [place]? --    No data found.  Updated Vital Signs BP 110/77   Pulse 77   Temp 98.8 F (37.1 C) (Oral)   Resp 16   Ht 5\' 6"  (1.676 m)   Wt 170 lb (77.1 kg)   SpO2 99%   BMI 27.44 kg/m   Visual Acuity Right Eye Distance:   Left Eye Distance:   Bilateral Distance:    Right Eye Near:   Left Eye Near:    Bilateral Near:     Physical Exam Vitals and nursing note reviewed.  Constitutional:      General: She is not in acute distress.    Appearance: Normal appearance. She is not ill-appearing,  toxic-appearing or diaphoretic.  HENT:     Head: Normocephalic.     Right Ear: Tympanic membrane normal.     Left Ear: Tympanic membrane normal.     Nose: Congestion present.     Mouth/Throat:     Pharynx: Oropharynx is clear.  Eyes:     Conjunctiva/sclera: Conjunctivae normal.  Cardiovascular:     Rate and Rhythm: Normal rate and regular rhythm.  Pulmonary:     Effort: Pulmonary effort is normal.     Breath sounds: Normal breath sounds.  Musculoskeletal:        General: Normal range of motion.     Cervical back: Normal range of motion.  Skin:    General: Skin is warm and dry.     Findings: No rash.  Neurological:     Mental Status: She is alert.  Psychiatric:        Mood and Affect: Mood normal.      UC Treatments / Results  Labs (all labs ordered are listed, but only abnormal results are displayed) Labs Reviewed  SARS CORONAVIRUS 2 (TAT 6-24 HRS)    EKG   Radiology No results found.  Procedures Procedures (including critical care time)  Medications Ordered in UC Medications - No data to display  Initial Impression / Assessment and Plan / UC Course  I have reviewed the triage vital signs and the nursing notes.  Pertinent labs & imaging results that were available during my care of the patient were reviewed by me and considered in my medical decision making (see chart for details).     Allergies Treating with Flonase and zyrtec covid test pending.  Final Clinical Impressions(s) / UC Diagnoses   Final diagnoses:  Allergy, initial encounter     Discharge Instructions     Take the medication as prescribed for your symptoms.  You can also take tylenol for pain.  Work note given to rest We will call with any positive covid results.     ED Prescriptions    Medication Sig Dispense Auth. Provider  fluticasone (FLONASE) 50 MCG/ACT nasal spray Place 1 spray into both nostrils daily. 16 g Alonah Lineback A, NP   cetirizine (ZYRTEC) 10 MG tablet Take 1  tablet (10 mg total) by mouth daily. 30 tablet Loura Halt A, NP     PDMP not reviewed this encounter.   Orvan July, NP 01/18/20 1734

## 2020-01-15 ENCOUNTER — Encounter: Payer: Medicaid Other | Admitting: Physical Therapy

## 2020-01-21 ENCOUNTER — Other Ambulatory Visit: Payer: Self-pay

## 2020-01-21 ENCOUNTER — Ambulatory Visit: Payer: Medicaid Other | Attending: Nurse Practitioner | Admitting: Physician Assistant

## 2020-01-21 VITALS — BP 108/77 | HR 76 | Temp 97.9°F | Ht 66.0 in | Wt 170.2 lb

## 2020-01-21 DIAGNOSIS — M549 Dorsalgia, unspecified: Secondary | ICD-10-CM

## 2020-01-21 DIAGNOSIS — Z603 Acculturation difficulty: Secondary | ICD-10-CM

## 2020-01-21 DIAGNOSIS — J069 Acute upper respiratory infection, unspecified: Secondary | ICD-10-CM | POA: Diagnosis not present

## 2020-01-21 DIAGNOSIS — Z09 Encounter for follow-up examination after completed treatment for conditions other than malignant neoplasm: Secondary | ICD-10-CM

## 2020-01-21 MED ORDER — AZITHROMYCIN 250 MG PO TABS: ORAL_TABLET | ORAL | 0 refills | Status: DC

## 2020-01-21 MED ORDER — NAPROXEN 500 MG PO TABS: 500.0000 mg | ORAL_TABLET | Freq: Two times a day (BID) | ORAL | 0 refills | Status: DC

## 2020-01-21 MED FILL — NAPROXEN 500 MG TABLET: 500 | 30 days supply | Qty: 60 | Fill #0

## 2020-01-21 MED FILL — AZITHROMYCIN 250 MG TABLET: 250 | 5 days supply | Qty: 6 | Fill #0

## 2020-01-21 NOTE — Progress Notes (Signed)
Patient ID: Bianey Vassey, female   DOB: [birth year], 39 y.o.   MRN: [id number]   Simiya Swisher, is a 39 y.o. female  [id number]  [id number]  DOB - 11/27/1980  Subjective:  Chief Complaint and HPI: Kimmi Weatherall is a 39 y.o. female here today for a follow up visit after Seen at ED 01/13/2020 for HA, body aches congestion, sneezing, and cough.  Was concerned about Covid but tested negative.  Still having symptoms now for about 1 week.  mucus is yellow and green.  L upper back with pain when she moves or lays certain ways.  No GI S/sx.  Asks to be OOO until after may 15.    "Dahir" with [employer] interpreters translating.  Marland Kitchen   ED/Hospital notes reviewed.   Social History: Family history:  ROS:   Constitutional:  No f/c, No night sweats, No unexplained weight loss. EENT:  No vision changes, No blurry vision, No hearing changes. No mouth, throat, or ear problems.  Respiratory: + cough, No SOB Cardiac: No CP, no palpitations GI:  No abd pain, No N/V/D. GU: No Urinary s/sx Musculoskeletal: No joint pain;  Upper L back pain and general aches and pains Neuro: No headache, no dizziness, no motor weakness.  Skin: No rash Endocrine:  No polydipsia. No polyuria.  Psych: Denies SI/HI  No problems updated.  ALLERGIES: Allergies  Allergen Reactions  . Other Swelling    Allergic to peas.    PAST MEDICAL HISTORY: Past Medical History:  Diagnosis Date  . Acid reflux   . Female circumcision   . Hemorrhoid   . 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 Hypercholesteremia     MEDICATIONS AT HOME: Prior to Admission medications   Medication Sig Start Date End Date Taking? Authorizing Provider  cetirizine (ZYRTEC) 10 MG tablet Take 1 tablet (10 mg total) by mouth daily. 01/13/20  Yes Bast, Traci A, NP  fluticasone (FLONASE) 50 MCG/ACT nasal spray Place 1 spray into both nostrils daily. 01/13/20  Yes Loura Halt A, NP  azithromycin (ZITHROMAX) 250 MG tablet Take 2 today then 1 daily 01/21/20   Argentina Donovan, PA-C  etonogestrel (NEXPLANON) 68 MG IMPL implant 1 each by Subdermal route once. Implanted October 2017    [provider]  naproxen (NAPROSYN) 500 MG tablet Take 1 tablet (500 mg total) by mouth 2 (two) times daily with a meal. Prn pain 01/21/20   Argentina Donovan, PA-C     Objective:  EXAM:   Vitals:   01/21/20 1441  BP: 108/77  Pulse: 76  Temp: 97.9 F (36.6 C)  TempSrc: Temporal  SpO2: 100%  Weight: 170 lb 3.2 oz (77.2 kg)  Height: 5\' 6"  (1.676 m)    General appearance : A&OX3. NAD. Non-toxic-appearing HEENT: Atraumatic and Normocephalic.  PERRLA. EOM intact.   Chest/Lungs:  Breathing-non-labored, Good air entry bilaterally, breath sounds normal without rales, rhonchi, or wheezing  CVS: S1 S2 regular, no murmurs, gallops, rubs  Trapezius spasm L upper back Extremities: Bilateral Lower Ext shows no edema, both legs are warm to touch with = pulse throughout Neurology:  CN II-XII grossly intact, Non focal.   Psych:  TP linear. J/I WNL. Normal speech. Appropriate eye contact and affect.  Skin:  No Rash  Data Review Lab Results  Component Value Date   HGBA1C 5.4 10/02/2013     Assessment & Plan   1. Encounter for examination following treatment at hospital No change  2. Language barrier,  cultural differences [employer] interpreters used and additional time performing visit was required.   3. Upper back pain on left side - naproxen (NAPROSYN) 500 MG tablet; Take 1 tablet (500 mg total) by mouth 2 (two) times daily with a meal. Prn pain  Dispense: 60 tablet; Refill: 0  4. Upper respiratory tract infection, unspecified type Will cover for atypicals and OOW thru Monday is ok - azithromycin (ZITHROMAX) 250 MG tablet; Take 2 today then 1 daily  Dispense: 6 tablet; Refill: 0     Patient have been counseled extensively about nutrition and exercise  Return in about 6 months (around 07/23/2020) for PCP;  sooner if needed.  The patient was given clear instructions  to go to ER or return to medical center if symptoms don't improve, worsen or new problems develop. The patient verbalized understanding. The patient was told to call to get lab results if they haven't heard anything in the next week.     Freeman Caldron, PA-C [facility], [place]   01/21/2020, 3:06 PM

## 2020-01-29 ENCOUNTER — Ambulatory Visit: Payer: Medicaid Other | Admitting: Physical Therapy

## 2020-03-04 ENCOUNTER — Ambulatory Visit: Payer: Medicaid Other | Admitting: Nurse Practitioner

## 2020-03-23 DIAGNOSIS — M545 Low back pain: Secondary | ICD-10-CM | POA: Diagnosis not present

## 2020-04-08 ENCOUNTER — Ambulatory Visit: Payer: Medicaid Other | Admitting: Nurse Practitioner

## 2020-04-10 ENCOUNTER — Other Ambulatory Visit: Payer: Self-pay

## 2020-04-10 ENCOUNTER — Encounter (HOSPITAL_COMMUNITY): Payer: Self-pay | Admitting: Emergency Medicine

## 2020-04-10 ENCOUNTER — Ambulatory Visit (HOSPITAL_COMMUNITY)
Admission: EM | Admit: 2020-04-10 | Discharge: 2020-04-10 | Disposition: A | Discharge status: Home / Self Care | Payer: Medicaid Other | Attending: Emergency Medicine | Admitting: Emergency Medicine

## 2020-04-10 DIAGNOSIS — L232 Allergic contact dermatitis due to cosmetics: Secondary | ICD-10-CM

## 2020-04-10 DIAGNOSIS — J3489 Other specified disorders of nose and nasal sinuses: Secondary | ICD-10-CM

## 2020-04-10 MED ORDER — TRIAMCINOLONE ACETONIDE 0.1 % EX CREA: 1.0000 "application " | TOPICAL_CREAM | Freq: Two times a day (BID) | CUTANEOUS | 0 refills | Status: DC

## 2020-04-10 MED ORDER — FLUTICASONE PROPIONATE 50 MCG/ACT NA SUSP: 1.0000 | Freq: Every day | NASAL | 2 refills | Status: DC

## 2020-04-10 MED ORDER — CEPHALEXIN 500 MG PO CAPS: 500.0000 mg | ORAL_CAPSULE | Freq: Three times a day (TID) | ORAL | 0 refills | Status: AC

## 2020-04-10 NOTE — ED Triage Notes (Signed)
April 04, 2020 had skin irritation and redness to both lateral ankles and left hand.  Area itches and red skin.    Patient had a religious holiday and this is what was the beginning of skin getting irritated.

## 2020-04-10 NOTE — Discharge Instructions (Signed)
I am worried about infection to your left ankle.  Complete course of antibiotics.  Topical cream twice a day to your hand and right ankle.  Limit your hand washing to prevent over drying of your hand.  Daily nasal spray for your sinus symptoms.  If symptoms worsen or do not improve in the next week to return to be seen or to follow up with your PCP.

## 2020-04-11 NOTE — ED Provider Notes (Signed)
Jane    CSN: [id number] Arrival date & time: 04/10/20  1040      History   Chief Complaint Chief Complaint  Patient presents with   Rash    HPI Michaela Moran is a 39 y.o. female.   Michaela Moran presents with complaints of itching rash to areas where she has henna tattoos for a recent holiday. 7/19 henna was placed to skin, noted itching 7/20, which has worsened to some areas. Primarily to left ankle which is also red. Rash is isolated to skin with henna. Has had henna before and never has had a reaction. Hasn't used and medications for treatments.  Also with complaints of persistent sinus pressure.  ROS per HPI, negative if not otherwise mentioned.      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   Previous cesarean delivery, antepartum condition or complication 49/17/9150   Language barrier, cultural differences 04/28/2016   Advanced maternal age in multigravida     Past Surgical History:  Procedure Laterality Date   CESAREAN SECTION     CESAREAN SECTION N/A 01/02/2013   Procedure: CESAREAN SECTION;  Surgeon: Donnamae Jude, MD;  Location: [facility];  Service: Obstetrics;  Laterality: N/A;  Incidental Cyystotomy   CESAREAN SECTION N/A 06/26/2016   Procedure: CESAREAN SECTION;  Surgeon: Aletha Halim, MD;  Location: [facility];  Service: Obstetrics;  Laterality: N/A;   CYSTOTOMY     with repair at 2014 repeat c-section    OB History    Gravida  3   Para  3   Term  3   Preterm      AB      Living  3     SAB      TAB       Ectopic      Multiple  0   Live Births  3            Home Medications    Prior to Admission medications   Medication Sig Start Date End Date Taking? Authorizing Provider  azithromycin (ZITHROMAX) 250 MG tablet Take 2 today then 1 daily 01/21/20   Argentina Donovan, PA-C  cephALEXin (KEFLEX) 500 MG capsule Take 1 capsule (500 mg total) by mouth 3 (three) times daily for 7 days. 04/10/20 04/17/20  Zigmund Gottron, NP  cetirizine (ZYRTEC) 10 MG tablet Take 1 tablet (10 mg total) by mouth daily. 01/13/20   Loura Halt A, NP  etonogestrel (NEXPLANON) 68 MG IMPL implant 1 each by Subdermal route once. Implanted October 2017    [provider]  fluticasone (FLONASE) 50 MCG/ACT nasal spray Place 1 spray into both nostrils daily. 04/10/20   Zigmund Gottron, NP  naproxen (NAPROSYN) 500 MG tablet Take 1 tablet (500 mg total) by mouth 2 (two) times daily with a meal. Prn pain 01/21/20   Argentina Donovan, PA-C  triamcinolone cream (KENALOG) 0.1 % Apply 1 application topically 2 (two) times daily. 04/10/20   Zigmund Gottron, NP    Family History  Family History  Problem Relation Age of Onset   Diabetes Mother    Diabetes Sister    Diabetes Brother    Hypertension Brother    Diabetes Sister    Diabetes Sister    Autism Son    Healthy Son    Healthy Daughter    Other Neg Hx     Social History Social History   Tobacco Use   Smoking status: Never Smoker   Smokeless tobacco: Never Used  Scientific laboratory technician Use: Never used  Substance Use Topics   Alcohol use: No   Drug use: No     Allergies   Other   Review of Systems Review of Systems   Physical Exam Triage Vital Signs ED Triage Vitals  Enc Vitals Group     BP 04/10/20 1204 (!) 116/86     Pulse Rate 04/10/20 1204 62     Resp 04/10/20 1204 18     Temp 04/10/20 1204 97.7 F (36.5 C)     Temp Source 04/10/20 1204 Oral     SpO2 04/10/20 1204 100 %     Weight --      Height --      Head  Circumference --      Peak Flow --      Pain Score 04/10/20 1201 0     Pain Loc --      Pain Edu? --      Excl. in [place]? --    No data found.  Updated Vital Signs BP (!) 116/86 (BP Location: Left Arm)    Pulse 62    Temp 97.7 F (36.5 C) (Oral)    Resp 18    LMP 04/04/2020    SpO2 100%   Visual Acuity Right Eye Distance:   Left Eye Distance:   Bilateral Distance:    Right Eye Near:   Left Eye Near:    Bilateral Near:     Physical Exam Constitutional:      General: She is not in acute distress.    Appearance: She is well-developed.  Cardiovascular:     Rate and Rhythm: Normal rate.  Pulmonary:     Effort: Pulmonary effort is normal.  Skin:    General: Skin is warm and dry.     Comments: Dry flaky skin with patchy lesions to left hand, dorsal aspect, no redness, or drainage; left lateral ankle is red with mild tenderness, two small blisters overlying the henna; redness around henna to right ankle as well   Neurological:     Mental Status: She is alert and oriented to person, place, and time.      UC Treatments / Results  Labs (all labs ordered are listed, but only abnormal results are displayed) Labs Reviewed - No data to display  EKG   Radiology No results found.  Procedures Procedures (including critical care time)  Medications Ordered in UC Medications - No data to display  Initial Impression / Assessment and Plan / UC Course  I have reviewed the triage vital signs and the nursing notes.  Pertinent labs & imaging results that were available during my care of the patient were reviewed by me and considered in my medical decision making (see chart for details).     Allergic response to henna on her skin. Concern for cellulitis to left ankle area which is open and blistering with surrounding redness. Return precautions provided. Patient verbalized understanding and agreeable to plan.   Final Clinical Impressions(s) /  UC Diagnoses   Final diagnoses:    Allergic contact dermatitis due to cosmetics  Sinus pressure     Discharge Instructions     I am worried about infection to your left ankle.  Complete course of antibiotics.  Topical cream twice a day to your hand and right ankle.  Limit your hand washing to prevent over drying of your hand.  Daily nasal spray for your sinus symptoms.  If symptoms worsen or do not improve in the next week to return to be seen or to follow up with your PCP.     ED Prescriptions    Medication Sig Dispense Auth. Provider   triamcinolone cream (KENALOG) 0.1 % Apply 1 application topically 2 (two) times daily. 30 g Augusto Gamble B, NP   cephALEXin (KEFLEX) 500 MG capsule Take 1 capsule (500 mg total) by mouth 3 (three) times daily for 7 days. 21 capsule Augusto Gamble B, NP   fluticasone (FLONASE) 50 MCG/ACT nasal spray Place 1 spray into both nostrils daily. 16 g Zigmund Gottron, NP     PDMP not reviewed this encounter.   Zigmund Gottron, NP 04/11/20 1020

## 2020-05-03 ENCOUNTER — Encounter: Payer: Self-pay | Admitting: Nurse Practitioner

## 2020-05-03 ENCOUNTER — Ambulatory Visit: Payer: Medicaid Other | Attending: Nurse Practitioner | Admitting: Nurse Practitioner

## 2020-05-03 ENCOUNTER — Other Ambulatory Visit: Payer: Self-pay

## 2020-05-03 VITALS — BP 120/79 | HR 76 | Temp 97.7°F | Wt 179.0 lb

## 2020-05-03 DIAGNOSIS — M5441 Lumbago with sciatica, right side: Secondary | ICD-10-CM

## 2020-05-03 DIAGNOSIS — E559 Vitamin D deficiency, unspecified: Secondary | ICD-10-CM | POA: Diagnosis not present

## 2020-05-03 DIAGNOSIS — L309 Dermatitis, unspecified: Secondary | ICD-10-CM | POA: Diagnosis not present

## 2020-05-03 DIAGNOSIS — Z0000 Encounter for general adult medical examination without abnormal findings: Secondary | ICD-10-CM

## 2020-05-03 DIAGNOSIS — Z1231 Encounter for screening mammogram for malignant neoplasm of breast: Secondary | ICD-10-CM

## 2020-05-03 DIAGNOSIS — Z131 Encounter for screening for diabetes mellitus: Secondary | ICD-10-CM | POA: Diagnosis not present

## 2020-05-03 DIAGNOSIS — M549 Dorsalgia, unspecified: Secondary | ICD-10-CM | POA: Diagnosis not present

## 2020-05-03 DIAGNOSIS — G8929 Other chronic pain: Secondary | ICD-10-CM

## 2020-05-03 DIAGNOSIS — E669 Obesity, unspecified: Secondary | ICD-10-CM

## 2020-05-03 MED ORDER — MOMETASONE FUROATE 0.1 % EX CREA: 1.0000 "application " | TOPICAL_CREAM | Freq: Every day | CUTANEOUS | 1 refills | Status: DC

## 2020-05-03 MED ORDER — NAPROXEN 500 MG PO TABS: 500.0000 mg | ORAL_TABLET | Freq: Two times a day (BID) | ORAL | 1 refills | Status: DC

## 2020-05-03 MED ORDER — TRAMADOL HCL 50 MG PO TABS: 50.0000 mg | ORAL_TABLET | Freq: Four times a day (QID) | ORAL | 0 refills | Status: AC | PRN

## 2020-05-03 NOTE — Progress Notes (Signed)
° °Assessment & Plan:  °Michaela Moran was seen today for annual exam. ° °Diagnoses and all orders for this visit: ° °Encounter for annual physical exam ° °Chronic bilateral low back pain with right-sided sciatica °-     traMADol (ULTRAM) 50 MG tablet; Take 1-2 tablets (50-100 mg total) by mouth every 6 (six) hours as needed. °-     naproxen (NAPROSYN) 500 MG tablet; Take 1 tablet (500 mg total) by mouth 2 (two) times daily with a meal. Prn pain °-     Ambulatory referral to Neurosurgery °Work on losing weight to help reduce back pain. May alternate with heat and ice application for pain relief. May also alternate with acetaminophen as prescribed for back pain. Other alternatives include massage, acupuncture and water aerobics.  You must stay active and avoid a sedentary lifestyle. ° ° ° °Breast cancer screening by mammogram °-     MM 3D SCREEN BREAST BILATERAL; Future ° °Encounter for screening for diabetes mellitus °-     Hemoglobin A1c ° °Obesity (BMI 30-39.9) °-     CMP14+EGFR °-     Lipid panel ° °Vitamin D deficiency disease °-     VITAMIN D 25 Hydroxy (Vit-D Deficiency, Fractures) ° °Eczema, unspecified type °-     mometasone (ELOCON) 0.1 % cream; Apply 1 application topically daily. ° ° ° °Patient has been counseled on age-appropriate routine health concerns for screening and prevention. These are reviewed and up-to-date. Referrals have been placed accordingly. Immunizations are up-to-date or declined.    °Subjective:  ° °Chief Complaint  °Patient presents with  °• Annual Exam  °  Pt. is here for a physical. Pt. stated she's been having a headache since she got the covid vaccine in June.   ° °HPI °Michaela Moran 38 y.o. female presents to office today for annual physical.  ° °She has chronic right knee pain with previous effusion from an injury in 2019.  States she feel on her knee a few years ago. in 2019. She also has a history of lumbar radiculopathy to the right leg. She has seen Dr. Xu for this and was prescribed  mobic and tramadol and given work accommodations.  °Today she states her back pain is persistent with pain radiating down her right hip into the lower right leg. She denies any symptoms of involuntary loss of urine or stool.  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 Correlate for radicular symptoms in the right L5 and right °L4 nerve root distributions. There is no canal stenosis of the °lumbar spine. ° °Review of Systems  °Constitutional: Negative for fever, malaise/fatigue and weight loss.  °     Decreased appetite  °HENT: Negative.  Negative for nosebleeds.   °Eyes: Negative.  Negative for blurred vision, double vision and photophobia.  °Respiratory: Negative.  Negative for cough and shortness of breath.   °Cardiovascular: Negative.  Negative for chest pain, palpitations and leg swelling.  °Gastrointestinal: Negative.  Negative for heartburn, nausea and vomiting.  °Musculoskeletal: Positive for back pain and joint pain. Negative for myalgias.  °Skin: Positive for itching and rash.  °Neurological: Positive for sensory change. Negative for dizziness, focal weakness, seizures and headaches.  °Psychiatric/Behavioral: Negative.  Negative for suicidal ideas.  ° ° °Past Medical History:  °Diagnosis Date  °• Acid reflux   °• Female circumcision   °• Hemorrhoid   °• History of positive PPD   °   neg CXR   Hypercholesteremia     Past Surgical History:  Procedure Laterality Date   CESAREAN SECTION     CESAREAN SECTION N/A 01/02/2013   Procedure: CESAREAN SECTION;  Surgeon: Donnamae Jude, MD;  Location: [facility];  Service: Obstetrics;  Laterality: N/A;  Incidental Cyystotomy   CESAREAN SECTION N/A 06/26/2016   Procedure: CESAREAN SECTION;  Surgeon: Aletha Halim, MD;  Location: [facility];  Service: Obstetrics;  Laterality: N/A;   CYSTOTOMY     with repair at 2014 repeat  c-section    Family History  Problem Relation Age of Onset   Diabetes Mother    Diabetes Sister    Diabetes Brother    Hypertension Brother    Diabetes Sister    Diabetes Sister    Autism Son    Healthy Son    Healthy Daughter    Other Neg Hx     Social History Reviewed with no changes to be made today.   Outpatient Medications Prior to Visit  Medication Sig Dispense Refill   cetirizine (ZYRTEC) 10 MG tablet Take 1 tablet (10 mg total) by mouth daily. 30 tablet 0   etonogestrel (NEXPLANON) 68 MG IMPL implant 1 each by Subdermal route once. Implanted October 2017     fluticasone (FLONASE) 50 MCG/ACT nasal spray Place 1 spray into both nostrils daily. 16 g 2   triamcinolone cream (KENALOG) 0.1 % Apply 1 application topically 2 (two) times daily. 30 g 0   azithromycin (ZITHROMAX) 250 MG tablet Take 2 today then 1 daily 6 tablet 0   naproxen (NAPROSYN) 500 MG tablet Take 1 tablet (500 mg total) by mouth 2 (two) times daily with a meal. Prn pain 60 tablet 0   No facility-administered medications prior to visit.    Allergies  Allergen Reactions   Other Swelling    Allergic to peas.       Objective:    BP 120/79 (BP Location: Left Arm, Patient Position: Sitting, Cuff Size: Normal)    Pulse 76    Temp 97.7 F (36.5 C) (Temporal)    Wt 179 lb (81.2 kg)    LMP 04/04/2020    SpO2 100%    BMI 28.89 kg/m  Wt Readings from Last 3 Encounters:  05/03/20 179 lb (81.2 kg)  01/21/20 170 lb 3.2 oz (77.2 kg)  01/13/20 170 lb (77.1 kg)    Physical Exam Constitutional:      Appearance: She is well-developed.  HENT:     Head: Normocephalic and atraumatic.     Right Ear: External ear normal.     Left Ear: External ear normal.     Nose: Nose normal.     Mouth/Throat:     Pharynx: No oropharyngeal exudate.  Eyes:     General: No scleral icterus.       Right eye: No discharge.     Conjunctiva/sclera: Conjunctivae normal.     Pupils: Pupils are equal, round, and  reactive to light.  Neck:     Thyroid: No thyromegaly.     Trachea: No tracheal deviation.  Cardiovascular:     Rate and Rhythm: Normal rate and regular rhythm.     Heart sounds: Normal heart sounds. No murmur heard.  No friction rub.  Pulmonary:     Effort: Pulmonary effort is normal. No accessory muscle usage or respiratory distress.     Breath sounds: Normal breath sounds. No decreased breath sounds, wheezing, rhonchi or rales.  Chest:  Chest wall: No tenderness.     Breasts: Breasts are symmetrical.        Right: No inverted nipple, mass, nipple discharge, skin change or tenderness.        Left: No inverted nipple, mass, nipple discharge, skin change or tenderness.  Abdominal:     General: Bowel sounds are normal. There is no distension.     Palpations: Abdomen is soft. There is no mass.     Tenderness: There is no abdominal tenderness. There is no guarding or rebound.  Musculoskeletal:        General: No tenderness or deformity.     Cervical back: Normal range of motion and neck supple.     Lumbar back: Positive right straight leg raise test.  Lymphadenopathy:     Cervical: No cervical adenopathy.  Skin:    General: Skin is warm and dry.     Findings: Rash (atopic dermatitis) present. No erythema.       Neurological:     Mental Status: She is alert and oriented to person, place, and time.     Cranial Nerves: No cranial nerve deficit.     Coordination: Coordination normal.     Deep Tendon Reflexes: Reflexes are normal and symmetric.  Psychiatric:        Speech: Speech normal.        Behavior: Behavior normal.        Thought Content: Thought content normal.        Judgment: Judgment normal.          Patient has been counseled extensively about nutrition and exercise as well as the importance of adherence with medications and regular follow-up. The patient was given clear instructions to go to ER or return to medical center if symptoms don't improve, worsen or new  problems develop. The patient verbalized understanding.   Follow-up: Return if symptoms worsen or fail to improve.   Gildardo Pounds, FNP-BC [facility] and [place], [place]   05/03/2020, 9:42 PM

## 2020-05-04 LAB — CMP14+EGFR
ALT: 14 IU/L (ref 0–32)
AST: 15 IU/L (ref 0–40)
Albumin/Globulin Ratio: 1.8 (ref 1.2–2.2)
Albumin: 4.2 g/dL (ref 3.8–4.8)
Alkaline Phosphatase: 78 IU/L (ref 48–121)
BUN/Creatinine Ratio: 12 (ref 9–23)
BUN: 8 mg/dL (ref 6–24)
Bilirubin Total: 0.3 mg/dL (ref 0.0–1.2)
CO2: 28 mmol/L (ref 20–29)
Calcium: 9 mg/dL (ref 8.7–10.2)
Chloride: 103 mmol/L (ref 96–106)
Creatinine, Ser: 0.68 mg/dL (ref 0.57–1.00)
GFR calc Af Amer: 127 mL/min/{1.73_m2} (ref >59)
GFR calc non Af Amer: 110 mL/min/{1.73_m2} (ref >59)
Globulin, Total: 2.4 g/dL (ref 1.5–4.5)
Glucose: 88 mg/dL (ref 65–99)
Potassium: 3.9 mmol/L (ref 3.5–5.2)
Sodium: 140 mmol/L (ref 134–144)
Total Protein: 6.6 g/dL (ref 6.0–8.5)

## 2020-05-04 LAB — LIPID PANEL
Chol/HDL Ratio: 4.2 ratio (ref 0.0–4.4)
Cholesterol, Total: 216 mg/dL — ABNORMAL HIGH (ref 100–199)
HDL: 51 mg/dL (ref >39)
LDL Chol Calc (NIH): 146 mg/dL — ABNORMAL HIGH (ref 0–99)
Triglycerides: 104 mg/dL (ref 0–149)
VLDL Cholesterol Cal: 19 mg/dL (ref 5–40)

## 2020-05-04 LAB — HEMOGLOBIN A1C
Est. average glucose Bld gHb Est-mCnc: 117 mg/dL
Hgb A1c MFr Bld: 5.7 % — ABNORMAL HIGH (ref 4.8–5.6)

## 2020-05-04 LAB — VITAMIN D 25 HYDROXY (VIT D DEFICIENCY, FRACTURES): Vit D, 25-Hydroxy: 14.3 ng/mL — ABNORMAL LOW (ref 30.0–100.0)

## 2020-05-09 ENCOUNTER — Ambulatory Visit: Payer: Medicaid Other | Admitting: Advanced Practice Midwife

## 2020-05-09 NOTE — Progress Notes (Deleted)
  GYNECOLOGY PROGRESS NOTE  History:  39 y.o. G3P3003 presents to [facility] office today for contraceptive visit for Nexplanon placement. She reports *****.  She denies h/a, dizziness, shortness of breath, n/v, or fever/chills.    The following portions of the patient's history were reviewed and updated as appropriate: allergies, current medications, past family history, past medical history, past social history, past surgical history and problem list. Last pap smear on *** was normal, *** HRHPV.  Review of Systems:  Pertinent items are noted in HPI.   Objective:  Physical Exam currently breastfeeding. VS reviewed, nursing note reviewed,  Constitutional: well developed, well nourished, no distress HEENT: normocephalic CV: normal rate Pulm/chest wall: normal effort Breast Exam: deferred Abdomen: soft Neuro: alert and oriented x 3 Skin: warm, dry Psych: affect normal Pelvic exam: Cervix pink, visually closed, without lesion, scant white creamy discharge, vaginal walls and external genitalia normal Bimanual exam: Cervix 0/long/high, firm, anterior, neg CMT, uterus nontender, nonenlarged, adnexa without tenderness, enlargement, or mass  Nexplanon Insertion Procedure Patient identified, informed consent performed, consent signed.   Patient does understand that irregular bleeding is a very common side effect of this medication. She was advised to have backup contraception for one week after placement. Pregnancy test in clinic today was negative.  Appropriate time out taken.  Patient's left arm was prepped and draped in the usual sterile fashion.. The ruler used to measure and mark insertion area.  Patient was prepped with alcohol swab and then injected with 3 ml of 1% lidocaine.  She was prepped with betadine, Nexplanon removed from packaging,  Device confirmed in needle, then inserted full length of needle and withdrawn per handbook instructions. Nexplanon was able to palpated in the patient's  arm; patient palpated the insert herself. There was minimal blood loss.  Patient insertion site covered with guaze and a pressure bandage to reduce any bruising.  The patient tolerated the procedure well and was given post procedure instructions.   Assessment & Plan:  1. Nexplanon insertion ***  2. Encounter for counseling regarding contraception ***  Fatima Blank, CNM 8:18 AM

## 2020-05-12 ENCOUNTER — Other Ambulatory Visit: Payer: Self-pay | Admitting: Nurse Practitioner

## 2020-05-12 MED ORDER — VITAMIN D (ERGOCALCIFEROL) 1.25 MG (50000 UNIT) PO CAPS: 50000.0000 [IU] | ORAL_CAPSULE | ORAL | 1 refills | Status: DC

## 2020-05-20 ENCOUNTER — Ambulatory Visit: Payer: Medicaid Other

## 2020-06-01 ENCOUNTER — Ambulatory Visit: Payer: Medicaid Other | Admitting: Advanced Practice Midwife

## 2020-06-02 ENCOUNTER — Ambulatory Visit: Payer: Medicaid Other

## 2020-06-09 ENCOUNTER — Other Ambulatory Visit: Payer: Self-pay

## 2020-06-09 ENCOUNTER — Encounter: Payer: Self-pay | Admitting: Family Medicine

## 2020-06-09 ENCOUNTER — Ambulatory Visit: Payer: Medicaid Other | Attending: Family Medicine | Admitting: Family Medicine

## 2020-06-09 VITALS — BP 105/71 | HR 79 | Ht 66.0 in | Wt 178.6 lb

## 2020-06-09 DIAGNOSIS — L309 Dermatitis, unspecified: Secondary | ICD-10-CM

## 2020-06-09 DIAGNOSIS — N3000 Acute cystitis without hematuria: Secondary | ICD-10-CM | POA: Diagnosis not present

## 2020-06-09 LAB — POCT URINALYSIS DIP (CLINITEK)
Bilirubin, UA: NEGATIVE
Glucose, UA: NEGATIVE mg/dL
Ketones, POC UA: NEGATIVE mg/dL
Leukocytes, UA: NEGATIVE
Nitrite, UA: NEGATIVE
POC PROTEIN,UA: NEGATIVE
Spec Grav, UA: 1.025 (ref 1.010–1.025)
Urobilinogen, UA: 0.2 E.U./dL
pH, UA: 6 (ref 5.0–8.0)

## 2020-06-09 MED ORDER — CEPHALEXIN 500 MG PO CAPS: 500.0000 mg | ORAL_CAPSULE | Freq: Two times a day (BID) | ORAL | 0 refills | Status: DC

## 2020-06-09 MED ORDER — CLOTRIMAZOLE-BETAMETHASONE 1-0.05 % EX CREA: 1.0000 "application " | TOPICAL_CREAM | Freq: Two times a day (BID) | CUTANEOUS | 0 refills | Status: DC

## 2020-06-09 MED FILL — CEPHALEXIN 500 MG CAPSULE: 500 | 7 days supply | Qty: 14 | Fill #0

## 2020-06-09 MED FILL — CLOTRIMAZOLE-BETAMETHASONE: 1-0.05 | 15 days supply | Qty: 30 | Fill #0

## 2020-06-09 NOTE — Progress Notes (Signed)
Subjective:  Patient ID: Michaela Moran, female    DOB: [birth year]  Age: 38 y.o. MRN: [id number]  CC: Urinary Tract Infection   HPI Michaela Moran is a 39 year old female patient of Zelda Fleming,FNP who presents today for an acute visit.  Last night she developed dysuria, urinary frequency, bilateral flank pain, lower abdominal pain. Denies presence of fever, nausea, vomiting and she has been drinking a lot of water. She has a left wrist rash for which she received clotrimazole/betamethasone cream with improvement in symptoms and is requesting a refill of the same cream.  Past Medical History:  Diagnosis Date  . Acid reflux   . Female circumcision   . Hemorrhoid   . 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 Hypercholesteremia     Past Surgical History:  Procedure Laterality Date  . CESAREAN SECTION    . CESAREAN SECTION N/A 01/02/2013   Procedure: CESAREAN SECTION;  Surgeon: Donnamae Jude, MD;  Location: [facility];  Service: Obstetrics;  Laterality: N/A;  Incidental Cyystotomy  . CESAREAN SECTION N/A 06/26/2016   Procedure: CESAREAN SECTION;  Surgeon: Aletha Halim, MD;  Location: [facility];  Service: Obstetrics;  Laterality: N/A;  . CYSTOTOMY     with repair at 2014 repeat c-section    Family History  Problem Relation Age of Onset  . Diabetes Mother   . Diabetes Sister   . Diabetes Brother   . Hypertension Brother   . Diabetes Sister   . Diabetes Sister   . Autism Son   . Healthy Son   . Healthy Daughter   . Other Neg Hx     Allergies  Allergen Reactions  . Other Swelling    Allergic to peas.    Outpatient Medications Prior to Visit  Medication Sig Dispense Refill  . cetirizine (ZYRTEC) 10 MG tablet Take 1 tablet (10 mg total) by mouth daily. 30 tablet 0  . etonogestrel (NEXPLANON) 68 MG IMPL implant 1 each by Subdermal route once. Implanted October 2017    . fluticasone (FLONASE) 50 MCG/ACT nasal spray Place 1 spray into both nostrils daily. 16 g 2  . mometasone  (ELOCON) 0.1 % cream Apply 1 application topically daily. 90 g 1  . naproxen (NAPROSYN) 500 MG tablet Take 1 tablet (500 mg total) by mouth 2 (two) times daily with a meal. Prn pain 60 tablet 1  . triamcinolone cream (KENALOG) 0.1 % Apply 1 application topically 2 (two) times daily. 30 g 0  . Vitamin D, Ergocalciferol, (DRISDOL) 1.25 MG (50000 UNIT) CAPS capsule Take 1 capsule (50,000 Units total) by mouth every 7 (seven) days. 12 capsule 1   No facility-administered medications prior to visit.     ROS Review of Systems  Constitutional: Negative for activity change, appetite change and fatigue.  HENT: Negative for congestion, sinus pressure and sore throat.   Eyes: Negative for visual disturbance.  Respiratory: Negative for cough, chest tightness, shortness of breath and wheezing.   Cardiovascular: Negative for chest pain and palpitations.  Gastrointestinal: Negative for abdominal distention, abdominal pain and constipation.  Endocrine: Negative for polydipsia.  Genitourinary: Negative for dysuria and frequency.  Musculoskeletal: Negative for arthralgias and back pain.  Skin: Positive for rash.  Neurological: Negative for tremors, light-headedness and numbness.  Hematological: Does not bruise/bleed easily.  Psychiatric/Behavioral: Negative for agitation and behavioral problems.    Objective:  BP 105/71   Pulse 79   Ht 5\' 6"  (1.676 m)   Wt 178 lb  9.6 oz (81 kg)   SpO2 98%   BMI 28.83 kg/m   BP/Weight 06/09/2020 05/03/2020 05/11/538  Systolic BP 767 341 937  Diastolic BP 71 79 86  Wt. (Lbs) 178.6 179 -  BMI 28.83 28.89 -      Physical Exam Constitutional:      Appearance: She is well-developed.  Neck:     Vascular: No JVD.  Cardiovascular:     Rate and Rhythm: Normal rate.     Heart sounds: Normal heart sounds. No murmur heard.   Pulmonary:     Effort: Pulmonary effort is normal.     Breath sounds: Normal breath sounds. No wheezing or rales.  Chest:     Chest  wall: No tenderness.  Abdominal:     General: Bowel sounds are normal. There is no distension.     Palpations: Abdomen is soft. There is no mass.     Tenderness: There is no abdominal tenderness. There is left CVA tenderness.  Musculoskeletal:        General: Normal range of motion.     Right lower leg: No edema.     Left lower leg: No edema.  Skin:    Comments: Pinpoint rash on dorsum of left wrist with associated scaling  Neurological:     Mental Status: She is alert and oriented to person, place, and time.  Psychiatric:        Mood and Affect: Mood normal.     CMP Latest Ref Rng & Units 05/03/2020 05/11/2019 12/14/2018  Glucose 65 - 99 mg/dL 88 99 91  BUN 6 - 24 mg/dL 8 13 15   Creatinine 0.57 - 1.00 mg/dL 0.68 0.74 0.77  Sodium 134 - 144 mmol/L 140 138 139  Potassium 3.5 - 5.2 mmol/L 3.9 3.8 3.7  Chloride 96 - 106 mmol/L 103 99 107  CO2 20 - 29 mmol/L 28 24 22   Calcium 8.7 - 10.2 mg/dL 9.0 9.0 9.2  Total Protein 6.0 - 8.5 g/dL 6.6 7.0 7.0  Total Bilirubin 0.0 - 1.2 mg/dL 0.3 0.2 0.8  Alkaline Phos 48 - 121 IU/L 78 73 72  AST 0 - 40 IU/L 15 11 19   ALT 0 - 32 IU/L 14 9 16     Lipid Panel     Component Value Date/Time   CHOL 216 (H) 05/03/2020 1137   TRIG 104 05/03/2020 1137   HDL 51 05/03/2020 1137   CHOLHDL 4.2 05/03/2020 1137   CHOLHDL 4.8 10/02/2013 1532   VLDL 27 10/02/2013 1532   LDLCALC 146 (H) 05/03/2020 1137    CBC    Component Value Date/Time   WBC 7.4 05/11/2019 1551   WBC 5.6 12/14/2018 1504   RBC 4.48 05/11/2019 1551   RBC 4.64 12/14/2018 1504   HGB 13.6 05/11/2019 1551   HCT 40.8 05/11/2019 1551   PLT 286 05/11/2019 1551   MCV 91 05/11/2019 1551   MCH 30.4 05/11/2019 1551   MCH 30.6 12/14/2018 1504   MCHC 33.3 05/11/2019 1551   MCHC 33.3 12/14/2018 1504   RDW 12.8 05/11/2019 1551   LYMPHSABS 2.2 12/14/2018 1504   LYMPHSABS 3.0 08/08/2018 1645   MONOABS 0.4 12/14/2018 1504   EOSABS 0.4 12/14/2018 1504   EOSABS 0.2 08/08/2018 1645   BASOSABS  0.1 12/14/2018 1504   BASOSABS 0.1 08/08/2018 1645    Lab Results  Component Value Date   HGBA1C 5.7 (H) 05/03/2020    Assessment & Plan:  1. Acute cystitis without hematuria UA negative but she  has the clinical symptoms hence I will treat - cephALEXin (KEFLEX) 500 MG capsule; Take 1 capsule (500 mg total) by mouth 2 (two) times daily.  Dispense: 14 capsule; Refill: 0 - POCT URINALYSIS DIP (CLINITEK)  2. Dermatitis - clotrimazole-betamethasone (LOTRISONE) cream; Apply 1 application topically 2 (two) times daily.  Dispense: 30 g; Refill: 0 Meds ordered this encounter  Medications  . clotrimazole-betamethasone (LOTRISONE) cream    Sig: Apply 1 application topically 2 (two) times daily.    Dispense:  30 g    Refill:  0  . cephALEXin (KEFLEX) 500 MG capsule    Sig: Take 1 capsule (500 mg total) by mouth 2 (two) times daily.    Dispense:  14 capsule    Refill:  0    Follow-up: Return for medical conditions, keep previously scheduled appointment with PCP.       Charlott Rakes, MD, FAAFP. [facility] and [place], [place]   06/09/2020, 12:13 PM

## 2020-06-09 NOTE — Progress Notes (Signed)
Pain in lower abdomen,frequent urination.

## 2020-06-09 NOTE — Patient Instructions (Signed)
Urinary Tract Infection, Adult A urinary tract infection (UTI) is an infection of any part of the urinary tract. The urinary tract includes:  The kidneys.  The ureters.  The bladder.  The urethra. These organs make, store, and get rid of pee (urine) in the body. What are the causes? This is caused by germs (bacteria) in your genital area. These germs grow and cause swelling (inflammation) of your urinary tract. What increases the risk? You are more likely to develop this condition if:  You have a small, thin tube (catheter) to drain pee.  You cannot control when you pee or poop (incontinence).  You are female, and: ? You use these methods to prevent pregnancy:  A medicine that kills sperm (spermicide).  A device that blocks sperm (diaphragm). ? You have low levels of a female hormone (estrogen). ? You are pregnant.  You have genes that add to your risk.  You are sexually active.  You take antibiotic medicines.  You have trouble peeing because of: ? A prostate that is bigger than normal, if you are female. ? A blockage in the part of your body that drains pee from the bladder (urethra). ? A kidney stone. ? A nerve condition that affects your bladder (neurogenic bladder). ? Not getting enough to drink. ? Not peeing often enough.  You have other conditions, such as: ? Diabetes. ? A weak disease-fighting system (immune system). ? Sickle cell disease. ? Gout. ? Injury of the spine. What are the signs or symptoms? Symptoms of this condition include:  Needing to pee right away (urgently).  Peeing often.  Peeing small amounts often.  Pain or burning when peeing.  Blood in the pee.  Pee that smells bad or not like normal.  Trouble peeing.  Pee that is cloudy.  Fluid coming from the vagina, if you are female.  Pain in the belly or lower back. Other symptoms include:  Throwing up (vomiting).  No urge to eat.  Feeling mixed up (confused).  Being tired  and grouchy (irritable).  A fever.  Watery poop (diarrhea). How is this treated? This condition may be treated with:  Antibiotic medicine.  Other medicines.  Drinking enough water. Follow these instructions at home:  Medicines  Take over-the-counter and prescription medicines only as told by your doctor.  If you were prescribed an antibiotic medicine, take it as told by your doctor. Do not stop taking it even if you start to feel better. General instructions  Make sure you: ? Pee until your bladder is empty. ? Do not hold pee for a long time. ? Empty your bladder after sex. ? Wipe from front to back after pooping if you are a female. Use each tissue one time when you wipe.  Drink enough fluid to keep your pee pale yellow.  Keep all follow-up visits as told by your doctor. This is important. Contact a doctor if:  You do not get better after 1-2 days.  Your symptoms go away and then come back. Get help right away if:  You have very bad back pain.  You have very bad pain in your lower belly.  You have a fever.  You are sick to your stomach (nauseous).  You are throwing up. Summary  A urinary tract infection (UTI) is an infection of any part of the urinary tract.  This condition is caused by germs in your genital area.  There are many risk factors for a UTI. These include having a small, thin   tube to drain pee and not being able to control when you pee or poop.  Treatment includes antibiotic medicines for germs.  Drink enough fluid to keep your pee pale yellow. This information is not intended to replace advice given to you by your health care provider. Make sure you discuss any questions you have with your health care provider. Document Revised: 08/21/2018 Document Reviewed: 03/13/2018 Elsevier Patient Education  2020 Elsevier Inc.  

## 2020-06-22 ENCOUNTER — Ambulatory Visit: Payer: Medicaid Other

## 2020-07-12 ENCOUNTER — Other Ambulatory Visit: Payer: Self-pay

## 2020-07-12 ENCOUNTER — Ambulatory Visit
Admission: RE | Admit: 2020-07-12 | Discharge: 2020-07-12 | Disposition: A | Discharge status: Home / Self Care | Payer: Medicaid Other | Source: Ambulatory Visit | Attending: Nurse Practitioner | Admitting: Nurse Practitioner

## 2020-07-12 DIAGNOSIS — Z1231 Encounter for screening mammogram for malignant neoplasm of breast: Secondary | ICD-10-CM | POA: Diagnosis not present

## 2020-07-21 ENCOUNTER — Emergency Department (HOSPITAL_COMMUNITY): Admission: EM | Admit: 2020-07-21 | Payer: Medicaid Other | Source: Home / Self Care

## 2020-07-21 ENCOUNTER — Other Ambulatory Visit: Payer: Self-pay

## 2020-07-22 ENCOUNTER — Other Ambulatory Visit: Payer: Self-pay

## 2020-07-22 ENCOUNTER — Encounter (HOSPITAL_COMMUNITY): Payer: Self-pay

## 2020-07-22 ENCOUNTER — Ambulatory Visit (HOSPITAL_COMMUNITY)
Admission: EM | Admit: 2020-07-22 | Discharge: 2020-07-22 | Disposition: A | Discharge status: Home / Self Care | Payer: Medicaid Other | Attending: Family Medicine | Admitting: Family Medicine

## 2020-07-22 DIAGNOSIS — J069 Acute upper respiratory infection, unspecified: Secondary | ICD-10-CM | POA: Diagnosis not present

## 2020-07-22 DIAGNOSIS — Z20822 Contact with and (suspected) exposure to covid-19: Secondary | ICD-10-CM | POA: Diagnosis not present

## 2020-07-22 MED ORDER — PROMETHAZINE-DM 6.25-15 MG/5ML PO SYRP: 5.0000 mL | ORAL_SOLUTION | Freq: Four times a day (QID) | ORAL | 0 refills | Status: DC | PRN

## 2020-07-22 MED ORDER — ALBUTEROL SULFATE HFA 108 (90 BASE) MCG/ACT IN AERS
1.0000 | INHALATION_SPRAY | Freq: Four times a day (QID) | RESPIRATORY_TRACT | 0 refills | Status: DC | PRN
Start: 2020-07-22 — End: 2020-08-18

## 2020-07-22 NOTE — ED Provider Notes (Signed)
[facility]    CSN: [id number] Arrival date & time: 07/22/20  1412      History   Chief Complaint Chief Complaint  Patient presents with  . Cough  . Headache  . Nasal Congestion    HPI Michaela Moran is a 39 y.o. female.   Patient presenting with 2 day history of cough, congestion, headache, body aches, fatigue. Denies known fever, sweats, abdominal pain, CP, SOB but has had some chest tightness. Taking tylenol OTC without much relief. Notes several family members also sick, awaiting COVID results and had a recent contact who tested positive for COVID. Denies hx of pulmonary dz or smoking.      Past Medical History:  Diagnosis Date  . Acid reflux   . Female circumcision   . Hemorrhoid   . 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 Hypercholesteremia     Patient Active Problem List   Diagnosis Date Noted  . Chronic pain of right knee 06/26/2018  . Encounter for initial prescription of Nexplanon 08/21/2016  . S/P repeat low transverse C-section 06/27/2016  . 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 Previous cesarean delivery, antepartum condition or complication 93/71/6967  . Language barrier, cultural differences 04/28/2016  . Advanced maternal age in multigravida     Past Surgical History:  Procedure Laterality Date  . CESAREAN SECTION    . CESAREAN SECTION N/A 01/02/2013   Procedure: CESAREAN SECTION;  Surgeon: Donnamae Jude, MD;  Location: [facility];  Service: Obstetrics;  Laterality: N/A;  Incidental Cyystotomy  . CESAREAN SECTION N/A 06/26/2016   Procedure: CESAREAN SECTION;  Surgeon: Aletha Halim, MD;  Location: [facility];  Service: Obstetrics;  Laterality: N/A;  . CYSTOTOMY     with repair at 2014 repeat c-section    OB History    Gravida  3   Para  3   Term  3   Preterm      AB      Living  3     SAB      TAB      Ectopic      Multiple  0   Live Births  3              Home Medications    Prior to Admission medications   Medication Sig Start Date End Date Taking? Authorizing Provider  albuterol (VENTOLIN HFA) 108 (90 Base) MCG/ACT inhaler Inhale 1-2 puffs into the lungs every 6 (six) hours as needed for wheezing or shortness of breath. 07/22/20   Volney American, PA-C  cephALEXin (KEFLEX) 500 MG capsule Take 1 capsule (500 mg total) by mouth 2 (two) times daily. 06/09/20   Charlott Rakes, MD  cetirizine (ZYRTEC) 10 MG tablet Take 1 tablet (10 mg total) by mouth daily. 01/13/20   Loura Halt A, NP  clotrimazole-betamethasone (LOTRISONE) cream Apply 1 application topically 2 (two) times daily. 06/09/20   Charlott Rakes, MD  etonogestrel (NEXPLANON) 68 MG IMPL implant 1 each by Subdermal route once. Implanted October 2017    [provider]  fluticasone (FLONASE) 50 MCG/ACT nasal spray Place 1 spray into both nostrils daily. 04/10/20   Zigmund Gottron, NP  mometasone (ELOCON) 0.1 % cream Apply 1 application topically daily. 05/03/20   Gildardo Pounds, NP  naproxen (NAPROSYN) 500 MG tablet Take 1 tablet (500 mg total) by mouth 2 (two) times daily with a meal. Prn pain 05/03/20  Gildardo Pounds, NP  promethazine-dextromethorphan (PROMETHAZINE-DM) 6.25-15 MG/5ML syrup Take 5 mLs by mouth 4 (four) times daily as needed for cough. 07/22/20   Volney American, PA-C  triamcinolone cream (KENALOG) 0.1 % Apply 1 application topically 2 (two) times daily. 04/10/20   Zigmund Gottron, NP  Vitamin D, Ergocalciferol, (DRISDOL) 1.25 MG (50000 UNIT) CAPS capsule Take 1 capsule (50,000 Units total) by mouth every 7 (seven) days. 05/12/20   Gildardo Pounds, NP    Family History Family History  Problem Relation Age of Onset  . Diabetes Mother   . Diabetes Sister   . Diabetes Brother   . Hypertension Brother   . Diabetes Sister   . Diabetes Sister   . Autism Son   . Healthy Son   . Healthy Daughter   . Other Neg Hx     Social History Social  History   Tobacco Use  . Smoking status: Never Smoker  . Smokeless tobacco: Never Used  Vaping Use  . Vaping Use: Never used  Substance Use Topics  . Alcohol use: No  . Drug use: No     Allergies   Other   Review of Systems Review of Systems PER HPI    Physical Exam Triage Vital Signs ED Triage Vitals  Enc Vitals Group     BP 07/22/20 1538 100/85     Pulse Rate 07/22/20 1538 75     Resp 07/22/20 1538 18     Temp 07/22/20 1538 98.7 F (37.1 C)     Temp Source 07/22/20 1538 Oral     SpO2 07/22/20 1538 99 %     Weight --      Height --      Head Circumference --      Peak Flow --      Pain Score 07/22/20 1536 4     Pain Loc --      Pain Edu? --      Excl. in [place]? --    No data found.  Updated Vital Signs BP 100/85 (BP Location: Left Arm)   Pulse 75   Temp 98.7 F (37.1 C) (Oral)   Resp 18   LMP 07/12/2020   SpO2 99%   Visual Acuity Right Eye Distance:   Left Eye Distance:   Bilateral Distance:    Right Eye Near:   Left Eye Near:    Bilateral Near:     Physical Exam Vitals and nursing note reviewed.  Constitutional:      Appearance: Normal appearance. She is not ill-appearing.  HENT:     Head: Atraumatic.     Right Ear: Tympanic membrane normal.     Left Ear: Tympanic membrane normal.     Nose: Rhinorrhea present.     Mouth/Throat:     Mouth: Mucous membranes are moist.     Pharynx: Posterior oropharyngeal erythema present.  Eyes:     Extraocular Movements: Extraocular movements intact.     Conjunctiva/sclera: Conjunctivae normal.  Cardiovascular:     Rate and Rhythm: Normal rate and regular rhythm.     Heart sounds: Normal heart sounds.  Pulmonary:     Effort: Pulmonary effort is normal.     Breath sounds: Normal breath sounds. No wheezing or rales.  Abdominal:     General: Bowel sounds are normal. There is no distension.     Palpations: Abdomen is soft.     Tenderness: There is no abdominal tenderness. There is no guarding.  Musculoskeletal:        General: Normal range of motion.     Cervical back: Normal range of motion and neck supple.  Skin:    General: Skin is warm and dry.  Neurological:     Mental Status: She is alert and oriented to person, place, and time.  Psychiatric:        Mood and Affect: Mood normal.        Thought Content: Thought content normal.        Judgment: Judgment normal.      UC Treatments / Results  Labs (all labs ordered are listed, but only abnormal results are displayed) Labs Reviewed  SARS CORONAVIRUS 2 (TAT 6-24 HRS)    EKG   Radiology No results found.  Procedures Procedures (including critical care time)  Medications Ordered in UC Medications - No data to display  Initial Impression / Assessment and Plan / UC Course  I have reviewed the triage vital signs and the nursing notes.  Pertinent labs & imaging results that were available during my care of the patient were reviewed by me and considered in my medical decision making (see chart for details).     Exam, vitals reassuring. COVID pcr pending, work note given for isolation. Albuterol, phenergan DM sent. Discussed OTC medications and supportive care. Return if sxs worsening or not resolving.   Final Clinical Impressions(s) / UC Diagnoses   Final diagnoses:  Viral URI with cough   Discharge Instructions   None    ED Prescriptions    Medication Sig Dispense Auth. Provider   albuterol (VENTOLIN HFA) 108 (90 Base) MCG/ACT inhaler Inhale 1-2 puffs into the lungs every 6 (six) hours as needed for wheezing or shortness of breath. [place], [place]   promethazine-dextromethorphan (PROMETHAZINE-DM) 6.25-15 MG/5ML syrup Take 5 mLs by mouth 4 (four) times daily as needed for cough. 100 mL Volney American, [place]     PDMP not reviewed this encounter.   Volney American, [place] 07/22/20 1600

## 2020-07-22 NOTE — ED Triage Notes (Signed)
Pt presents with headache, cough, body aches and nasal congestion x 1 day. States she woke up having scratchy throat today. Denies sob, fever.

## 2020-07-23 LAB — SARS CORONAVIRUS 2 (TAT 6-24 HRS): SARS Coronavirus 2: NEGATIVE

## 2020-08-16 ENCOUNTER — Emergency Department (HOSPITAL_COMMUNITY): Payer: Medicaid Other

## 2020-08-16 ENCOUNTER — Emergency Department (HOSPITAL_COMMUNITY)
Admission: EM | Admit: 2020-08-16 | Discharge: 2020-08-16 | Disposition: A | Discharge status: Home / Self Care | Payer: Medicaid Other | Attending: Emergency Medicine | Admitting: Emergency Medicine

## 2020-08-16 ENCOUNTER — Encounter (HOSPITAL_COMMUNITY): Payer: Self-pay | Admitting: Emergency Medicine

## 2020-08-16 ENCOUNTER — Ambulatory Visit: Payer: Self-pay

## 2020-08-16 DIAGNOSIS — R079 Chest pain, unspecified: Secondary | ICD-10-CM | POA: Diagnosis not present

## 2020-08-16 DIAGNOSIS — R0602 Shortness of breath: Secondary | ICD-10-CM | POA: Diagnosis not present

## 2020-08-16 DIAGNOSIS — R002 Palpitations: Secondary | ICD-10-CM | POA: Diagnosis not present

## 2020-08-16 DIAGNOSIS — R000 Tachycardia, unspecified: Secondary | ICD-10-CM | POA: Diagnosis not present

## 2020-08-16 DIAGNOSIS — R0789 Other chest pain: Secondary | ICD-10-CM | POA: Diagnosis not present

## 2020-08-16 LAB — BASIC METABOLIC PANEL
Anion gap: 10 (ref 5–15)
BUN: 10 mg/dL (ref 6–20)
CO2: 29 mmol/L (ref 22–32)
Calcium: 9.3 mg/dL (ref 8.9–10.3)
Chloride: 101 mmol/L (ref 98–111)
Creatinine, Ser: 0.84 mg/dL (ref 0.44–1.00)
GFR, Estimated: >60 mL/min (ref >60)
Glucose, Bld: 98 mg/dL (ref 70–99)
Potassium: 3.4 mmol/L — ABNORMAL LOW (ref 3.5–5.1)
Sodium: 140 mmol/L (ref 135–145)

## 2020-08-16 LAB — CBC
HCT: 41.6 % (ref 36.0–46.0)
Hemoglobin: 13.7 g/dL (ref 12.0–15.0)
MCH: 30.7 pg (ref 26.0–34.0)
MCHC: 32.9 g/dL (ref 30.0–36.0)
MCV: 93.3 fL (ref 80.0–100.0)
Platelets: 325 10*3/uL (ref 150–400)
RBC: 4.46 MIL/uL (ref 3.87–5.11)
RDW: 12.8 % (ref 11.5–15.5)
WBC: 6.4 10*3/uL (ref 4.0–10.5)
nRBC: 0 % (ref 0.0–0.2)

## 2020-08-16 LAB — I-STAT BETA HCG BLOOD, ED (MC, WL, AP ONLY): I-stat hCG, quantitative: <5 m[IU]/mL (ref <5)

## 2020-08-16 LAB — TROPONIN I (HIGH SENSITIVITY)
Troponin I (High Sensitivity): <2 ng/L (ref <18)
Troponin I (High Sensitivity): <2 ng/L (ref <18)

## 2020-08-16 MED ORDER — PANTOPRAZOLE SODIUM 20 MG PO TBEC: 20.0000 mg | DELAYED_RELEASE_TABLET | Freq: Every day | ORAL | 1 refills | Status: DC

## 2020-08-16 NOTE — ED Triage Notes (Signed)
Pt reports L sided chest pain and feeling like her heart is racing since yesterday. Some sob as well. Called her doctor who advised her to come to ED for further evaluation.

## 2020-08-16 NOTE — ED Provider Notes (Signed)
[facility] EMERGENCY DEPARTMENT Provider Note   CSN: [id number] Arrival date & time: 08/16/20  1446     History Chief Complaint  Patient presents with  . Chest Pain    Michaela Moran is a 39 y.o. female.  The history is provided by the patient and medical records. No language interpreter was used.  Chest Pain Pain location:  L chest and substernal area Pain quality comment:  Racing Pain radiates to:  Does not radiate Pain severity:  Mild Onset quality:  Sudden Timing:  Intermittent Progression:  Waxing and waning Chronicity:  New Context: not breathing, not intercourse, not raising an arm, not stress and not trauma   Relieved by:  Nothing Worsened by:  Nothing Ineffective treatments:  None tried Associated symptoms: cough, heartburn, palpitations and shortness of breath   Associated symptoms: no abdominal pain, no AICD problem, no altered mental status, no anorexia, no anxiety, no back pain, no claudication, no diaphoresis, no dizziness, no dysphagia, no fatigue, no fever, no headache, no lower extremity edema, no nausea, no near-syncope, no numbness, no orthopnea, no PND, no syncope, no vomiting and no weakness   Risk factors: obesity   Risk factors: no aortic disease, no birth control, no coronary artery disease, no diabetes mellitus, no Ehlers-Danlos syndrome, no high cholesterol, no hypertension, no immobilization, no Marfan's syndrome, not pregnant, no prior DVT/PE, no smoking and no surgery     HPI: A 39 year old patient with a history of obesity presents for evaluation of chest pain. Initial onset of pain was more than 6 hours ago. The patient's chest pain is not worse with exertion. The patient's chest pain is middle- or left-sided, is not well-localized, is not described as heaviness/pressure/tightness, is not sharp and does not radiate to the arms/jaw/neck. The patient does not complain of nausea and denies diaphoresis. The patient has no history of stroke,  has no history of peripheral artery disease, has not smoked in the past 90 days, denies any history of treated diabetes, has no relevant family history of coronary artery disease (first degree relative at less than age 37), is not hypertensive and has no history of hypercholesterolemia.   Past Medical History:  Diagnosis Date  . Acid reflux   . Female circumcision   . Hemorrhoid   . 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 Hypercholesteremia     Patient Active Problem List   Diagnosis Date Noted  . Chronic pain of right knee 06/26/2018  . Encounter for initial prescription of Nexplanon 08/21/2016  . S/P repeat low transverse C-section 06/27/2016  . 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 Previous cesarean delivery, antepartum condition or complication 50/93/2671  . Language barrier, cultural differences 04/28/2016  . Advanced maternal age in multigravida     Past Surgical History:  Procedure Laterality Date  . CESAREAN SECTION    . CESAREAN SECTION N/A 01/02/2013   Procedure: CESAREAN SECTION;  Surgeon: Donnamae Jude, MD;  Location: [facility];  Service: Obstetrics;  Laterality: N/A;  Incidental Cyystotomy  . CESAREAN SECTION N/A 06/26/2016   Procedure: CESAREAN SECTION;  Surgeon: Aletha Halim, MD;  Location: [facility];  Service: Obstetrics;  Laterality: N/A;  . CYSTOTOMY     with repair at 2014 repeat c-section     OB History    Gravida  3   Para  3   Term  3   Preterm      AB  Living  3     SAB      TAB      Ectopic      Multiple  0   Live Births  3           Family History  Problem Relation Age of Onset  . Diabetes Mother   . Diabetes Sister   . Diabetes Brother   . Hypertension Brother   . Diabetes Sister   . Diabetes Sister   . Autism Son   . Healthy Son   . Healthy Daughter   . Other Neg Hx     Social History   Tobacco Use  . Smoking status: Never Smoker  . Smokeless  tobacco: Never Used  Vaping Use  . Vaping Use: Never used  Substance Use Topics  . Alcohol use: No  . Drug use: No    Home Medications Prior to Admission medications   Medication Sig Start Date End Date Taking? Authorizing Provider  albuterol (VENTOLIN HFA) 108 (90 Base) MCG/ACT inhaler Inhale 1-2 puffs into the lungs every 6 (six) hours as needed for wheezing or shortness of breath. 07/22/20   Volney American, PA-C  cephALEXin (KEFLEX) 500 MG capsule Take 1 capsule (500 mg total) by mouth 2 (two) times daily. 06/09/20   Charlott Rakes, MD  cetirizine (ZYRTEC) 10 MG tablet Take 1 tablet (10 mg total) by mouth daily. 01/13/20   Loura Halt A, NP  clotrimazole-betamethasone (LOTRISONE) cream Apply 1 application topically 2 (two) times daily. 06/09/20   Charlott Rakes, MD  etonogestrel (NEXPLANON) 68 MG IMPL implant 1 each by Subdermal route once. Implanted October 2017    [provider]  fluticasone (FLONASE) 50 MCG/ACT nasal spray Place 1 spray into both nostrils daily. 04/10/20   Zigmund Gottron, NP  mometasone (ELOCON) 0.1 % cream Apply 1 application topically daily. 05/03/20   Gildardo Pounds, NP  naproxen (NAPROSYN) 500 MG tablet Take 1 tablet (500 mg total) by mouth 2 (two) times daily with a meal. Prn pain 05/03/20   Gildardo Pounds, NP  pantoprazole (PROTONIX) 20 MG tablet Take 1 tablet (20 mg total) by mouth daily. 08/16/20   Caralee Morea, Vernie Shanks, PA-C  promethazine-dextromethorphan (PROMETHAZINE-DM) 6.25-15 MG/5ML syrup Take 5 mLs by mouth 4 (four) times daily as needed for cough. 07/22/20   Volney American, PA-C  triamcinolone cream (KENALOG) 0.1 % Apply 1 application topically 2 (two) times daily. 04/10/20   Zigmund Gottron, NP  Vitamin D, Ergocalciferol, (DRISDOL) 1.25 MG (50000 UNIT) CAPS capsule Take 1 capsule (50,000 Units total) by mouth every 7 (seven) days. 05/12/20   Gildardo Pounds, NP    Allergies    Other  Review of Systems   Review of Systems    Constitutional: Negative for diaphoresis, fatigue and fever.  HENT: Negative for trouble swallowing.   Respiratory: Positive for cough and shortness of breath.   Cardiovascular: Positive for chest pain and palpitations. Negative for orthopnea, claudication, syncope, PND and near-syncope.  Gastrointestinal: Positive for heartburn. Negative for abdominal pain, anorexia, nausea and vomiting.  Musculoskeletal: Negative for back pain.  Neurological: Negative for dizziness, weakness, numbness and headaches.    Physical Exam Updated Vital Signs BP 102/74   Pulse 72   Temp 98 F (36.7 C)   Resp 18   SpO2 100%   Physical Exam Vitals and nursing note reviewed.  Constitutional:      General: She is not in acute distress.    Appearance: She  is well-developed. She is not diaphoretic.  HENT:     Head: Normocephalic and atraumatic.  Eyes:     General: No scleral icterus.    Conjunctiva/sclera: Conjunctivae normal.  Cardiovascular:     Rate and Rhythm: Normal rate and regular rhythm.     Heart sounds: Normal heart sounds. No murmur heard.  No friction rub. No gallop.   Pulmonary:     Effort: Pulmonary effort is normal. No respiratory distress.     Breath sounds: Normal breath sounds.  Abdominal:     General: Bowel sounds are normal. There is no distension.     Palpations: Abdomen is soft. There is no mass.     Tenderness: There is no abdominal tenderness. There is no guarding.  Musculoskeletal:     Cervical back: Normal range of motion.  Skin:    General: Skin is warm and dry.  Neurological:     Mental Status: She is alert and oriented to person, place, and time.  Psychiatric:        Behavior: Behavior normal.     ED Results / Procedures / Treatments   Labs (all labs ordered are listed, but only abnormal results are displayed) Labs Reviewed  BASIC METABOLIC PANEL - Abnormal; Notable for the following components:      Result Value   Potassium 3.4 (*)    All other components  within normal limits  CBC  I-STAT BETA HCG BLOOD, ED ([facility], [facility], [facility] ONLY)  TROPONIN I (HIGH SENSITIVITY)  TROPONIN I (HIGH SENSITIVITY)    EKG EKG Interpretation  Date/Time:  Tuesday August 16 2020 14:48:36 EST Ventricular Rate:  88 PR Interval:  140 QRS Duration: 86 QT Interval:  368 QTC Calculation: 445 R Axis:   60 Text Interpretation: Normal sinus rhythm with sinus arrhythmia T wave abnormality, consider inferior ischemia Abnormal ECG No significant change since last tracing Confirmed by Wandra Arthurs [id number]) on 08/16/2020 5:04:42 PM   Radiology DG Chest 2 View  Result Date: 08/16/2020 CLINICAL DATA:  Left-sided chest pain, tachycardia, short of breath EXAM: CHEST - 2 VIEW COMPARISON:  12/14/2018 FINDINGS: The heart size and mediastinal contours are within normal limits. Both lungs are clear. The visualized skeletal structures are unremarkable. IMPRESSION: No active cardiopulmonary disease. Electronically Signed   By: Randa Ngo M.D.   On: 08/16/2020 15:28    Procedures Procedures (including critical care time)  Medications Ordered in ED Medications - No data to display  ED Course  I have reviewed the triage vital signs and the nursing notes.  Pertinent labs & imaging results that were available during my care of the patient were reviewed by me and considered in my medical decision making (see chart for details).    MDM Rules/Calculators/A&P HEAR Score: 2                        Patient with intermittent episodes of racing heart. Given the large differential diagnosis for Alysia Kalas, the decision making in this case is of high complexity.  After evaluating all of the data points in this case, the presentation of Merilee Hausner is NOT consistent with Acute Coronary Syndrome (ACS) and/or myocardial ischemia, pulmonary embolism, aortic dissection; Borhaave's, significant arrythmia, pneumothorax, cardiac tamponade, or other emergent cardiopulmonary condition.  Further, the  presentation of Gunhild Orsak is NOT consistent with pericarditis, myocarditis, cholecystitis, pancreatitis, mediastinitis, endocarditis, new valvular disease.  Additionally, the presentation of Kaeley Noris NOT consistent with flail chest, cardiac contusion,  ARDS, or significant intra-thoracic or intra-abdominal bleeding.  Moreover, this presentation is NOT consistent with pneumonia, sepsis, or pyelonephritis.  The patient has had a   work up that includes  Cbc without abnormality, 2 view chest x-ray reviewed by me shows no acute abnormalities.  Troponin negative, BMP with mildly low hypokalemia at 3.4 of insignificant value, negative pregnancy test.  Strict return and follow-up precautions have been given by me personally or by detailed written instruction given verbally by nursing staff using the teach back method to the patient/family/caregiver(s).  Data Reviewed/Counseling: I have reviewed the patient's vital signs, nursing notes, and other relevant tests/information. I had a detailed discussion regarding the historical points, exam findings, and any diagnostic results supporting the discharge diagnosis. I also discussed the need for outpatient follow-up and the need to return to the ED if symptoms worsen or if there are any questions or concerns that arise at home.  Final Clinical Impression(s) / ED Diagnoses Final diagnoses:  Atypical chest pain  Racing heart beat    Rx / DC Orders ED Discharge Orders         Ordered    pantoprazole (PROTONIX) 20 MG tablet  Daily        08/16/20 1730           Margarita Mail, PA-C 08/16/20 1738    Drenda Freeze, MD 08/16/20 2225

## 2020-08-16 NOTE — Discharge Instructions (Signed)

## 2020-08-16 NOTE — Telephone Encounter (Signed)
Patient called and says she's been having pain for the last 3 days in the center of her chest with left arm muscle pain. She says the pain comes and goes, present now. She says the pain is a 7/10. She says at times she feels her heart beating fast, but not at this time. She denies any other symptoms. I advised to go to the ED. She asks if there are appointments today or tomorrow in the office with her provider or any provider. I advised no openings and with the chest pain she's having, the ED is the best option. She verbalized understanding.  Reason for Disposition . [1] Chest pain lasts > 5 minutes AND [2] occurred in past 3 days (72 hours) (Exception: feels exactly the same as previously diagnosed heartburn and has accompanying sour taste in mouth)  Answer Assessment - Initial Assessment Questions 1. LOCATION: "Where does it hurt?"       Center of the chest 2. RADIATION: "Does the pain go anywhere else?" (e.g., into neck, jaw, arms, back)      Left arm muscle pain 3. ONSET: "When did the chest pain begin?" (Minutes, hours or days)      Last 3 days 4. PATTERN "Does the pain come and go, or has it been constant since it started?"  "Does it get worse with exertion?"      Come and go 5. DURATION: "How long does it last" (e.g., seconds, minutes, hours)     1 minute 6. SEVERITY: "How bad is the pain?"  (e.g., Scale 1-10; mild, moderate, or severe)    - MILD (1-3): doesn't interfere with normal activities     - MODERATE (4-7): interferes with normal activities or awakens from sleep    - SEVERE (8-10): excruciating pain, unable to do any normal activities       7  7. CARDIAC RISK FACTORS: "Do you have any history of heart problems or risk factors for heart disease?" (e.g., angina, prior heart attack; diabetes, high blood pressure, high cholesterol, smoker, or strong family history of heart disease)     Yes, high cholesterol 8. PULMONARY RISK FACTORS: "Do you have any history of lung disease?"   (e.g., blood clots in lung, asthma, emphysema, birth control pills)     No 9. CAUSE: "What do you think is causing the chest pain?"     No 10. OTHER SYMPTOMS: "Do you have any other symptoms?" (e.g., dizziness, nausea, vomiting, sweating, fever, difficulty breathing, cough)     No 11. PREGNANCY: "Is there any chance you are pregnant?" "When was your last menstrual period?"      No  Protocols used: CHEST PAIN-A-AH

## 2020-08-17 ENCOUNTER — Telehealth: Payer: Self-pay

## 2020-08-17 NOTE — Telephone Encounter (Signed)
Transition Care Management Follow-up Telephone Call  Date of discharge and from where: [facility]  How have you been since you were released from the hospital? Doing better  Any questions or concerns? No  Items Reviewed:  Did the pt receive and understand the discharge instructions provided? Yes   Medications obtained and verified? Yes   Other? No   Any new allergies since your discharge? No   Dietary orders reviewed? Yes  Do you have support at home? Yes   Home Care and Equipment/Supplies: Were home health services ordered? not applicable If so, what is the name of the agency? na  Has the agency set up a time to come to the patient's home? not applicable Were any new equipment or medical supplies ordered?  No What is the name of the medical supply agency? na Were you able to get the supplies/equipment? no Do you have any questions related to the use of the equipment or supplies? No  Functional Questionnaire: (I = Independent and D = Dependent) ADLs: I  Bathing/Dressing- I  Meal Prep- I  Eating- I  Maintaining continence- I  Transferring/Ambulation- I  Managing Meds- I  Follow up appointments reviewed:   PCP Hospital f/u appt confirmed? Yes  Scheduled to see Sharyn Blitz on 08/30/2020 @ 10:50am.  [facility] f/u appt confirmed? No    Are transportation arrangements needed? No   If their condition worsens, is the pt aware to call PCP or go to the Emergency Dept.? Yes  Was the patient provided with contact information for the PCP's office or ED? Yes  Was to pt encouraged to call back with questions or concerns? Yes

## 2020-08-17 NOTE — Telephone Encounter (Signed)
Transition Care Management Unsuccessful Follow-up Telephone Call  Date of discharge and from where:  08/16/2020 [facility]  Attempts:  1st Attempt  Reason for unsuccessful TCM follow-up call:  Left voice message

## 2020-08-18 ENCOUNTER — Other Ambulatory Visit: Payer: Self-pay | Admitting: Family Medicine

## 2020-08-18 NOTE — Telephone Encounter (Signed)
Noted. Agree with advice given with Advice given to go to the ED

## 2020-08-18 NOTE — Telephone Encounter (Signed)
   Notes to clinic Was seen in ED and this was ordered, unsure if is to be continued.

## 2020-08-30 ENCOUNTER — Ambulatory Visit: Payer: Medicaid Other | Attending: Nurse Practitioner | Admitting: Nurse Practitioner

## 2020-08-30 ENCOUNTER — Other Ambulatory Visit: Payer: Self-pay

## 2020-08-30 ENCOUNTER — Encounter: Payer: Self-pay | Admitting: Nurse Practitioner

## 2020-08-30 VITALS — BP 111/74 | HR 84 | Temp 99.0°F | Ht 66.0 in | Wt 172.0 lb

## 2020-08-30 DIAGNOSIS — E559 Vitamin D deficiency, unspecified: Secondary | ICD-10-CM | POA: Diagnosis not present

## 2020-08-30 DIAGNOSIS — Z23 Encounter for immunization: Secondary | ICD-10-CM

## 2020-08-30 DIAGNOSIS — E876 Hypokalemia: Secondary | ICD-10-CM | POA: Diagnosis not present

## 2020-08-30 DIAGNOSIS — R300 Dysuria: Secondary | ICD-10-CM | POA: Diagnosis not present

## 2020-08-30 DIAGNOSIS — G629 Polyneuropathy, unspecified: Secondary | ICD-10-CM

## 2020-08-30 DIAGNOSIS — R7303 Prediabetes: Secondary | ICD-10-CM

## 2020-08-30 DIAGNOSIS — R0981 Nasal congestion: Secondary | ICD-10-CM | POA: Diagnosis not present

## 2020-08-30 LAB — POCT URINALYSIS DIP (CLINITEK)
Bilirubin, UA: NEGATIVE
Glucose, UA: NEGATIVE mg/dL
Ketones, POC UA: NEGATIVE mg/dL
Leukocytes, UA: NEGATIVE
Nitrite, UA: NEGATIVE
POC PROTEIN,UA: NEGATIVE
Spec Grav, UA: 1.015 (ref 1.010–1.025)
Urobilinogen, UA: 0.2 E.U./dL
pH, UA: 5.5 (ref 5.0–8.0)

## 2020-08-30 LAB — GLUCOSE, POCT (MANUAL RESULT ENTRY): POC Glucose: 101 mg/dl — AB (ref 70–99)

## 2020-08-30 MED ORDER — GABAPENTIN 300 MG PO CAPS: 300.0000 mg | ORAL_CAPSULE | Freq: Three times a day (TID) | ORAL | 3 refills | Status: DC

## 2020-08-30 MED ORDER — VITAMIN D (ERGOCALCIFEROL) 1.25 MG (50000 UNIT) PO CAPS: 50000.0000 [IU] | ORAL_CAPSULE | ORAL | 1 refills | Status: DC

## 2020-08-30 MED ORDER — FLUTICASONE PROPIONATE 50 MCG/ACT NA SUSP: 1.0000 | Freq: Every day | NASAL | 2 refills | Status: DC

## 2020-08-30 NOTE — Patient Instructions (Signed)
Centrum Multi Vitamin or B Complex

## 2020-08-30 NOTE — Progress Notes (Signed)
Assessment & Plan:  [name] was seen today for follow-up.  Diagnoses and all orders for this visit:  Prediabetes -     Glucose (CBG)  Dysuria -     POCT URINALYSIS DIP (CLINITEK) -     Urine Culture  Vitamin D deficiency disease -     VITAMIN D 25 Hydroxy (Vit-D Deficiency, Fractures) -     Vitamin D, Ergocalciferol, (DRISDOL) 1.25 MG (50000 UNIT) CAPS capsule; Take 1 capsule (50,000 Units total) by mouth every 7 (seven) days.  Hypokalemia -     Potassium  Need for immunization against influenza -     Flu Vaccine QUAD 36+ mos IM  Neuropathy -     gabapentin (NEURONTIN) 300 MG capsule; Take 1 capsule (300 mg total) by mouth 3 (three) times daily.  Nasal congestion -     fluticasone (FLONASE) 50 MCG/ACT nasal spray; Place 1 spray into both nostrils daily.   Patient has been counseled on age-appropriate routine health concerns for screening and prevention. These are reviewed and up-to-date. Referrals have been placed accordingly. Immunizations are up-to-date or declined.    Subjective:   Chief Complaint  Patient presents with   Follow-up    Pt. Is here for emergency room F/U. Pt. Stated she's been having headache and been feeling tired.    HPI [facility] 39 y.o. female presents to office today for HFU  has a past medical history of Acid reflux, Female circumcision, Hemorrhoid, History of positive PPD, and Hypercholesteremia.  VRI was used to communicate directly with patient for the entire encounter including providing detailed patient instructions.    She was seen in the ED on 08-16-2020 with atypical chest pain and "heart racing". Cardiac work up which consisted of CBC, 2 view chest x-ray, Troponin, BMP did not reveal any acute abnormalities aside from K+ 3.4. She was discharged with PPI.   Today she endorses headache and fatigue.  GU symptoms Has complaints of Dysuria and lower pelvic pain. Denies bilateral flank pain, constipation or hematuria.   Neuropathy She  states she needs to see a "nerve doctor". Describes a "nerve pain" all over her body that radiates from her right foot, right leg, into her right hand,  arm, shoulder and and right side of head.   Prediabetes Well controlled with diet only at this time.  Lab Results  Component Value Date   HGBA1C 5.7 (H) 05/03/2020   . Review of Systems  Constitutional: Negative for fever, malaise/fatigue and weight loss.  HENT: Positive for congestion. Negative for nosebleeds.   Eyes: Negative.  Negative for blurred vision, double vision and photophobia.  Respiratory: Negative.  Negative for cough and shortness of breath.   Cardiovascular: Negative.  Negative for chest pain, palpitations and leg swelling.  Gastrointestinal: Positive for heartburn. Negative for nausea and vomiting.  Genitourinary: Positive for dysuria. Negative for flank pain, frequency, hematuria and urgency.  Musculoskeletal: Negative.  Negative for myalgias.  Neurological: Positive for sensory change. Negative for dizziness, focal weakness, seizures and headaches.  Psychiatric/Behavioral: Negative.  Negative for suicidal ideas.    Past Medical History:  Diagnosis Date   Acid reflux    Female circumcision    Hemorrhoid    History of positive PPD    neg CXR   Hypercholesteremia     Past Surgical History:  Procedure Laterality Date   CESAREAN SECTION     CESAREAN SECTION N/A 01/02/2013   Procedure: CESAREAN SECTION;  Surgeon: Donnamae Jude, MD;  Location: [facility]  [facility];  Service: Obstetrics;  Laterality: N/A;  Incidental Cyystotomy   CESAREAN SECTION N/A 06/26/2016   Procedure: CESAREAN SECTION;  Surgeon: Aletha Halim, MD;  Location: [place];  Service: Obstetrics;  Laterality: N/A;   CYSTOTOMY     with repair at 2014 repeat c-section    Family History  Problem Relation Age of Onset   Diabetes Mother    Diabetes Sister    Diabetes Brother    Hypertension Brother    Diabetes Sister    Diabetes  Sister    Autism Son    Healthy Son    Healthy Daughter    Other Neg Hx     Social History Reviewed with no changes to be made today.   Outpatient Medications Prior to Visit  Medication Sig Dispense Refill   cetirizine (ZYRTEC) 10 MG tablet Take 1 tablet (10 mg total) by mouth daily. 30 tablet 0   clotrimazole-betamethasone (LOTRISONE) cream Apply 1 application topically 2 (two) times daily. 30 g 0   etonogestrel (NEXPLANON) 68 MG IMPL implant 1 each by Subdermal route once. Implanted October 2017     mometasone (ELOCON) 0.1 % cream Apply 1 application topically daily. 90 g 1   pantoprazole (PROTONIX) 20 MG tablet Take 1 tablet (20 mg total) by mouth daily. 30 tablet 1   PROAIR HFA 108 (90 Base) MCG/ACT inhaler INHALE 1 TO 2 PUFFS INTO THE LUNGS EVERY 6 HOURS AS NEEDED FOR WHEEZING OR SHORTNESS OF BREATH 8.5 g 0   promethazine-dextromethorphan (PROMETHAZINE-DM) 6.25-15 MG/5ML syrup Take 5 mLs by mouth 4 (four) times daily as needed for cough. 100 mL 0   cephALEXin (KEFLEX) 500 MG capsule Take 1 capsule (500 mg total) by mouth 2 (two) times daily. 14 capsule 0   fluticasone (FLONASE) 50 MCG/ACT nasal spray Place 1 spray into both nostrils daily. 16 g 2   naproxen (NAPROSYN) 500 MG tablet Take 1 tablet (500 mg total) by mouth 2 (two) times daily with a meal. Prn pain 60 tablet 1   triamcinolone cream (KENALOG) 0.1 % Apply 1 application topically 2 (two) times daily. 30 g 0   Vitamin D, Ergocalciferol, (DRISDOL) 1.25 MG (50000 UNIT) CAPS capsule Take 1 capsule (50,000 Units total) by mouth every 7 (seven) days. 12 capsule 1   No facility-administered medications prior to visit.    Allergies  Allergen Reactions   Other Swelling    Allergic to peas.       Objective:    BP 111/74 (BP Location: Right Arm, Patient Position: Sitting, Cuff Size: Normal)    Pulse 84    Temp 99 F (37.2 C) (Temporal)    Ht 5\' 6"  (1.676 m)    Wt 172 lb (78 kg)    SpO2 100%    BMI 27.76 kg/m   Wt Readings from Last 3 Encounters:  08/30/20 172 lb (78 kg)  06/09/20 178 lb 9.6 oz (81 kg)  05/03/20 179 lb (81.2 kg)    Physical Exam Vitals and nursing note reviewed.  Constitutional:      Appearance: She is well-developed and well-nourished.  HENT:     Head: Normocephalic and atraumatic.  Eyes:     Extraocular Movements: EOM normal.  Cardiovascular:     Rate and Rhythm: Normal rate and regular rhythm.     Pulses: Intact distal pulses.     Heart sounds: Normal heart sounds. No murmur heard. No friction rub. No gallop.   Pulmonary:     Effort: Pulmonary effort  is normal. No tachypnea or respiratory distress.     Breath sounds: Normal breath sounds. No decreased breath sounds, wheezing, rhonchi or rales.  Chest:     Chest wall: No tenderness.  Abdominal:     General: Bowel sounds are normal.     Palpations: Abdomen is soft.  Musculoskeletal:        General: No edema. Normal range of motion.     Cervical back: Normal range of motion.  Skin:    General: Skin is warm and dry.  Neurological:     Mental Status: She is alert and oriented to person, place, and time.     Coordination: Coordination normal.  Psychiatric:        Mood and Affect: Mood and affect normal.        Behavior: Behavior normal. Behavior is cooperative.        Thought Content: Thought content normal.        Judgment: Judgment normal.          Patient has been counseled extensively about nutrition and exercise as well as the importance of adherence with medications and regular follow-up. The patient was given clear instructions to go to ER or return to medical center if symptoms don't improve, worsen or new problems develop. The patient verbalized understanding.   Follow-up: Return in about 4 weeks (around 09/27/2020) for neuropathy.   Gildardo Pounds, FNP-BC [facility] [place], [place]   09/11/2020, 8:28 PM

## 2020-08-31 ENCOUNTER — Other Ambulatory Visit: Payer: Self-pay | Admitting: Nurse Practitioner

## 2020-08-31 LAB — POTASSIUM: Potassium: 3.7 mmol/L (ref 3.5–5.2)

## 2020-08-31 LAB — VITAMIN D 25 HYDROXY (VIT D DEFICIENCY, FRACTURES): Vit D, 25-Hydroxy: 17.8 ng/mL — ABNORMAL LOW (ref 30.0–100.0)

## 2020-09-01 LAB — URINE CULTURE

## 2020-09-11 ENCOUNTER — Encounter: Payer: Self-pay | Admitting: Nurse Practitioner

## 2020-09-18 ENCOUNTER — Encounter (HOSPITAL_COMMUNITY): Payer: Self-pay | Admitting: Emergency Medicine

## 2020-09-18 ENCOUNTER — Other Ambulatory Visit: Payer: Self-pay

## 2020-09-18 ENCOUNTER — Ambulatory Visit (HOSPITAL_COMMUNITY)
Admission: EM | Admit: 2020-09-18 | Discharge: 2020-09-18 | Disposition: A | Discharge status: Home / Self Care | Payer: Medicaid Other | Attending: Family Medicine | Admitting: Family Medicine

## 2020-09-18 DIAGNOSIS — U071 COVID-19: Secondary | ICD-10-CM | POA: Diagnosis not present

## 2020-09-18 DIAGNOSIS — J069 Acute upper respiratory infection, unspecified: Secondary | ICD-10-CM

## 2020-09-18 LAB — RESP PANEL BY RT-PCR (FLU A&B, COVID) ARPGX2
Influenza A by PCR: NEGATIVE
Influenza B by PCR: NEGATIVE
SARS Coronavirus 2 by RT PCR: POSITIVE — AB

## 2020-09-18 NOTE — ED Triage Notes (Signed)
PT C/O: cold sx onset 2 days associated w/cough, body aches, headache, fevers  DENIES: v/d  A&O x4... NAD... Ambulatory

## 2020-09-18 NOTE — ED Provider Notes (Signed)
[facility]    CSN: [id number] Arrival date & time: 09/18/20  1139      History   Chief Complaint Chief Complaint  Patient presents with  . URI    HPI Maleea Sherfield is a 40 y.o. female.   Here today with 2 day history of cough, body aches, headaches, fever, fatigue. Denies CP, SOB, N/V/D, sore throat, rashes. So far taking tylenol with mild temporary relief. Daughter sick as well with similar sxs. Denies known sick contacts, pertinent pmhx.      Past Medical History:  Diagnosis Date  . Acid reflux   . Female circumcision   . Hemorrhoid   . 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 Hypercholesteremia     Patient Active Problem List   Diagnosis Date Noted  . Chronic pain of right knee 06/26/2018  . Encounter for initial prescription of Nexplanon 08/21/2016  . S/P repeat low transverse C-section 06/27/2016  . 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 Previous cesarean delivery, antepartum condition or complication [id number]  . Language barrier, cultural differences 04/28/2016  . Advanced maternal age in multigravida     Past Surgical History:  Procedure Laterality Date  . CESAREAN SECTION    . CESAREAN SECTION N/A 01/02/2013   Procedure: CESAREAN SECTION;  Surgeon: Donnamae Jude, MD;  Location: [facility];  Service: Obstetrics;  Laterality: N/A;  Incidental Cyystotomy  . CESAREAN SECTION N/A 06/26/2016   Procedure: CESAREAN SECTION;  Surgeon: Aletha Halim, MD;  Location: [place];  Service: Obstetrics;  Laterality: N/A;  . CYSTOTOMY     with repair at 2014 repeat c-section    OB History    Gravida  3   Para  3   Term  3   Preterm      AB      Living  3     SAB      IAB      Ectopic      Multiple  0   Live Births  3            Home Medications    Prior to Admission medications   Medication Sig Start Date End Date Taking? Authorizing Provider  cetirizine (ZYRTEC) 10 MG  tablet Take 1 tablet (10 mg total) by mouth daily. 01/13/20   Loura Halt A, NP  clotrimazole-betamethasone (LOTRISONE) cream Apply 1 application topically 2 (two) times daily. 06/09/20   Charlott Rakes, MD  etonogestrel (NEXPLANON) 68 MG IMPL implant 1 each by Subdermal route once. Implanted October 2017    [provider]  fluticasone (FLONASE) 50 MCG/ACT nasal spray Place 1 spray into both nostrils daily. 08/30/20   Gildardo Pounds, NP  gabapentin (NEURONTIN) 300 MG capsule Take 1 capsule (300 mg total) by mouth 3 (three) times daily. 08/30/20 09/29/20  Gildardo Pounds, NP  mometasone (ELOCON) 0.1 % cream Apply 1 application topically daily. 05/03/20   Gildardo Pounds, NP  pantoprazole (PROTONIX) 20 MG tablet Take 1 tablet (20 mg total) by mouth daily. 08/16/20   Harris, Abigail, PA-C  PROAIR HFA 108 (90 Base) MCG/ACT inhaler INHALE 1 TO 2 PUFFS INTO THE LUNGS EVERY 6 HOURS AS NEEDED FOR WHEEZING OR SHORTNESS OF BREATH 08/18/20   Charlott Rakes, MD  promethazine-dextromethorphan (PROMETHAZINE-DM) 6.25-15 MG/5ML syrup Take 5 mLs by mouth 4 (four) times daily as needed for cough. 07/22/20   Volney American, PA-C  Vitamin D,  Ergocalciferol, (DRISDOL) 1.25 MG (50000 UNIT) CAPS capsule Take 1 capsule (50,000 Units total) by mouth every 7 (seven) days. 08/30/20   Claiborne Rigg, NP    Family History Family History  Problem Relation Age of Onset  . Diabetes Mother   . Diabetes Sister   . Diabetes Brother   . Hypertension Brother   . Diabetes Sister   . Diabetes Sister   . Autism Son   . Healthy Son   . Healthy Daughter   . Other Neg Hx     Social History Social History   Tobacco Use  . Smoking status: Never Smoker  . Smokeless tobacco: Never Used  Vaping Use  . Vaping Use: Never used  Substance Use Topics  . Alcohol use: No  . Drug use: No     Allergies   Other   Review of Systems Review of Systems PER HPI    Physical Exam Triage Vital Signs ED Triage  Vitals  Enc Vitals Group     BP 09/18/20 1332 115/82     Pulse Rate 09/18/20 1332 88     Resp 09/18/20 1332 16     Temp 09/18/20 1332 99 F (37.2 C)     Temp Source 09/18/20 1332 Oral     SpO2 09/18/20 1332 100 %     Weight --      Height --      Head Circumference --      Peak Flow --      Pain Score 09/18/20 1334 10     Pain Loc --      Pain Edu? --      Excl. in GC? --    No data found.  Updated Vital Signs BP 115/82 (BP Location: Right Arm)   Pulse 88   Temp 99 F (37.2 C) (Oral)   Resp 16   LMP 09/04/2020   SpO2 100%   Visual Acuity Right Eye Distance:   Left Eye Distance:   Bilateral Distance:    Right Eye Near:   Left Eye Near:    Bilateral Near:     Physical Exam Vitals and nursing note reviewed.  Constitutional:      Appearance: Normal appearance. She is not ill-appearing.  HENT:     Head: Atraumatic.     Right Ear: Tympanic membrane normal.     Left Ear: Tympanic membrane normal.     Nose: Rhinorrhea present.     Mouth/Throat:     Mouth: Mucous membranes are moist.     Pharynx: Oropharynx is clear. Posterior oropharyngeal erythema present.  Eyes:     Extraocular Movements: Extraocular movements intact.     Conjunctiva/sclera: Conjunctivae normal.  Cardiovascular:     Rate and Rhythm: Normal rate and regular rhythm.     Heart sounds: Normal heart sounds.  Pulmonary:     Effort: Pulmonary effort is normal. No respiratory distress.     Breath sounds: Normal breath sounds. No wheezing or rales.  Abdominal:     General: Bowel sounds are normal. There is no distension.     Palpations: Abdomen is soft.     Tenderness: There is no abdominal tenderness. There is no right CVA tenderness, left CVA tenderness or guarding.  Musculoskeletal:        General: Normal range of motion.     Cervical back: Normal range of motion and neck supple.  Skin:    General: Skin is warm and dry.  Neurological:  Mental Status: She is alert and oriented to person,  place, and time.  Psychiatric:        Mood and Affect: Mood normal.        Thought Content: Thought content normal.        Judgment: Judgment normal.      UC Treatments / Results  Labs (all labs ordered are listed, but only abnormal results are displayed) Labs Reviewed  RESP PANEL BY RT-PCR (FLU A&B, COVID) [id number]    EKG   Radiology No results found.  Procedures Procedures (including critical care time)  Medications Ordered in UC Medications - No data to display  Initial Impression / Assessment and Plan / UC Course  I have reviewed the triage vital signs and the nursing notes.  Pertinent labs & imaging results that were available during my care of the patient were reviewed by me and considered in my medical decision making (see chart for details).     Vitals, exam reassuring, suspect viral illness. Resp panel pending, work note given, isolation reviewed. Discussed OTC medications and supportive care. Return precautions given.   Final Clinical Impressions(s) / UC Diagnoses   Final diagnoses:  Viral URI with cough   Discharge Instructions   None    ED Prescriptions    None     PDMP not reviewed this encounter.   Volney American, [place] 09/18/20 1547

## 2020-09-21 ENCOUNTER — Other Ambulatory Visit: Payer: Self-pay | Admitting: Family Medicine

## 2020-09-22 ENCOUNTER — Telehealth: Payer: Self-pay | Admitting: Nurse Practitioner

## 2020-09-22 NOTE — Telephone Encounter (Signed)
Copied from CRM [id number]. Topic: General - Inquiry >> Sep 20, 2020 12:13 PM Adrian Prince D wrote: Reason for CRM: Patiet would like a call back, she is requesting some cough medication. She can be reached at [phone]. Please advise

## 2020-09-23 ENCOUNTER — Ambulatory Visit: Payer: Self-pay | Admitting: *Deleted

## 2020-09-23 ENCOUNTER — Other Ambulatory Visit: Payer: Self-pay | Admitting: Nurse Practitioner

## 2020-09-23 MED ORDER — GUAIFENESIN-DM 100-10 MG/5ML PO SYRP: 10.0000 mL | ORAL_SOLUTION | ORAL | 0 refills | Status: DC | PRN

## 2020-09-23 MED ORDER — BENZONATATE 100 MG PO CAPS: 200.0000 mg | ORAL_CAPSULE | Freq: Three times a day (TID) | ORAL | 0 refills | Status: DC | PRN

## 2020-09-23 NOTE — Telephone Encounter (Signed)
Sent. She should also be using her inhaler as prescribed.

## 2020-09-23 NOTE — Telephone Encounter (Signed)
Spoke to patient and informed on Rx. Pt. Understood.

## 2020-09-23 NOTE — Telephone Encounter (Signed)
Patient is calling to request cough medication for her COVID cough. Patient states she was diagnosed with COVID and she started coughing on Monday- patient states she has coughed so much she is having headache and chest soreness. Patient advised would need virtual visit and the office does not have any today. Advised UC or virtual visit at [email or web address]. Patient declines these potions- she states she just wants her PCP to send cough medication for her. Patient advised I would send her request- but can not quarentine a prescription for her.  Reason for Disposition . [1] Continuous (nonstop) coughing interferes with work or school AND [2] no improvement using cough treatment per Care Advice  Answer Assessment - Initial Assessment Questions 1. ONSET: "When did the cough begin?"      Monday 2. SEVERITY: "How bad is the cough today?"      Cough all the time 3. SPUTUM: "Describe the color of your sputum" (none, dry cough; clear, white, yellow, green)     Dry cough 4. HEMOPTYSIS: "Are you coughing up any blood?" If so ask: "How much?" (flecks, streaks, tablespoons, etc.)     no 5. DIFFICULTY BREATHING: "Are you having difficulty breathing?" If Yes, ask: "How bad is it?" (e.g., mild, moderate, severe)    - MILD: No SOB at rest, mild SOB with walking, speaks normally in sentences, can lay down, no retractions, pulse < 100.    - MODERATE: SOB at rest, SOB with minimal exertion and prefers to sit, cannot lie down flat, speaks in phrases, mild retractions, audible wheezing, pulse 100-120.    - SEVERE: Very SOB at rest, speaks in single words, struggling to breathe, sitting hunched forward, retractions, pulse > 120      no 6. FEVER: "Do you have a fever?" If Yes, ask: "What is your temperature, how was it measured, and when did it start?"     3 days- none today 7. CARDIAC HISTORY: "Do you have any history of heart disease?" (e.g., heart attack, congestive heart failure)       8. LUNG HISTORY: "Do you have  any history of lung disease?"  (e.g., pulmonary embolus, asthma, emphysema)      9. PE RISK FACTORS: "Do you have a history of blood clots?" (or: recent major surgery, recent prolonged travel, bedridden)      10. OTHER SYMPTOMS: "Do you have any other symptoms?" (e.g., runny nose, wheezing, chest pain)       Headache, sore throat- cheat pain with cough 11. PREGNANCY: "Is there any chance you are pregnant?" "When was your last menstrual period?"       no 12. TRAVEL: "Have you traveled out of the country in the last month?" (e.g., travel history, exposures)      n/a  Protocols used: [place]

## 2020-10-06 ENCOUNTER — Ambulatory Visit: Payer: Self-pay

## 2020-10-06 NOTE — Telephone Encounter (Signed)
Patient callled stating that she was Dx COVID-19 positive 10 days ago.  She is calling today because the cough medication she was give is not working and she is requesting a liquid medication for cough.  She states that her chest hurts from too much coughing.  She also states that she needs acid reflux medication ordered. Protonix request is attached to note (pharmacy verified).  She states that she must return to work and really needs med to control her cough. Please review and advise patient. Office presently closed for lunch.  Reason for Disposition . [1] Caller has URGENT question AND [2] triager unable to answer question  Answer Assessment - Initial Assessment Questions 1. COVID-19 ONSET: "When did the symptoms of COVID-19 first start?"     Tested Jan 16th 2. DIAGNOSIS CONFIRMATION: "How were you diagnosed?" (e.g., COVID-19 oral or nasal viral test; COVID-19 antibody test; doctor visit)     yes 3. MAIN SYMPTOM:  "What is your main concern or symptom right now?" (e.g., breathing difficulty, cough, fatigue. loss of smell)     Cough and pain 4. SYMPTOM ONSET: "When did the  symptoms  start?"     ongoing 5. BETTER-SAME-WORSE: "Are you getting better, staying the same, or getting worse over the last 1 to 2 weeks?"     same 6. RECENT MEDICAL VISIT: "Have you been seen by a healthcare provider (doctor, NP, PA) for these persisting COVID-19 symptoms?" If Yes, ask: "When were you seen?" (e.g., date)     yes 7. COUGH: "Do you have a cough?" If Yes, ask: "How bad is the cough?"      Cough causes pain 8. FEVER: "Do you have a fever?" If Yes, ask: "What is your temperature, how was it measured, and when did it start?"     No fever 9. BREATHING DIFFICULTY: "Are you having any trouble breathing?" If Yes, ask: "How bad is your breathing?" (e.g., mild, moderate, severe)    - MILD: No SOB at rest, mild SOB with walking, speaks normally in sentences, can lie down, no retractions, pulse < 100.    -  MODERATE: SOB at rest, SOB with minimal exertion and prefers to sit, cannot lie down flat, speaks in phrases, mild retractions, audible wheezing, pulse 100-120.    - SEVERE: Very SOB at rest, speaks in single words, struggling to breathe, sitting hunched forward, retractions, pulse > 120       Needs liquid  10. HIGH RISK DISEASE: "Do you have any chronic medical problems?" (e.g., asthma, heart or lung disease, weak immune system, obesity, etc.)       no 11. VACCINE: "Have you gotten the COVID-19 vaccine?" If Yes ask: "Which one, how many shots, when did you get it?"       yes 12. PREGNANCY: "Is there any chance you are pregnant?" "When was your last menstrual period?"       no 13. OTHER SYMPTOMS: "Do you have any other symptoms?"  (e.g., fatigue, headache, muscle pain, weakness)       Acid reflux  Protocols used: CORONAVIRUS (COVID-19) PERSISTING SYMPTOMS FOLLOW-UP CALL-A-AH

## 2020-10-07 ENCOUNTER — Other Ambulatory Visit: Payer: Self-pay | Admitting: Nurse Practitioner

## 2020-10-07 MED ORDER — PANTOPRAZOLE SODIUM 20 MG PO TBEC: 20.0000 mg | DELAYED_RELEASE_TABLET | Freq: Every day | ORAL | 1 refills | Status: DC

## 2020-10-07 MED ORDER — PROAIR HFA 108 (90 BASE) MCG/ACT IN AERS: INHALATION_SPRAY | RESPIRATORY_TRACT | 0 refills | Status: DC

## 2020-10-07 MED ORDER — BENZONATATE 200 MG PO CAPS: 200.0000 mg | ORAL_CAPSULE | Freq: Three times a day (TID) | ORAL | 0 refills | Status: DC | PRN

## 2020-10-07 MED ORDER — GUAIFENESIN-DM 100-10 MG/5ML PO SYRP: 10.0000 mL | ORAL_SOLUTION | ORAL | 0 refills | Status: DC | PRN

## 2020-10-17 ENCOUNTER — Telehealth: Payer: Self-pay | Admitting: Nurse Practitioner

## 2020-10-17 NOTE — Telephone Encounter (Signed)
Patient is calling to ask if her appt can be in office tomorrow. Patient reports she does not have cough or fever for 3 weeks. Patient wants to also get checked for frequent urination and lower back pain. Please advise CB- [phone]

## 2020-10-18 ENCOUNTER — Other Ambulatory Visit: Payer: Self-pay

## 2020-10-18 ENCOUNTER — Encounter: Payer: Self-pay | Admitting: Nurse Practitioner

## 2020-10-18 ENCOUNTER — Ambulatory Visit: Payer: Medicaid Other | Attending: Nurse Practitioner | Admitting: Nurse Practitioner

## 2020-10-18 DIAGNOSIS — R309 Painful micturition, unspecified: Secondary | ICD-10-CM | POA: Diagnosis not present

## 2020-10-18 NOTE — Telephone Encounter (Signed)
Copied from [place] [phone]. Topic: General - Other >> Oct 18, 2020 10:11 AM Yvette Rack wrote: Reason for CRM: Pt returned call to office. Attempted to transfer pt to office but there was no answer. Pt stated she does not have a cup and requests call back

## 2020-10-18 NOTE — Progress Notes (Signed)
Virtual Visit via Telephone Note Due to national recommendations of social distancing due to [place] 19, telehealth visit is felt to be most appropriate for this patient at this time.  I discussed the limitations, risks, security and privacy concerns of performing an evaluation and management service by telephone and the availability of in person appointments. I also discussed with the patient that there may be a patient responsible charge related to this service. The patient expressed understanding and agreed to proceed.    I connected with Michaela Moran on 10/18/20  at   4:10 PM EST  EDT by telephone and verified that I am speaking with the correct person using two identifiers.   Consent I discussed the limitations, risks, security and privacy concerns of performing an evaluation and management service by telephone and the availability of in person appointments. I also discussed with the patient that there may be a patient responsible charge related to this service. The patient expressed understanding and agreed to proceed.   Location of Patient: Private Residence   Location of Provider: [place] and [place] participating in Telemedicine visit: Geryl Rankins FNP-BC [place]    History of Present Illness: Telemedicine visit for: GU symptoms  Patient complains of burning with urination, dysuria, bilateral flank pain, incomplete bladder emptying and vaginal itching She has had symptoms for several days. Patient also complains of back pain. Patient denies fever and vaginal discharge. Patient does not have a history of recurrent UTI.  Patient does not have a history of pyelonephritis.    Past Medical History:  Diagnosis Date  . Acid reflux   . Female circumcision   . Hemorrhoid   . 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 Hypercholesteremia     Past Surgical History:  Procedure Laterality Date  . CESAREAN SECTION    . CESAREAN SECTION N/A  01/02/2013   Procedure: CESAREAN SECTION;  Surgeon: Donnamae Jude, MD;  Location: [facility];  Service: Obstetrics;  Laterality: N/A;  Incidental Cyystotomy  . CESAREAN SECTION N/A 06/26/2016   Procedure: CESAREAN SECTION;  Surgeon: Aletha Halim, MD;  Location: [facility];  Service: Obstetrics;  Laterality: N/A;  . CYSTOTOMY     with repair at 2014 repeat c-section    Family History  Problem Relation Age of Onset  . Diabetes Mother   . Diabetes Sister   . Diabetes Brother   . Hypertension Brother   . Diabetes Sister   . Diabetes Sister   . Autism Son   . Healthy Son   . Healthy Daughter   . Other Neg Hx     Social History   Socioeconomic History  . Marital status: Married    Spouse name: Not on file  . Number of children: Not on file  . Years of education: Not on file  . Highest education level: Not on file  Occupational History  . Not on file  Tobacco Use  . Smoking status: Never Smoker  . Smokeless tobacco: Never Used  Vaping Use  . Vaping Use: Never used  Substance and Sexual Activity  . Alcohol use: No  . Drug use: No  . Sexual activity: Yes    Birth control/protection: None  Other Topics Concern  . Not on file  Social History Narrative  . Not on file   Social Determinants of Health   Financial Resource Strain: Not on file  Food Insecurity: Not on file  Transportation  Needs: Not on file  Physical Activity: Not on file  Stress: Not on file  Social Connections: Not on file     Observations/Objective: Awake, alert and oriented x 3   Review of Systems  Constitutional: Negative for fever, malaise/fatigue and weight loss.  HENT: Negative.  Negative for nosebleeds.   Eyes: Negative.  Negative for blurred vision, double vision and photophobia.  Respiratory: Negative.  Negative for cough and shortness of breath.   Cardiovascular: Negative.  Negative for chest pain, palpitations and leg swelling.  Gastrointestinal: Negative.  Negative for heartburn,  nausea and vomiting.  Genitourinary: Positive for dysuria, flank pain and frequency.  Musculoskeletal: Positive for back pain. Negative for myalgias.  Neurological: Negative.  Negative for dizziness, focal weakness, seizures and headaches.  Psychiatric/Behavioral: Negative.  Negative for suicidal ideas.    Assessment and Plan: Michaela Moran was seen today for back pain.  Diagnoses and all orders for this visit:  Painful urination -     POCT URINALYSIS DIP (CLINITEK); Future -     Cervicovaginal ancillary only; Future     Follow Up Instructions Return in about 8 weeks (around 12/13/2020).     I discussed the assessment and treatment plan with the patient. The patient was provided an opportunity to ask questions and all were answered. The patient agreed with the plan and demonstrated an understanding of the instructions.   The patient was advised to call back or seek an in-person evaluation if the symptoms worsen or if the condition fails to improve as anticipated.  I provided 9 minutes of non-face-to-face time during this encounter including median intraservice time, reviewing previous notes, labs, imaging, medications and explaining diagnosis and management.  Gildardo Pounds, FNP-BC

## 2020-10-18 NOTE — Telephone Encounter (Signed)
Attempt to reach patient. No answer and LVM. She can dropped off a urine specimen before her virtual appt.

## 2020-10-18 NOTE — Telephone Encounter (Signed)
Copied from [place] [phone]. Topic: General - Other >> Oct 17, 2020  1:09 PM Hinda Lenis D wrote: PT need an appt in person for tomorrow not virtual / please advise

## 2020-10-18 NOTE — Telephone Encounter (Signed)
Noted. Provider will see patient in the office.

## 2020-10-19 ENCOUNTER — Other Ambulatory Visit: Payer: Self-pay | Admitting: Family Medicine

## 2020-10-19 ENCOUNTER — Ambulatory Visit: Payer: Medicaid Other | Attending: Nurse Practitioner

## 2020-10-19 ENCOUNTER — Other Ambulatory Visit: Payer: Self-pay

## 2020-10-19 ENCOUNTER — Other Ambulatory Visit (HOSPITAL_COMMUNITY)
Admission: RE | Admit: 2020-10-19 | Discharge: 2020-10-19 | Disposition: A | Discharge status: Home / Self Care | Payer: Medicaid Other | Source: Ambulatory Visit | Attending: Nurse Practitioner | Admitting: Nurse Practitioner

## 2020-10-19 DIAGNOSIS — R309 Painful micturition, unspecified: Secondary | ICD-10-CM | POA: Diagnosis not present

## 2020-10-19 LAB — POCT URINALYSIS DIP (CLINITEK)
Bilirubin, UA: NEGATIVE
Blood, UA: NEGATIVE
Glucose, UA: NEGATIVE mg/dL
Ketones, POC UA: NEGATIVE mg/dL
Leukocytes, UA: NEGATIVE
Nitrite, UA: NEGATIVE
POC PROTEIN,UA: NEGATIVE
Spec Grav, UA: 1.015 (ref 1.010–1.025)
Urobilinogen, UA: 0.2 E.U./dL
pH, UA: 7.5 (ref 5.0–8.0)

## 2020-10-20 LAB — CERVICOVAGINAL ANCILLARY ONLY
Bacterial Vaginitis (gardnerella): NEGATIVE
Candida Glabrata: NEGATIVE
Candida Vaginitis: NEGATIVE
Chlamydia: NEGATIVE
Comment: NEGATIVE
Comment: NEGATIVE
Comment: NEGATIVE
Comment: NEGATIVE
Comment: NEGATIVE
Comment: NORMAL
Neisseria Gonorrhea: NEGATIVE
Trichomonas: NEGATIVE

## 2020-11-01 ENCOUNTER — Telehealth: Payer: Self-pay | Admitting: Nurse Practitioner

## 2020-11-01 NOTE — Telephone Encounter (Signed)
Patient called in asking about lab results. Please follow up.

## 2020-11-01 NOTE — Telephone Encounter (Signed)
Copied from [place] [phone]. Topic: General - Other >> Oct 31, 2020  4:31 PM Alanda Slim E wrote: Reason for CRM: Pt would like a call about her lab results/ please advise

## 2020-11-08 NOTE — Telephone Encounter (Signed)
Pt was calling back to further discuss her lab results.

## 2020-11-08 NOTE — Telephone Encounter (Signed)
Attempt to call patient. No answer.

## 2020-11-10 NOTE — Telephone Encounter (Signed)
Pt calling regarding her results. Please advise.

## 2020-11-10 NOTE — Telephone Encounter (Signed)
Attempt to reach patient to inform her lab results. No answer.

## 2020-11-11 NOTE — Telephone Encounter (Signed)
Pt was called and informed of lab results. 

## 2020-12-26 ENCOUNTER — Other Ambulatory Visit: Payer: Self-pay

## 2020-12-26 ENCOUNTER — Encounter (HOSPITAL_COMMUNITY): Payer: Self-pay | Admitting: Emergency Medicine

## 2020-12-26 ENCOUNTER — Ambulatory Visit (HOSPITAL_COMMUNITY)
Admission: EM | Admit: 2020-12-26 | Discharge: 2020-12-26 | Disposition: A | Discharge status: Home / Self Care | Payer: Medicaid Other | Attending: Emergency Medicine | Admitting: Emergency Medicine

## 2020-12-26 ENCOUNTER — Ambulatory Visit: Payer: Self-pay

## 2020-12-26 DIAGNOSIS — J069 Acute upper respiratory infection, unspecified: Secondary | ICD-10-CM | POA: Diagnosis not present

## 2020-12-26 DIAGNOSIS — J209 Acute bronchitis, unspecified: Secondary | ICD-10-CM | POA: Diagnosis not present

## 2020-12-26 MED ORDER — BENZONATATE 100 MG PO CAPS: 200.0000 mg | ORAL_CAPSULE | Freq: Three times a day (TID) | ORAL | 0 refills | Status: DC | PRN

## 2020-12-26 MED ORDER — PROMETHAZINE-DM 6.25-15 MG/5ML PO SYRP: 5.0000 mL | ORAL_SOLUTION | Freq: Four times a day (QID) | ORAL | 0 refills | Status: DC | PRN

## 2020-12-26 MED ORDER — ALBUTEROL SULFATE HFA 108 (90 BASE) MCG/ACT IN AERS: 1.0000 | INHALATION_SPRAY | Freq: Four times a day (QID) | RESPIRATORY_TRACT | 0 refills | Status: DC | PRN

## 2020-12-26 NOTE — ED Provider Notes (Signed)
[facility]    CSN: [id number] Arrival date & time: 12/26/20  1349      History   Chief Complaint Chief Complaint  Patient presents with  . Cough    HPI Michaela Moran is a 40 y.o. female.   Patient is here for evaluation of cough and headache that has been ongoing for the past 2 days.  Denies any recent sick contacts.  Denies any fevers.  Patient reports being evaluated for similar symptoms several months ago and was given a cough liquid that was helpful.  Reports pills have not been beneficial. Denies any specific alleviating or aggravating factors.  Denies any fevers, chest pain, shortness of breath, N/V/D, numbness, tingling, weakness, abdominal pain, or headaches.   ROS: As per HPI, all other pertinent ROS negative   The history is provided by the patient. The history is limited by a language barrier.  Cough Associated symptoms: no shortness of breath and no wheezing     Past Medical History:  Diagnosis Date  . Acid reflux   . Female circumcision   . Hemorrhoid   . 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 Hypercholesteremia     Patient Active Problem List   Diagnosis Date Noted  . Chronic pain of right knee 06/26/2018  . Encounter for initial prescription of Nexplanon 08/21/2016  . S/P repeat low transverse C-section 06/27/2016  . 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 Previous cesarean delivery, antepartum condition or complication 29/79/8921  . Language barrier, cultural differences 04/28/2016  . Advanced maternal age in multigravida     Past Surgical History:  Procedure Laterality Date  . CESAREAN SECTION    . CESAREAN SECTION N/A 01/02/2013   Procedure: CESAREAN SECTION;  Surgeon: Donnamae Jude, MD;  Location: [facility];  Service: Obstetrics;  Laterality: N/A;  Incidental Cyystotomy  . CESAREAN SECTION N/A 06/26/2016   Procedure: CESAREAN SECTION;  Surgeon: Aletha Halim, MD;  Location: [facility];  Service: Obstetrics;  Laterality: N/A;  . CYSTOTOMY     with repair at 2014 repeat c-section    OB History    Gravida  3   Para  3   Term  3   Preterm      AB      Living  3     SAB      IAB      Ectopic      Multiple  0   Live Births  3            Home Medications    Prior to Admission medications   Medication Sig Start Date End Date Taking? Authorizing Provider  albuterol (VENTOLIN HFA) 108 (90 Base) MCG/ACT inhaler Inhale 1-2 puffs into the lungs every 6 (six) hours as needed for wheezing or shortness of breath. 12/26/20  Yes Pearson Forster, NP  benzonatate (TESSALON PERLES) 100 MG capsule Take 2 capsules (200 mg total) by mouth 3 (three) times daily as needed for cough. 12/26/20  Yes Pearson Forster, NP  pantoprazole (PROTONIX) 20 MG tablet Take 1 tablet (20 mg total) by mouth daily. 10/07/20  Yes Gildardo Pounds, NP  promethazine-dextromethorphan (PROMETHAZINE-DM) 6.25-15 MG/5ML syrup Take 5 mLs by mouth 4 (four) times daily as needed for cough. 12/26/20  Yes Pearson Forster, NP  cetirizine (ZYRTEC) 10 MG tablet Take 1 tablet (10 mg total) by mouth daily. 01/13/20   Orvan July,  NP  clotrimazole-betamethasone (LOTRISONE) cream Apply 1 application topically 2 (two) times daily. 06/09/20   Charlott Rakes, MD  etonogestrel (NEXPLANON) 68 MG IMPL implant 1 each by Subdermal route once. Implanted October 2017    [provider]  fluticasone (FLONASE) 50 MCG/ACT nasal spray Place 1 spray into both nostrils daily. 08/30/20   Gildardo Pounds, NP  gabapentin (NEURONTIN) 300 MG capsule Take 1 capsule (300 mg total) by mouth 3 (three) times daily. 08/30/20 09/29/20  Gildardo Pounds, NP  mometasone (ELOCON) 0.1 % cream Apply 1 application topically daily. 05/03/20   Gildardo Pounds, NP  Vitamin D, Ergocalciferol, (DRISDOL) 1.25 MG (50000 UNIT) CAPS capsule Take 1 capsule (50,000 Units total) by mouth every 7 (seven) days. 08/30/20   Gildardo Pounds, NP    Family History Family History  Problem Relation Age of Onset  . Diabetes Mother   . Diabetes Sister   . Diabetes Brother   . Hypertension Brother   . Diabetes Sister   . Diabetes Sister   . Autism Son   . Healthy Son   . Healthy Daughter   . Other Neg Hx     Social History Social History   Tobacco Use  . Smoking status: Never Smoker  . Smokeless tobacco: Never Used  Vaping Use  . Vaping Use: Never used  Substance Use Topics  . Alcohol use: No  . Drug use: No     Allergies   Other   Review of Systems Review of Systems  Respiratory: Positive for cough and chest tightness. Negative for shortness of breath and wheezing.   All other systems reviewed and are negative.    Physical Exam Triage Vital Signs ED Triage Vitals  Enc Vitals Group     BP 12/26/20 1516 116/83     Pulse Rate 12/26/20 1516 81     Resp 12/26/20 1516 18     Temp 12/26/20 1516 98 F (36.7 C)     Temp Source 12/26/20 1516 Oral     SpO2 12/26/20 1516 99 %     Weight --      Height --      Head Circumference --      Peak Flow --      Pain Score 12/26/20 1512 4     Pain Loc --      Pain Edu? --      Excl. in [place]? --    No data found.  Updated Vital Signs BP 116/83 (BP Location: Right Arm)   Pulse 81   Temp 98 F (36.7 C) (Oral)   Resp 18   LMP 12/23/2020   SpO2 99%   Visual Acuity Right Eye Distance:   Left Eye Distance:   Bilateral Distance:    Right Eye Near:   Left Eye Near:    Bilateral Near:     Physical Exam Vitals and nursing note reviewed.  Constitutional:      General: She is not in acute distress.    Appearance: Normal appearance. She is not ill-appearing, toxic-appearing or diaphoretic.  HENT:     Head: Normocephalic and atraumatic.  Eyes:     Conjunctiva/sclera: Conjunctivae normal.  Cardiovascular:     Rate and Rhythm: Normal rate.     Pulses: Normal pulses.  Pulmonary:     Effort: Pulmonary effort is normal. No tachypnea or bradypnea.      Breath sounds: Examination of the right-upper field reveals wheezing. Wheezing present. No decreased breath sounds,  rhonchi or rales.  Abdominal:     General: Abdomen is flat.  Musculoskeletal:        General: Normal range of motion.     Cervical back: Normal range of motion.  Skin:    General: Skin is warm and dry.  Neurological:     General: No focal deficit present.     Mental Status: She is alert and oriented to person, place, and time.  Psychiatric:        Mood and Affect: Mood normal.      UC Treatments / Results  Labs (all labs ordered are listed, but only abnormal results are displayed) Labs Reviewed - No data to display  EKG   Radiology No results found.  Procedures Procedures (including critical care time)  Medications Ordered in UC Medications - No data to display  Initial Impression / Assessment and Plan / UC Course  I have reviewed the triage vital signs and the nursing notes.  Pertinent labs & imaging results that were available during my care of the patient were reviewed by me and considered in my medical decision making (see chart for details).     Viral URI with cough and acute bronchitis. Assessment negative for red flags or concerns including pneumonia. Albuterol inhaler as needed for cough, shortness of breath, and wheezing.  Tessalon Perles as needed for cough.  Promethazine DM as needed for cough at night.  You can also use OTC medications for cough and congestion.  Encourage fluids and rest.  Follow-up with primary care as needed.  Final Clinical Impressions(s) / UC Diagnoses   Final diagnoses:  Viral URI with cough  Acute bronchitis, unspecified organism     Discharge Instructions     Use the albuterol inhaler as needed for cough and shortness of breath. Use Tessalon Perles as needed for cough. Use the promethazine DM as needed for cough at night.  This can make you sleepy cetyl and take it before driving.  Drink plenty of fluids and  rest.  Follow-up with your primary care provider or return for worsening symptoms or no improvement in the next several days.    ED Prescriptions    Medication Sig Dispense Auth. Provider   promethazine-dextromethorphan (PROMETHAZINE-DM) 6.25-15 MG/5ML syrup Take 5 mLs by mouth 4 (four) times daily as needed for cough. 118 mL Pearson Forster, NP   albuterol (VENTOLIN HFA) 108 (90 Base) MCG/ACT inhaler Inhale 1-2 puffs into the lungs every 6 (six) hours as needed for wheezing or shortness of breath. 18 g Pearson Forster, NP   benzonatate (TESSALON PERLES) 100 MG capsule Take 2 capsules (200 mg total) by mouth 3 (three) times daily as needed for cough. 20 capsule Pearson Forster, NP     PDMP not reviewed this encounter.   Pearson Forster, NP 12/26/20 1545

## 2020-12-26 NOTE — Telephone Encounter (Signed)
Using [employer] [id number]. Patient called and she says she has a cough. She says she is just leaving the urgent care now. I advised to follow the recommendation of the provider and to call the office to follow up. She asked to go ahead and schedule. Advised the earliest appointment is the one already scheduled on 02/08/21 at 1350 with Michaela Caldron, PA-C. She asked if the medications given will help the coughing and what is bronchitis. I explained what bronchitis is and advised the note from the UC says Viral URI, advised the virus will have to run it's course, medications will help with the cough, advised to call back for earlier appointment if symptoms do not improve or get worse. She asked if I could go over her after visit summary from the UC, since there was no interpreter present. Advise of the information on the AVS, patient verbalized understanding.     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

## 2020-12-26 NOTE — Discharge Instructions (Signed)
Use the albuterol inhaler as needed for cough and shortness of breath. Use Tessalon Perles as needed for cough. Use the promethazine DM as needed for cough at night.  This can make you sleepy cetyl and take it before driving.  Drink plenty of fluids and rest.  Follow-up with your primary care provider or return for worsening symptoms or no improvement in the next several days.

## 2020-12-26 NOTE — Telephone Encounter (Signed)
FYI for [employer]

## 2020-12-26 NOTE — Telephone Encounter (Signed)
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 Patient called via interpreter [phone]. No answer, left message on voicemail to call clinic back.

## 2020-12-26 NOTE — ED Triage Notes (Signed)
Cough and headache 2 days ago.

## 2020-12-27 NOTE — Telephone Encounter (Signed)
FYI Pt went to the urgent care 12/26/20

## 2020-12-28 ENCOUNTER — Other Ambulatory Visit: Payer: Self-pay | Admitting: Nurse Practitioner

## 2020-12-28 ENCOUNTER — Ambulatory Visit: Payer: Self-pay | Admitting: *Deleted

## 2020-12-28 DIAGNOSIS — R0981 Nasal congestion: Secondary | ICD-10-CM

## 2020-12-28 DIAGNOSIS — R053 Chronic cough: Secondary | ICD-10-CM

## 2020-12-28 MED ORDER — CETIRIZINE HCL 10 MG PO TABS
10.0000 mg | ORAL_TABLET | Freq: Every day | ORAL | 0 refills | Status: DC
Start: 2020-12-28 — End: 2021-01-28

## 2020-12-28 MED ORDER — FLUTICASONE PROPIONATE 50 MCG/ACT NA SUSP: 1.0000 | Freq: Every day | NASAL | 2 refills | Status: DC

## 2020-12-28 MED ORDER — PREDNISONE 10 MG PO TABS: 10.0000 mg | ORAL_TABLET | Freq: Every day | ORAL | 0 refills | Status: AC

## 2020-12-28 MED ORDER — PANTOPRAZOLE SODIUM 20 MG PO TBEC: 20.0000 mg | DELAYED_RELEASE_TABLET | Freq: Two times a day (BID) | ORAL | 2 refills | Status: DC

## 2020-12-28 NOTE — Telephone Encounter (Signed)
  C/o coughing spells x 2 weeks and now coughing up yellow sputum. C/o chest pain after coughing. Was seen for cough in ED on 12/26/20. Patient reports tessalon and zyrtec not helping with coughing episodes. Patient reports she is unable to go to work with uncontrollable coughing. Denies fever, difficulty breathing, coughing up blood, or vomiting. Patient reports she has tried multiple treatments of drinking warm liquids , cough drops, prescribed medications, honey and it is not helping her severe episodes of coughing. Patient is concerned she is not getting better. No available appt until 02/08/21. Please advise if note can be written for her work and if she can have any other medication for her cough prior to her appt in May. Care advise given. Patient verbalized understanding of care advise and to call back or go to [facility] or ED if symptoms worsen.    Reason for Disposition . [1] Continuous (nonstop) coughing interferes with work or school AND [2] no improvement using cough treatment per Care Advice  Answer Assessment - Initial Assessment Questions 1. ONSET: "When did the cough begin?"      2 weeks ago  2. SEVERITY: "How bad is the cough today?"      Can not stop coughing and chest hurts from coughing 3. SPUTUM: "Describe the color of your sputum" (none, dry cough; clear, white, yellow, green)     Yellow  4. HEMOPTYSIS: "Are you coughing up any blood?" If so ask: "How much?" (flecks, streaks, tablespoons, etc.)     no 5. DIFFICULTY BREATHING: "Are you having difficulty breathing?" If Yes, ask: "How bad is it?" (e.g., mild, moderate, severe)    - MILD: No SOB at rest, mild SOB with walking, speaks normally in sentences, can lay down, no retractions, pulse < 100.    - MODERATE: SOB at rest, SOB with minimal exertion and prefers to sit, cannot lie down flat, speaks in phrases, mild retractions, audible wheezing, pulse 100-120.    - SEVERE: Very SOB at rest, speaks in single words, struggling to breathe,  sitting hunched forward, retractions, pulse > 120      Mild  6. FEVER: "Do you have a fever?" If Yes, ask: "What is your temperature, how was it measured, and when did it start?"     no 7. CARDIAC HISTORY: "Do you have any history of heart disease?" (e.g., heart attack, congestive heart failure)      na 8. LUNG HISTORY: "Do you have any history of lung disease?"  (e.g., pulmonary embolus, asthma, emphysema)     no 9. PE RISK FACTORS: "Do you have a history of blood clots?" (or: recent major surgery, recent prolonged travel, bedridden)     na 10. OTHER SYMPTOMS: "Do you have any other symptoms?" (e.g., runny nose, wheezing, chest pain)       Chest pain after coughin 11. PREGNANCY: "Is there any chance you are pregnant?" "When was your last menstrual period?"       na 12. TRAVEL: "Have you traveled out of the country in the last month?" (e.g., travel history, exposures)       na  Protocols used: [place]

## 2020-12-28 NOTE — Telephone Encounter (Signed)
Additional medications have been sent however I will not be able to provide her a letter to miss work due to cough.

## 2020-12-28 NOTE — Telephone Encounter (Signed)
Medications sent. She has also been referred to pulmonology. They will call her to schedule.

## 2020-12-28 NOTE — Telephone Encounter (Signed)
Will forward to provider  

## 2020-12-29 NOTE — Telephone Encounter (Signed)
Contacted pt to go over provider response pt is aware and doesn't have any questions or concerns  

## 2021-01-28 ENCOUNTER — Emergency Department (HOSPITAL_COMMUNITY): Payer: Medicaid Other

## 2021-01-28 ENCOUNTER — Emergency Department (HOSPITAL_COMMUNITY)
Admission: EM | Admit: 2021-01-28 | Discharge: 2021-01-28 | Disposition: A | Discharge status: Home / Self Care | Payer: Medicaid Other | Attending: Emergency Medicine | Admitting: Emergency Medicine

## 2021-01-28 ENCOUNTER — Other Ambulatory Visit: Payer: Self-pay

## 2021-01-28 DIAGNOSIS — R519 Headache, unspecified: Secondary | ICD-10-CM | POA: Diagnosis not present

## 2021-01-28 DIAGNOSIS — R0789 Other chest pain: Secondary | ICD-10-CM | POA: Diagnosis not present

## 2021-01-28 DIAGNOSIS — H538 Other visual disturbances: Secondary | ICD-10-CM | POA: Insufficient documentation

## 2021-01-28 DIAGNOSIS — R079 Chest pain, unspecified: Secondary | ICD-10-CM | POA: Diagnosis not present

## 2021-01-28 LAB — BASIC METABOLIC PANEL
Anion gap: 8 (ref 5–15)
BUN: 14 mg/dL (ref 6–20)
CO2: 28 mmol/L (ref 22–32)
Calcium: 9 mg/dL (ref 8.9–10.3)
Chloride: 100 mmol/L (ref 98–111)
Creatinine, Ser: 0.95 mg/dL (ref 0.44–1.00)
GFR, Estimated: >60 mL/min (ref >60)
Glucose, Bld: 102 mg/dL — ABNORMAL HIGH (ref 70–99)
Potassium: 3.7 mmol/L (ref 3.5–5.1)
Sodium: 136 mmol/L (ref 135–145)

## 2021-01-28 LAB — CBC
HCT: 39.9 % (ref 36.0–46.0)
Hemoglobin: 13.5 g/dL (ref 12.0–15.0)
MCH: 31.2 pg (ref 26.0–34.0)
MCHC: 33.8 g/dL (ref 30.0–36.0)
MCV: 92.1 fL (ref 80.0–100.0)
Platelets: 290 10*3/uL (ref 150–400)
RBC: 4.33 MIL/uL (ref 3.87–5.11)
RDW: 13.2 % (ref 11.5–15.5)
WBC: 5.7 10*3/uL (ref 4.0–10.5)
nRBC: 0 % (ref 0.0–0.2)

## 2021-01-28 LAB — TROPONIN I (HIGH SENSITIVITY): Troponin I (High Sensitivity): <2 ng/L (ref <18)

## 2021-01-28 LAB — I-STAT BETA HCG BLOOD, ED (MC, WL, AP ONLY): I-stat hCG, quantitative: <5 m[IU]/mL (ref <5)

## 2021-01-28 LAB — SEDIMENTATION RATE: Sed Rate: 14 mm/hr (ref 0–22)

## 2021-01-28 MED ORDER — PREDNISONE 20 MG PO TABS
60.0000 mg | ORAL_TABLET | Freq: Once | ORAL | Status: AC
  Administered 2021-01-28: 60 mg via ORAL
  Filled 2021-01-28: qty 3

## 2021-01-28 MED ORDER — FLUORESCEIN SODIUM 1 MG OP STRP
1.0000 | ORAL_STRIP | Freq: Once | OPHTHALMIC | Status: AC
  Administered 2021-01-28: 1 via OPHTHALMIC
  Filled 2021-01-28: qty 1

## 2021-01-28 MED ORDER — ACETAMINOPHEN 325 MG PO TABS: 325.0000 mg | ORAL_TABLET | Freq: Once | ORAL | Status: DC

## 2021-01-28 MED ORDER — TETRACAINE HCL 0.5 % OP SOLN
1.0000 [drp] | Freq: Once | OPHTHALMIC | Status: AC
  Administered 2021-01-28: 1 [drp] via OPHTHALMIC
  Filled 2021-01-28: qty 4

## 2021-01-28 MED ORDER — PREDNISONE 20 MG PO TABS: 60.0000 mg | ORAL_TABLET | Freq: Every day | ORAL | 0 refills | Status: AC

## 2021-01-28 MED ORDER — ACETAMINOPHEN 325 MG PO TABS
650.0000 mg | ORAL_TABLET | Freq: Once | ORAL | Status: AC
  Administered 2021-01-28: 650 mg via ORAL
  Filled 2021-01-28: qty 2

## 2021-01-28 NOTE — Discharge Instructions (Addendum)
You have been seen and discharged from the emergency department. Your blood work and heart work up was normal. You eye exam was normal. 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  Take the daily steroids as directed.  You need to follow-up with your primary doctor, they may need to refer you to a vascular surgeon for a biopsy of the temporal artery. They will also prescribe you a steroid taper. Take Tylenol and ibuprofen as needed for pain control.  Follow-up with your primary provider for reevaluation and further care. Take home medications as prescribed. If you have any worsening symptoms, worsening headache, vision loss or further concerns for your health please return to an emergency department for further evaluation.

## 2021-01-28 NOTE — ED Notes (Signed)
Pt back from X-ray.  

## 2021-01-28 NOTE — ED Notes (Signed)
Pt not in room at this time

## 2021-01-28 NOTE — ED Notes (Signed)
Patient transported to X-ray 

## 2021-01-28 NOTE — ED Provider Notes (Signed)
[facility] DEPART[facility] Provider Note   CSN: [id number] Arrival date & time: 01/28/21  1449     History Chief Complaint  Patient presents with  . Headache  . Blurred Vision  . Shoulder Pain    Michaela Moran is a 40 y.o. female.  HPI   40 year old female with past medical history of HLD presents the emergency department right-sided headache.  Patient states that this headache developed last night, gradual, right sided, temporal. She was able to sleep without difficulty but the headache was present upon awakening, she locates the headache as the right temple area, describes it as sharp. Chewing and touching the temple makes it worse.  Took 1 dose of Tylenol without significant relief.  Patient feels a scratchy sensation in the right eye and had an episode of blurred vision but this is currently resolved, she states the vision is normal between the left and right eye. No vision loss. Patient states at one point she did have bilateral neck/shoulder pain but this resolved with a dose of Tylenol.  No recent fever or neck stiffness.  Earlier yesterday she had an episode of burning chest pain that self resolved.  No other neurologic complaint.  Past Medical History:  Diagnosis Date  . Acid reflux   . Female circumcision   . Hemorrhoid   . 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 Hypercholesteremia     Patient Active Problem List   Diagnosis Date Noted  . Chronic pain of right knee 06/26/2018  . Encounter for initial prescription of Nexplanon 08/21/2016  . S/P repeat low transverse C-section 06/27/2016  . 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 Previous cesarean delivery, antepartum condition or complication 47/42/5956  . Language barrier, cultural differences 04/28/2016  . Advanced maternal age in multigravida     Past Surgical History:  Procedure Laterality Date  . CESAREAN SECTION    . CESAREAN SECTION  N/A 01/02/2013   Procedure: CESAREAN SECTION;  Surgeon: Donnamae Jude, MD;  Location: [facility];  Service: Obstetrics;  Laterality: N/A;  Incidental Cyystotomy  . CESAREAN SECTION N/A 06/26/2016   Procedure: CESAREAN SECTION;  Surgeon: Aletha Halim, MD;  Location: [facility];  Service: Obstetrics;  Laterality: N/A;  . CYSTOTOMY     with repair at 2014 repeat c-section     OB History    Gravida  3   Para  3   Term  3   Preterm      AB      Living  3     SAB      IAB      Ectopic      Multiple  0   Live Births  3           Family History  Problem Relation Age of Onset  . Diabetes Mother   . Diabetes Sister   . Diabetes Brother   . Hypertension Brother   . Diabetes Sister   . Diabetes Sister   . Autism Son   . Healthy Son   . Healthy Daughter   . Other Neg Hx     Social History   Tobacco Use  . Smoking status: Never Smoker  . Smokeless tobacco: Never Used  Vaping Use  . Vaping Use: Never used  Substance Use Topics  . Alcohol use: No  . Drug use: No    Home Medications Prior to Admission medications  Medication Sig Start Date End Date Taking? Authorizing Provider  acetaminophen (TYLENOL) 500 MG tablet Take 500 mg by mouth every 6 (six) hours as needed for mild pain.   Yes [provider]  etonogestrel (NEXPLANON) 68 MG IMPL implant 1 each by Subdermal route once. Implanted October 2017    [provider]  gabapentin (NEURONTIN) 300 MG capsule Take 1 capsule (300 mg total) by mouth 3 (three) times daily. 08/30/20 09/29/20  Claiborne Rigg, NP  promethazine-dextromethorphan (PROMETHAZINE-DM) 6.25-15 MG/5ML syrup Take 5 mLs by mouth 4 (four) times daily as needed for cough. Patient not taking: No sig reported 12/26/20   Ivette Loyal, NP    Allergies    Other  Review of Systems   Review of Systems  Constitutional: Negative for chills and fever.  HENT: Negative for congestion.   Eyes: Positive for pain, itching and  visual disturbance. Negative for photophobia, discharge and redness.  Respiratory: Negative for shortness of breath.   Cardiovascular: Positive for chest pain.  Gastrointestinal: Negative for abdominal pain, diarrhea and vomiting.  Musculoskeletal: Negative for neck pain and neck stiffness.  Skin: Negative for rash.  Neurological: Positive for headaches.    Physical Exam Updated Vital Signs BP 114/83   Pulse 72   Temp 98.8 F (37.1 C)   Resp 17   SpO2 98%   Physical Exam Vitals and nursing note reviewed.  Constitutional:      Appearance: Normal appearance.  HENT:     Head: Normocephalic.     Comments: Tenderness to palpation of the right temple, no rash    Mouth/Throat:     Mouth: Mucous membranes are moist.  Eyes:     General: No visual field deficit.    Extraocular Movements: Extraocular movements intact.     Pupils: Pupils are equal, round, and reactive to light.     Comments: Baseline scleral discoloration, no scleral icterus or eye redness, no pain with extraocular movements, no pain with light exam, no abrasion on fluorescein stain, normal Tono-Pen pressures in the right eye of 18, 19.  Cardiovascular:     Rate and Rhythm: Normal rate.  Pulmonary:     Effort: Pulmonary effort is normal. No respiratory distress.  Abdominal:     Palpations: Abdomen is soft.     Tenderness: There is no abdominal tenderness.  Skin:    General: Skin is warm.  Neurological:     Mental Status: She is alert and oriented to person, place, and time. Mental status is at baseline.     Cranial Nerves: No cranial nerve deficit or facial asymmetry.  Psychiatric:        Mood and Affect: Mood normal.     ED Results / Procedures / Treatments   Labs (all labs ordered are listed, but only abnormal results are displayed) Labs Reviewed  BASIC METABOLIC PANEL - Abnormal; Notable for the following components:      Result Value   Glucose, Bld 102 (*)    All other components within normal limits   CBC  SEDIMENTATION RATE  I-STAT BETA HCG BLOOD, ED ([facility], [facility], [facility] ONLY)  TROPONIN I (HIGH SENSITIVITY)  TROPONIN I (HIGH SENSITIVITY)    EKG EKG Interpretation  Date/Time:  Saturday Jan 28 2021 15:11:44 EDT Ventricular Rate:  85 PR Interval:  148 QRS Duration: 74 QT Interval:  364 QTC Calculation: 433 R Axis:   37 Text Interpretation: Normal sinus rhythm Normal ECG Confirmed by Coralee Pesa ([id number]) on 01/28/2021 4:15:37 PM  Radiology DG Chest 2 View  Result Date: 01/28/2021 CLINICAL DATA:  Central chest pain EXAM: CHEST - 2 VIEW COMPARISON:  Chest radiograph 08/16/2020 FINDINGS: The cardiomediastinal contours are within normal limits. The lungs are clear. No pneumothorax or pleural effusion. No acute finding in the visualized skeleton. IMPRESSION: No acute cardiopulmonary finding. Electronically Signed   By: Audie Pinto M.D.   On: 01/28/2021 17:04    Procedures Procedures   Medications Ordered in ED Medications  fluorescein ophthalmic strip 1 strip (has no administration in time range)  tetracaine (PONTOCAINE) 0.5 % ophthalmic solution 1 drop (has no administration in time range)  acetaminophen (TYLENOL) tablet 650 mg (650 mg Oral Given 01/28/21 1819)    ED Course  I have reviewed the triage vital signs and the nursing notes.  Pertinent labs & imaging results that were available during my care of the patient were reviewed by me and considered in my medical decision making (see chart for details).    MDM Rules/Calculators/A&P                          41 year old female presents the emergency department with right-sided headache, episode of blurred vision and an episode of burning chest pain/shoulder pain.  Currently just the right-sided headache is present, the other complaints have resolved.  Vitals are normal and stable.    In regards to the neck and shoulder pain, from a cardiac standpoint her work-up is unremarkable.  I would find this to be an atypical  presentation of ACS.  EKG has no ischemic changes, first troponin is less than 2. She is low risk and low HEART.  In regards to the headache this was not sudden onset, low suspicion for ICH.  On exam she has significant tenderness to palpation of the right temporal area.  The right eye appears unremarkable, pressures are normal, fluorescein stain shows no abrasion, visual acuity is intact at this time.  My biggest concern would be for GCA given the temporal tenderness and report of resolved change in vision.  I do not believe that this is a primarily ocular problem with a normal exam.  Given the temporal tenderness I do not feel any head imaging is warranted at this time, no red flags. No ongoing neck pain, low suspicion for vascular dissection. ESR is normal.  Spoke with vascular who recommends steroid treatment and follow-up with primary doctor.  If they continue to have suspicion for GCA they can refer to vascular for outpatient temporal artery biopsy.  Discussed with the patient the daily steroid dose.  She has been prescribed 2 weeks of 60 mg a day.  She will require a taper to be prescribed from her primary doctor, she understands the importance of following up with her primary doctor.  Patient is otherwise neuro intact.  I discussed with her strict return to ED instructions including acutely worsening headache, vision loss, associated neurologic complaint.  Vitals are normal and she is ambulatory at time of discharge.  Patient will be discharged and treated as an outpatient.  Discharge plan and strict return to ED precautions discussed, patient verbalizes understanding and agreement.  Final Clinical Impression(s) / ED Diagnoses Final diagnoses:  None    Rx / DC Orders ED Discharge Orders    None       Lorelle Gibbs, DO 01/28/21 2131

## 2021-01-28 NOTE — ED Triage Notes (Signed)
Pt reports blurred vision in R eye and shoulder/neck pain since 0900 this morning. Mild headache since last night, unrelieved by Tylenol. Also endorses burning in central chest.

## 2021-01-30 ENCOUNTER — Telehealth: Payer: Self-pay

## 2021-01-30 NOTE — Telephone Encounter (Signed)
Transition Care Management Unsuccessful Follow-up Telephone Call  Date of discharge and from where:  01/28/2021 from [facility]  Attempts:  1st Attempt  Reason for unsuccessful TCM follow-up call:  Left voice message

## 2021-01-31 NOTE — Telephone Encounter (Signed)
Interpretor ID: [id number]  Transition Care Management Follow-up Telephone Call  Date of discharge and from where: 01/28/2021 from [facility]  How have you been since you were released from the hospital? Pt stated that she is feeling much better today. Pt stated that she is taking the prednisone that was rx'ed by ED   Any questions or concerns? No  Items Reviewed:  Did the pt receive and understand the discharge instructions provided? Yes   Medications obtained and verified? Yes   Other? No   Any new allergies since your discharge? No   Dietary orders reviewed? n/a  Do you have support at home? Yes   Functional Questionnaire: (I = Independent and D = Dependent) ADLs: I  Bathing/Dressing- I  Meal Prep- I  Eating- I  Maintaining continence- I  Transferring/Ambulation- I  Managing Meds- I   Follow up appointments reviewed:   PCP Hospital f/u appt confirmed? Yes  Scheduled to see Freeman Caldron, PA-C on 02/08/2021 @ 1:50pm.  [facility] f/u appt confirmed? No    Are transportation arrangements needed? No   If their condition worsens, is the pt aware to call PCP or go to the Emergency Dept.? Yes  Was the patient provided with contact information for the PCP's office or ED? Yes  Was to pt encouraged to call back with questions or concerns? Yes

## 2021-02-08 ENCOUNTER — Ambulatory Visit: Payer: Medicaid Other | Admitting: Physician Assistant

## 2021-02-08 NOTE — Progress Notes (Deleted)
Patient ID: Michaela Moran, female   DOB: 05/30/1981, 40 y.o.   MRN: [id number]   After being seen in the ED 01/31/2021: 40 year old female presents the emergency department with right-sided headache, episode of blurred vision and an episode of burning chest pain/shoulder pain.  Currently just the right-sided headache is present, the other complaints have resolved.  Vitals are normal and stable.    In regards to the neck and shoulder pain, from a cardiac standpoint her work-up is unremarkable.  I would find this to be an atypical presentation of ACS.  EKG has no ischemic changes, first troponin is less than 2. She is low risk and low HEART.  In regards to the headache this was not sudden onset, low suspicion for ICH.  On exam she has significant tenderness to palpation of the right temporal area.  The right eye appears unremarkable, pressures are normal, fluorescein stain shows no abrasion, visual acuity is intact at this time.  My biggest concern would be for GCA given the temporal tenderness and report of resolved change in vision.  I do not believe that this is a primarily ocular problem with a normal exam.  Given the temporal tenderness I do not feel any head imaging is warranted at this time, no red flags. No ongoing neck pain, low suspicion for vascular dissection. ESR is normal.  Spoke with vascular who recommends steroid treatment and follow-up with primary doctor.  If they continue to have suspicion for GCA they can refer to vascular for outpatient temporal artery biopsy.  Discussed with the patient the daily steroid dose.  She has been prescribed 2 weeks of 60 mg a day.  She will require a taper to be prescribed from her primary doctor, she understands the importance of following up with her primary doctor.  Patient is otherwise neuro intact.  I discussed with her strict return to ED instructions including acutely worsening headache, vision loss, associated neurologic complaint.  Vitals are normal  and she is ambulatory at time of discharge.  Patient will be discharged and treated as an outpatient.  Discharge plan and strict return to ED precautions discussed, patient verbalizes understanding and agreement.

## 2021-02-15 ENCOUNTER — Telehealth: Payer: Self-pay | Admitting: *Deleted

## 2021-02-15 ENCOUNTER — Ambulatory Visit (INDEPENDENT_AMBULATORY_CARE_PROVIDER_SITE_OTHER): Payer: Medicaid Other | Admitting: Vascular Surgery

## 2021-02-15 ENCOUNTER — Other Ambulatory Visit: Payer: Self-pay

## 2021-02-15 ENCOUNTER — Encounter (HOSPITAL_COMMUNITY): Payer: Self-pay | Admitting: Vascular Surgery

## 2021-02-15 ENCOUNTER — Encounter: Payer: Self-pay | Admitting: Vascular Surgery

## 2021-02-15 VITALS — BP 99/67 | HR 81 | Temp 98.0°F | Resp 20 | Ht 66.0 in | Wt 188.0 lb

## 2021-02-15 DIAGNOSIS — M316 Other giant cell arteritis: Secondary | ICD-10-CM | POA: Diagnosis not present

## 2021-02-15 NOTE — Progress Notes (Signed)
ASSESSMENT & PLAN   POSSIBLE TEMPORAL ARTERITIS: This patient has had right-sided headaches with visual disturbance on the right.  The differential diagnosis includes giant cell arteritis and we have been asked to do a right temporal artery biopsy.  I have discussed the indications for the procedure and the potential complications with the patient and she is agreeable to proceed.  REASON FOR CONSULT:    For right temporal artery biopsy.  The consult is requested by the emergency department.  HPI:   Michaela Moran is a 40 y.o. female who was seen in the emergency department with a right sided headache.  The headache developed on 01/28/2021 and came on gradually.  It was a right temporal headache.  She describes some pain in the right temporal area and also a visual disturbance in the right eye.  She had blurred vision.  Her symptoms resolved.  It was felt that she might potentially have temporal arteritis and was referred for evaluation for a temporal artery biopsy.  Past Medical History:  Diagnosis Date  . Acid reflux   . Female circumcision   . Hemorrhoid   . 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 Hypercholesteremia     Family History  Problem Relation Age of Onset  . Diabetes Mother   . Diabetes Sister   . Diabetes Brother   . Hypertension Brother   . Diabetes Sister   . Diabetes Sister   . Autism Son   . Healthy Son   . Healthy Daughter   . Other Neg Hx     SOCIAL HISTORY: Social History   Tobacco Use  . Smoking status: Never Smoker  . Smokeless tobacco: Never Used  Substance Use Topics  . Alcohol use: No    Allergies  Allergen Reactions  . Other Swelling    Allergic to peas.    Current Outpatient Medications  Medication Sig Dispense Refill  . acetaminophen (TYLENOL) 500 MG tablet Take 500 mg by mouth every 6 (six) hours as needed for mild pain.    Marland Kitchen etonogestrel (NEXPLANON) 68 MG IMPL implant 1 each by Subdermal route once. Implanted October 2017    .  fluticasone (FLONASE) 50 MCG/ACT nasal spray Place 1 spray into both nostrils daily.    . pantoprazole (PROTONIX) 20 MG tablet Take 20 mg by mouth 2 (two) times daily.    Marland Kitchen gabapentin (NEURONTIN) 300 MG capsule Take 1 capsule (300 mg total) by mouth 3 (three) times daily. 90 capsule 3  . promethazine-dextromethorphan (PROMETHAZINE-DM) 6.25-15 MG/5ML syrup Take 5 mLs by mouth 4 (four) times daily as needed for cough. (Patient not taking: No sig reported) 118 mL 0   No current facility-administered medications for this visit.    REVIEW OF SYSTEMS:  [X]  denotes positive finding, [ ]  denotes negative finding Cardiac  Comments:  Chest pain or chest pressure:    Shortness of breath upon exertion:    Short of breath when lying flat:    Irregular heart rhythm:        Vascular    Pain in calf, thigh, or hip brought on by ambulation:    Pain in feet at night that wakes you up from your sleep:     Blood clot in your veins:    Leg swelling:         Pulmonary    Oxygen at home:    Productive cough:     Wheezing:  Neurologic    Sudden weakness in arms or legs:     Sudden numbness in arms or legs:     Sudden onset of difficulty speaking or slurred speech:    Temporary loss of vision in one eye:     Problems with dizziness:         Gastrointestinal    Blood in stool:     Vomited blood:         Genitourinary    Burning when urinating:     Blood in urine:        Psychiatric    Major depression:         Hematologic    Bleeding problems:    Problems with blood clotting too easily:        Skin    Rashes or ulcers:        Constitutional    Fever or chills:    -  PHYSICAL EXAM:   Vitals:   02/15/21 0822  BP: 99/67  Pulse: 81  Resp: 20  Temp: 98 F (36.7 C)  SpO2: 98%  Weight: 188 lb (85.3 kg)  Height: 5\' 6"  (1.676 m)   Body mass index is 30.34 kg/m. GENERAL: The patient is a well-nourished female, in no acute distress. The vital signs are documented  above. CARDIAC: There is a regular rate and rhythm.  VASCULAR: I do not detect carotid bruits. He has palpable radial pulses. She has no significant lower extremity swelling. PULMONARY: There is good air exchange bilaterally without wheezing or rales. NEUROLOGIC: No focal weakness or paresthesias are detected. SKIN: There are no ulcers or rashes noted. PSYCHIATRIC: The patient has a normal affect.  DATA:    LABS: Her sed rate is 14.   Deitra Mayo [facility]: [phone] Office: [phone]

## 2021-02-15 NOTE — Progress Notes (Signed)
EKG: 01/30/21 CXR: 01/28/21 ECHO: denies Stress Test: denies Cardiac Cath: denies  Fasting Blood Sugar- na Checks Blood Sugar_na__ times a day  OSA/CPAP: No  Asa/Blood thinners: No  Anesthesia Review: No  Patient denies shortness of breath, fever, cough, and chest pain at PAT appointment.  Patient verbalized understanding of instructions provided today at the PAT appointment.  Patient asked to review instructions at home and day of surgery.

## 2021-02-15 NOTE — Telephone Encounter (Signed)
Pt calling with concerns regarding upcoming  right temporal artery biopsy scheduled for 02/17/21. States she has a lot of anxiety, questions if this is necessary. Husband on line as well, States "Nurse in family stated she should have a CT or MRI done first."  States pt trusts Ms.Raul Del and would like to speak with her only. States "If Ms. Raul Del thinks it's ok she would feel much better." Assured pt NT would route to practice for PCPs review. Please advise: CB# [phone]

## 2021-02-15 NOTE — H&P (View-Only) (Signed)
ASSESSMENT & PLAN   POSSIBLE TEMPORAL ARTERITIS: This patient has had right-sided headaches with visual disturbance on the right.  The differential diagnosis includes giant cell arteritis and we have been asked to do a right temporal artery biopsy.  I have discussed the indications for the procedure and the potential complications with the patient and she is agreeable to proceed.  REASON FOR CONSULT:    For right temporal artery biopsy.  The consult is requested by the emergency department.  HPI:   Michaela Moran is a 40 y.o. female who was seen in the emergency department with a right sided headache.  The headache developed on 01/28/2021 and came on gradually.  It was a right temporal headache.  She describes some pain in the right temporal area and also a visual disturbance in the right eye.  She had blurred vision.  Her symptoms resolved.  It was felt that she might potentially have temporal arteritis and was referred for evaluation for a temporal artery biopsy.  Past Medical History:  Diagnosis Date  . Acid reflux   . Female circumcision   . Hemorrhoid   . 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 Hypercholesteremia     Family History  Problem Relation Age of Onset  . Diabetes Mother   . Diabetes Sister   . Diabetes Brother   . Hypertension Brother   . Diabetes Sister   . Diabetes Sister   . Autism Son   . Healthy Son   . Healthy Daughter   . Other Neg Hx     SOCIAL HISTORY: Social History   Tobacco Use  . Smoking status: Never Smoker  . Smokeless tobacco: Never Used  Substance Use Topics  . Alcohol use: No    Allergies  Allergen Reactions  . Other Swelling    Allergic to peas.    Current Outpatient Medications  Medication Sig Dispense Refill  . acetaminophen (TYLENOL) 500 MG tablet Take 500 mg by mouth every 6 (six) hours as needed for mild pain.    Marland Kitchen etonogestrel (NEXPLANON) 68 MG IMPL implant 1 each by Subdermal route once. Implanted October 2017    .  fluticasone (FLONASE) 50 MCG/ACT nasal spray Place 1 spray into both nostrils daily.    . pantoprazole (PROTONIX) 20 MG tablet Take 20 mg by mouth 2 (two) times daily.    Marland Kitchen gabapentin (NEURONTIN) 300 MG capsule Take 1 capsule (300 mg total) by mouth 3 (three) times daily. 90 capsule 3  . promethazine-dextromethorphan (PROMETHAZINE-DM) 6.25-15 MG/5ML syrup Take 5 mLs by mouth 4 (four) times daily as needed for cough. (Patient not taking: No sig reported) 118 mL 0   No current facility-administered medications for this visit.    REVIEW OF SYSTEMS:  [X]  denotes positive finding, [ ]  denotes negative finding Cardiac  Comments:  Chest pain or chest pressure:    Shortness of breath upon exertion:    Short of breath when lying flat:    Irregular heart rhythm:        Vascular    Pain in calf, thigh, or hip brought on by ambulation:    Pain in feet at night that wakes you up from your sleep:     Blood clot in your veins:    Leg swelling:         Pulmonary    Oxygen at home:    Productive cough:     Wheezing:  Neurologic    Sudden weakness in arms or legs:     Sudden numbness in arms or legs:     Sudden onset of difficulty speaking or slurred speech:    Temporary loss of vision in one eye:     Problems with dizziness:         Gastrointestinal    Blood in stool:     Vomited blood:         Genitourinary    Burning when urinating:     Blood in urine:        Psychiatric    Major depression:         Hematologic    Bleeding problems:    Problems with blood clotting too easily:        Skin    Rashes or ulcers:        Constitutional    Fever or chills:    -  PHYSICAL EXAM:   Vitals:   02/15/21 0822  BP: 99/67  Pulse: 81  Resp: 20  Temp: 98 F (36.7 C)  SpO2: 98%  Weight: 188 lb (85.3 kg)  Height: 5\' 6"  (1.676 m)   Body mass index is 30.34 kg/m. GENERAL: The patient is a well-nourished female, in no acute distress. The vital signs are documented  above. CARDIAC: There is a regular rate and rhythm.  VASCULAR: I do not detect carotid bruits. He has palpable radial pulses. She has no significant lower extremity swelling. PULMONARY: There is good air exchange bilaterally without wheezing or rales. NEUROLOGIC: No focal weakness or paresthesias are detected. SKIN: There are no ulcers or rashes noted. PSYCHIATRIC: The patient has a normal affect.  DATA:    LABS: Her sed rate is 14.   Deitra Mayo [facility]: [phone] Office: [phone]

## 2021-02-16 ENCOUNTER — Telehealth: Payer: Self-pay

## 2021-02-16 ENCOUNTER — Telehealth: Payer: Self-pay | Admitting: Nurse Practitioner

## 2021-02-16 NOTE — Telephone Encounter (Signed)
Patient called in asking  To speak with Geryl Rankins say that she need a call back before tomorrow she has questions about the procedure for tomorrow. Please call Ph#  [phone]

## 2021-02-16 NOTE — Telephone Encounter (Signed)
Patient's arrival time for surgery tomorrow has changed from 9 am to 10:15 am. Michaela Moran interpreter who then called the patient to inform of time change. Patient interrupted the interpreter prior to informing her of time change and said she was calling her sister who was a nurse and then hung up. Passed along info to the OR that patient may show up at previous arrival time.

## 2021-02-16 NOTE — Telephone Encounter (Signed)
Copied from [place] [id number]. Topic: Appointment Scheduling - Scheduling Inquiry for Clinic >> Feb 15, 2021  1:29 PM Erick Blinks wrote: Reason for CRM: Pt called reporting that she must be seen with PCP prior to surgery this Friday. She has been instructed to have a scan prior to her surgery. Pt was unable to provide any further details.   Best contact: [phone]   Please disregard. Patient message has already been routed to provider.

## 2021-02-17 ENCOUNTER — Ambulatory Visit (HOSPITAL_COMMUNITY): Payer: Medicaid Other | Admitting: Anesthesiology

## 2021-02-17 ENCOUNTER — Encounter (HOSPITAL_COMMUNITY): Payer: Self-pay | Admitting: Vascular Surgery

## 2021-02-17 ENCOUNTER — Other Ambulatory Visit: Payer: Self-pay

## 2021-02-17 ENCOUNTER — Encounter (HOSPITAL_COMMUNITY)
Admission: RE | Disposition: A | Discharge status: Home / Self Care | Payer: Self-pay | Source: Home / Self Care | Attending: Vascular Surgery

## 2021-02-17 ENCOUNTER — Ambulatory Visit (HOSPITAL_COMMUNITY)
Admission: RE | Admit: 2021-02-17 | Discharge: 2021-02-17 | Disposition: A | Discharge status: Home / Self Care | Payer: Medicaid Other | Attending: Vascular Surgery | Admitting: Vascular Surgery

## 2021-02-17 DIAGNOSIS — G4489 Other headache syndrome: Secondary | ICD-10-CM | POA: Diagnosis not present

## 2021-02-17 DIAGNOSIS — Z79899 Other long term (current) drug therapy: Secondary | ICD-10-CM | POA: Diagnosis not present

## 2021-02-17 DIAGNOSIS — M316 Other giant cell arteritis: Secondary | ICD-10-CM | POA: Diagnosis not present

## 2021-02-17 DIAGNOSIS — H539 Unspecified visual disturbance: Secondary | ICD-10-CM | POA: Diagnosis not present

## 2021-02-17 DIAGNOSIS — E7800 Pure hypercholesterolemia, unspecified: Secondary | ICD-10-CM | POA: Diagnosis not present

## 2021-02-17 DIAGNOSIS — K649 Unspecified hemorrhoids: Secondary | ICD-10-CM | POA: Diagnosis not present

## 2021-02-17 DIAGNOSIS — K219 Gastro-esophageal reflux disease without esophagitis: Secondary | ICD-10-CM | POA: Diagnosis not present

## 2021-02-17 DIAGNOSIS — R519 Headache, unspecified: Secondary | ICD-10-CM

## 2021-02-17 HISTORY — PX: ARTERY BIOPSY: SHX891

## 2021-02-17 LAB — POCT PREGNANCY, URINE: Preg Test, Ur: NEGATIVE

## 2021-02-17 SURGERY — BIOPSY TEMPORAL ARTERY
Anesthesia: Monitor Anesthesia Care | Site: Head | Laterality: Right

## 2021-02-17 MED ORDER — MIDAZOLAM HCL 5 MG/5ML IJ SOLN
INTRAMUSCULAR | Status: DC | PRN
  Administered 2021-02-17: 2 mg via INTRAVENOUS

## 2021-02-17 MED ORDER — FENTANYL CITRATE (PF) 100 MCG/2ML IJ SOLN: INTRAMUSCULAR | Status: DC | PRN

## 2021-02-17 MED ORDER — CHLORHEXIDINE GLUCONATE 4 % EX LIQD: 60.0000 mL | Freq: Once | CUTANEOUS | Status: DC

## 2021-02-17 MED ORDER — ORAL CARE MOUTH RINSE: 15.0000 mL | Freq: Once | OROMUCOSAL | Status: AC

## 2021-02-17 MED ORDER — CHLORHEXIDINE GLUCONATE 0.12 % MT SOLN
15.0000 mL | Freq: Once | OROMUCOSAL | Status: AC
  Administered 2021-02-17: 15 mL via OROMUCOSAL
  Filled 2021-02-17: qty 15

## 2021-02-17 MED ORDER — BACITRACIN ZINC 500 UNIT/GM EX OINT
TOPICAL_OINTMENT | CUTANEOUS | Status: AC
  Filled 2021-02-17: qty 28.35

## 2021-02-17 MED ORDER — FENTANYL CITRATE (PF) 250 MCG/5ML IJ SOLN
INTRAMUSCULAR | Status: AC
  Filled 2021-02-17: qty 5

## 2021-02-17 MED ORDER — OXYCODONE HCL 5 MG PO TABS: 5.0000 mg | ORAL_TABLET | ORAL | 0 refills | Status: DC | PRN

## 2021-02-17 MED ORDER — PROPOFOL 500 MG/50ML IV EMUL
INTRAVENOUS | Status: DC | PRN
  Administered 2021-02-17: 150 ug/kg/min via INTRAVENOUS

## 2021-02-17 MED ORDER — MIDAZOLAM HCL 2 MG/2ML IJ SOLN
INTRAMUSCULAR | Status: AC
  Filled 2021-02-17: qty 2

## 2021-02-17 MED ORDER — LACTATED RINGERS IV SOLN: INTRAVENOUS | Status: DC

## 2021-02-17 MED ORDER — SODIUM CHLORIDE 0.9 % IV SOLN: INTRAVENOUS | Status: DC

## 2021-02-17 MED ORDER — FENTANYL CITRATE (PF) 250 MCG/5ML IJ SOLN
INTRAMUSCULAR | Status: DC | PRN
  Administered 2021-02-17: 25 ug via INTRAVENOUS

## 2021-02-17 MED ORDER — CEFAZOLIN SODIUM-DEXTROSE 2-4 GM/100ML-% IV SOLN
2.0000 g | INTRAVENOUS | Status: AC
  Administered 2021-02-17: 2 g via INTRAVENOUS
  Filled 2021-02-17: qty 100

## 2021-02-17 MED ORDER — 0.9 % SODIUM CHLORIDE (POUR BTL) OPTIME
TOPICAL | Status: DC | PRN
  Administered 2021-02-17: 1000 mL

## 2021-02-17 MED ORDER — LIDOCAINE-EPINEPHRINE (PF) 1 %-1:200000 IJ SOLN
INTRAMUSCULAR | Status: DC | PRN
  Administered 2021-02-17: 5 mL

## 2021-02-17 SURGICAL SUPPLY — 35 items
CANISTER SUCT 3000ML PPV (MISCELLANEOUS) ×2 IMPLANT
CNTNR URN SCR LID CUP LEK RST (MISCELLANEOUS) ×1 IMPLANT
CONT SPEC 4OZ STRL OR WHT (MISCELLANEOUS) ×1
COTTONBALL LRG STERILE PKG (GAUZE/BANDAGES/DRESSINGS) ×2 IMPLANT
COVER SURGICAL LIGHT HANDLE (MISCELLANEOUS) ×2 IMPLANT
COVER WAND RF STERILE (DRAPES) ×2 IMPLANT
DECANTER SPIKE VIAL GLASS SM (MISCELLANEOUS) ×2 IMPLANT
DERMABOND ADVANCED (GAUZE/BANDAGES/DRESSINGS) ×1
DERMABOND ADVANCED .7 DNX12 (GAUZE/BANDAGES/DRESSINGS) ×1 IMPLANT
DRAPE OPHTHALMIC 77X100 STRL (CUSTOM PROCEDURE TRAY) ×2 IMPLANT
ELECT NEEDLE TIP 2.8 STRL (NEEDLE) ×2 IMPLANT
ELECT REM PT RETURN 9FT ADLT (ELECTROSURGICAL) ×2
ELECTRODE REM PT RTRN 9FT ADLT (ELECTROSURGICAL) ×1 IMPLANT
GAUZE SPONGE 2X2 8PLY STRL LF (GAUZE/BANDAGES/DRESSINGS) ×1 IMPLANT
GLOVE BIO SURGEON STRL SZ7.5 (GLOVE) ×2 IMPLANT
GLOVE SRG 8 PF TXTR STRL LF DI (GLOVE) ×1 IMPLANT
GLOVE SURG UNDER POLY LF SZ8 (GLOVE) ×1
GOWN STRL REUS W/ TWL LRG LVL3 (GOWN DISPOSABLE) ×3 IMPLANT
GOWN STRL REUS W/TWL LRG LVL3 (GOWN DISPOSABLE) ×3
KIT BASIN OR (CUSTOM PROCEDURE TRAY) ×2 IMPLANT
KIT TURNOVER KIT B (KITS) ×2 IMPLANT
NEEDLE HYPO 25GX1X1/2 BEV (NEEDLE) ×2 IMPLANT
NS IRRIG 1000ML POUR BTL (IV SOLUTION) ×2 IMPLANT
PACK GENERAL/GYN (CUSTOM PROCEDURE TRAY) ×2 IMPLANT
PAD ARMBOARD 7.5X6 YLW CONV (MISCELLANEOUS) ×4 IMPLANT
SPONGE GAUZE 2X2 STER 10/PKG (GAUZE/BANDAGES/DRESSINGS) ×1
SUT MNCRL AB 4-0 PS2 18 (SUTURE) ×2 IMPLANT
SUT PROLENE 6 0 BV (SUTURE) IMPLANT
SUT SILK 3 0 (SUTURE) ×1
SUT SILK 3-0 18XBRD TIE 12 (SUTURE) ×1 IMPLANT
SUT VIC AB 3-0 SH 27 (SUTURE) ×1
SUT VIC AB 3-0 SH 27X BRD (SUTURE) ×1 IMPLANT
SYR CONTROL 10ML LL (SYRINGE) ×2 IMPLANT
TOWEL GREEN STERILE (TOWEL DISPOSABLE) ×2 IMPLANT
WATER STERILE IRR 1000ML POUR (IV SOLUTION) ×2 IMPLANT

## 2021-02-17 NOTE — Transfer of Care (Signed)
Immediate Anesthesia Transfer of Care Note  [name]  Procedure(s) Performed: RIGHT TEMPORAL ARTERY BIOPSY (Right Head)  Patient Location: PACU  Anesthesia Type:MAC  Level of Consciousness: awake, alert  and oriented  Airway & Oxygen Therapy: Patient Spontanous Breathing  Post-op Assessment: Report given to RN and Post -op Vital signs reviewed and stable  Post vital signs: Reviewed and stable  Last Vitals:  Vitals Value Taken Time  BP 96/65 02/17/21 1325  Temp 36.4 C 02/17/21 1325  Pulse 57 02/17/21 1335  Resp 17 02/17/21 1335  SpO2 100 % 02/17/21 1335  Vitals shown include unvalidated device data.  Last Pain:  Vitals:   02/17/21 1325  TempSrc:   PainSc: 0-No pain      Patients Stated Pain Goal: 4 (41/28/78 6767)  Complications: No complications documented.

## 2021-02-17 NOTE — Anesthesia Procedure Notes (Signed)
Procedure Name: MAC Date/Time: 02/17/2021 12:21 PM Performed by: Leonor Liv, CRNA Pre-anesthesia Checklist: Patient identified, Emergency Drugs available, Suction available, Timeout performed and Patient being monitored Patient Re-evaluated:Patient Re-evaluated prior to induction Oxygen Delivery Method: Simple face mask Placement Confirmation: positive ETCO2 Dental Injury: Teeth and Oropharynx as per pre-operative assessment

## 2021-02-17 NOTE — Telephone Encounter (Signed)
FYI pt had procedure today 02/17/21

## 2021-02-17 NOTE — Interval H&P Note (Signed)
History and Physical Interval Note:  02/17/2021 11:16 AM  Michaela Moran  has presented today for surgery, with the diagnosis of [place].  The various methods of treatment have been discussed with the patient and family. After consideration of risks, benefits and other options for treatment, the patient has consented to  Procedure(s): RIGHT TEMPORAL ARTERY BIOPSY (Right) as a surgical intervention.  The patient's history has been reviewed, patient examined, no change in status, stable for surgery.  I have reviewed the patient's chart and labs.  Questions were answered to the patient's satisfaction.     Deitra Mayo

## 2021-02-17 NOTE — Anesthesia Preprocedure Evaluation (Signed)
Anesthesia Evaluation  Patient identified by MRN, date of birth, ID band Patient awake    Reviewed: Allergy & Precautions, NPO status , Patient's Chart, lab work & pertinent test results  Airway Mallampati: II  TM Distance: >3 FB Neck ROM: Full    Dental  (+) Dental Advisory Given, Teeth Intact   Pulmonary neg pulmonary ROS,  Covid-19 Nucleic Acid Test Results Lab Results      Component                Value               Date                      SARSCOV2NAA              POSITIVE (A)        09/18/2020                [place]              NEGATIVE            07/22/2020                [place]              NEGATIVE            01/13/2020              breath sounds clear to auscultation       Cardiovascular negative cardio ROS   Rhythm:Regular     Neuro/Psych negative neurological ROS  negative psych ROS   GI/Hepatic Neg liver ROS, GERD  Medicated and Controlled,  Endo/Other  negative endocrine ROS  Renal/GU negative Renal ROS     Musculoskeletal   Abdominal   Peds  Hematology negative hematology ROS (+)   Anesthesia Other Findings   Reproductive/Obstetrics                             Anesthesia Physical Anesthesia Plan  ASA: I  Anesthesia Plan: MAC   Post-op Pain Management:    Induction: Intravenous  PONV Risk Score and Plan: 2 and Treatment may vary due to age or medical condition  Airway Management Planned: Nasal Cannula  Additional Equipment: None  Intra-op Plan:   Post-operative Plan:   Informed Consent: I have reviewed the patients History and Physical, chart, labs and discussed the procedure including the risks, benefits and alternatives for the proposed anesthesia with the patient or authorized representative who has indicated his/her understanding and acceptance.     Dental advisory given  Plan Discussed with: CRNA and Surgeon  Anesthesia Plan Comments:          Anesthesia Quick Evaluation

## 2021-02-17 NOTE — Op Note (Signed)
    NAMESilena Wyss Moran    MRN: [id number] DOB: 05/25/1981    DATE OF OPERATION: 02/17/2021  PREOP DIAGNOSIS:    Rule out giant cell arteritis  POSTOP DIAGNOSIS:    Same  PROCEDURE:    Right temporal artery biopsy  SURGEON: Judeth Cornfield. Scot Dock, MD  ASSIST: None  ANESTHESIA: Local with sedation  EBL: Minimal  INDICATIONS:    [name] is a 40 y.o. female who is having right temporal headaches and visual disturbance in the right eye.  Temporal arteritis was in the differential diagnosis.  We were asked to perform a right temporal artery biopsy.  FINDINGS:   Normal-appearing right temporal artery  TECHNIQUE:   The patient was taken to the operating room and sedated by anesthesia.  The right temporal area was prepped and draped in usual sterile fashion.  I used bacitracin to separate the hair to allow for an incision over the temporal artery just anterior and above the ear on the right.  A longitudinal incision was made over the temporal artery.  The artery was dissected free beneath the temporal fascia.  An approximately 2 cm length of the artery was taken and ligated it proximally and distally.  It was sent to pathology.  Hemostasis was obtained in the wound.  The fascia was closed with 2 interrupted 3-0 Vicryl's.  The skin was closed with 4-0 Monocryl.  Dermabond was applied.  The patient tolerated the procedure well.  She was transferred to the recovery room in stable condition.  All needle and sponge counts were correct.   Deitra Mayo, MD, FACS [facility] DICTATION:   02/17/2021

## 2021-02-20 ENCOUNTER — Encounter (HOSPITAL_COMMUNITY): Payer: Self-pay | Admitting: Vascular Surgery

## 2021-02-20 LAB — SURGICAL PATHOLOGY

## 2021-02-20 NOTE — Anesthesia Postprocedure Evaluation (Signed)
Anesthesia Post Note  [name]  Procedure(s) Performed: RIGHT TEMPORAL ARTERY BIOPSY (Right Head)     Patient location during evaluation: PACU Anesthesia Type: MAC Level of consciousness: awake and alert Pain management: pain level controlled Vital Signs Assessment: post-procedure vital signs reviewed and stable Respiratory status: spontaneous breathing, nonlabored ventilation, respiratory function stable and patient connected to nasal cannula oxygen Cardiovascular status: stable and blood pressure returned to baseline Postop Assessment: no apparent nausea or vomiting Anesthetic complications: no   No complications documented.  Last Vitals:  Vitals:   02/17/21 1340 02/17/21 1355  BP: 101/69 93/74  Pulse: (!) 51 (!) 55  Resp: 20 (!) 22  Temp:    SpO2: 100% 100%    Last Pain:  Vitals:   02/17/21 1355  TempSrc:   PainSc: 0-No pain                 Ambre Kobayashi

## 2021-03-22 ENCOUNTER — Ambulatory Visit: Payer: Medicaid Other | Admitting: Physician Assistant

## 2021-03-22 NOTE — Progress Notes (Deleted)
Patient ID: Michaela Moran, female   DOB: Jan 26, 1981, 40 y.o.   MRN: [id number]   Martin Majestic to ED 5/14 for HA and blurry vision.  Subsequently had TA biopsy 02/17/2021 that showed A. TEMPORAL ARTERY, RIGHT, BIOPSY:  - Benign muscular vessel.  - No significant inflammation.

## 2021-03-24 ENCOUNTER — Institutional Professional Consult (permissible substitution): Payer: Medicaid Other | Admitting: Pulmonary Disease

## 2021-03-24 NOTE — Progress Notes (Deleted)
Synopsis: Referred in July 2022 for chronic cough by Geryl Rankins, NP  Subjective:   PATIENT ID: Michaela Moran GENDER: female DOB: 12/10/1980, MRN: [id number]   HPI  No chief complaint on file.   Michaela Moran is a 40 year old woman, never smoker with GERD who is referred to pulmonary clinic for chronic cough.     Past Medical History:  Diagnosis Date   Acid reflux    Female circumcision    Hemorrhoid    History of positive PPD    neg CXR   Hypercholesteremia      Family History  Problem Relation Age of Onset   Diabetes Mother    Diabetes Sister    Diabetes Brother    Hypertension Brother    Diabetes Sister    Diabetes Sister    Autism Son    Healthy Son    Healthy Daughter    Other Neg Hx      Social History   Socioeconomic History   Marital status: Married    Spouse name: Not on file   Number of children: Not on file   Years of education: Not on file   Highest education level: Not on file  Occupational History   Not on file  Tobacco Use   Smoking status: Never   Smokeless tobacco: Never  Vaping Use   Vaping Use: Never used  Substance and Sexual Activity   Alcohol use: No   Drug use: No   Sexual activity: Yes    Birth control/protection: None  Other Topics Concern   Not on file  Social History Narrative   Not on file   Social Determinants of Health   Financial Resource Strain: Not on file  Food Insecurity: Not on file  Transportation Needs: Not on file  Physical Activity: Not on file  Stress: Not on file  Social Connections: Not on file  Intimate Partner Violence: Not on file     Allergies  Allergen Reactions   Other Swelling    Allergic to peas.     Outpatient Medications Prior to Visit  Medication Sig Dispense Refill   acetaminophen (TYLENOL) 500 MG tablet Take 500 mg by mouth every 6 (six) hours as needed for mild pain.     etonogestrel (NEXPLANON) 68 MG IMPL implant 1 each by Subdermal route once. Implanted October 2017      fluticasone (FLONASE) 50 MCG/ACT nasal spray Place 1 spray into both nostrils daily.     gabapentin (NEURONTIN) 300 MG capsule Take 1 capsule (300 mg total) by mouth 3 (three) times daily. 90 capsule 3   oxyCODONE (ROXICODONE) 5 MG immediate release tablet Take 1 tablet (5 mg total) by mouth every 4 (four) hours as needed. 15 tablet 0   pantoprazole (PROTONIX) 20 MG tablet Take 20 mg by mouth 2 (two) times daily.     promethazine-dextromethorphan (PROMETHAZINE-DM) 6.25-15 MG/5ML syrup Take 5 mLs by mouth 4 (four) times daily as needed for cough. 118 mL 0   No facility-administered medications prior to visit.    Review of Systems  Constitutional:  Negative for chills, fever, malaise/fatigue and weight loss.  HENT:  Negative for congestion, sinus pain and sore throat.   Eyes: Negative.   Respiratory:  Negative for cough, hemoptysis, sputum production, shortness of breath and wheezing.   Cardiovascular:  Negative for chest pain, palpitations, orthopnea, claudication and leg swelling.  Gastrointestinal:  Negative for abdominal pain, heartburn, nausea and vomiting.  Genitourinary: Negative.   Musculoskeletal:  Negative for joint pain and myalgias.  Skin:  Negative for rash.  Neurological:  Negative for weakness.  Endo/Heme/Allergies: Negative.   Psychiatric/Behavioral: Negative.       Objective:  There were no vitals filed for this visit.   Physical Exam Constitutional:      General: She is not in acute distress.    Appearance: She is not ill-appearing.  HENT:     Head: Normocephalic and atraumatic.  Eyes:     General: No scleral icterus.    Conjunctiva/sclera: Conjunctivae normal.     Pupils: Pupils are equal, round, and reactive to light.  Cardiovascular:     Rate and Rhythm: Normal rate and regular rhythm.     Pulses: Normal pulses.     Heart sounds: Normal heart sounds. No murmur heard. Pulmonary:     Effort: Pulmonary effort is normal.     Breath sounds: Normal breath  sounds. No wheezing, rhonchi or rales.  Abdominal:     General: Bowel sounds are normal.     Palpations: Abdomen is soft.  Musculoskeletal:     Right lower leg: No edema.     Left lower leg: No edema.  Lymphadenopathy:     Cervical: No cervical adenopathy.  Skin:    General: Skin is warm and dry.  Neurological:     General: No focal deficit present.     Mental Status: She is alert.  Psychiatric:        Mood and Affect: Mood normal.        Behavior: Behavior normal.        Thought Content: Thought content normal.        Judgment: Judgment normal.   CBC    Component Value Date/Time   WBC 5.7 01/28/2021 1610   RBC 4.33 01/28/2021 1610   HGB 13.5 01/28/2021 1610   HGB 13.6 05/11/2019 1551   HCT 39.9 01/28/2021 1610   HCT 40.8 05/11/2019 1551   PLT 290 01/28/2021 1610   PLT 286 05/11/2019 1551   MCV 92.1 01/28/2021 1610   MCV 91 05/11/2019 1551   MCH 31.2 01/28/2021 1610   MCHC 33.8 01/28/2021 1610   RDW 13.2 01/28/2021 1610   RDW 12.8 05/11/2019 1551   LYMPHSABS 2.2 12/14/2018 1504   LYMPHSABS 3.0 08/08/2018 1645   MONOABS 0.4 12/14/2018 1504   EOSABS 0.4 12/14/2018 1504   EOSABS 0.2 08/08/2018 1645   BASOSABS 0.1 12/14/2018 1504   BASOSABS 0.1 08/08/2018 1645   BMP Latest Ref Rng & Units 01/28/2021 08/30/2020 08/16/2020  Glucose 70 - 99 mg/dL 102(H) - 98  BUN 6 - 20 mg/dL 14 - 10  Creatinine 0.44 - 1.00 mg/dL 0.95 - 0.84  BUN/Creat Ratio 9 - 23 - - -  Sodium 135 - 145 mmol/L 136 - 140  Potassium 3.5 - 5.1 mmol/L 3.7 3.7 3.4(L)  Chloride 98 - 111 mmol/L 100 - 101  CO2 22 - 32 mmol/L 28 - 29  Calcium 8.9 - 10.3 mg/dL 9.0 - 9.3   Chest imaging: CXR 01/28/21 No interstitial prominence, pleural effusions or opacities noted.   PFT: No flowsheet data found.    Assessment & Plan:   No diagnosis found.  Discussion: ***    Current Outpatient Medications:    acetaminophen (TYLENOL) 500 MG tablet, Take 500 mg by mouth every 6 (six) hours as needed for mild  pain., Disp: , Rfl:    etonogestrel (NEXPLANON) 68 MG IMPL implant, 1 each by Subdermal route once. Implanted October  2017, Disp: , Rfl:    fluticasone (FLONASE) 50 MCG/ACT nasal spray, Place 1 spray into both nostrils daily., Disp: , Rfl:    gabapentin (NEURONTIN) 300 MG capsule, Take 1 capsule (300 mg total) by mouth 3 (three) times daily., Disp: 90 capsule, Rfl: 3   oxyCODONE (ROXICODONE) 5 MG immediate release tablet, Take 1 tablet (5 mg total) by mouth every 4 (four) hours as needed., Disp: 15 tablet, Rfl: 0   pantoprazole (PROTONIX) 20 MG tablet, Take 20 mg by mouth 2 (two) times daily., Disp: , Rfl:    promethazine-dextromethorphan (PROMETHAZINE-DM) 6.25-15 MG/5ML syrup, Take 5 mLs by mouth 4 (four) times daily as needed for cough., Disp: 118 mL, Rfl: 0

## 2021-04-25 ENCOUNTER — Telehealth: Payer: Self-pay | Admitting: Nurse Practitioner

## 2021-04-25 NOTE — Telephone Encounter (Signed)
Call with Interpreter [phone] and left vm appt on 8/11 is virtual. If in person is preferred to call [phone] to reschedule

## 2021-04-27 ENCOUNTER — Other Ambulatory Visit: Payer: Self-pay

## 2021-04-27 ENCOUNTER — Ambulatory Visit: Payer: Medicaid Other | Admitting: Physician Assistant

## 2021-05-18 ENCOUNTER — Other Ambulatory Visit: Payer: Self-pay | Admitting: Physician Assistant

## 2021-05-18 ENCOUNTER — Ambulatory Visit: Payer: Medicaid Other | Attending: Physician Assistant | Admitting: Physician Assistant

## 2021-05-18 ENCOUNTER — Other Ambulatory Visit: Payer: Self-pay

## 2021-06-01 ENCOUNTER — Other Ambulatory Visit: Payer: Self-pay

## 2021-06-01 ENCOUNTER — Ambulatory Visit: Payer: Self-pay | Admitting: *Deleted

## 2021-06-01 ENCOUNTER — Inpatient Hospital Stay (HOSPITAL_COMMUNITY)
Admission: AD | Admit: 2021-06-01 | Discharge: 2021-06-01 | Disposition: A | Discharge status: Home / Self Care | Payer: Medicaid Other | Attending: Obstetrics and Gynecology | Admitting: Obstetrics and Gynecology

## 2021-06-01 DIAGNOSIS — Z79899 Other long term (current) drug therapy: Secondary | ICD-10-CM | POA: Insufficient documentation

## 2021-06-01 DIAGNOSIS — N898 Other specified noninflammatory disorders of vagina: Secondary | ICD-10-CM | POA: Diagnosis not present

## 2021-06-01 DIAGNOSIS — Z3202 Encounter for pregnancy test, result negative: Secondary | ICD-10-CM | POA: Insufficient documentation

## 2021-06-01 DIAGNOSIS — R102 Pelvic and perineal pain: Secondary | ICD-10-CM

## 2021-06-01 DIAGNOSIS — N939 Abnormal uterine and vaginal bleeding, unspecified: Secondary | ICD-10-CM | POA: Diagnosis not present

## 2021-06-01 DIAGNOSIS — L292 Pruritus vulvae: Secondary | ICD-10-CM | POA: Diagnosis not present

## 2021-06-01 LAB — URINALYSIS, ROUTINE W REFLEX MICROSCOPIC
Bacteria, UA: NONE SEEN
Bilirubin Urine: NEGATIVE
Glucose, UA: NEGATIVE mg/dL
Ketones, ur: NEGATIVE mg/dL
Leukocytes,Ua: NEGATIVE
Nitrite: NEGATIVE
Protein, ur: NEGATIVE mg/dL
Specific Gravity, Urine: 1.002 — ABNORMAL LOW (ref 1.005–1.030)
pH: 6 (ref 5.0–8.0)

## 2021-06-01 LAB — POCT PREGNANCY, URINE: Preg Test, Ur: NEGATIVE

## 2021-06-01 MED ORDER — NYSTATIN 100000 UNIT/GM EX CREA: TOPICAL_CREAM | CUTANEOUS | 0 refills | Status: DC

## 2021-06-01 MED ORDER — PRENATAL VITAMINS 28-0.8 MG PO TABS: 1.0000 | ORAL_TABLET | Freq: Every day | ORAL | 5 refills | Status: DC

## 2021-06-01 MED ORDER — IBUPROFEN 600 MG PO TABS: 600.0000 mg | ORAL_TABLET | Freq: Four times a day (QID) | ORAL | 0 refills | Status: DC | PRN

## 2021-06-01 MED ORDER — FLUCONAZOLE 150 MG PO TABS: ORAL_TABLET | ORAL | 0 refills | Status: DC

## 2021-06-01 NOTE — Telephone Encounter (Signed)
Pt called in.   She is c/o abd pain, lower back pain and a headache along with a pain in her side since last week.   She is also c/o seeing blood in her urine. See triage notes.  I have referred her to the urgent care because there are no appts available at [facility] within the 24 hr timeframe indicated on the protocol.  She was agreeable to going to the urgent care.  She still requested an appt even though it wasn't until Oct.   I made it for 07/17/2021 at 9:10 with Geryl Rankins, NP.

## 2021-06-01 NOTE — Telephone Encounter (Signed)
Reason for Disposition  [1] MODERATE pain (e.g., interferes with normal activities) AND [2] pain comes and goes (cramps) AND [3] present > 24 hours  (Exception: pain with Vomiting or Diarrhea - see that Guideline)  Answer Assessment - Initial Assessment Questions 1. LOCATION: "Where does it hurt?"      Pt called in c/o headache, stomach pain, left side pain, and lower back pain. 2. RADIATION: "Does the pain shoot anywhere else?" (e.g., chest, back)     See above 3. ONSET: "When did the pain begin?" (e.g., minutes, hours or days ago)      Started last week.   No vomiting or diarrhea.  I had Covid a few months ago. 4. SUDDEN: "Gradual or sudden onset?"     No fever 5. PATTERN "Does the pain come and go, or is it constant?"    - If constant: "Is it getting better, staying the same, or worsening?"      (Note: Constant means the pain never goes away completely; most serious pain is constant and it progresses)     - If intermittent: "How long does it last?" "Do you have pain now?"     (Note: Intermittent means the pain goes away completely between bouts)     My stomach.   Hurts   I see blood in urine.   6. SEVERITY: "How bad is the pain?"  (e.g., Scale 1-10; mild, moderate, or severe)   - MILD (1-3): doesn't interfere with normal activities, abdomen soft and not tender to touch    - MODERATE (4-7): interferes with normal activities or awakens from sleep, abdomen tender to touch    - SEVERE (8-10): excruciating pain, doubled over, unable to do any normal activities      I'm seeing blood in my urine. 7. RECURRENT SYMPTOM: "Have you ever had this type of stomach pain before?" If Yes, ask: "When was the last time?" and "What happened that time?"      Abd pain, lower back pain and side pain 8. CAUSE: "What do you think is causing the stomach pain?"     I don't know 9. RELIEVING/AGGRAVATING FACTORS: "What makes it better or worse?" (e.g., movement, antacids, bowel movement)     I am peeing a lot 10.  OTHER SYMPTOMS: "Do you have any other symptoms?" (e.g., back pain, diarrhea, fever, urination pain, vomiting)       See above 11. PREGNANCY: "Is there any chance you are pregnant?" "When was your last menstrual period?"       Period finished last week  Protocols used: Abdominal Pain - [facility]

## 2021-06-01 NOTE — MAU Note (Signed)
Pt stated she had a period on 9/7. And then it stopped. Started bleeding again yesterday and having back pain and cramping.

## 2021-06-01 NOTE — MAU Provider Note (Signed)
Not preg, pai, bleeding, yeast  Chief Complaint: Abdominal Pain   None     SUBJECTIVE HPI: Michaela Moran is a 40 y.o. G3P3003  presents to maternity admissions reporting normal menses earlier this month, then onset of vaginal itching, bleeding, and abdominal pain yesterday.   She reports symptoms similar to previous yeast infections that resolved with Diflucan and topical medications.    Location: lower abdomen Quality: cramping Severity: 7/10 on pain scale Duration: 1 day Timing: constant Modifying factors: none Associated signs and symptoms: vaginal bleeding, vaginal itching  HPI  Past Medical History:  Diagnosis Date   Acid reflux    Female circumcision    Hemorrhoid    History of positive PPD    neg CXR   Hypercholesteremia    Past Surgical History:  Procedure Laterality Date   ARTERY BIOPSY Right 02/17/2021   Procedure: RIGHT TEMPORAL ARTERY BIOPSY;  Surgeon: Angelia Mould, MD;  Location: [place];  Service: Vascular;  Laterality: Right;   [place] N/A 01/02/2013   Procedure: CESAREAN SECTION;  Surgeon: Donnamae Jude, MD;  Location: [facility];  Service: Obstetrics;  Laterality: N/A;  Incidental Cyystotomy   CESAREAN SECTION N/A 06/26/2016   Procedure: CESAREAN SECTION;  Surgeon: Aletha Halim, MD;  Location: [facility];  Service: Obstetrics;  Laterality: N/A;   CYSTOTOMY     with repair at 2014 repeat c-section   Social History   Socioeconomic History   Marital status: Married    Spouse name: Not on file   Number of children: Not on file   Years of education: Not on file   Highest education level: Not on file  Occupational History   Not on file  Tobacco Use   Smoking status: Never   Smokeless tobacco: Never  Vaping Use   Vaping Use: Never used  Substance and Sexual Activity   Alcohol use: No   Drug use: No   Sexual activity: Yes    Birth control/protection: None  Other Topics Concern   Not on file  Social History  Narrative   Not on file   Social Determinants of Health   Financial Resource Strain: Not on file  Food Insecurity: Not on file  Transportation Needs: Not on file  Physical Activity: Not on file  Stress: Not on file  Social Connections: Not on file  Intimate Partner Violence: Not on file   No current facility-administered medications on file prior to encounter.   Current Outpatient Medications on File Prior to Encounter  Medication Sig Dispense Refill   acetaminophen (TYLENOL) 500 MG tablet Take 500 mg by mouth every 6 (six) hours as needed for mild pain.     etonogestrel (NEXPLANON) 68 MG IMPL implant 1 each by Subdermal route once. Implanted October 2017     fluticasone (FLONASE) 50 MCG/ACT nasal spray Place 1 spray into both nostrils daily.     gabapentin (NEURONTIN) 300 MG capsule Take 1 capsule (300 mg total) by mouth 3 (three) times daily. 90 capsule 3   oxyCODONE (ROXICODONE) 5 MG immediate release tablet Take 1 tablet (5 mg total) by mouth every 4 (four) hours as needed. 15 tablet 0   pantoprazole (PROTONIX) 20 MG tablet Take 20 mg by mouth 2 (two) times daily.     promethazine-dextromethorphan (PROMETHAZINE-DM) 6.25-15 MG/5ML syrup Take 5 mLs by mouth 4 (four) times daily as needed for cough. 118 mL 0   Allergies  Allergen Reactions   Other Swelling  Allergic to peas.    ROS:  Review of Systems  Constitutional:  Negative for chills, fatigue and fever.  Respiratory:  Negative for shortness of breath.   Cardiovascular:  Negative for chest pain.  Gastrointestinal:  Positive for abdominal pain.  Genitourinary:  Positive for vaginal bleeding and vaginal discharge. Negative for difficulty urinating, dysuria, flank pain, pelvic pain and vaginal pain.  Neurological:  Negative for dizziness and headaches.  Psychiatric/Behavioral: Negative.      I have reviewed patient's Past Medical Hx, Surgical Hx, Family Hx, Social Hx, medications and allergies.   Physical Exam   Patient Vitals for the past 24 hrs:  BP Temp Pulse Resp  06/01/21 1737 124/81 98.3 F (36.8 C) 84 18   Constitutional: Well-developed, well-nourished female in no acute distress.  Cardiovascular: normal rate Respiratory: normal effort GI: Abd soft, non-tender. Pos BS x 4 MS: Extremities nontender, no edema, normal ROM Neurologic: Alert and oriented x 4.  GU: Neg CVAT.  PELVIC EXAM: Deferred   LAB RESULTS Results for orders placed or performed during the hospital encounter of 06/01/21 (from the past 24 hour(s))  Pregnancy, urine POC     Status: None   Collection Time: 06/01/21  5:07 PM  Result Value Ref Range   Preg Test, Ur NEGATIVE NEGATIVE       IMAGING No results found.  MAU Management/MDM: Orders Placed This Encounter  Procedures   Urinalysis, Routine w reflex microscopic   Pregnancy, urine POC   Discharge patient    Meds ordered this encounter  Medications   ibuprofen (ADVIL) 600 MG tablet    Sig: Take 1 tablet (600 mg total) by mouth every 6 (six) hours as needed.    Dispense:  30 tablet    Refill:  0    Order Specific Question:   Supervising Provider    Answer:   Janyth Pupa [id number]   fluconazole (DIFLUCAN) 150 MG tablet    Sig: Take one tablet now and one tablet in 3 days    Dispense:  2 tablet    Refill:  0    Order Specific Question:   Supervising Provider    Answer:   Janyth Pupa [id number]   nystatin cream (MYCOSTATIN)    Sig: Apply to affected area 2 times daily    Dispense:  30 g    Refill:  0    Order Specific Question:   Supervising Provider    Answer:   Janyth Pupa [id number]   Prenatal Vit-Fe Fumarate-FA (PRENATAL VITAMINS) 28-0.8 MG TABS    Sig: Take 1 tablet by mouth daily.    Dispense:  30 tablet    Refill:  5    Order Specific Question:   Supervising Provider    Answer:   Janyth Pupa [id number]    Pt was seen in triage, and due to negative pregnancy test, was not evaluated in MAU patient room.  Pt had similar symptoms  with previous yeast infection, so treatment for yeast sent to pharmacy.  Also, pt would like to plan a pregnancy so PNV sent to pharmacy at pt request.  Pt to f/u with OB/Gyn, Urgent Care, or ED if symptoms persist.  Pt discharged with strict return precautions.  ASSESSMENT 1. Vagina bleeding   2. Vaginal itching   3. Acute pelvic pain, female     PLAN Discharge home Allergies as of 06/01/2021       Reactions   Other Swelling   Allergic to peas.  Medication List     TAKE these medications    acetaminophen 500 MG tablet Commonly known as: TYLENOL Take 500 mg by mouth every 6 (six) hours as needed for mild pain.   etonogestrel 68 MG Impl implant Commonly known as: NEXPLANON 1 each by Subdermal route once. Implanted October 2017   fluconazole 150 MG tablet Commonly known as: DIFLUCAN Take one tablet now and one tablet in 3 days   fluticasone 50 MCG/ACT nasal spray Commonly known as: FLONASE Place 1 spray into both nostrils daily.   gabapentin 300 MG capsule Commonly known as: NEURONTIN Take 1 capsule (300 mg total) by mouth 3 (three) times daily.   ibuprofen 600 MG tablet Commonly known as: ADVIL Take 1 tablet (600 mg total) by mouth every 6 (six) hours as needed.   nystatin cream Commonly known as: MYCOSTATIN Apply to affected area 2 times daily   oxyCODONE 5 MG immediate release tablet Commonly known as: Roxicodone Take 1 tablet (5 mg total) by mouth every 4 (four) hours as needed.   pantoprazole 20 MG tablet Commonly known as: PROTONIX Take 20 mg by mouth 2 (two) times daily.   Prenatal Vitamins 28-0.8 MG Tabs Take 1 tablet by mouth daily.   promethazine-dextromethorphan 6.25-15 MG/5ML syrup Commonly known as: PROMETHAZINE-DM Take 5 mLs by mouth 4 (four) times daily as needed for cough.        Follow-up Information     [facility] Follow up.   Specialty: Urgent Care Why: As needed for emergencies Contact  information: [place] Certified Nurse-Midwife 06/01/2021  7:31 PM

## 2021-06-01 NOTE — MAU Note (Signed)
Provider talked withj pt and sent RX as discussed. Pt to follow up with primary care if symptoms persist.

## 2021-06-03 ENCOUNTER — Emergency Department (HOSPITAL_COMMUNITY): Payer: Medicaid Other

## 2021-06-03 ENCOUNTER — Ambulatory Visit (HOSPITAL_COMMUNITY)
Admission: EM | Admit: 2021-06-03 | Discharge: 2021-06-03 | Disposition: A | Discharge status: Home / Self Care | Payer: Medicaid Other | Attending: Internal Medicine | Admitting: Internal Medicine

## 2021-06-03 ENCOUNTER — Emergency Department (HOSPITAL_COMMUNITY)
Admission: EM | Admit: 2021-06-03 | Discharge: 2021-06-03 | Disposition: A | Discharge status: Home / Self Care | Payer: Medicaid Other | Attending: Emergency Medicine | Admitting: Emergency Medicine

## 2021-06-03 ENCOUNTER — Other Ambulatory Visit: Payer: Self-pay

## 2021-06-03 ENCOUNTER — Encounter (HOSPITAL_COMMUNITY): Payer: Self-pay | Admitting: Emergency Medicine

## 2021-06-03 ENCOUNTER — Encounter (HOSPITAL_COMMUNITY): Payer: Self-pay | Admitting: *Deleted

## 2021-06-03 DIAGNOSIS — N926 Irregular menstruation, unspecified: Secondary | ICD-10-CM

## 2021-06-03 DIAGNOSIS — N939 Abnormal uterine and vaginal bleeding, unspecified: Secondary | ICD-10-CM | POA: Diagnosis not present

## 2021-06-03 DIAGNOSIS — R319 Hematuria, unspecified: Secondary | ICD-10-CM

## 2021-06-03 DIAGNOSIS — N9489 Other specified conditions associated with female genital organs and menstrual cycle: Secondary | ICD-10-CM | POA: Diagnosis not present

## 2021-06-03 DIAGNOSIS — D259 Leiomyoma of uterus, unspecified: Secondary | ICD-10-CM

## 2021-06-03 DIAGNOSIS — R1030 Lower abdominal pain, unspecified: Secondary | ICD-10-CM | POA: Diagnosis not present

## 2021-06-03 DIAGNOSIS — N924 Excessive bleeding in the premenopausal period: Secondary | ICD-10-CM | POA: Diagnosis not present

## 2021-06-03 DIAGNOSIS — R109 Unspecified abdominal pain: Secondary | ICD-10-CM | POA: Diagnosis not present

## 2021-06-03 DIAGNOSIS — R102 Pelvic and perineal pain: Secondary | ICD-10-CM

## 2021-06-03 LAB — I-STAT BETA HCG BLOOD, ED (MC, WL, AP ONLY): I-stat hCG, quantitative: <5 m[IU]/mL (ref <5)

## 2021-06-03 LAB — URINALYSIS, MICROSCOPIC (REFLEX)

## 2021-06-03 LAB — CBC
HCT: 40 % (ref 36.0–46.0)
Hemoglobin: 13.7 g/dL (ref 12.0–15.0)
MCH: 31.6 pg (ref 26.0–34.0)
MCHC: 34.3 g/dL (ref 30.0–36.0)
MCV: 92.2 fL (ref 80.0–100.0)
Platelets: 316 10*3/uL (ref 150–400)
RBC: 4.34 MIL/uL (ref 3.87–5.11)
RDW: 12.6 % (ref 11.5–15.5)
WBC: 4.9 10*3/uL (ref 4.0–10.5)
nRBC: 0 % (ref 0.0–0.2)

## 2021-06-03 LAB — POCT URINALYSIS DIPSTICK, ED / UC
Bilirubin Urine: NEGATIVE
Glucose, UA: NEGATIVE mg/dL
Ketones, ur: NEGATIVE mg/dL
Leukocytes,Ua: NEGATIVE
Nitrite: NEGATIVE
Protein, ur: NEGATIVE mg/dL
Specific Gravity, Urine: 1.01 (ref 1.005–1.030)
Urobilinogen, UA: 0.2 mg/dL (ref 0.0–1.0)
pH: 6 (ref 5.0–8.0)

## 2021-06-03 LAB — LIPASE, BLOOD: Lipase: 37 U/L (ref 11–51)

## 2021-06-03 LAB — COMPREHENSIVE METABOLIC PANEL
ALT: 17 U/L (ref 0–44)
AST: 22 U/L (ref 15–41)
Albumin: 3.5 g/dL (ref 3.5–5.0)
Alkaline Phosphatase: 61 U/L (ref 38–126)
Anion gap: 6 (ref 5–15)
BUN: 9 mg/dL (ref 6–20)
CO2: 26 mmol/L (ref 22–32)
Calcium: 8.8 mg/dL — ABNORMAL LOW (ref 8.9–10.3)
Chloride: 104 mmol/L (ref 98–111)
Creatinine, Ser: 0.73 mg/dL (ref 0.44–1.00)
GFR, Estimated: >60 mL/min (ref >60)
Glucose, Bld: 77 mg/dL (ref 70–99)
Potassium: 3.8 mmol/L (ref 3.5–5.1)
Sodium: 136 mmol/L (ref 135–145)
Total Bilirubin: 0.5 mg/dL (ref 0.3–1.2)
Total Protein: 7 g/dL (ref 6.5–8.1)

## 2021-06-03 LAB — URINALYSIS, ROUTINE W REFLEX MICROSCOPIC
Bilirubin Urine: NEGATIVE
Glucose, UA: NEGATIVE mg/dL
Ketones, ur: NEGATIVE mg/dL
Leukocytes,Ua: NEGATIVE
Nitrite: NEGATIVE
Protein, ur: NEGATIVE mg/dL
Specific Gravity, Urine: <1.005 — ABNORMAL LOW (ref 1.005–1.030)
pH: 6 (ref 5.0–8.0)

## 2021-06-03 NOTE — ED Provider Notes (Signed)
[facility]    CSN: [id number] Arrival date & time: 06/03/21  1002      History   Chief Complaint Chief Complaint  Patient presents with   Hematuria   Flank Pain    HPI Michaela Moran is a 40 y.o. female.   Patient here today for evaluation of right-sided flank pain, and hematuria that started a few days ago.  She reports that last night she had significant right-sided flank pain, and after Tylenol this did not improve.  She has not had any nausea or vomiting.  She denies any fever.  She was seen in the emergency department a few days ago and pregnancy test at that time was negative.  She states they were concerned for infection and did give her a few medications for same.   Hematuria Associated symptoms include abdominal pain (mild diffuse abd pain). Pertinent negatives include no shortness of breath.  Flank Pain Associated symptoms include abdominal pain (mild diffuse abd pain). Pertinent negatives include no shortness of breath.   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   Previous cesarean delivery, antepartum condition or complication 123XX123   Language barrier, cultural differences 04/28/2016   Advanced maternal age in multigravida     Past Surgical History:  Procedure Laterality Date   ARTERY BIOPSY Right 02/17/2021   Procedure: RIGHT TEMPORAL ARTERY BIOPSY;  Surgeon: Angelia Mould, MD;  Location: [facility];  Service: Vascular;  Laterality: Right;   [place] N/A 01/02/2013   Procedure: CESAREAN SECTION;  Surgeon:  Donnamae Jude, MD;  Location: [facility];  Service: Obstetrics;  Laterality: N/A;  Incidental Cyystotomy   CESAREAN SECTION N/A 06/26/2016   Procedure: CESAREAN SECTION;  Surgeon: Aletha Halim, MD;  Location: [facility];  Service: Obstetrics;  Laterality: N/A;   CYSTOTOMY     with repair at 2014 repeat c-section    OB History     Gravida  3   Para  3   Term  3   Preterm      AB      Living  3      SAB      IAB      Ectopic      Multiple  0   Live Births  3            Home Medications    Prior to Admission medications   Medication Sig Start Date End Date Taking? Authorizing Provider  acetaminophen (TYLENOL) 500 MG tablet Take 500 mg by mouth every 6 (six) hours as needed for mild pain.    [provider]  etonogestrel (NEXPLANON) 68 MG IMPL implant 1 each by Subdermal route once. Implanted October 2017    [provider]  fluconazole (DIFLUCAN) 150 MG tablet Take one tablet now and one tablet in 3 days 06/01/21   Leftwich-[name], Kathie Dike, CNM  fluticasone (FLONASE) 50 MCG/ACT nasal spray Place 1 spray into both nostrils daily. 01/30/21   [provider]  gabapentin (  NEURONTIN) 300 MG capsule Take 1 capsule (300 mg total) by mouth 3 (three) times daily. 08/30/20 09/29/20  Gildardo Pounds, NP  ibuprofen (ADVIL) 600 MG tablet Take 1 tablet (600 mg total) by mouth every 6 (six) hours as needed. 06/01/21   Leftwich-[name], Kathie Dike, CNM  nystatin cream (MYCOSTATIN) Apply to affected area 2 times daily 06/01/21   Leftwich-[name], Kathie Dike, CNM  oxyCODONE (ROXICODONE) 5 MG immediate release tablet Take 1 tablet (5 mg total) by mouth every 4 (four) hours as needed. 02/17/21   Angelia Mould, MD  pantoprazole (PROTONIX) 20 MG tablet Take 20 mg by mouth 2 (two) times daily. 01/30/21   [provider]  Prenatal Vit-Fe Fumarate-FA (PRENATAL VITAMINS) 28-0.8 MG TABS Take 1 tablet by mouth daily. 06/01/21   Leftwich-[name], Kathie Dike, CNM   promethazine-dextromethorphan (PROMETHAZINE-DM) 6.25-15 MG/5ML syrup Take 5 mLs by mouth 4 (four) times daily as needed for cough. 12/26/20   Pearson Forster, NP    Family History Family History  Problem Relation Age of Onset   Diabetes Mother    Diabetes Sister    Diabetes Brother    Hypertension Brother    Diabetes Sister    Diabetes Sister    Autism Son    Healthy Son    Healthy Daughter    Other Neg Hx     Social History Social History   Tobacco Use   Smoking status: Never   Smokeless tobacco: Never  Vaping Use   Vaping Use: Never used  Substance Use Topics   Alcohol use: No   Drug use: No     Allergies   Other   Review of Systems Review of Systems  Constitutional:  Negative for chills and fever.  Respiratory:  Negative for shortness of breath and wheezing.   Gastrointestinal:  Positive for abdominal pain (mild diffuse abd pain). Negative for nausea and vomiting.  Genitourinary:  Positive for flank pain and hematuria. Negative for dysuria and frequency.  Musculoskeletal:  Positive for back pain.    Physical Exam Triage Vital Signs ED Triage Vitals  Enc Vitals Group     BP 06/03/21 1018 92/60     Pulse Rate 06/03/21 1018 85     Resp 06/03/21 1018 20     Temp 06/03/21 1018 99.2 F (37.3 C)     Temp src --      SpO2 06/03/21 1018 94 %     Weight --      Height --      Head Circumference --      Peak Flow --      Pain Score 06/03/21 1016 8     Pain Loc --      Pain Edu? --      Excl. in [place]? --    No data found.  Updated Vital Signs BP 92/60   Pulse 85   Temp 99.2 F (37.3 C)   Resp 20   LMP 05/24/2021   SpO2 94%      Physical Exam Vitals and nursing note reviewed.  Constitutional:      General: She is not in acute distress.    Appearance: Normal appearance. She is not ill-appearing.  HENT:     Head: Normocephalic and atraumatic.     Nose: Nose normal.  Cardiovascular:     Rate and Rhythm: Normal rate and regular rhythm.     Heart  sounds: Normal heart sounds. No murmur heard. Pulmonary:     Effort: Pulmonary  effort is normal. No respiratory distress.     Breath sounds: Normal breath sounds. No wheezing, rhonchi or rales.  Abdominal:     General: Abdomen is flat. Bowel sounds are normal. There is no distension.     Palpations: Abdomen is soft.     Tenderness: There is no abdominal tenderness. There is no right CVA tenderness, left CVA tenderness or guarding.  Skin:    General: Skin is warm and dry.  Neurological:     Mental Status: She is alert.  Psychiatric:        Mood and Affect: Mood normal.        Thought Content: Thought content normal.     UC Treatments / Results  Labs (all labs ordered are listed, but only abnormal results are displayed) Labs Reviewed  POCT URINALYSIS DIPSTICK, ED / UC - Abnormal; Notable for the following components:      Result Value   Hgb urine dipstick SMALL (*)    All other components within normal limits    EKG   Radiology No results found.  Procedures Procedures (including critical care time)  Medications Ordered in UC Medications - No data to display  Initial Impression / Assessment and Plan / UC Course  I have reviewed the triage vital signs and the nursing notes.  Pertinent labs & imaging results that were available during my care of the patient were reviewed by me and considered in my medical decision making (see chart for details).  Discussed that symptoms may be related to kidney stone.  Advised the only way to know with certainty would be CT of abdomen pelvis, and discussed watchful waiting versus being seen in the emergency department for further evaluation.  Patient reports she would like to be seen now for symptoms.  Discharge instructions given to report to ED.  Discussed recommendations with husband via telephone.  Husband and wife expressed understanding.  Will order urine culture to rule out infection definitively.  Final Clinical Impressions(s) / UC  Diagnoses   Final diagnoses:  Right flank pain  Hematuria, unspecified type     Discharge Instructions      Please report to emergency department for further evaluation of possible kidney stone.      ED Prescriptions   None    PDMP not reviewed this encounter.   Francene Finders, PA-C 06/03/21 1048

## 2021-06-03 NOTE — ED Triage Notes (Signed)
C/o bilateral flank pain that radiates around to lower abd since Wednesday with hematuria.  Sent from [facility].  Seen by OB-GYN 9/15 for pelvic pain and vaginal bleeding.

## 2021-06-03 NOTE — ED Provider Notes (Signed)
[place] EMERGENCY DEPARTMENT Provider Note   CSN: [id number] Arrival date & time: 06/03/21  1053     History No chief complaint on file.   Michaela Moran is a 40 y.o. female.  40 yo female presenting for flank pain. Pt admits to bilateral flank pain that started last week, radiating to lower abdomen on both sides and suprabupically, described as dull ache, intermittent, and associated with hematuria. Pt also admits to vaginal bleeding outside her normal menstrual cycle. Followed with OBGYN two days ago without imaging.  The history is provided by the patient. No language interpreter was used.      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   Previous cesarean delivery, antepartum condition or complication 123XX123   Language barrier, cultural differences 04/28/2016   Advanced maternal age in multigravida     Past Surgical History:  Procedure Laterality Date   ARTERY BIOPSY Right 02/17/2021   Procedure: RIGHT TEMPORAL ARTERY BIOPSY;  Surgeon: Angelia Mould, MD;  Location: [place];  Service: Vascular;  Laterality: Right;   [place] N/A 01/02/2013   Procedure: CESAREAN SECTION;  Surgeon: Donnamae Jude, MD;  Location: [facility];  Service: Obstetrics;  Laterality: N/A;  Incidental Cyystotomy   CESAREAN SECTION N/A 06/26/2016   Procedure: CESAREAN SECTION;  Surgeon: Aletha Halim, MD;  Location: [place];  Service: Obstetrics;  Laterality: N/A;   CYSTOTOMY     with repair at 2014 repeat c-section     OB History      Gravida  3   Para  3   Term  3   Preterm      AB      Living  3      SAB      IAB      Ectopic      Multiple  0   Live Births  3           Family History  Problem Relation Age of Onset   Diabetes Mother    Diabetes Sister    Diabetes Brother    Hypertension Brother    Diabetes Sister    Diabetes Sister    Autism Son    Healthy Son    Healthy Daughter    Other Neg Hx     Social History   Tobacco Use   Smoking status: Never   Smokeless tobacco: Never  Vaping Use   Vaping Use: Never used  Substance Use Topics   Alcohol use: No   Drug use: No    Home Medications Prior to Admission medications   Medication Sig Start Date End Date Taking? Authorizing Provider  acetaminophen (TYLENOL) 500 MG tablet Take 500 mg by mouth every 6 (six) hours as needed for mild pain.    [provider]  etonogestrel (NEXPLANON) 68 MG IMPL implant 1 each by Subdermal route once. Implanted October 2017    [provider]  fluconazole (DIFLUCAN) 150 MG tablet Take one tablet now and  one tablet in 3 days 06/01/21   Leftwich-[name], Kathie Dike, CNM  fluticasone (FLONASE) 50 MCG/ACT nasal spray Place 1 spray into both nostrils daily. 01/30/21   [provider]  gabapentin (NEURONTIN) 300 MG capsule Take 1 capsule (300 mg total) by mouth 3 (three) times daily. 08/30/20 09/29/20  Gildardo Pounds, NP  ibuprofen (ADVIL) 600 MG tablet Take 1 tablet (600 mg total) by mouth every 6 (six) hours as needed. 06/01/21   Leftwich-[name], Kathie Dike, CNM  nystatin cream (MYCOSTATIN) Apply to affected area 2 times daily 06/01/21   Leftwich-[name], Kathie Dike, CNM  oxyCODONE (ROXICODONE) 5 MG immediate release tablet Take 1 tablet (5 mg total) by mouth every 4 (four) hours as needed. 02/17/21   Angelia Mould, MD  pantoprazole (PROTONIX) 20 MG tablet Take 20 mg by mouth 2 (two) times daily. 01/30/21   [provider]  Prenatal Vit-Fe Fumarate-FA (PRENATAL VITAMINS) 28-0.8  MG TABS Take 1 tablet by mouth daily. 06/01/21   Leftwich-[name], Kathie Dike, CNM  promethazine-dextromethorphan (PROMETHAZINE-DM) 6.25-15 MG/5ML syrup Take 5 mLs by mouth 4 (four) times daily as needed for cough. 12/26/20   Pearson Forster, NP    Allergies    Other  Review of Systems   Review of Systems  Constitutional:  Negative for chills and fever.  HENT:  Negative for ear pain and sore throat.   Eyes:  Negative for pain and visual disturbance.  Respiratory:  Negative for cough and shortness of breath.   Cardiovascular:  Negative for chest pain and palpitations.  Gastrointestinal:  Positive for abdominal pain. Negative for vomiting.  Genitourinary:  Positive for flank pain, hematuria and vaginal bleeding. Negative for dysuria.  Musculoskeletal:  Negative for arthralgias and back pain.  Skin:  Negative for color change and rash.  Neurological:  Negative for seizures and syncope.  All other systems reviewed and are negative.  Physical Exam Updated Vital Signs BP 116/82 (BP Location: Left Arm)   Pulse 78   Temp 98.4 F (36.9 C) (Oral)   Resp 16   LMP 05/24/2021   SpO2 99%   Physical Exam Vitals and nursing note reviewed.  Constitutional:      General: She is not in acute distress.    Appearance: She is well-developed.  HENT:     Head: Normocephalic and atraumatic.  Eyes:     Conjunctiva/sclera: Conjunctivae normal.  Cardiovascular:     Rate and Rhythm: Normal rate and regular rhythm.     Heart sounds: No murmur heard. Pulmonary:     Effort: Pulmonary effort is normal. No respiratory distress.     Breath sounds: Normal breath sounds.  Abdominal:     Palpations: Abdomen is soft.     Tenderness: There is abdominal tenderness in the suprapubic area, left upper quadrant and left lower quadrant. There is no guarding or rebound.  Musculoskeletal:     Cervical back: Neck supple.  Skin:    General: Skin is warm and dry.  Neurological:     Mental Status: She is alert.    ED  Results / Procedures / Treatments   Labs (all labs ordered are listed, but only abnormal results are displayed) Labs Reviewed  LIPASE, BLOOD  COMPREHENSIVE METABOLIC PANEL  CBC  URINALYSIS, ROUTINE W REFLEX MICROSCOPIC  I-STAT BETA HCG BLOOD, ED ([facility], [facility], [facility] ONLY)    EKG None  Radiology No results found.  Procedures Procedures   Medications Ordered in ED Medications - No data to display  ED  Course  I have reviewed the triage vital signs and the nursing notes.  Pertinent labs & imaging results that were available during my care of the patient were reviewed by me and considered in my medical decision making (see chart for details).  Clinical Course as of 06/04/21 1416  Sat Jun 03, 2021  1546 Glucose: 77 [AG]    Clinical Course User Index [AG] Lianne Cure, DO   MDM Rules/Calculators/A&P  11:44 AM 40 yo female presenting for lower abdominal pain, bilateral flank pain, and hematuria. Pt is Aox3, no acute distress, afebrile, with stable vitals. Physical exam demonstrates soft abdomen with tenderness to palpation of suprapubic region.   Hematuria/flank pain and vaginal bleeding: -Pt offered pain medication and declined at this time. -stable hemoglobin -UA positive for hematuria. No uti -CT abdomen demonstrates no renal stones -Pelvic US positive for uterine fibroids.  Patient in no distress and overall condition improved here in the ED. Vaginal bleeding outside or normal menstrual cycle as well as cramping possibly do to uterine fibroids.  Detailed discussions were had with the patient regarding current findings, and need for close f/u with OBGYN. Hematuria on UA likely reminence of vaginal bleeding. Recommendations for follow up with pcp if symptoms worsen/continue. The patient has been instructed to return immediately if the symptoms worsen in any way for re-evaluation. Patient verbalized understanding and is in agreement with current care plan. All questions answered  prior to discharge.      Final Clinical Impression(s) / ED Diagnoses Final diagnoses:  Suprapubic abdominal pain  Uterine leiomyoma, unspecified location  Abnormal menstrual cycle    Rx / DC Orders ED Discharge Orders     None        Lianne Cure, DO [id number]

## 2021-06-03 NOTE — Discharge Instructions (Signed)
Please report to emergency department for further evaluation of possible kidney stone.

## 2021-06-03 NOTE — ED Triage Notes (Signed)
Pt reports seeing blood in urine this morning. Py also point to flank area for pain site

## 2021-06-04 ENCOUNTER — Telehealth: Payer: Self-pay

## 2021-06-04 NOTE — Telephone Encounter (Signed)
Transition Care Management Unsuccessful Follow-up Telephone Call  Date of discharge and from where:  06/03/2021 from [facility]  Attempts:  1st Attempt  Reason for unsuccessful TCM follow-up call:  Left voice message

## 2021-06-06 ENCOUNTER — Other Ambulatory Visit: Payer: Self-pay

## 2021-06-06 ENCOUNTER — Ambulatory Visit (INDEPENDENT_AMBULATORY_CARE_PROVIDER_SITE_OTHER): Payer: Medicaid Other | Admitting: Advanced Practice Midwife

## 2021-06-06 VITALS — BP 108/79 | HR 77 | Wt 196.4 lb

## 2021-06-06 DIAGNOSIS — N939 Abnormal uterine and vaginal bleeding, unspecified: Secondary | ICD-10-CM | POA: Diagnosis not present

## 2021-06-06 DIAGNOSIS — R102 Pelvic and perineal pain: Secondary | ICD-10-CM

## 2021-06-06 DIAGNOSIS — D251 Intramural leiomyoma of uterus: Secondary | ICD-10-CM

## 2021-06-06 DIAGNOSIS — D250 Submucous leiomyoma of uterus: Secondary | ICD-10-CM | POA: Diagnosis not present

## 2021-06-06 DIAGNOSIS — O34219 Maternal care for unspecified type scar from previous cesarean delivery: Secondary | ICD-10-CM | POA: Diagnosis not present

## 2021-06-06 HISTORY — DX: Submucous leiomyoma of uterus: D25.1

## 2021-06-06 NOTE — Telephone Encounter (Signed)
Transition Care Management Unsuccessful Follow-up Telephone Call  Date of discharge and from where:  06/03/2021 from [facility]  Attempts:  2nd Attempt  Reason for unsuccessful TCM follow-up call:  Left voice message

## 2021-06-06 NOTE — Progress Notes (Signed)
     GYNECOLOGY PROGRESS NOTE  History:  Michaela Moran is a 40 y.o. female here at [facility] for a follow up visit after MAU/ED visit. She presented to MAU on 06/01/21 with vaginal itching and abdominal pain and was triaged and medications sent to pharmacy to treat yeast infection, but not evaluated further since pt was not pregnant.  She then presented to [facility] on 06/03/21 with flank and abdominal pain and was sent to the ED.  Imaging did not find renal or other abdominal abnormalities but did find uterine fibroids.  Current complaints: pain is resolved but she continues to have light menstrual bleeding.  Personal health questionnaire reviewed: yes.  The following portions of the patient's history were reviewed and updated as appropriate: allergies, current medications, past family history, past medical history, past social history, past surgical history and problem list. Last pap smear on 05/11/2019 was normal, negative HRHPV.  Health Maintenance Due  Topic Date Due   FOOT EXAM  Never done   OPHTHALMOLOGY EXAM  Never done   URINE MICROALBUMIN  Never done   COVID-19 Vaccine (3 - Booster for Pfizer series) 07/18/2020   HEMOGLOBIN A1C  11/03/2020   INFLUENZA VACCINE  04/17/2021     Review of Systems:  Pertinent items are noted in HPI.   Objective:  Physical Exam Blood pressure 108/79, pulse 77, weight 196 lb 6.4 oz (89.1 kg), last menstrual period 05/24/2021, currently breastfeeding. VS reviewed, nursing note reviewed,  Constitutional: well developed, well nourished, no distress HEENT: normocephalic CV: normal rate Pulm/chest wall: normal effort Breast Exam: deferred Abdomen: soft Neuro: alert and oriented x 3 Skin: warm, dry Psych: affect normal Pelvic exam: Deferred  Assessment & Plan:  1. Intramural and submucous leiomyoma of uterus --TVUS in ED on 06/03/21 reveals fibroids --Discussed results and [place] interpreter used at pt request to explain and use medical terms.  Otherwise pt has not required interpreter.   2. Abnormal uterine bleeding (AUB) --AUB is main symptoms of fibroids, as pain has now resolved.  --Discussed options including expectant management, hormones used to reduce bleeding, or IUD or surgical options --Pt would like to try 1 month of OCPs, then stop and see what bleeding/pain are like --She desires another pregnancy --Lo loestrin sample given from office today, lowest estrogen dose for short course --Pt without risk factors other than age --Warning signs/reasons to see care reviewed --F/U with me in 3 months --Pap at next visit?  3. Pelvic pain in female --Likely from uterine fibroids, see above  Fatima Blank, CNM 3:58 PM

## 2021-06-06 NOTE — Progress Notes (Signed)
Pt is in the office reporting irregular bleeding and pelvic pain. Pt reports that she was seen at ED on 06-03-21 and had an U/S performed, pt needs results today.

## 2021-06-08 NOTE — Telephone Encounter (Signed)
Transition Care Management Unsuccessful Follow-up Telephone Call  Date of discharge and from where:  06/03/2021 from [facility]  Attempts:  3rd Attempt  Reason for unsuccessful TCM follow-up call:  Unable to reach patient

## 2021-06-14 ENCOUNTER — Ambulatory Visit: Payer: Self-pay | Admitting: *Deleted

## 2021-06-14 ENCOUNTER — Ambulatory Visit: Payer: Medicaid Other | Attending: Physician Assistant | Admitting: Physician Assistant

## 2021-06-14 ENCOUNTER — Other Ambulatory Visit: Payer: Self-pay | Admitting: Nurse Practitioner

## 2021-06-14 ENCOUNTER — Other Ambulatory Visit: Payer: Self-pay

## 2021-06-14 ENCOUNTER — Encounter: Payer: Self-pay | Admitting: Physician Assistant

## 2021-06-14 VITALS — BP 112/80 | HR 74 | Resp 16 | Wt 203.4 lb

## 2021-06-14 DIAGNOSIS — L02411 Cutaneous abscess of right axilla: Secondary | ICD-10-CM | POA: Insufficient documentation

## 2021-06-14 DIAGNOSIS — Z1231 Encounter for screening mammogram for malignant neoplasm of breast: Secondary | ICD-10-CM

## 2021-06-14 DIAGNOSIS — Z603 Acculturation difficulty: Secondary | ICD-10-CM | POA: Diagnosis not present

## 2021-06-14 DIAGNOSIS — Z79899 Other long term (current) drug therapy: Secondary | ICD-10-CM | POA: Insufficient documentation

## 2021-06-14 MED ORDER — MUPIROCIN 2 % EX OINT
1.0000 "application " | TOPICAL_OINTMENT | Freq: Two times a day (BID) | CUTANEOUS | 0 refills | Status: DC
  Filled 2021-06-14: qty 22, 11d supply, fill #0

## 2021-06-14 MED ORDER — DOXYCYCLINE HYCLATE 100 MG PO TABS
100.0000 mg | ORAL_TABLET | Freq: Two times a day (BID) | ORAL | 0 refills | Status: DC
  Filled 2021-06-14: qty 20, 10d supply, fill #0

## 2021-06-14 NOTE — Telephone Encounter (Signed)
One bump at the right armpit. Red and growing since Saturday. Painful to touch. No fever reported.  Scheduled appointment.    Reason for Disposition  SEVERE pain (e.g., excruciating)  Answer Assessment - Initial Assessment Questions 1. APPEARANCE of BOIL: "What does the boil look like?"      Round and growing since Saturday 2. LOCATION: "Where is the boil located?"      Right armpit 3. NUMBER: "How many boils are there?"      one 4. SIZE: "How big is the boil?" (e.g., inches, cm; compare to size of a coin or other object)     fingertip 5. ONSET: "When did the boil start?"     saturday 6. PAIN: "Is there any pain?" If Yes, ask: "How bad is the pain?"   (Scale 1-10; or mild, moderate, severe)     Yes, moderate 7. FEVER: "Do you have a fever?" If Yes, ask: "What is it, how was it measured, and when did it start?"      no 8. SOURCE: "Have you been around anyone with boils or other Staph infections?" "Have you ever had boils before?"     no 9. OTHER SYMPTOMS: "Do you have any other symptoms?" (e.g., shaking chills, weakness, rash elsewhere on body)     no 10. PREGNANCY: "Is there any chance you are pregnant?" "When was your last menstrual period?"       na  Protocols used: Boil (Skin Abscess)-A-AH

## 2021-06-14 NOTE — Telephone Encounter (Signed)
The patient would like to be contacted regarding bumps in their right underarm   The bumps are causing significant discomfort   The bumps in the underarm have causing discomfort for roughly two days   Please contact further when available   Called patient to review symptoms. Called interpreter line and no available [place] interpreter at this time. Called patient again without interpreter, No answer, left message on voicemail to call clinic back.

## 2021-06-14 NOTE — Telephone Encounter (Signed)
Pt was seen 9/28

## 2021-06-14 NOTE — Progress Notes (Signed)
Patient ID: Michaela Moran, female   DOB: 1981/03/23, 40 y.o.   MRN: [id number]     Michaela Moran, is a 40 y.o. female  ACZ:[id number]  [id number]:[id number]  DOB - 19-Sep-1980  Chief Complaint  Patient presents with   Cyst       Subjective:   Michaela Moran is a 40 y.o. female here today for tender area under R arm that is getting larger and more tender.  No fever.  Last mammogram was 06/2020 and normal.  She first noticed the area under her R arm about 6 days ago.    No problems updated.  ALLERGIES: Allergies  Allergen Reactions   Other Swelling    Allergic to peas.    PAST MEDICAL HISTORY: Past Medical History:  Diagnosis Date   Acid reflux    Female circumcision    Hemorrhoid    History of positive PPD    neg CXR   Hypercholesteremia     MEDICATIONS AT HOME: Prior to Admission medications   Medication Sig Start Date End Date Taking? Authorizing Provider  doxycycline (VIBRA-TABS) 100 MG tablet Take 1 tablet (100 mg total) by mouth 2 (two) times daily. 06/14/21  Yes Argentina Donovan, PA-C  mupirocin ointment (BACTROBAN) 2 % Apply 1 application topically 2 (two) times daily. 06/14/21  Yes Argentina Donovan, PA-C  acetaminophen (TYLENOL) 500 MG tablet Take 500 mg by mouth every 6 (six) hours as needed for mild pain. Patient not taking: Reported on 06/06/2021    [provider]  etonogestrel (NEXPLANON) 68 MG IMPL implant 1 each by Subdermal route once. Implanted October 2017 Patient not taking: Reported on 06/06/2021    [provider]  fluconazole (DIFLUCAN) 150 MG tablet Take one tablet now and one tablet in 3 days Patient not taking: Reported on 06/06/2021 06/01/21   Leftwich-[name], Kathie Dike, CNM  fluticasone (FLONASE) 50 MCG/ACT nasal spray Place 1 spray into both nostrils daily. Patient not taking: Reported on 06/06/2021 01/30/21   [provider]  gabapentin (NEURONTIN) 300 MG capsule Take 1 capsule (300 mg total) by mouth 3 (three) times daily. 08/30/20 09/29/20   Gildardo Pounds, NP  ibuprofen (ADVIL) 600 MG tablet Take 1 tablet (600 mg total) by mouth every 6 (six) hours as needed. Patient not taking: Reported on 06/06/2021 06/01/21   Fatima Blank A, CNM  nystatin cream (MYCOSTATIN) Apply to affected area 2 times daily Patient not taking: Reported on 06/06/2021 06/01/21   Elvera Maria, CNM  oxyCODONE (ROXICODONE) 5 MG immediate release tablet Take 1 tablet (5 mg total) by mouth every 4 (four) hours as needed. Patient not taking: Reported on 06/06/2021 02/17/21   Angelia Mould, MD  pantoprazole (PROTONIX) 20 MG tablet Take 20 mg by mouth 2 (two) times daily. Patient not taking: Reported on 06/06/2021 01/30/21   [provider]  Prenatal Vit-Fe Fumarate-FA (PRENATAL VITAMINS) 28-0.8 MG TABS Take 1 tablet by mouth daily. Patient not taking: Reported on 06/06/2021 06/01/21   Elvera Maria, CNM  promethazine-dextromethorphan (PROMETHAZINE-DM) 6.25-15 MG/5ML syrup Take 5 mLs by mouth 4 (four) times daily as needed for cough. Patient not taking: Reported on 06/06/2021 12/26/20   Pearson Forster, NP    ROS: Neg HEENT Neg resp Neg cardiac Neg GI Neg GU Neg MS Neg psych Neg neuro  Objective:   Vitals:   06/14/21 1403  BP: 112/80  Pulse: 74  Resp: 16  SpO2: 100%  Weight: 203 lb 6.4 oz (92.3 kg)  Exam General appearance : Awake, alert, not in any distress. Speech Clear. Not toxic looking HEENT: Atraumatic and Normocephalic Neck: Supple, no JVD. No cervical lymphadenopathy.  Chest: Good air entry bilaterally, CTAB.  No rales/rhonchi/wheezing CVS: S1 S2 regular, no murmurs.  1-2 cm freely mobile and tender boil in R axilla.  No mass/lump in either breast or L axilla Extremities: B/L Lower Ext shows no edema, both legs are warm to touch Neurology: Awake alert, and oriented X 3, CN II-XII intact, Non focal Skin: No Rash  Data Review Lab Results  Component Value Date   HGBA1C 5.7 (H) 05/03/2020   HGBA1C 5.4  10/02/2013    Assessment & Plan   1. Abscess of right axilla - doxycycline (VIBRA-TABS) 100 MG tablet; Take 1 tablet (100 mg total) by mouth 2 (two) times daily.  Dispense: 20 tablet; Refill: 0 - mupirocin ointment (BACTROBAN) 2 %; Apply 1 application topically 2 (two) times daily.  Dispense: 22 g; Refill: 0 - MM Digital Screening; Future -u/s R axilla  2. Language barrier-[employer] interpreters used and additional time performing visit was required.   Patient have been counseled extensively about nutrition and exercise. Other issues discussed during this visit include: low cholesterol diet, weight control and daily exercise, foot care, annual eye examinations at Ophthalmology, importance of adherence with medications and regular follow-up. We also discussed long term complications of uncontrolled diabetes and hypertension.   Return for keep appt with Geryl Rankins for 10/31.  The patient was given clear instructions to go to ER or return to medical center if symptoms don't improve, worsen or new problems develop. The patient verbalized understanding. The patient was told to call to get lab results if they haven't heard anything in the next week.      Freeman Caldron, PA-C [facility] and [place], [place]   06/14/2021, 2:13 PM

## 2021-06-14 NOTE — Patient Instructions (Signed)
Skin Abscess  A skin abscess is an infected area on or under your skin that contains a collection of pus and other material. An abscess may also be called a furuncle,carbuncle, or boil. An abscess can occur in or on almost any part of your body. Some abscesses break open (rupture) on their own. Most continue to get worse unless they are treated. The infection can spread deeper into the body and eventually into your blood, whichcan make you feel ill. Treatment usually involves draining the abscess. What are the causes? An abscess occurs when germs, like bacteria, pass through your skin and cause an infection. This may be caused by: A scrape or cut on your skin. A puncture wound through your skin, including a needle injection or insect bite. Blocked oil or sweat glands. Blocked and infected hair follicles. A cyst that forms beneath your skin (sebaceous cyst) and becomes infected. What increases the risk? This condition is more likely to develop in people who: Have a weak body defense system (immune system). Have diabetes. Have dry and irritated skin. Get frequent injections or use illegal IV drugs. Have a foreign body in a wound, such as a splinter. Have problems with their lymph system or veins. What are the signs or symptoms? Symptoms of this condition include: A painful, firm bump under the skin. A bump with pus at the top. This may break through the skin and drain. Other symptoms include: Redness surrounding the abscess site. Warmth. Swelling of the lymph nodes (glands) near the abscess. Tenderness. A sore on the skin. How is this diagnosed? This condition may be diagnosed based on: A physical exam. Your medical history. A sample of pus. This may be used to find out what is causing the infection. Blood tests. Imaging tests, such as an ultrasound, CT scan, or MRI. How is this treated? A small abscess that drains on its own may not need treatment. Treatment for larger abscesses  may include: Moist heat or heat pack applied to the area several times a day. A procedure to drain the abscess (incision and drainage). Antibiotic medicines. For a severe abscess, you may first get antibiotics through an IV and then change to antibiotics by mouth. Follow these instructions at home: Medicines  Take over-the-counter and prescription medicines only as told by your health care provider. If you were prescribed an antibiotic medicine, take it as told by your health care provider. Do not stop taking the antibiotic even if you start to feel better.  Abscess care  If you have an abscess that has not drained, apply heat to the affected area. Use the heat source that your health care provider recommends, such as a moist heat pack or a heating pad. Place a towel between your skin and the heat source. Leave the heat on for 20-30 minutes. Remove the heat if your skin turns bright red. This is especially important if you are unable to feel pain, heat, or cold. You may have a greater risk of getting burned. Follow instructions from your health care provider about how to take care of your abscess. Make sure you: Cover the abscess with a bandage (dressing). Change your dressing or gauze as told by your health care provider. Wash your hands with soap and water before you change the dressing or gauze. If soap and water are not available, use hand sanitizer. Check your abscess every day for signs of a worsening infection. Check for: More redness, swelling, or pain. More fluid or blood. Warmth. More   pus or a bad smell.  General instructions To avoid spreading the infection: Do not share personal care items, towels, or hot tubs with others. Avoid making skin contact with other people. Keep all follow-up visits as told by your health care provider. This is important. Contact a health care provider if you have: More redness, swelling, or pain around your abscess. More fluid or blood coming  from your abscess. Warm skin around your abscess. More pus or a bad smell coming from your abscess. A fever. Muscle aches. Chills or a general ill feeling. Get help right away if you: Have severe pain. See red streaks on your skin spreading away from the abscess. Summary A skin abscess is an infected area on or under your skin that contains a collection of pus and other material. A small abscess that drains on its own may not need treatment. Treatment for larger abscesses may include having a procedure to drain the abscess and taking an antibiotic. This information is not intended to replace advice given to you by your health care provider. Make sure you discuss any questions you have with your healthcare provider. Document Revised: 12/25/2018 Document Reviewed: 10/17/2017 Elsevier Patient Education  2022 Elsevier Inc.  

## 2021-07-12 ENCOUNTER — Other Ambulatory Visit: Payer: Self-pay

## 2021-07-12 ENCOUNTER — Ambulatory Visit
Admission: RE | Admit: 2021-07-12 | Discharge: 2021-07-12 | Disposition: A | Discharge status: Home / Self Care | Payer: Medicaid Other | Source: Ambulatory Visit | Attending: Nurse Practitioner | Admitting: Nurse Practitioner

## 2021-07-12 DIAGNOSIS — Z1231 Encounter for screening mammogram for malignant neoplasm of breast: Secondary | ICD-10-CM

## 2021-07-17 ENCOUNTER — Ambulatory Visit: Payer: Medicaid Other | Admitting: Nurse Practitioner

## 2021-07-19 ENCOUNTER — Ambulatory Visit: Payer: Medicaid Other

## 2021-07-20 ENCOUNTER — Ambulatory Visit: Payer: Medicaid Other | Admitting: Physician Assistant

## 2021-07-20 ENCOUNTER — Ambulatory Visit: Payer: Self-pay

## 2021-07-20 NOTE — Telephone Encounter (Signed)
Using [employer] [id number]. Patient called and says she wants to change her appointment from today to tomorrow due to transportation issues. Initial appointment notes says abdominal pain, blood in urine. Patient denies blood in the urine, says that was 1 month ago. She says now she's having urine frequency and right side pain, pain also with urination. She says her urine is yellow in color, no odor, no blood seen. Pain is a 5. I advised no available appointments tomorrow in the office with any provider, first available is on Monday. She says she will take the Monday after 1 pm appointment. Appointment scheduled for Monday 07/24/21 at 12 with Carrolyn Meiers, PA, care advice given, patient verbalized understanding.    Reason for Disposition  Urinating more frequently than usual (i.e., frequency)  Answer Assessment - Initial Assessment Questions 1. SYMPTOM: "What's the main symptom you're concerned about?" (e.g., frequency, incontinence)     Frequency, pain on right side 2. ONSET: "When did the symptoms start?"     Yesterday and today 3. PAIN: "Is there any pain?" If Yes, ask: "How bad is it?" (Scale: 1-10; mild, moderate, severe)     5 4. CAUSE: "What do you think is causing the symptoms?"     UTI 5. OTHER SYMPTOMS: "Do you have any other symptoms?" (e.g., fever, flank pain, blood in urine, pain with urination)     Right flank pain, no blood at this time, pain with urination 6. PREGNANCY: "Is there any chance you are pregnant?" "When was your last menstrual period?"     No  Protocols used: Urinary Symptoms-A-AH

## 2021-07-22 ENCOUNTER — Ambulatory Visit (HOSPITAL_COMMUNITY)
Admission: EM | Admit: 2021-07-22 | Discharge: 2021-07-22 | Disposition: A | Discharge status: Home / Self Care | Payer: Medicaid Other | Attending: Student | Admitting: Student

## 2021-07-22 ENCOUNTER — Encounter (HOSPITAL_COMMUNITY): Payer: Self-pay

## 2021-07-22 ENCOUNTER — Other Ambulatory Visit: Payer: Self-pay

## 2021-07-22 DIAGNOSIS — Z20828 Contact with and (suspected) exposure to other viral communicable diseases: Secondary | ICD-10-CM | POA: Diagnosis not present

## 2021-07-22 DIAGNOSIS — R0989 Other specified symptoms and signs involving the circulatory and respiratory systems: Secondary | ICD-10-CM | POA: Insufficient documentation

## 2021-07-22 DIAGNOSIS — R350 Frequency of micturition: Secondary | ICD-10-CM

## 2021-07-22 DIAGNOSIS — R059 Cough, unspecified: Secondary | ICD-10-CM | POA: Diagnosis not present

## 2021-07-22 DIAGNOSIS — R0981 Nasal congestion: Secondary | ICD-10-CM | POA: Diagnosis not present

## 2021-07-22 DIAGNOSIS — Z1152 Encounter for screening for COVID-19: Secondary | ICD-10-CM

## 2021-07-22 LAB — POCT URINALYSIS DIPSTICK, ED / UC
Bilirubin Urine: NEGATIVE
Glucose, UA: NEGATIVE mg/dL
Ketones, ur: NEGATIVE mg/dL
Leukocytes,Ua: NEGATIVE
Nitrite: NEGATIVE
Protein, ur: NEGATIVE mg/dL
Specific Gravity, Urine: <=1.005 (ref 1.005–1.030)
Urobilinogen, UA: 0.2 mg/dL (ref 0.0–1.0)
pH: 5.5 (ref 5.0–8.0)

## 2021-07-22 LAB — POC URINE PREG, ED: Preg Test, Ur: NEGATIVE

## 2021-07-22 LAB — POC INFLUENZA A AND B ANTIGEN (URGENT CARE ONLY)
INFLUENZA A ANTIGEN, POC: NEGATIVE
INFLUENZA B ANTIGEN, POC: NEGATIVE

## 2021-07-22 MED ORDER — PROMETHAZINE-DM 6.25-15 MG/5ML PO SYRP: 5.0000 mL | ORAL_SOLUTION | Freq: Four times a day (QID) | ORAL | 0 refills | Status: DC | PRN

## 2021-07-22 NOTE — Discharge Instructions (Addendum)
-  Promethazine DM cough syrup for congestion/cough. This could make you drowsy, so take at night before bed. -With a virus, you're typically contagious for 5-7 days, or as long as you're having fevers.

## 2021-07-22 NOTE — ED Triage Notes (Signed)
Pt reports cough, headache and fever x 4 days. States she was in contact with a family member with flu.  Pt reports increased urinary frequency and lower abdominal pressure x 3 days.

## 2021-07-22 NOTE — ED Provider Notes (Signed)
[facility]    CSN: [id number] Arrival date & time: 07/22/21  1212      History   Chief Complaint Chief Complaint  Patient presents with   Fever   Cough   Headache    HPI Michaela Moran is a 40 y.o. female presenting with viral syndrome following exposure to flu.  Medical history pelvic adhesive disease, female circumcision.  Describes 4 days of nonproductive cough, headaches, body aches, fevers following exposure to the flu.  Has not monitored temperature at home.  Has not taken medications for the symptoms.  Symptoms are overall improving but the cough is keeping her up at night.  Also with intermittent crampy lower abdominal pain right worse than left, urinary frequency, external vaginal itching and irritation.  Denies flank pain, fever/chills, external vaginal discharge.  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 04/28/2016   Previous cesarean delivery, antepartum condition or complication 32/99/2426   Language barrier, cultural differences 04/28/2016    Past Surgical History:  Procedure Laterality Date   ARTERY BIOPSY Right 02/17/2021   Procedure: RIGHT TEMPORAL ARTERY BIOPSY;  Surgeon: Angelia Mould, MD;  Location: [facility];  Service: Vascular;  Laterality: Right;   [place] N/A 01/02/2013   Procedure: CESAREAN SECTION;  Surgeon: Donnamae Jude, MD;  Location: [facility];  Service: Obstetrics;  Laterality: N/A;  Incidental Cyystotomy   CESAREAN SECTION N/A 06/26/2016   Procedure: CESAREAN  SECTION;  Surgeon: Aletha Halim, MD;  Location: [facility];  Service: Obstetrics;  Laterality: N/A;   CYSTOTOMY     with repair at 2014 repeat c-section    OB History     Gravida  3   Para  3   Term  3   Preterm      AB      Living  3      SAB      IAB      Ectopic      Multiple  0   Live Births  3            Home Medications    Prior to Admission medications   Medication Sig Start Date End Date Taking? Authorizing Provider  promethazine-dextromethorphan (PROMETHAZINE-DM) 6.25-15 MG/5ML syrup Take 5 mLs by mouth 4 (four) times daily as needed for cough. 07/22/21  Yes Hazel Sams, PA-C  acetaminophen (TYLENOL) 500 MG tablet Take 500 mg by mouth every 6 (six) hours as needed for mild pain. Patient not taking: No sig reported    [provider]  doxycycline (VIBRA-TABS) 100 MG tablet Take 1 tablet (100 mg total) by mouth 2 (two) times daily. 06/14/21   Argentina Donovan, PA-C  etonogestrel (NEXPLANON) 68 MG IMPL implant 1 each by Subdermal route once. Implanted October 2017 Patient not taking: No sig reported    [provider]  fluconazole (DIFLUCAN) 150 MG tablet Take one tablet now and one tablet in 3 days Patient not  taking: No sig reported 06/01/21   Leftwich-[name], Kathie Dike, CNM  fluticasone (FLONASE) 50 MCG/ACT nasal spray Place 1 spray into both nostrils daily. Patient not taking: No sig reported 01/30/21   [provider]  gabapentin (NEURONTIN) 300 MG capsule Take 1 capsule (300 mg total) by mouth 3 (three) times daily. 08/30/20 09/29/20  Gildardo Pounds, NP  ibuprofen (ADVIL) 600 MG tablet Take 1 tablet (600 mg total) by mouth every 6 (six) hours as needed. Patient not taking: No sig reported 06/01/21   Leftwich-[name], Kathie Dike, CNM  mupirocin ointment (BACTROBAN) 2 % Apply 1 application topically 2 (two) times daily. 06/14/21   Argentina Donovan, PA-C  nystatin cream (MYCOSTATIN) Apply to affected area 2 times  daily Patient not taking: No sig reported 06/01/21   Leftwich-[name], Kathie Dike, CNM  oxyCODONE (ROXICODONE) 5 MG immediate release tablet Take 1 tablet (5 mg total) by mouth every 4 (four) hours as needed. Patient not taking: No sig reported 02/17/21   Angelia Mould, MD  pantoprazole (PROTONIX) 20 MG tablet Take 20 mg by mouth 2 (two) times daily. Patient not taking: No sig reported 01/30/21   [provider]  Prenatal Vit-Fe Fumarate-FA (PRENATAL VITAMINS) 28-0.8 MG TABS Take 1 tablet by mouth daily. Patient not taking: No sig reported 06/01/21   Leftwich-[name], Kathie Dike, CNM    Family History Family History  Problem Relation Age of Onset   Diabetes Mother    Diabetes Sister    Diabetes Brother    Hypertension Brother    Diabetes Sister    Diabetes Sister    Autism Son    Healthy Son    Healthy Daughter    Other Neg Hx     Social History Social History   Tobacco Use   Smoking status: Never   Smokeless tobacco: Never  Vaping Use   Vaping Use: Never used  Substance Use Topics   Alcohol use: No   Drug use: No     Allergies   Other   Review of Systems Review of Systems  Constitutional:  Negative for appetite change, chills, diaphoresis and fever.  HENT:  Positive for congestion. Negative for ear pain, rhinorrhea, sinus pressure, sinus pain and sore throat.   Eyes:  Negative for redness and visual disturbance.  Respiratory:  Positive for cough. Negative for chest tightness, shortness of breath and wheezing.   Cardiovascular:  Negative for chest pain and palpitations.  Gastrointestinal:  Positive for abdominal pain. Negative for blood in stool, constipation, diarrhea, nausea and vomiting.  Genitourinary:  Positive for frequency. Negative for decreased urine volume, difficulty urinating, dysuria, flank pain, genital sores, hematuria and urgency.  Musculoskeletal:  Negative for back pain and myalgias.  Neurological:  Negative for dizziness, weakness,  light-headedness and headaches.  Psychiatric/Behavioral:  Negative for confusion.   All other systems reviewed and are negative.   Physical Exam Triage Vital Signs ED Triage Vitals  Enc Vitals Group     BP 07/22/21 1404 120/87     Pulse Rate 07/22/21 1404 89     Resp 07/22/21 1404 18     Temp 07/22/21 1404 97.8 F (36.6 C)     Temp Source 07/22/21 1404 Oral     SpO2 07/22/21 1404 96 %     Weight --      Height --      Head Circumference --      Peak Flow --      Pain Score 07/22/21 1401 4  Pain Loc --      Pain Edu? --      Excl. in [place]? --    No data found.  Updated Vital Signs BP 120/87 (BP Location: Left Arm)   Pulse 89   Temp 97.8 F (36.6 C) (Oral)   Resp 18   LMP  (Within Weeks) Comment: 1 week  SpO2 96%   Breastfeeding No   Visual Acuity Right Eye Distance:   Left Eye Distance:   Bilateral Distance:    Right Eye Near:   Left Eye Near:    Bilateral Near:     Physical Exam Vitals reviewed.  Constitutional:      General: She is not in acute distress.    Appearance: Normal appearance. She is not ill-appearing.  HENT:     Head: Normocephalic and atraumatic.     Right Ear: Tympanic membrane, ear canal and external ear normal. No tenderness. No middle ear effusion. There is no impacted cerumen. Tympanic membrane is not perforated, erythematous, retracted or bulging.     Left Ear: Tympanic membrane, ear canal and external ear normal. No tenderness.  No middle ear effusion. There is no impacted cerumen. Tympanic membrane is not perforated, erythematous, retracted or bulging.     Nose: Nose normal. No congestion.     Mouth/Throat:     Mouth: Mucous membranes are moist.     Pharynx: Uvula midline. No oropharyngeal exudate or posterior oropharyngeal erythema.     Comments: Moist mucous membranes Eyes:     Extraocular Movements: Extraocular movements intact.     Pupils: Pupils are equal, round, and reactive to light.  Cardiovascular:     Rate and Rhythm:  Normal rate and regular rhythm.     Heart sounds: Normal heart sounds.  Pulmonary:     Effort: Pulmonary effort is normal.     Breath sounds: Normal breath sounds. No decreased breath sounds, wheezing, rhonchi or rales.  Abdominal:     General: Bowel sounds are normal. There is no distension.     Palpations: Abdomen is soft. There is no mass.     Tenderness: There is no abdominal tenderness. There is no right CVA tenderness, left CVA tenderness, guarding or rebound.  Genitourinary:    Comments: deferred Lymphadenopathy:     Cervical: No cervical adenopathy.     Right cervical: No superficial cervical adenopathy.    Left cervical: No superficial cervical adenopathy.  Skin:    General: Skin is warm.     Capillary Refill: Capillary refill takes less than 2 seconds.     Comments: Good skin turgor  Neurological:     General: No focal deficit present.     Mental Status: She is alert and oriented to person, place, and time.  Psychiatric:        Mood and Affect: Mood normal.        Behavior: Behavior normal.        Thought Content: Thought content normal.        Judgment: Judgment normal.     UC Treatments / Results  Labs (all labs ordered are listed, but only abnormal results are displayed) Labs Reviewed  POCT URINALYSIS DIPSTICK, ED / UC - Abnormal; Notable for the following components:      Result Value   Hgb urine dipstick TRACE (*)    All other components within normal limits  SARS CORONAVIRUS 2 (TAT 6-24 HRS)  POC INFLUENZA A AND B ANTIGEN (URGENT CARE ONLY)  POC URINE PREG, ED  EKG   Radiology No results found.  Procedures Procedures (including critical care time)  Medications Ordered in UC Medications - No data to display  Initial Impression / Assessment and Plan / UC Course  I have reviewed the triage vital signs and the nursing notes.  Pertinent labs & imaging results that were available during my care of the patient were reviewed by me and considered in  my medical decision making (see chart for details).     This patient is a very pleasant 40 y.o. year old female presenting with viral syndrome following exposure to influenza, and urinary symptoms. Today this pt is afebrile nontachycardic nontachypneic, oxygenating well on room air, no wheezes rhonchi or rales.   Rapid influenza negative, suspect this was a false negative. She is out of the tamiflu window.  Promethazine DM sent. Covid test sent at patient request.   UA with trace blood, otherwise wnl, did not send culture. U-preg negative.  Suspect vaginitis.  She declines cervical vaginal swab as she is seeing her gynecologist in 2 days.  She prefers to wait for treatment until then.  ED return precautions discussed. Patient verbalizes understanding and agreement.    Final Clinical Impressions(s) / UC Diagnoses   Final diagnoses:  Suspected novel influenza A virus infection  Exposure to influenza  Encounter for screening for COVID-19     Discharge Instructions      -Promethazine DM cough syrup for congestion/cough. This could make you drowsy, so take at night before bed. -With a virus, you're typically contagious for 5-7 days, or as long as you're having fevers.     ED Prescriptions     Medication Sig Dispense Auth. Provider   promethazine-dextromethorphan (PROMETHAZINE-DM) 6.25-15 MG/5ML syrup Take 5 mLs by mouth 4 (four) times daily as needed for cough. 118 mL Hazel Sams, PA-C      PDMP not reviewed this encounter.   Hazel Sams, PA-C 07/22/21 1449

## 2021-07-23 LAB — SARS CORONAVIRUS 2 (TAT 6-24 HRS): SARS Coronavirus 2: NEGATIVE

## 2021-07-24 ENCOUNTER — Other Ambulatory Visit: Payer: Self-pay

## 2021-07-24 ENCOUNTER — Telehealth: Payer: Self-pay | Admitting: Nurse Practitioner

## 2021-07-24 ENCOUNTER — Encounter: Payer: Self-pay | Admitting: Physician Assistant

## 2021-07-24 ENCOUNTER — Ambulatory Visit: Payer: Medicaid Other | Attending: Nurse Practitioner | Admitting: Physician Assistant

## 2021-07-24 VITALS — BP 99/62 | HR 103 | Temp 98.7°F | Resp 18 | Ht 67.0 in | Wt 195.0 lb

## 2021-07-24 DIAGNOSIS — J028 Acute pharyngitis due to other specified organisms: Secondary | ICD-10-CM | POA: Diagnosis not present

## 2021-07-24 DIAGNOSIS — N736 Female pelvic peritoneal adhesions (postinfective): Secondary | ICD-10-CM

## 2021-07-24 MED ORDER — BENZONATATE 100 MG PO CAPS
100.0000 mg | ORAL_CAPSULE | Freq: Two times a day (BID) | ORAL | 0 refills | Status: DC | PRN
  Filled 2021-07-24: qty 20, 10d supply, fill #0

## 2021-07-24 MED ORDER — AZITHROMYCIN 250 MG PO TABS: ORAL_TABLET | ORAL | 0 refills | Status: DC

## 2021-07-24 MED ORDER — BENZONATATE 100 MG PO CAPS: 100.0000 mg | ORAL_CAPSULE | Freq: Two times a day (BID) | ORAL | 0 refills | Status: DC | PRN

## 2021-07-24 MED ORDER — AZITHROMYCIN 250 MG PO TABS
ORAL_TABLET | ORAL | 0 refills | Status: DC
  Filled 2021-07-24: qty 6, 5d supply, fill #0

## 2021-07-24 NOTE — Telephone Encounter (Signed)
Medication: Rx #: [id number] azithromycin (ZITHROMAX) 250 MG tablet [[id number]] ,Rx #: [id number] benzonatate (TESSALON) 100 MG capsule [[id number]] - medications where sent to the wrong pharmacy. Can they be sent to the pharmacy below please    Has the patient contacted their pharmacy? Yes Advised to contact the office (Agent: If no, request that the patient contact the pharmacy for the refill. If patient does not wish to contact the pharmacy document the reason why and proceed with request.) (Agent: If yes, when and what did the pharmacy advise?)  Preferred Pharmacy (with phone number or street name): [facility], [place] [facility] [address] [place] [address] Phone: [phone] Fax: [phone] Hours: Not open 24 hours   Has the patient been seen for an appointment in the last year OR does the patient have an upcoming appointment? YES TODAY  Agent: Please be advised that RX refills may take up to 3 business days. We ask that you follow-up with your pharmacy.

## 2021-07-24 NOTE — Progress Notes (Signed)
Patient has eaten and taken medication today Patient complains of throat pain and cough which was evaluated 2 days ago in UC. Patient denies bleeding but express pain in the abdomen.

## 2021-07-24 NOTE — Telephone Encounter (Signed)
Please send to outside location even though pharmacy was verified with patient during visit.

## 2021-07-24 NOTE — Patient Instructions (Signed)
You will take azithromycin as directed, and you can use Tessalon Perles to help you with your cough.  Make sure that you are drinking lots of water and getting plenty of rest.  Make sure that you call to set up an appointment to follow-up with gynecology.  I hope that you feel better soon  Michaela Rad, PA-C Physician Assistant [facility] [email or web address]   Upper Respiratory Infection, Adult An upper respiratory infection (URI) is a common viral infection of the nose, throat, and upper air passages that lead to the lungs. The most common type of URI is the common cold. URIs usually get better on their own, without medical treatment. What are the causes? A URI is caused by a virus. You may catch a virus by: Breathing in droplets from an infected person's cough or sneeze. Touching something that has been exposed to the virus (is contaminated) and then touching your mouth, nose, or eyes. What increases the risk? You are more likely to get a URI if: You are very young or very old. You have close contact with others, such as at work, school, or a health care facility. You smoke. You have long-term (chronic) heart or lung disease. You have a weakened disease-fighting system (immune system). You have nasal allergies or asthma. You are experiencing a lot of stress. You have poor nutrition. What are the signs or symptoms? A URI usually involves some of the following symptoms: Runny or stuffy (congested) nose. Cough. Sneezing. Sore throat. Headache. Fatigue. Fever. Loss of appetite. Pain in your forehead, behind your eyes, and over your cheekbones (sinus pain). Muscle aches. Redness or irritation of the eyes. Pressure in the ears or face. How is this diagnosed? This condition may be diagnosed based on your medical history and symptoms, and a physical exam. Your health care provider may use a swab to take a mucus sample from  your nose (nasal swab). This sample can be tested to determine what virus is causing the illness. How is this treated? URIs usually get better on their own within 7-10 days. Medicines cannot cure URIs, but your health care provider may recommend certain medicines to help relieve symptoms, such as: Over-the-counter cold medicines. Cough suppressants. Coughing is a type of defense against infection that helps to clear the respiratory system, so take these medicines only as recommended by your health care provider. Fever-reducing medicines. Follow these instructions at home: Activity Rest as needed. If you have a fever, stay home from work or school until your fever is gone or until your health care provider says your URI cannot spread to other people (is no longer contagious). Your health care provider may have you wear a face mask to prevent your infection from spreading. Relieving symptoms Gargle with a mixture of salt and water 3-4 times a day or as needed. To make salt water, completely dissolve -1 tsp (3-6 g) of salt in 1 cup (237 mL) of warm water. Use a cool-mist humidifier to add moisture to the air. This can help you breathe more easily. Eating and drinking  Drink enough fluid to keep your urine pale yellow. Eat soups and other clear broths. General instructions  Take over-the-counter and prescription medicines only as told by your health care provider. These include cold medicines, fever reducers, and cough suppressants. Do not use any products that contain nicotine or tobacco. These products include cigarettes, chewing tobacco, and vaping devices, such as e-cigarettes. If you need help quitting, ask your health care provider.  Stay away from secondhand smoke. Stay up to date on all immunizations, including the yearly (annual) flu vaccine. Keep all follow-up visits. This is important. How to prevent the spread of infection to others URIs can be contagious. To prevent the infection  from spreading: Wash your hands with soap and water for at least 20 seconds. If soap and water are not available, use hand sanitizer. Avoid touching your mouth, face, eyes, or nose. Cough or sneeze into a tissue or your sleeve or elbow instead of into your hand or into the air.  Contact a health care provider if: You are getting worse instead of better. You have a fever or chills. Your mucus is brown or red. You have yellow or brown discharge coming from your nose. You have pain in your face, especially when you bend forward. You have swollen neck glands. You have pain while swallowing. You have white areas in the back of your throat. Get help right away if: You have shortness of breath that gets worse. You have severe or persistent: Headache. Ear pain. Sinus pain. Chest pain. You have chronic lung disease along with any of the following: Making high-pitched whistling sounds when you breathe, most often when you breathe out (wheezing). Prolonged cough (more than 14 days). Coughing up blood. A change in your usual mucus. You have a stiff neck. You have changes in your: Vision. Hearing. Thinking. Mood. These symptoms may be an emergency. Get help right away. Call 911. Do not wait to see if the symptoms will go away. Do not drive yourself to the hospital. Summary An upper respiratory infection (URI) is a common infection of the nose, throat, and upper air passages that lead to the lungs. A URI is caused by a virus. URIs usually get better on their own within 7-10 days. Medicines cannot cure URIs, but your health care provider may recommend certain medicines to help relieve symptoms. This information is not intended to replace advice given to you by your health care provider. Make sure you discuss any questions you have with your health care provider. Document Revised: 04/05/2021 Document Reviewed: 04/05/2021 Elsevier Patient Education  [place].

## 2021-07-24 NOTE — Telephone Encounter (Signed)
See encounter

## 2021-07-24 NOTE — Progress Notes (Signed)
Established Patient Office Visit  Subjective:  Patient ID: Michaela Moran, female    DOB: Feb 15, 1981  Age: 40 y.o. MRN: [id number]  CC:  Chief Complaint  Patient presents with   Abdominal Pain   Cough    HPI [name] reports that she has been having a productive cough with yellow sputum, tightness in her chest, sore throat, headache and body aches.  Reports that the symptoms started approximately a week ago.  Reports that her son has been sick with similar symptoms.  Reports that she went to urgent care 2 days ago and had a negative flu test, negative COVID test.  Reports that she was given cough medication without much relief.    Reports that she does continue to also have suprapubic and lower left and lower right abdominal pain.  Reports that she did see gynecology on June 06, 2021 for this complaint.  States that she did take the 1 month course of birth control with relief of abnormal bleeding, but does state the pain has not resolved, describes it as "striking", will use Tylenol with relief  GYN note 06/06/21  Sandralee Neumeister is a 40 y.o. female here at [facility] for a follow up visit after MAU/ED visit. She presented to [facility] on 06/01/21 with vaginal itching and abdominal pain and was triaged and medications sent to pharmacy to treat yeast infection, but not evaluated further since pt was not pregnant.  She then presented to [facility] on 06/03/21 with flank and abdominal pain and was sent to the ED.  Imaging did not find renal or other abdominal abnormalities but did find uterine fibroids.  Current complaints: pain is resolved but she continues to have light menstrual bleeding.  Personal health questionnaire reviewed: yes.   1. Intramural and submucous leiomyoma of uterus --TVUS in ED on 06/03/21 reveals fibroids --Discussed results and [place] interpreter used at pt request to explain and use medical terms. Otherwise pt has not required interpreter.     2. Abnormal uterine bleeding  (AUB) --AUB is main symptoms of fibroids, as pain has now resolved.  --Discussed options including expectant management, hormones used to reduce bleeding, or IUD or surgical options --Pt would like to try 1 month of OCPs, then stop and see what bleeding/pain are like --She desires another pregnancy --Lo loestrin sample given from office today, lowest estrogen dose for short course --Pt without risk factors other than age --Warning signs/reasons to see care reviewed --F/U with me in 3 months --Pap at next visit?   3. Pelvic pain in female --Likely from uterine fibroids, see above   Fatima Blank, CNM 3:58 PM      Past Medical History:  Diagnosis Date   Acid reflux    Female circumcision    Hemorrhoid    History of positive PPD    neg CXR   Hypercholesteremia     Past Surgical History:  Procedure Laterality Date   ARTERY BIOPSY Right 02/17/2021   Procedure: RIGHT TEMPORAL ARTERY BIOPSY;  Surgeon: Angelia Mould, MD;  Location: [place];  Service: Vascular;  Laterality: Right;   [place] N/A 01/02/2013   Procedure: CESAREAN SECTION;  Surgeon: Donnamae Jude, MD;  Location: [facility];  Service: Obstetrics;  Laterality: N/A;  Incidental Cyystotomy   CESAREAN SECTION N/A 06/26/2016   Procedure: CESAREAN SECTION;  Surgeon: Aletha Halim, MD;  Location: [place];  Service: Obstetrics;  Laterality: N/A;   CYSTOTOMY  with repair at 2014 repeat c-section    Family History  Problem Relation Age of Onset   Diabetes Mother    Diabetes Sister    Diabetes Brother    Hypertension Brother    Diabetes Sister    Diabetes Sister    Autism Son    Healthy Son    Healthy Daughter    Other Neg Hx     Social History   Socioeconomic History   Marital status: Married    Spouse name: Not on file   Number of children: Not on file   Years of education: Not on file   Highest education level: Not on file  Occupational History   Not on  file  Tobacco Use   Smoking status: Never   Smokeless tobacco: Never  Vaping Use   Vaping Use: Never used  Substance and Sexual Activity   Alcohol use: No   Drug use: No   Sexual activity: Yes    Birth control/protection: None  Other Topics Concern   Not on file  Social History Narrative   Not on file   Social Determinants of Health   Financial Resource Strain: Not on file  Food Insecurity: Not on file  Transportation Needs: Not on file  Physical Activity: Not on file  Stress: Not on file  Social Connections: Not on file  Intimate Partner Violence: Not on file    Outpatient Medications Prior to Visit  Medication Sig Dispense Refill   gabapentin (NEURONTIN) 300 MG capsule Take 1 capsule (300 mg total) by mouth 3 (three) times daily. 90 capsule 3   promethazine-dextromethorphan (PROMETHAZINE-DM) 6.25-15 MG/5ML syrup Take 5 mLs by mouth 4 (four) times daily as needed for cough. 118 mL 0   acetaminophen (TYLENOL) 500 MG tablet Take 500 mg by mouth every 6 (six) hours as needed for mild pain. (Patient not taking: No sig reported)     etonogestrel (NEXPLANON) 68 MG IMPL implant 1 each by Subdermal route once. Implanted October 2017 (Patient not taking: No sig reported)     fluconazole (DIFLUCAN) 150 MG tablet Take one tablet now and one tablet in 3 days (Patient not taking: No sig reported) 2 tablet 0   fluticasone (FLONASE) 50 MCG/ACT nasal spray Place 1 spray into both nostrils daily. (Patient not taking: No sig reported)     ibuprofen (ADVIL) 600 MG tablet Take 1 tablet (600 mg total) by mouth every 6 (six) hours as needed. (Patient not taking: No sig reported) 30 tablet 0   mupirocin ointment (BACTROBAN) 2 % Apply 1 application topically 2 (two) times daily. (Patient not taking: Reported on 07/24/2021) 22 g 0   nystatin cream (MYCOSTATIN) Apply to affected area 2 times daily (Patient not taking: No sig reported) 30 g 0   oxyCODONE (ROXICODONE) 5 MG immediate release tablet Take 1  tablet (5 mg total) by mouth every 4 (four) hours as needed. (Patient not taking: No sig reported) 15 tablet 0   pantoprazole (PROTONIX) 20 MG tablet Take 20 mg by mouth 2 (two) times daily. (Patient not taking: No sig reported)     Prenatal Vit-Fe Fumarate-FA (PRENATAL VITAMINS) 28-0.8 MG TABS Take 1 tablet by mouth daily. (Patient not taking: No sig reported) 30 tablet 5   doxycycline (VIBRA-TABS) 100 MG tablet Take 1 tablet (100 mg total) by mouth 2 (two) times daily. 20 tablet 0   No facility-administered medications prior to visit.    Allergies  Allergen Reactions   Other Swelling  Allergic to peas.    ROS Review of Systems  Constitutional:  Negative for chills and fever.  HENT:  Positive for congestion, rhinorrhea, sinus pressure and sore throat. Negative for ear pain and trouble swallowing.   Eyes: Negative.   Respiratory:  Positive for cough. Negative for shortness of breath and wheezing.   Cardiovascular:  Negative for chest pain.  Gastrointestinal:  Positive for abdominal pain. Negative for diarrhea, nausea and vomiting.  Endocrine: Negative.   Genitourinary:  Negative for dysuria, vaginal bleeding and vaginal discharge.  Musculoskeletal:  Positive for myalgias.  Skin: Negative.   Allergic/Immunologic: Negative.   Neurological:  Positive for headaches. Negative for dizziness and weakness.  Hematological: Negative.   Psychiatric/Behavioral: Negative.       Objective:    Physical Exam Vitals and nursing note reviewed.  Constitutional:      General: She is not in acute distress.    Appearance: Normal appearance. She is well-developed. She is not ill-appearing.  HENT:     Head: Normocephalic and atraumatic.     Jaw: There is normal jaw occlusion.     Salivary Glands: Right salivary gland is diffusely enlarged. Left salivary gland is diffusely enlarged.     Right Ear: Tympanic membrane, ear canal and external ear normal.     Left Ear: Tympanic membrane, ear canal  and external ear normal.     Nose:     Right Turbinates: Swollen.     Left Turbinates: Swollen.     Right Sinus: Maxillary sinus tenderness and frontal sinus tenderness present.     Left Sinus: Maxillary sinus tenderness and frontal sinus tenderness present.     Mouth/Throat:     Lips: Pink.     Mouth: Mucous membranes are moist.     Pharynx: Posterior oropharyngeal erythema present.     Tonsils: No tonsillar exudate or tonsillar abscesses.  Cardiovascular:     Rate and Rhythm: Normal rate and regular rhythm.     Pulses: Normal pulses.  Pulmonary:     Effort: Pulmonary effort is normal.     Breath sounds: Normal breath sounds. No wheezing.  Musculoskeletal:        General: Normal range of motion.     Cervical back: Normal range of motion and neck supple.  Skin:    General: Skin is warm and dry.  Neurological:     General: No focal deficit present.     Mental Status: She is alert and oriented to person, place, and time.  Psychiatric:        Mood and Affect: Mood normal.        Behavior: Behavior normal.        Thought Content: Thought content normal.        Judgment: Judgment normal.    BP 99/62 (BP Location: Left Arm, Patient Position: Sitting, Cuff Size: Normal)   Pulse (!) 103   Temp 98.7 F (37.1 C) (Oral)   Resp 18   Ht 5\' 7"  (1.702 m)   Wt 195 lb (88.5 kg)   SpO2 95%   BMI 30.54 kg/m  Wt Readings from Last 3 Encounters:  07/24/21 195 lb (88.5 kg)  06/14/21 203 lb 6.4 oz (92.3 kg)  06/06/21 196 lb 6.4 oz (89.1 kg)     Health Maintenance Due  Topic Date Due   Pneumococcal Vaccine 48-25 Years old (1 - PCV) Never done   FOOT EXAM  Never done   OPHTHALMOLOGY EXAM  Never done   URINE MICROALBUMIN  Never done   COVID-19 Vaccine (3 - Booster for Pfizer series) 04/12/2020   HEMOGLOBIN A1C  11/03/2020   INFLUENZA VACCINE  04/17/2021    There are no preventive care reminders to display for this patient.  Lab Results  Component Value Date   TSH 2.890  05/11/2019   Lab Results  Component Value Date   WBC 4.9 06/03/2021   HGB 13.7 06/03/2021   HCT 40.0 06/03/2021   MCV 92.2 06/03/2021   PLT 316 06/03/2021   Lab Results  Component Value Date   NA 136 06/03/2021   K 3.8 06/03/2021   CO2 26 06/03/2021   GLUCOSE 77 06/03/2021   BUN 9 06/03/2021   CREATININE 0.73 06/03/2021   BILITOT 0.5 06/03/2021   ALKPHOS 61 06/03/2021   AST 22 06/03/2021   ALT 17 06/03/2021   PROT 7.0 06/03/2021   ALBUMIN 3.5 06/03/2021   CALCIUM 8.8 (L) 06/03/2021   ANIONGAP 6 06/03/2021   Lab Results  Component Value Date   CHOL 216 (H) 05/03/2020   Lab Results  Component Value Date   HDL 51 05/03/2020   Lab Results  Component Value Date   LDLCALC 146 (H) 05/03/2020   Lab Results  Component Value Date   TRIG 104 05/03/2020   Lab Results  Component Value Date   CHOLHDL 4.2 05/03/2020   Lab Results  Component Value Date   HGBA1C 5.7 (H) 05/03/2020      Assessment & Plan:   Problem List Items Addressed This Visit       Other   Pelvic adhesive disease   Other Visit Diagnoses     Pharyngitis due to other organism    -  Primary   Relevant Medications   azithromycin (ZITHROMAX) 250 MG tablet   benzonatate (TESSALON) 100 MG capsule       Meds ordered this encounter  Medications   DISCONTD: azithromycin (ZITHROMAX) 250 MG tablet    Sig: Take 2 tablets by mouth on day 1, then take 1 tablet once daily    Dispense:  6 tablet    Refill:  0    Order Specific Question:   Supervising Provider    Answer:   Elsie Stain [[id number]]   DISCONTD: benzonatate (TESSALON) 100 MG capsule    Sig: Take 1 capsule (100 mg total) by mouth 2 (two) times daily as needed for cough.    Dispense:  20 capsule    Refill:  0    Order Specific Question:   Supervising Provider    Answer:   Elsie Stain [[id number]]   azithromycin (ZITHROMAX) 250 MG tablet    Sig: Take 2 tablets by mouth on day 1, then take 1 tablet once daily    Dispense:  6 tablet     Refill:  0    Order Specific Question:   Supervising Provider    Answer:   Elsie Stain [[id number]]   benzonatate (TESSALON) 100 MG capsule    Sig: Take 1 capsule (100 mg total) by mouth 2 (two) times daily as needed for cough.    Dispense:  20 capsule    Refill:  0    Order Specific Question:   Supervising Provider    Answer:   WRIGHT, PATRICK E [[id number]]  1. Pharyngitis due to other organism Trial azithromycin, Tessalon Perles.  Patient education given on supportive care.  Red flags given for prompt reevaluation. - azithromycin (ZITHROMAX) 250 MG tablet; Take 2 tablets by mouth on day  1, then take 1 tablet once daily  Dispense: 6 tablet; Refill: 0 - benzonatate (TESSALON) 100 MG capsule; Take 1 capsule (100 mg total) by mouth 2 (two) times daily as needed for cough.  Dispense: 20 capsule; Refill: 0  2. Pelvic adhesive disease Encouraged follow-up with gynecology, patient understands and agrees.   I have reviewed the patient's medical history (PMH, PSH, Social History, Family History, Medications, and allergies) , and have been updated if relevant. I spent 30 minutes reviewing chart and  face to face time with patient.    Follow-up: Return if symptoms worsen or fail to improve.    Loraine Grip Mayers, PA-C

## 2021-07-26 ENCOUNTER — Other Ambulatory Visit: Payer: Self-pay

## 2021-07-28 ENCOUNTER — Other Ambulatory Visit: Payer: Self-pay

## 2021-08-24 ENCOUNTER — Ambulatory Visit: Payer: Self-pay | Admitting: *Deleted

## 2021-08-24 NOTE — Telephone Encounter (Signed)
Pt reports cough, productive for bright red blood at times and "Whitish." Onset 2 days ago. Reports left ear pain, left neck pain and swelling, sore throat, headache. Mild SOB. States awake all neck with cough and ear ,neck pain. States difficult to swallow. Denies fever. Advised UC. Pt states will follow disposition.  Assisted by Interpreter # [phone]  [place]     Reason for Disposition  [1] MILD difficulty breathing (e.g., minimal/no SOB at rest, SOB with walking, pulse <100) AND [2] still present when not coughing (Exception: no change from usual, chronic shortness of breath)  Answer Assessment - Initial Assessment Questions 1. ONSET: "When did the cough begin?"      2 days ago 2. SEVERITY: "How bad is the cough today?" "Did the blood appear after a coughing spell?"      Awake all night 3. SPUTUM: "Describe the color of your sputum" (none, dry cough; clear, white, yellow, green)     White with blood 4. HEMOPTYSIS: "How much blood?" (flecks, streaks, tablespoons, etc.)     Bright red blood with whitish 5. DIFFICULTY BREATHING: "Are you having difficulty breathing?" If Yes, ask: "How bad is it?" (e.g., mild, moderate, severe)    - MILD: No SOB at rest, mild SOB with walking, speaks normally in sentences, can lie down, no retractions, pulse < 100.    - MODERATE: SOB at rest, SOB with minimal exertion and prefers to sit, cannot lie down flat, speaks in phrases, mild retractions, audible wheezing, pulse 100-120.    - SEVERE: Very SOB at rest, speaks in single words, struggling to breathe, sitting hunched forward, retractions, pulse > 120      mild 6. FEVER: "Do you have a fever?" If Yes, ask: "What is your temperature, how was it measured, and when did it start?"     no 7. CARDIAC HISTORY: "Do you have any history of heart disease?" (e.g., heart attack, congestive heart failure)       8. LUNG HISTORY: "Do you have any history of lung disease?"  (e.g., pulmonary embolus, asthma, emphysema)       9. PE RISK FACTORS: "Do you have a history of blood clots?" (or: recent major surgery, recent prolonged travel, bedridden)      10. OTHER SYMPTOMS: "Do you have any other symptoms?" (e.g., runny nose, wheezing, chest pain)       Ear ache, neck pain, headache left side. Swelling left side of face, swelling left side of neck  Protocols used: Coughing Up Blood-A-AH

## 2021-08-28 ENCOUNTER — Encounter (HOSPITAL_COMMUNITY): Payer: Self-pay | Admitting: Emergency Medicine

## 2021-08-28 ENCOUNTER — Ambulatory Visit (HOSPITAL_COMMUNITY)
Admission: EM | Admit: 2021-08-28 | Discharge: 2021-08-28 | Disposition: A | Discharge status: Home / Self Care | Payer: Medicaid Other | Attending: Physician Assistant | Admitting: Physician Assistant

## 2021-08-28 ENCOUNTER — Other Ambulatory Visit: Payer: Self-pay

## 2021-08-28 ENCOUNTER — Ambulatory Visit (INDEPENDENT_AMBULATORY_CARE_PROVIDER_SITE_OTHER): Payer: Medicaid Other

## 2021-08-28 DIAGNOSIS — J329 Chronic sinusitis, unspecified: Secondary | ICD-10-CM | POA: Diagnosis not present

## 2021-08-28 DIAGNOSIS — J40 Bronchitis, not specified as acute or chronic: Secondary | ICD-10-CM | POA: Diagnosis not present

## 2021-08-28 DIAGNOSIS — R059 Cough, unspecified: Secondary | ICD-10-CM

## 2021-08-28 DIAGNOSIS — R051 Acute cough: Secondary | ICD-10-CM

## 2021-08-28 MED ORDER — PREDNISONE 20 MG PO TABS: 40.0000 mg | ORAL_TABLET | Freq: Every day | ORAL | 0 refills | Status: AC

## 2021-08-28 MED ORDER — ALBUTEROL SULFATE HFA 108 (90 BASE) MCG/ACT IN AERS: 1.0000 | INHALATION_SPRAY | Freq: Four times a day (QID) | RESPIRATORY_TRACT | 0 refills | Status: DC | PRN

## 2021-08-28 MED ORDER — AMOXICILLIN-POT CLAVULANATE 875-125 MG PO TABS: 1.0000 | ORAL_TABLET | Freq: Two times a day (BID) | ORAL | 0 refills | Status: DC

## 2021-08-28 NOTE — ED Provider Notes (Signed)
[name]    CSN: [id number] Arrival date & time: 08/28/21  0802      History   Chief Complaint Chief Complaint  Patient presents with   Cough   Otalgia   Back Pain    HPI Michaela Moran is a 40 y.o. female.   Patient presents today with a 4 to 5-week history of persistent cough.  She is Somali speaking and video interpreter was utilized during visit.  She was seen by clinic on 07/22/2021 which point symptoms were attributed to viral illness.  She does have negative for influenza and COVID-19 at that time.  She was given Promethazine DM but I continued symptoms he was seen by PCP on 07/24/2021 at which point she was prescribed azithromycin and Tessalon.  Despite medication she continues to have significant symptoms including dry cough.  She does report occasional blood-tinged sputum as well as right otalgia.  Denies any fever, nausea, vomiting, body aches, chest pain, shortness of breath.  She has been using NyQuil, Benadryl, over-the-counter cough medicine, ginger without improvement of symptoms.  She does have a history of bronchitis but denies diagnosis of asthma or COPD.  She has used an albuterol inhaler in the past but has not been using this recently.  She does report a history of prediabetes but has been able to manage this with diet.  She has no concern for pregnancy.   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 04/28/2016   Previous cesarean delivery, antepartum condition or complication 85/63/1497   Language barrier, cultural  differences 04/28/2016    Past Surgical History:  Procedure Laterality Date   ARTERY BIOPSY Right 02/17/2021   Procedure: RIGHT TEMPORAL ARTERY BIOPSY;  Surgeon: Angelia Mould, MD;  Location: [facility];  Service: Vascular;  Laterality: Right;   [place] N/A 01/02/2013   Procedure: CESAREAN SECTION;  Surgeon: Donnamae Jude, MD;  Location: [facility];  Service: Obstetrics;  Laterality: N/A;  Incidental Cyystotomy   CESAREAN SECTION N/A 06/26/2016   Procedure: CESAREAN SECTION;  Surgeon: Aletha Halim, MD;  Location: [facility];  Service: Obstetrics;  Laterality: N/A;   CYSTOTOMY     with repair at 2014 repeat c-section    OB History     Gravida  3   Para  3   Term  3   Preterm      AB      Living  3      SAB      IAB      Ectopic      Multiple  0   Live Births  3            Home Medications    Prior to Admission medications   Medication Sig Start Date End Date Taking? Authorizing Provider  albuterol (VENTOLIN HFA) 108 (90 Base) MCG/ACT inhaler Inhale 1-2 puffs into the lungs every 6 (six) hours as needed for wheezing or shortness of breath. 08/28/21  Yes Elward Nocera K, PA-C  amoxicillin-clavulanate (AUGMENTIN) 875-125 MG tablet Take 1 tablet by mouth every 12 (twelve) hours. 08/28/21  Yes Gracelyn Coventry K, PA-C  predniSONE (DELTASONE) 20 MG tablet Take 2 tablets (40 mg total) by mouth daily with breakfast for 4 days. 08/28/21 09/01/21 Yes Analissa Bayless, Derry Skill, PA-C  acetaminophen (TYLENOL) 500 MG tablet Take 500 mg by mouth every 6 (six) hours as needed for mild pain. Patient not taking: No sig reported    [provider]  etonogestrel (NEXPLANON) 68 MG IMPL implant 1 each by Subdermal route once. Implanted October 2017 Patient not taking: No sig reported    [provider]  gabapentin (NEURONTIN) 300 MG capsule Take 1 capsule (300 mg total) by mouth 3 (three) times daily. 08/30/20 07/24/21  Gildardo Pounds, NP   mupirocin ointment (BACTROBAN) 2 % Apply 1 application topically 2 (two) times daily. Patient not taking: Reported on 07/24/2021 06/14/21   Argentina Donovan, PA-C  nystatin cream (MYCOSTATIN) Apply to affected area 2 times daily Patient not taking: No sig reported 06/01/21   Elvera Maria, CNM  pantoprazole (PROTONIX) 20 MG tablet Take 20 mg by mouth 2 (two) times daily. Patient not taking: No sig reported 01/30/21   [provider]  Prenatal Vit-Fe Fumarate-FA (PRENATAL VITAMINS) 28-0.8 MG TABS Take 1 tablet by mouth daily. Patient not taking: No sig reported 06/01/21   Leftwich-[name], Kathie Dike, CNM    Family History Family History  Problem Relation Age of Onset   Diabetes Mother    Diabetes Sister    Diabetes Brother    Hypertension Brother    Diabetes Sister    Diabetes Sister    Autism Son    Healthy Son    Healthy Daughter    Other Neg Hx     Social History Social History   Tobacco Use   Smoking status: Never   Smokeless tobacco: Never  Vaping Use   Vaping Use: Never used  Substance Use Topics   Alcohol use: No   Drug use: No     Allergies   Other   Review of Systems Review of Systems  Constitutional:  Positive for activity change. Negative for appetite change, fatigue and fever.  HENT:  Positive for ear pain. Negative for congestion, sinus pressure, sneezing and sore throat.   Respiratory:  Positive for cough. Negative for shortness of breath.   Cardiovascular:  Negative for chest pain.  Gastrointestinal:  Negative for abdominal pain, diarrhea, nausea and vomiting.  Musculoskeletal:  Negative for arthralgias and myalgias.  Neurological:  Positive for headaches. Negative for dizziness and light-headedness.    Physical Exam Triage Vital Signs ED Triage Vitals  Enc Vitals Group     BP 08/28/21 0832 110/78     Pulse Rate 08/28/21 0832 75     Resp 08/28/21 0832 17     Temp 08/28/21 0832 98.1 F (36.7 C)     Temp Source 08/28/21 0832 Oral      SpO2 08/28/21 0832 96 %     Weight --      Height --      Head Circumference --      Peak Flow --      Pain Score 08/28/21 0833 6     Pain Loc --      Pain Edu? --      Excl. in [place]? --    No data found.  Updated Vital Signs BP 110/78 (BP Location: Left Arm)  Pulse 75   Temp 98.1 F (36.7 C) (Oral)   Resp 17   LMP 08/22/2021   SpO2 96%   Visual Acuity Right Eye Distance:   Left Eye Distance:   Bilateral Distance:    Right Eye Near:   Left Eye Near:    Bilateral Near:     Physical Exam Vitals reviewed.  Constitutional:      General: She is awake. She is not in acute distress.    Appearance: Normal appearance. She is well-developed. She is not ill-appearing.     Comments: Very pleasant female appears stated age no acute distress sitting comfortably in exam room  HENT:     Head: Normocephalic and atraumatic.     Right Ear: Tympanic membrane, ear canal and external ear normal. Tympanic membrane is not erythematous or bulging.     Left Ear: Ear canal and external ear normal. Tympanic membrane is injected. Tympanic membrane is not erythematous or bulging.     Nose:     Right Sinus: Maxillary sinus tenderness present. No frontal sinus tenderness.     Left Sinus: Maxillary sinus tenderness present. No frontal sinus tenderness.     Mouth/Throat:     Pharynx: Uvula midline. Posterior oropharyngeal erythema present. No oropharyngeal exudate.     Comments: Erythema and drainage present posterior oropharynx Cardiovascular:     Rate and Rhythm: Normal rate and regular rhythm.     Heart sounds: Normal heart sounds, S1 normal and S2 normal. No murmur heard. Pulmonary:     Effort: Pulmonary effort is normal.     Breath sounds: Normal breath sounds. No wheezing, rhonchi or rales.     Comments: Reactive cough with deep breathing Psychiatric:        Behavior: Behavior is cooperative.     UC Treatments / Results  Labs (all labs ordered are listed, but only abnormal results  are displayed) Labs Reviewed - No data to display  EKG   Radiology DG Chest 2 View  Result Date: 08/28/2021 CLINICAL DATA:  40 year old female with a month of cough EXAM: CHEST - 2 VIEW COMPARISON:  01/28/2021 FINDINGS: Cardiomediastinal silhouette unchanged in size and contour. No evidence of central vascular congestion. No interlobular septal thickening. No pneumothorax or pleural effusion. No confluent airspace disease. No acute displaced fracture IMPRESSION: No radiographic evidence of acute cardiopulmonary disease Electronically Signed   By: Corrie Mckusick D.O.   On: 08/28/2021 09:04    Procedures Procedures (including critical care time)  Medications Ordered in UC Medications - No data to display  Initial Impression / Assessment and Plan / UC Course  I have reviewed the triage vital signs and the nursing notes.  Pertinent labs & imaging results that were available during my care of the patient were reviewed by me and considered in my medical decision making (see chart for details).     Chest x-ray obtained given prolonged and persistent cough showed no acute abnormalities.  Given persistent productive cough will cover with Augmentin as well as start prednisone.  Patient was instructed not to take NSAIDs with prednisone due to risk of GI bleeding.  Recommended she use Mucinex, Flonase, Tylenol for symptom relief.  She was provided refill of albuterol inhaler with instruction to use this every 4-6 hours as needed for shortness of breath/coughing fits.  Discussed that if she is requiring regular use of this medication we may consider maintenance medication such as Symbicort in the future.  She is to rest and drink plenty of  fluid.  Discussed alarm symptoms that warrant emergent evaluation.  Strict return precautions given to which she expressed understanding.  Final Clinical Impressions(s) / UC Diagnoses   Final diagnoses:  Sinobronchitis  Acute cough     Discharge Instructions       Your x-ray was normal.  I am concerned that you have a sinus/bronchitis infection.  Please start Augmentin twice daily.  Start prednisone in the morning for 4 days.  While taking this do not take NSAIDs including aspirin, ibuprofen/Advil, naproxen/Aleve.  You can use Tylenol, Mucinex, Flonase for symptom relief.  I have refilled your albuterol inhaler.  Use this every 4-6 hours as needed for shortness of breath.  If your symptoms or not improving please return for reevaluation.  If anything worsens and you develop high fever, worsening shortness of breath, nausea/vomiting interfering with oral intake, chest pain, shortness of breath you need to go to the emergency room.     ED Prescriptions     Medication Sig Dispense Auth. Provider   albuterol (VENTOLIN HFA) 108 (90 Base) MCG/ACT inhaler Inhale 1-2 puffs into the lungs every 6 (six) hours as needed for wheezing or shortness of breath. 8 g Bain Whichard K, PA-C   predniSONE (DELTASONE) 20 MG tablet Take 2 tablets (40 mg total) by mouth daily with breakfast for 4 days. 8 tablet Lanecia Sliva K, PA-C   amoxicillin-clavulanate (AUGMENTIN) 875-125 MG tablet Take 1 tablet by mouth every 12 (twelve) hours. 14 tablet Cherrill Scrima, Derry Skill, PA-C      PDMP not reviewed this encounter.   Terrilee Croak, PA-C 08/28/21 [id number]

## 2021-08-28 NOTE — Discharge Instructions (Signed)
Your x-ray was normal.  I am concerned that you have a sinus/bronchitis infection.  Please start Augmentin twice daily.  Start prednisone in the morning for 4 days.  While taking this do not take NSAIDs including aspirin, ibuprofen/Advil, naproxen/Aleve.  You can use Tylenol, Mucinex, Flonase for symptom relief.  I have refilled your albuterol inhaler.  Use this every 4-6 hours as needed for shortness of breath.  If your symptoms or not improving please return for reevaluation.  If anything worsens and you develop high fever, worsening shortness of breath, nausea/vomiting interfering with oral intake, chest pain, shortness of breath you need to go to the emergency room.

## 2021-08-28 NOTE — ED Triage Notes (Signed)
Pt reports had cough since November and was seen here and her PCP for it. Took all medications that was given but still persistent. Reports that causing pain in head, left ear and back and keeping her up at night. Cough is worse when goes out into cold air.

## 2021-08-31 ENCOUNTER — Encounter: Payer: Self-pay | Admitting: Nurse Practitioner

## 2021-08-31 ENCOUNTER — Other Ambulatory Visit: Payer: Self-pay

## 2021-08-31 ENCOUNTER — Telehealth (INDEPENDENT_AMBULATORY_CARE_PROVIDER_SITE_OTHER): Payer: Medicaid Other | Admitting: Nurse Practitioner

## 2021-08-31 ENCOUNTER — Ambulatory Visit: Payer: Self-pay | Admitting: *Deleted

## 2021-08-31 DIAGNOSIS — H9202 Otalgia, left ear: Secondary | ICD-10-CM | POA: Diagnosis not present

## 2021-08-31 MED ORDER — CIPROFLOXACIN-DEXAMETHASONE 0.3-0.1 % OT SUSP: 4.0000 [drp] | Freq: Two times a day (BID) | OTIC | 0 refills | Status: DC

## 2021-08-31 NOTE — Telephone Encounter (Addendum)
° °  Pt stated she has ringing in her left ear stated she feels like something is in her ear. Pt stated this started last week with her cough she was checked at the ER and was told nothing was in her ear. Pt stated she is also experiencing headaches.  No available appointments. Pt requesting clinical advise  Called interpreter line and connected to [phone] and during call was disconnected . Will retry call due to system failure.     2nd attempt to contact patient and unable to provide with [place] interpreter at this time will try again later. 11:22 am.

## 2021-08-31 NOTE — Telephone Encounter (Signed)
Attempted to contact patient via interpreter line ID # [phone] to review sx ringing in ear. No answer , left voicemail to call clinic back at [phone].

## 2021-08-31 NOTE — Patient Instructions (Signed)
Left ear pain:  Continue current medications  Will try ciprodex ear drops  May need ENT referral if ear continues to have pain and fullness  Follow up:  Follow up if needed

## 2021-08-31 NOTE — Progress Notes (Signed)
Virtual Visit via Telephone Note  I connected with Michaela Moran on 08/31/21 at  2:40 PM EST by telephone and verified that I am speaking with the correct person using two identifiers.  Location: Patient: home Provider: office   I discussed the limitations, risks, security and privacy concerns of performing an evaluation and management service by telephone and the availability of in person appointments. I also discussed with the patient that there may be a patient responsible charge related to this service. The patient expressed understanding and agreed to proceed.   History of Present Illness:  Patient presents today through telephone visit using [place] interpreter for left ear pain.  Patient was just seen in the ED on 08/28/2021 for the same issue.  She was prescribed albuterol, Augmentin, prednisone.  She had already finished a course of azithromycin, Tessalon Perles, Flonase, and Promethazine DM which was prescribed by PCP.  Patient states that she still continues to have pain in her left ear was noted in ED visit summary that her left ear was injected.  She states that she is also having sinus congestion pressure and pain, chills, and blood-tinged sputum at times.  She states that she is feeling a little improved since starting the medications from the ED 2 days ago.  We discussed that she may need a referral to ENT if her ear is not feel better soon.  We will trial a round of Ciprodex eardrops. Denies f/c/s, n/v/d, hemoptysis, PND, chest pain or edema.      Observations/Objective:  Vitals with BMI 08/28/2021 07/24/2021 07/22/2021  Height - 5\' 7"  -  Weight - 195 lbs -  BMI - 50.27 -  Systolic 741 99 287  Diastolic 78 62 87  Pulse 75 103 89      Assessment and Plan:  Left ear pain:  Continue current medications  Will try ciprodex ear drops  May need ENT referral if ear continues to have pain and fullness  Follow up:  Follow up if needed    I discussed the assessment and  treatment plan with the patient. The patient was provided an opportunity to ask questions and all were answered. The patient agreed with the plan and demonstrated an understanding of the instructions.   The patient was advised to call back or seek an in-person evaluation if the symptoms worsen or if the condition fails to improve as anticipated.  I provided 23 minutes of non-face-to-face time during this encounter.   Fenton Foy, NP

## 2021-08-31 NOTE — Telephone Encounter (Signed)
Patient triaged with another Nurse. Virtual visit today .

## 2021-08-31 NOTE — Telephone Encounter (Signed)
°  Chief Complaint: L ear ringing Symptoms: ringing, pain, headache Frequency: over 1 week Pertinent Negatives: NA Disposition: [] ED /[] Urgent Care (no appt availability in office) / [x] Appointment(In office/virtual)/ Mobile Unit []  [facility]/ [] Home Care/ [] Refused Recommended Disposition  Additional Notes: Pt states her L ear feels like its humming and feels pain. Also has headache on L side. She states she feels like something is in her ear. Pt was provided with location and number to mobile unit d/t no appts in office.   Summary: Ringing in L ear   Pt stated she has ringing in her left ear stated she feels like something is in her ear. Pt stated this started last week with her cough she was checked at the ER and was told nothing was in her ear. Pt stated she is also experiencing headaches.  No available appointments. Pt requesting clinical advise.       Reason for Disposition  Symptoms only or mainly in 1 ear (unilateral tinnitus)  Answer Assessment - Initial Assessment Questions 1. DESCRIPTION: "Describe the sound you are hearing." (e.g., hissing, humming, pounding, ringing)     humming 2. LOCATION: "One or both ears?" If one, ask: "Which ear?"     L ear 3. SEVERITY: "How bad is it?"    - MILD - doesn't interfere with normal activities, only can hear in a quiet room    - MODERATE-SEVERE (Bothersome): interferes with work, school, sleep, or other activities      moderate 4. ONSET: "When did this begin?" "Did it start suddenly or come on gradually?"     Over a week  5. PATTERN: "Does this come and go, or has it been constant since it started?"     Comes and go 6. HEARING LOSS: "Is your hearing decreased?" (e.g., normal, decreased)       N 7. OTHER SYMPTOMS: "Do you have any other symptoms?" (e.g., dizziness, earache)     L headache and ear pain  Protocols used: Tinnitus-A-AH

## 2021-09-20 ENCOUNTER — Encounter: Payer: Self-pay | Admitting: Obstetrics and Gynecology

## 2021-09-20 ENCOUNTER — Other Ambulatory Visit: Payer: Self-pay

## 2021-09-20 ENCOUNTER — Ambulatory Visit (INDEPENDENT_AMBULATORY_CARE_PROVIDER_SITE_OTHER): Payer: Medicaid Other | Admitting: Obstetrics and Gynecology

## 2021-09-20 ENCOUNTER — Ambulatory Visit: Payer: Medicaid Other | Attending: Nurse Practitioner | Admitting: Nurse Practitioner

## 2021-09-20 ENCOUNTER — Encounter: Payer: Self-pay | Admitting: Nurse Practitioner

## 2021-09-20 VITALS — BP 109/80 | HR 80 | Wt 200.0 lb

## 2021-09-20 VITALS — BP 113/80 | HR 91 | Ht 67.0 in | Wt 204.4 lb

## 2021-09-20 DIAGNOSIS — K219 Gastro-esophageal reflux disease without esophagitis: Secondary | ICD-10-CM | POA: Diagnosis not present

## 2021-09-20 DIAGNOSIS — Z23 Encounter for immunization: Secondary | ICD-10-CM | POA: Diagnosis not present

## 2021-09-20 DIAGNOSIS — R300 Dysuria: Secondary | ICD-10-CM

## 2021-09-20 DIAGNOSIS — L719 Rosacea, unspecified: Secondary | ICD-10-CM | POA: Diagnosis not present

## 2021-09-20 DIAGNOSIS — G629 Polyneuropathy, unspecified: Secondary | ICD-10-CM

## 2021-09-20 DIAGNOSIS — Z0001 Encounter for general adult medical examination with abnormal findings: Secondary | ICD-10-CM | POA: Diagnosis not present

## 2021-09-20 DIAGNOSIS — R21 Rash and other nonspecific skin eruption: Secondary | ICD-10-CM

## 2021-09-20 DIAGNOSIS — R7303 Prediabetes: Secondary | ICD-10-CM

## 2021-09-20 DIAGNOSIS — Z0000 Encounter for general adult medical examination without abnormal findings: Secondary | ICD-10-CM

## 2021-09-20 HISTORY — DX: Dysuria: R30.0

## 2021-09-20 LAB — GLUCOSE, POCT (MANUAL RESULT ENTRY): POC Glucose: 107 mg/dl — AB (ref 70–99)

## 2021-09-20 LAB — POCT URINALYSIS DIPSTICK
Bilirubin, UA: NEGATIVE
Glucose, UA: NEGATIVE
Ketones, UA: NEGATIVE
Leukocytes, UA: NEGATIVE
Nitrite, UA: NEGATIVE
Protein, UA: NEGATIVE
Spec Grav, UA: 1.01 (ref 1.010–1.025)
Urobilinogen, UA: 0.2 E.U./dL
pH, UA: 6 (ref 5.0–8.0)

## 2021-09-20 LAB — POCT GLYCOSYLATED HEMOGLOBIN (HGB A1C): Hemoglobin A1C: 5.5 % (ref 4.0–5.6)

## 2021-09-20 MED ORDER — TRETINOIN 0.025 % EX CREA: TOPICAL_CREAM | Freq: Every day | CUTANEOUS | 1 refills | Status: DC

## 2021-09-20 MED ORDER — TRETINOIN 0.025 % EX CREA: TOPICAL_CREAM | Freq: Every day | CUTANEOUS | 0 refills | Status: DC

## 2021-09-20 MED ORDER — DAPSONE 5 % EX GEL: CUTANEOUS | 1 refills | Status: DC

## 2021-09-20 MED ORDER — PANTOPRAZOLE SODIUM 40 MG PO TBEC: 40.0000 mg | DELAYED_RELEASE_TABLET | Freq: Every day | ORAL | 3 refills | Status: DC

## 2021-09-20 MED ORDER — GABAPENTIN 300 MG PO CAPS: 300.0000 mg | ORAL_CAPSULE | Freq: Three times a day (TID) | ORAL | 3 refills | Status: DC

## 2021-09-20 MED ORDER — DAPSONE 5 % EX GEL: CUTANEOUS | 0 refills | Status: DC

## 2021-09-20 NOTE — Addendum Note (Signed)
Addended by: Barkley Bruns on: 09/20/2021 04:09 PM   Modules accepted: Orders

## 2021-09-20 NOTE — Progress Notes (Signed)
Ms Sweeden presents with UTI Sx. Some right side pain, dysuria and possible hematuria. Cycles are regular Sexual active without contraception H/O C section x 3 with known pelvic adhesive disease.  Pap smear UTD  PE AF VSS Lungs clear Heart RRR Abd soft + BS   A/P Possible UTI  Urine for culture. Tx as per culture results.

## 2021-09-20 NOTE — Progress Notes (Signed)
Assessment & Plan:  Michaela Moran was seen today for annual exam.  Diagnoses and all orders for this visit:  ANNUAL PHYSICAL EXAM  Prediabetes -     POCT glycosylated hemoglobin (Hb A1C) -     POCT glucose (manual entry)  Rosacea, acne -     Dapsone (ACZONE) 5 % topical gel; Apply topically 2 times per day -     tretinoin (RETIN-A) 0.025 % cream; Apply topically at bedtime.  Facial rash -     Lupus anticoagulant panel  Neuropathy -     gabapentin (NEURONTIN) 300 MG capsule; Take 1 capsule (300 mg total) by mouth 3 (three) times daily.  Serum calcium elevated -     CMP14+EGFR  GERD without esophagitis -     pantoprazole (PROTONIX) 40 MG tablet; Take 1 tablet (40 mg total) by mouth daily.    Patient has been counseled on age-appropriate routine health concerns for screening and prevention. These are reviewed and up-to-date. Referrals have been placed accordingly. Immunizations are up-to-date or declined.    Subjective:   Chief Complaint  Patient presents with   Annual Exam   HPI Michaela Moran 41 y.o. female presents to office today for annual physical She has a past medical history of Acid reflux, Female circumcision, Hemorrhoid, History of positive PPD, Hypercholesteremia, Previous cesarean delivery, antepartum condition or complication (4/66/5993), and S/P repeat low transverse C-section (06/27/2016).   GERD Only takes PPI as needed. Pain is aggravated by eating spicy foods. She denies diarrhea or constipation.   Rosacea: Patient complains of increasing redness on face. Patient has not noted papules or pustules.  Therapy thus far has included RetinA 0.025 % gel qhs 20 min after washing and aczone topical gel.     Review of Systems  Constitutional:  Negative for fever, malaise/fatigue and weight loss.  HENT: Negative.  Negative for nosebleeds.   Eyes: Negative.  Negative for blurred vision, double vision and photophobia.  Respiratory: Negative.  Negative for cough and  shortness of breath.   Cardiovascular: Negative.  Negative for chest pain, palpitations and leg swelling.  Gastrointestinal:  Positive for heartburn. Negative for abdominal pain, blood in stool, constipation, diarrhea, melena, nausea and vomiting.  Genitourinary: Negative.   Musculoskeletal: Negative.  Negative for myalgias.  Skin:  Positive for rash. Negative for itching.  Neurological:  Positive for sensory change. Negative for dizziness, focal weakness, seizures and headaches.  Endo/Heme/Allergies: Negative.   Psychiatric/Behavioral: Negative.  Negative for suicidal ideas.    Past Medical History:  Diagnosis Date   Acid reflux    Female circumcision    Hemorrhoid    History of positive PPD    neg CXR   Hypercholesteremia    Previous cesarean delivery, antepartum condition or complication 5/70/1779   History of C/S x 2     S/P repeat low transverse C-section 06/27/2016    Past Surgical History:  Procedure Laterality Date   ARTERY BIOPSY Right 02/17/2021   Procedure: RIGHT TEMPORAL ARTERY BIOPSY;  Surgeon: Angelia Mould, MD;  Location: [facility];  Service: Vascular;  Laterality: Right;   [place] N/A 01/02/2013   Procedure: CESAREAN SECTION;  Surgeon: Donnamae Jude, MD;  Location: [facility];  Service: Obstetrics;  Laterality: N/A;  Incidental Cyystotomy   CESAREAN SECTION N/A 06/26/2016   Procedure: CESAREAN SECTION;  Surgeon: Aletha Halim, MD;  Location: [facility];  Service: Obstetrics;  Laterality: N/A;   CYSTOTOMY  with repair at 2014 repeat c-section    Family History  Problem Relation Age of Onset   Diabetes Mother    Diabetes Sister    Diabetes Brother    Hypertension Brother    Diabetes Sister    Diabetes Sister    Autism Son    Healthy Son    Healthy Daughter    Other Neg Hx     Social History Reviewed with no changes to be made today.   Outpatient Medications Prior to Visit  Medication Sig Dispense Refill    Prenatal Vit-Fe Fumarate-FA (PRENATAL VITAMINS) 28-0.8 MG TABS Take 1 tablet by mouth daily. 30 tablet 5   gabapentin (NEURONTIN) 300 MG capsule Take 1 capsule (300 mg total) by mouth 3 (three) times daily. 90 capsule 3   No facility-administered medications prior to visit.    Allergies  Allergen Reactions   Other Swelling    Allergic to peas.       Objective:    BP 113/80    Pulse 91    Ht _0  (1.702 m)    Wt 204 lb 6 oz (92.7 kg)    LMP 08/22/2021    SpO2 99%    BMI 32.01 kg/m  Wt Readings from Last 3 Encounters:  09/20/21 204 lb 6 oz (92.7 kg)  09/20/21 200 lb (90.7 kg)  07/24/21 195 lb (88.5 kg)    Physical Exam Vitals and nursing note reviewed.  Constitutional:      Appearance: She is well-developed.  HENT:     Head: Normocephalic and atraumatic.     Right Ear: Hearing, tympanic membrane, ear canal and external ear normal.     Left Ear: Hearing, tympanic membrane, ear canal and external ear normal.     Nose: Nose normal.     Mouth/Throat:     Lips: Pink.     Mouth: Mucous membranes are moist.     Dentition: No dental tenderness, gingival swelling, dental caries, dental abscesses or gum lesions.  Cardiovascular:     Rate and Rhythm: Normal rate and regular rhythm.     Heart sounds: Normal heart sounds. No murmur heard.   No friction rub. No gallop.  Pulmonary:     Effort: Pulmonary effort is normal. No tachypnea or respiratory distress.     Breath sounds: Normal breath sounds. No decreased breath sounds, wheezing, rhonchi or rales.  Chest:     Chest wall: No tenderness.  Abdominal:     General: Bowel sounds are normal.     Palpations: Abdomen is soft.  Musculoskeletal:        General: Normal range of motion.     Cervical back: Normal range of motion.  Skin:    General: Skin is warm and dry.  Neurological:     Mental Status: She is alert and oriented to person, place, and time.     Motor: Motor function is intact.     Coordination: Coordination is intact.  Coordination normal.     Gait: Gait is intact.     Deep Tendon Reflexes:     Reflex Scores:      Patellar reflexes are 1+ on the right side and 1+ on the left side. Psychiatric:        Behavior: Behavior normal. Behavior is cooperative.        Thought Content: Thought content normal.        Judgment: Judgment normal.         Patient has been counseled extensively  about nutrition and exercise as well as the importance of adherence with medications and regular follow-up. The patient was given clear instructions to go to ER or return to medical center if symptoms don't improve, worsen or new problems develop. The patient verbalized understanding.   Follow-up: Return if symptoms worsen or fail to improve.   Gildardo Pounds, FNP-BC [facility], [place]   09/20/2021, 3:51 PM

## 2021-09-20 NOTE — Progress Notes (Signed)
Pt states she has been noticing blood in her urine, pt is having right sided pain.  Pt is having pain with urination.

## 2021-09-21 ENCOUNTER — Telehealth: Payer: Self-pay | Admitting: Nurse Practitioner

## 2021-09-21 NOTE — Telephone Encounter (Signed)
Copied from [place] [id number]. Topic: Quick Communication - Rx Refill/Question >> Sep 20, 2021  4:24 PM Erick Blinks wrote: Pt called and needs statement of medical necessity at her pharmacy for the medications sent in today. Please advise, she needs this so her medicaid will cover it.

## 2021-09-22 LAB — CMP14+EGFR
ALT: 19 IU/L (ref 0–32)
AST: 16 IU/L (ref 0–40)
Albumin/Globulin Ratio: 1.6 (ref 1.2–2.2)
Albumin: 4.2 g/dL (ref 3.8–4.8)
Alkaline Phosphatase: 85 IU/L (ref 44–121)
BUN/Creatinine Ratio: 11 (ref 9–23)
BUN: 9 mg/dL (ref 6–24)
Bilirubin Total: 0.3 mg/dL (ref 0.0–1.2)
CO2: 22 mmol/L (ref 20–29)
Calcium: 9.3 mg/dL (ref 8.7–10.2)
Chloride: 101 mmol/L (ref 96–106)
Creatinine, Ser: 0.82 mg/dL (ref 0.57–1.00)
Globulin, Total: 2.7 g/dL (ref 1.5–4.5)
Glucose: 111 mg/dL — ABNORMAL HIGH (ref 70–99)
Potassium: 3.6 mmol/L (ref 3.5–5.2)
Sodium: 137 mmol/L (ref 134–144)
Total Protein: 6.9 g/dL (ref 6.0–8.5)
eGFR: 92 mL/min/{1.73_m2} (ref >59)

## 2021-09-22 LAB — URINE CULTURE

## 2021-09-22 LAB — LUPUS ANTICOAGULANT PANEL
Dilute Viper Venom Time: 30.4 s (ref 0.0–47.0)
PTT Lupus Anticoagulant: 33.2 s (ref 0.0–51.9)

## 2021-09-26 ENCOUNTER — Ambulatory Visit: Payer: Self-pay | Admitting: *Deleted

## 2021-09-26 ENCOUNTER — Telehealth: Payer: Self-pay

## 2021-09-26 ENCOUNTER — Encounter: Payer: Self-pay | Admitting: *Deleted

## 2021-09-26 NOTE — Telephone Encounter (Signed)
°  Chief Complaint: requesting paper work to be completed for limitations at work for standing  Symptoms: low back right hip pain ., afraid she will fall with prolonged standing  Frequency: na Pertinent Negatives: Patient denies na Disposition: [] ED /[] Urgent Care (no appt availability in office) / [] Appointment(In office/virtual)/ []  [facility]/ [] Home Care/ [] Refused Recommended Disposition /[] [facility]/ [x]  Follow-up with PCP Additional Notes:   Requesting noted from PCP for work . Would like to drop off paper work today or tomorrow. Instructed patient if pain worsens go to ED for evaluation. Patient afraid she will fall standing at work.

## 2021-09-26 NOTE — Telephone Encounter (Signed)
Called with assistance of somali interpreter. No answer. LVM

## 2021-09-26 NOTE — Telephone Encounter (Signed)
Reason for Disposition  Back pain  Answer Assessment - Initial Assessment Questions 1. ONSET: "When did the pain begin?"      On going issue requesting paper work to be completed for limitations at work sitting in chair  2. LOCATION: "Where does it hurt?" (upper, mid or lower back)     Low back right hip  3. SEVERITY: "How bad is the pain?"  (e.g., Scale 1-10; mild, moderate, or severe)   - MILD (1-3): doesn't interfere with normal activities    - MODERATE (4-7): interferes with normal activities or awakens from sleep    - SEVERE (8-10): excruciating pain, unable to do any normal activities      Feels like she will fall is standing too long  4. PATTERN: "Is the pain constant?" (e.g., yes, no; constant, intermittent)      na 5. RADIATION: "Does the pain shoot into your legs or elsewhere?"     na 6. CAUSE:  "What do you think is causing the back pain?"      na 7. BACK OVERUSE:  "Any recent lifting of heavy objects, strenuous work or exercise?"     na 8. MEDICATIONS: "What have you taken so far for the pain?" (e.g., nothing, acetaminophen, NSAIDS)     na 9. NEUROLOGIC SYMPTOMS: "Do you have any weakness, numbness, or problems with bowel/bladder control?"     na 10. OTHER SYMPTOMS: "Do you have any other symptoms?" (e.g., fever, abdominal pain, burning with urination, blood in urine)       Feels like she will fall if standing too long  11. PREGNANCY: "Is there any chance you are pregnant?" (e.g., yes, no; LMP)       na  Protocols used: Back Pain-A-AH

## 2021-09-26 NOTE — Telephone Encounter (Signed)
Left message to return call to our office.  With the help of translator [place] # [phone]

## 2021-09-26 NOTE — Telephone Encounter (Signed)
Copied from [place] [id number]. Topic: General - Other >> Sep 26, 2021 12:00 PM Alanda Slim E wrote: Reason for CRM: Pt is still having issues standing at work and was advised by her employer to bring paperwork to pcp to have filled out to approve pt to be able to sit or use a chair at work/  pt stated she will bring paperwork by the office / pt was seen last week /please advise

## 2021-09-26 NOTE — Telephone Encounter (Signed)
This encounter was created in error - please disregard.

## 2021-09-26 NOTE — Telephone Encounter (Signed)
Patient calling back to request a note be sent to her via email : [email or web address]. requesting work note to approve temporary limitations to standing at work. Patient' s job is requiring a note prior to her return to work . Please advise . Patient is very distrait and fearful of losing her job tomorrow. Patient requesting a call back if possible today when work note can be sent to her via email / hard copy. 3 rd call today for assistance from patient.  Requested patient to bring in required paper work for PCP to complete. Patient requesting a call back today please.

## 2021-09-27 ENCOUNTER — Encounter: Payer: Self-pay | Admitting: Nurse Practitioner

## 2021-09-27 NOTE — Telephone Encounter (Signed)
Patient called in asking to speak to [facility] in regards to her needs while Dr id completing the paper work for her so she will not loose her job please call patient at Ph# [phone]

## 2021-09-27 NOTE — Telephone Encounter (Signed)
Provider has 7-10 days to fill out paperwork will call when paperwork is ready.

## 2021-09-27 NOTE — Telephone Encounter (Signed)
I have attempted to call her twice. I can not keep her out of work. There is no reason she can not work. She should not stay out of work as I have not written her out. Please let her know this. Her calls are going straight to voicemail.

## 2021-09-27 NOTE — Telephone Encounter (Signed)
Patient state that she need a note for her job stating that she can not work until Dr fill out her paper work starting from 09/26/21 through the time she get her paper work back ask if it can be emailed to her [email or web address]

## 2021-09-28 ENCOUNTER — Other Ambulatory Visit: Payer: Self-pay | Admitting: Nurse Practitioner

## 2021-09-28 DIAGNOSIS — G8929 Other chronic pain: Secondary | ICD-10-CM

## 2021-09-28 DIAGNOSIS — M5416 Radiculopathy, lumbar region: Secondary | ICD-10-CM

## 2021-09-28 NOTE — Telephone Encounter (Signed)
Pt calling back to advise her work called and says the work note is not good:  they do not except.  They will not allow pt to come back at all. She is asking if the dr will call [phone]. Ask for manager  She needs paperwork filled out prior to going back to work.

## 2021-09-28 NOTE — Telephone Encounter (Signed)
After speaking with Michaela Moran.She states she can not afford to lose her job. She is requesting a work note stating she can return to work with no restrictions. She states she can not risk her employer not being able to accommodate her.

## 2021-09-28 NOTE — Telephone Encounter (Signed)
Please advise-   Patient and husband explains that employer request  a note from provider / patient stating she has no restrictions and she can walk.   She and husband explain that she has to sit occasionally d/t leg pain and swelling. There is a form to complete. Will take 7-10 days to complete by PCP.  __________________________________  Michaela Moran to employer  Request for accommodation specific restrictions on assessment form  If employer is not able to grant accommodation Will have to go under a continuous leave which is a leave of absence for certain amount of time to see if condition improves.   __________________________________ Accomodation assessment form/ due February 7th.

## 2021-09-29 ENCOUNTER — Telehealth: Payer: Self-pay | Admitting: Nurse Practitioner

## 2021-09-29 NOTE — Telephone Encounter (Signed)
Copied from [place] [id number]. Topic: General - Other >> Sep 27, 2021  9:25 AM Yvette Rack wrote: Reason for CRM: Pt stated she had a message to call the office. Pt requests call back.

## 2021-09-30 ENCOUNTER — Other Ambulatory Visit: Payer: Self-pay | Admitting: Nurse Practitioner

## 2021-09-30 DIAGNOSIS — G8929 Other chronic pain: Secondary | ICD-10-CM

## 2021-09-30 DIAGNOSIS — M5416 Radiculopathy, lumbar region: Secondary | ICD-10-CM

## 2021-10-02 ENCOUNTER — Telehealth: Payer: Self-pay

## 2021-10-02 ENCOUNTER — Other Ambulatory Visit: Payer: Self-pay

## 2021-10-02 ENCOUNTER — Telehealth: Payer: Self-pay | Admitting: Nurse Practitioner

## 2021-10-02 NOTE — Telephone Encounter (Signed)
Thanks [employer] No she will need to follow up with dermatology for a refill of this if it is requiring a prior auth. I am not the initial prescriber and was only trying to complimentary fill.

## 2021-10-02 NOTE — Telephone Encounter (Signed)
PA was received for Aczone which is non-preferred.  Would Duac gel, Differin gel or Epiduo gel be an option for patient?

## 2021-10-02 NOTE — Telephone Encounter (Signed)
Left message to return call to our office.  Help help of [employer] # [phone]

## 2021-10-02 NOTE — Telephone Encounter (Signed)
Copied from [place] [id number]. Topic: General - Other >> Oct 02, 2021  8:34 AM Alanda Slim E wrote: Reason for CRM: Pt called and stated that she called Dr. Julaine Hua and he will not see her until Zelda sends him information / notes about her knee issue / please advise asap due to pt being out of work

## 2021-10-02 NOTE — Telephone Encounter (Signed)
Pt has spoke to pcp and has a follow up with ortho.

## 2021-10-03 ENCOUNTER — Telehealth: Payer: Self-pay

## 2021-10-03 NOTE — Telephone Encounter (Signed)
Patient called in, advised urine culture was negative. Pt wants to schedule an appt, transferred to scheduler.

## 2021-10-03 NOTE — Telephone Encounter (Signed)
Returned call, no answer, left vm 

## 2021-10-04 ENCOUNTER — Other Ambulatory Visit: Payer: Self-pay

## 2021-10-06 ENCOUNTER — Telehealth: Payer: Self-pay | Admitting: Orthopaedic Surgery

## 2021-10-06 NOTE — Telephone Encounter (Signed)
Received $25.00 cash,medical records release form and Disability paperwork from patient   Forwarding to [facility] today

## 2021-10-10 ENCOUNTER — Encounter: Payer: Self-pay | Admitting: Orthopaedic Surgery

## 2021-10-10 ENCOUNTER — Ambulatory Visit (INDEPENDENT_AMBULATORY_CARE_PROVIDER_SITE_OTHER): Payer: Medicaid Other

## 2021-10-10 ENCOUNTER — Ambulatory Visit (INDEPENDENT_AMBULATORY_CARE_PROVIDER_SITE_OTHER): Payer: Medicaid Other | Admitting: Orthopaedic Surgery

## 2021-10-10 ENCOUNTER — Other Ambulatory Visit: Payer: Self-pay

## 2021-10-10 DIAGNOSIS — M1711 Unilateral primary osteoarthritis, right knee: Secondary | ICD-10-CM | POA: Diagnosis not present

## 2021-10-10 MED ORDER — LIDOCAINE HCL 1 % IJ SOLN
2.0000 mL | INTRAMUSCULAR | Status: AC | PRN
  Administered 2021-10-10: 17:00:00 2 mL

## 2021-10-10 MED ORDER — BUPIVACAINE HCL 0.5 % IJ SOLN
2.0000 mL | INTRAMUSCULAR | Status: AC | PRN
  Administered 2021-10-10: 17:00:00 2 mL via INTRA_ARTICULAR

## 2021-10-10 MED ORDER — METHYLPREDNISOLONE ACETATE 40 MG/ML IJ SUSP
40.0000 mg | INTRAMUSCULAR | Status: AC | PRN
  Administered 2021-10-10: 17:00:00 40 mg via INTRA_ARTICULAR

## 2021-10-10 NOTE — Progress Notes (Signed)
Office Visit Note   [name]           Date of Birth: 1981-06-04           MRN: [id number] Visit Date: 10/10/2021              Requested by: Gildardo Pounds, NP [place],  [place] [address] PCP: Gildardo Pounds, NP   Assessment & Plan: Visit Diagnoses:  1. Primary osteoarthritis of right knee     Plan: Patient returns today for chronic right knee pain.  Interpreter present today.  She has trouble with prolonged standing and walking especially at work.  She endorses swelling that is worse with activity.  Examination of the right knee shows no joint effusion.  Moderate pain with range of motion.  Collaterals and cruciates are stable.  The x-rays did not show any significant progression of her osteoarthritis.  Impression is right knee osteoarthritis exacerbation.  Cortisone injection administered today.  Work note was provided today.  I think it is reasonable to allow 20-minute break every 3-4 hours of standing at work.  If she continues to have ongoing symptoms we may have to refer the patient back to Dr. Colon Flattery for further evaluation and treatment.  Follow-Up Instructions: No follow-ups on file.   Orders:  Orders Placed This Encounter  Procedures   XR KNEE 3 VIEW RIGHT   No orders of the defined types were placed in this encounter.     Procedures: Large Joint Inj: R knee on 10/10/2021 4:34 PM Indications: pain Details: 22 G needle  Arthrogram: No  Medications: 40 mg methylPREDNISolone acetate 40 MG/ML; 2 mL lidocaine 1 %; 2 mL bupivacaine 0.5 % Consent was given by the patient. Patient was prepped and draped in the usual sterile fashion.      Clinical Data: No additional findings.   Subjective: Chief Complaint  Patient presents with   Right Knee - Pain    HPI  Review of Systems   Objective: Vital Signs: There were no vitals taken for this visit.  Physical Exam  Ortho Exam  Specialty Comments:  No specialty comments  available.  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 04/28/2016   Language barrier, cultural differences 04/28/2016   Past Medical History:  Diagnosis Date   Acid reflux    Female circumcision    Hemorrhoid    History of positive PPD    neg CXR   Hypercholesteremia    Previous cesarean delivery, antepartum condition or complication 04/05/9469   History of C/S x 2     S/P repeat low transverse C-section 06/27/2016    Family History  Problem Relation Age of Onset   Diabetes Mother    Diabetes Sister    Diabetes Brother    Hypertension Brother    Diabetes Sister    Diabetes Sister    Autism Son    Healthy Son    Healthy Daughter    Other Neg Hx     Past Surgical History:  Procedure Laterality Date   ARTERY BIOPSY Right 02/17/2021   Procedure: RIGHT TEMPORAL ARTERY BIOPSY;  Surgeon: Angelia Mould, MD;  Location: [facility];  Service: Vascular;  Laterality: Right;   CESAREAN SECTION     CESAREAN SECTION N/A 01/02/2013   Procedure: CESAREAN SECTION;  Surgeon: Donnamae Jude, MD;  Location: [facility];  Service: Obstetrics;  Laterality: N/A;  Incidental Cyystotomy   CESAREAN SECTION N/A 06/26/2016   Procedure: CESAREAN SECTION;  Surgeon: Aletha Halim, MD;  Location: [facility];  Service: Obstetrics;  Laterality: N/A;   CYSTOTOMY     with repair at 2014 repeat c-section   Social History   Occupational History   Not on file  Tobacco Use   Smoking status: Never   Smokeless tobacco: Never  Vaping Use   Vaping Use: Never used  Substance and Sexual Activity   Alcohol use: No   Drug use: No   Sexual activity: Yes    Birth control/protection:  None

## 2021-10-12 NOTE — Progress Notes (Deleted)
Office Visit Note  [name]             Date of Birth: 05/13/1981           MRN: [id number]             PCP: Gildardo Pounds, NP Referring: Gildardo Pounds, NP Visit Date: 10/16/2021 Occupation: _0 @  Subjective:  No chief complaint on file.   History of Present Illness: Michaela Moran is a 41 y.o. female ***   Activities of Daily Living:  Patient reports morning stiffness for *** {minute/hour:19697}.   Patient {ACTIONS;DENIES/REPORTS:21021675::"Denies"} nocturnal pain.  Difficulty dressing/grooming: {ACTIONS;DENIES/REPORTS:21021675::"Denies"} Difficulty climbing stairs: {ACTIONS;DENIES/REPORTS:21021675::"Denies"} Difficulty getting out of chair: {ACTIONS;DENIES/REPORTS:21021675::"Denies"} Difficulty using hands for taps, buttons, cutlery, and/or writing: {ACTIONS;DENIES/REPORTS:21021675::"Denies"}  No Rheumatology ROS completed.   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/28/2016   Language barrier, cultural differences 04/28/2016    Past Medical History:  Diagnosis Date   Acid reflux    Female circumcision    Hemorrhoid    History of positive PPD    neg CXR   Hypercholesteremia    Previous cesarean delivery, antepartum condition or complication 9/92/4268   History of C/S x 2     S/P repeat low transverse C-section 06/27/2016    Family History  Problem Relation Age of Onset   Diabetes Mother    Diabetes Sister    Diabetes Brother    Hypertension Brother    Diabetes Sister    Diabetes Sister    Autism Son    Healthy Son    Healthy Daughter    Other Neg Hx    Past Surgical History:  Procedure Laterality Date   ARTERY BIOPSY Right 02/17/2021   Procedure: RIGHT TEMPORAL ARTERY BIOPSY;  Surgeon: Angelia Mould,  MD;  Location: [facility];  Service: Vascular;  Laterality: Right;   [place] N/A 01/02/2013   Procedure: CESAREAN SECTION;  Surgeon: Donnamae Jude, MD;  Location: [facility];  Service: Obstetrics;  Laterality: N/A;  Incidental Cyystotomy   CESAREAN SECTION N/A 06/26/2016   Procedure: CESAREAN SECTION;  Surgeon: Aletha Halim, MD;  Location: [facility];  Service: Obstetrics;  Laterality: N/A;   CYSTOTOMY     with repair at 2014 repeat c-section   Social History   Social History Narrative   Not on file   Immunization History  Administered Date(s) Administered   Influenza,inj,Quad PF,6+ Mos 06/28/2016, 08/30/2020, 09/20/2021   PFIZER(Purple Top)SARS-COV-2 Vaccination 01/26/2020, 02/16/2020     Objective: Vital Signs: There were no vitals taken for this visit.   Physical Exam   Musculoskeletal Exam: ***  CDAI Exam: CDAI Score: -- Patient Global: --; Provider Global: -- Swollen: --; Tender: -- Joint Exam 10/16/2021   No joint exam has been documented for this visit   There is currently no information documented on the homunculus. Go to the Rheumatology activity and complete the homunculus joint exam.  Investigation: No additional findings.  Imaging: XR KNEE 3 VIEW RIGHT  Result Date: 10/10/2021 No significant degenerative changes   Recent Labs: Lab Results  Component Value Date   WBC 4.9 06/03/2021   HGB 13.7 06/03/2021   PLT 316 06/03/2021   NA 137 09/20/2021  K 3.6 09/20/2021   CL 101 09/20/2021   CO2 22 09/20/2021   GLUCOSE 111 (H) 09/20/2021   BUN 9 09/20/2021   CREATININE 0.82 09/20/2021   BILITOT 0.3 09/20/2021   ALKPHOS 85 09/20/2021   AST 16 09/20/2021   ALT 19 09/20/2021   PROT 6.9 09/20/2021   ALBUMIN 4.2 09/20/2021   CALCIUM 9.3 09/20/2021   GFRAA 127 05/03/2020   December 09, 2019 anti-CCP negative, _0 eta negative, HLA-B27 negative   06/18/19: ANA 1:40 cytoplasmic, RF<14, ESR 14, uric acid 4.4. Speciality  Comments: No specialty comments available.  Procedures:  No procedures performed Allergies: Other   Assessment / Plan:     Visit Diagnoses: No diagnosis found.  Orders: No orders of the defined types were placed in this encounter.  No orders of the defined types were placed in this encounter.   Face-to-face time spent with patient was *** minutes. Greater than 50% of time was spent in counseling and coordination of care.  Follow-Up Instructions: No follow-ups on file.   Bo Merino, MD  Note - This record has been created using Editor, commissioning.  Chart creation errors have been sought, but may not always  have been located. Such creation errors do not reflect on  the standard of medical care.

## 2021-10-16 ENCOUNTER — Ambulatory Visit: Payer: Medicaid Other | Admitting: Rheumatology

## 2021-10-16 DIAGNOSIS — N736 Female pelvic peritoneal adhesions (postinfective): Secondary | ICD-10-CM

## 2021-10-16 DIAGNOSIS — E559 Vitamin D deficiency, unspecified: Secondary | ICD-10-CM

## 2021-10-16 DIAGNOSIS — G8929 Other chronic pain: Secondary | ICD-10-CM

## 2021-10-16 DIAGNOSIS — M5136 Other intervertebral disc degeneration, lumbar region: Secondary | ICD-10-CM

## 2021-10-16 DIAGNOSIS — M25461 Effusion, right knee: Secondary | ICD-10-CM

## 2021-10-16 DIAGNOSIS — Z603 Acculturation difficulty: Secondary | ICD-10-CM

## 2021-11-06 ENCOUNTER — Telehealth: Payer: Self-pay

## 2021-11-06 NOTE — Telephone Encounter (Signed)
Copied from [place] [id number]. Topic: General - Inquiry >> Nov 06, 2021  4:09 PM Greggory Keen D wrote: Reason for CRM: Pt called saying the Dapsone cream prescription is a lot of money and th medicaid will nt pay.  I think they are wanting something else sent to the pharmacy  CB#  [phone]

## 2021-11-08 ENCOUNTER — Telehealth: Payer: Self-pay

## 2021-11-08 ENCOUNTER — Other Ambulatory Visit: Payer: Self-pay | Admitting: Nurse Practitioner

## 2021-11-08 ENCOUNTER — Ambulatory Visit: Payer: Self-pay | Admitting: *Deleted

## 2021-11-08 DIAGNOSIS — L719 Rosacea, unspecified: Secondary | ICD-10-CM

## 2021-11-08 DIAGNOSIS — R21 Rash and other nonspecific skin eruption: Secondary | ICD-10-CM

## 2021-11-08 NOTE — Telephone Encounter (Signed)
°  Chief Complaint: coughing, headache and sore throat.  Chest hurts when she coughs.    Also needing a note to the [facility] [address] for the $400 face cream.   They need a note from the dr saying the facial cream is necessary.  The Dapsone Cream.    Symptoms: coughing, headache and sore throat Frequency: Started last night feeling bad. Pertinent Negatives: Patient denies  Disposition: [] ED /[] Urgent Care (no appt availability in office) / Appointment(In office/virtual)/ []  [facility]/ [] Home Care/ [] Refused Recommended Disposition /[] [facility]/ []  Follow-up with PCP Additional Notes: Appt made for 11/14/2021 at 2:10 by Selina Cooley at [facility] for the URI symptoms.  Interpreter was Alanson [id number].

## 2021-11-08 NOTE — Telephone Encounter (Signed)
Reason for Disposition  [1] Sore throat with cough/cold symptoms AND [2] present > 5 days  Answer Assessment - Initial Assessment Questions 1. ONSET: "When did the throat start hurting?" (Hours or days ago)      Pt calling in with [place] interpreter. [facility] Wrote for 4 medications.   The last medication is $400.   Face cream.   It's $400 unless my dr writes that it's necessary.    In Jan. Appt the name is there for the face cream.     [facility] [address]  Is [facility] the [facility] is necessary.    I took my son to the ED and now I think I'm sick from him.   My throat is itching and I get sick very easily.   I want an antibiotic.  My throat is sore, my head hurts.  I'm coughing.  None of the medications over the counter work for me.   I want an antibiotic.   Last night I felt like something from my stomach is suffocating me.  When I sleep elevated I cough a lot.   When I put 2 pillows together the coughing stops.        2. SEVERITY: "How bad is the sore throat?" (Scale 1-10; mild, moderate or severe)   - MILD (1-3):  doesn't interfere with eating or normal activities   - MODERATE (4-7): interferes with eating some solids and normal activities   - SEVERE (8-10):  excruciating pain, interferes with most normal activities   - SEVERE DYSPHAGIA: can't swallow liquids, drooling     *No Answer* 3. STREP EXPOSURE: "Has there been any exposure to strep within the past week?" If Yes, ask: "What type of contact occurred?"      *No Answer* 4.  VIRAL SYMPTOMS: "Are there any symptoms of a cold, such as a runny nose, cough, hoarse voice or red eyes?"      Sore throat, headache 5. FEVER: "Do you have a fever?" If Yes, ask: "What is your temperature, how was it measured, and when did it start?"     *No Answer* 6. PUS ON THE TONSILS: "Is there pus on the tonsils in the back of your throat?"     *No Answer* 7. OTHER SYMPTOMS: "Do you have any other symptoms?" (e.g., difficulty breathing,  headache, rash)     *No Answer* 8. PREGNANCY: "Is there any chance you are pregnant?" "When was your last menstrual period?"     *No Answer*  Protocols used: Sore Throat-A-AH

## 2021-11-08 NOTE — Telephone Encounter (Signed)
Hey Michaela Moran :) I already prescribed the retin a gel in January. Can you see if she picked it up?

## 2021-11-08 NOTE — Telephone Encounter (Signed)
That is what she requested. I have placed a dermatology referral at this time as I do not feel comfortable prescribing anything else for her skin

## 2021-11-08 NOTE — Telephone Encounter (Signed)
Pt called in asking if the medicaiton Dapsone (ACZONE) 5 % topical gel is necessary becaue it cost 400.00. Please called bac. She speaks somalian

## 2021-11-08 NOTE — Telephone Encounter (Signed)
Pcp has put in referral to Dermatology to get the cream.

## 2021-11-08 NOTE — Telephone Encounter (Signed)
Left message to return call to our office.  With help of interpretor Leilani Merl # [phone]

## 2021-11-08 NOTE — Telephone Encounter (Signed)
Sherrill friend. Medicaid will not cover Aczone gel. Covered alternatives are as follows:   -Epiduo -Differin -Clindamycin-benzoyl peroxide gel -Cleocin-T solution -Erythromycin solution -Erythromycin-benzoyl peroxide -Retin-A cream   If appropriate, are you able to send one of these to patient's pharmacy?

## 2021-11-09 NOTE — Telephone Encounter (Signed)
Oh goodness, I see that now. Called and confirmed that she did not get this. Even though it was covered, it required a PA and she never let anyone know this. We never got anything for a PA for the Retin A gel.

## 2021-11-10 ENCOUNTER — Other Ambulatory Visit: Payer: Self-pay

## 2021-11-13 ENCOUNTER — Other Ambulatory Visit: Payer: Self-pay | Admitting: Nurse Practitioner

## 2021-11-13 DIAGNOSIS — L719 Rosacea, unspecified: Secondary | ICD-10-CM

## 2021-11-13 MED ORDER — TRETINOIN 0.025 % EX CREA: TOPICAL_CREAM | Freq: Every day | CUTANEOUS | 1 refills | Status: DC

## 2021-11-14 ENCOUNTER — Ambulatory Visit: Payer: Medicaid Other | Attending: Nurse Practitioner | Admitting: Nurse Practitioner

## 2021-11-14 ENCOUNTER — Encounter: Payer: Self-pay | Admitting: Nurse Practitioner

## 2021-11-14 DIAGNOSIS — R053 Chronic cough: Secondary | ICD-10-CM

## 2021-11-14 MED ORDER — PANTOPRAZOLE SODIUM 40 MG PO TBEC: 40.0000 mg | DELAYED_RELEASE_TABLET | Freq: Every day | ORAL | 3 refills | Status: DC

## 2021-11-14 MED ORDER — MOMETASONE FURO-FORMOTEROL FUM 100-5 MCG/ACT IN AERO: 2.0000 | INHALATION_SPRAY | Freq: Two times a day (BID) | RESPIRATORY_TRACT | 3 refills | Status: DC

## 2021-11-14 MED ORDER — LORATADINE 10 MG PO TABS: 10.0000 mg | ORAL_TABLET | Freq: Every day | ORAL | 11 refills | Status: DC

## 2021-11-14 NOTE — Progress Notes (Signed)
Virtual Visit via Telephone Note Due to national recommendations of social distancing due to [place] 19, telehealth visit is felt to be most appropriate for this patient at this time.  I discussed the limitations, risks, security and privacy concerns of performing an evaluation and management service by telephone and the availability of in person appointments. I also discussed with the patient that there may be a patient responsible charge related to this service. The patient expressed understanding and agreed to proceed.    I connected with Michaela Moran on 11/14/21  at   2:10 PM EST  EDT by telephone and verified that I am speaking with the correct person using two identifiers.  Location of Patient: Private Residence   Location of Provider: [place] [facility] [place] participating in Telemedicine visit: Geryl Rankins FNP-BC [name]    History of Present Illness: Telemedicine visit for: URI Patient complains of symptoms of a URI. Symptoms include cough. Onset of symptoms was a few months ago, unchanged since that time. She also c/o cough described as productive of mucoid sputum for the past several months .  She is drinking plenty of fluids. Evaluation to date: CXR: NEGATIVE. Treatment to date: antibiotics and prednisone as well as PPI and inhaler which she is not taking as prescribed .     Past Medical History:  Diagnosis Date   Acid reflux    Female circumcision    Hemorrhoid    History of positive PPD    neg CXR   Hypercholesteremia    Previous cesarean delivery, antepartum condition or complication 3/76/2831   History of C/S x 2     S/P repeat low transverse C-section 06/27/2016    Past Surgical History:  Procedure Laterality Date   ARTERY BIOPSY Right 02/17/2021   Procedure: RIGHT TEMPORAL ARTERY BIOPSY;  Surgeon: Angelia Mould, MD;  Location: [facility];  Service: Vascular;  Laterality: Right;   Hernandez N/A  01/02/2013   Procedure: CESAREAN SECTION;  Surgeon: Donnamae Jude, MD;  Location: [facility];  Service: Obstetrics;  Laterality: N/A;  Incidental Cyystotomy   CESAREAN SECTION N/A 06/26/2016   Procedure: CESAREAN SECTION;  Surgeon: Aletha Halim, MD;  Location: [facility];  Service: Obstetrics;  Laterality: N/A;   CYSTOTOMY     with repair at 2014 repeat c-section    Family History  Problem Relation Age of Onset   Diabetes Mother    Diabetes Sister    Diabetes Brother    Hypertension Brother    Diabetes Sister    Diabetes Sister    Autism Son    Healthy Son    Healthy Daughter    Other Neg Hx     Social History   Socioeconomic History   Marital status: Married    Spouse name: Not on file   Number of children: Not on file   Years of education: Not on file   Highest education level: Not on file  Occupational History   Not on file  Tobacco Use   Smoking status: Never   Smokeless tobacco: Never  Vaping Use   Vaping Use: Never used  Substance and Sexual Activity   Alcohol use: No   Drug use: No   Sexual activity: Yes    Birth control/protection: None  Other Topics Concern   Not on file  Social History Narrative   Not on file   Social Determinants of Health  Financial Resource Strain: Not on file  Food Insecurity: Not on file  Transportation Needs: Not on file  Physical Activity: Not on file  Stress: Not on file  Social Connections: Not on file     Observations/Objective: Awake, alert and oriented x 3   Review of Systems  Constitutional:  Negative for fever, malaise/fatigue and weight loss.  HENT: Negative.  Negative for nosebleeds.   Eyes: Negative.  Negative for blurred vision, double vision and photophobia.  Respiratory:  Positive for cough. Negative for shortness of breath.   Cardiovascular: Negative.  Negative for chest pain, palpitations and leg swelling.  Gastrointestinal: Negative.  Negative for heartburn, nausea and vomiting.   Musculoskeletal: Negative.  Negative for myalgias.  Neurological: Negative.  Negative for dizziness, focal weakness, seizures and headaches.  Psychiatric/Behavioral: Negative.  Negative for suicidal ideas.    Assessment and Plan: Diagnoses and all orders for this visit:  Chronic cough -     pantoprazole (PROTONIX) 40 MG tablet; Take 1 tablet (40 mg total) by mouth daily. -     mometasone-formoterol (DULERA) 100-5 MCG/ACT AERO; Inhale 2 puffs into the lungs 2 (two) times daily. -     loratadine (CLARITIN) 10 MG tablet; Take 1 tablet (10 mg total) by mouth daily. cough     Follow Up Instructions Return if symptoms worsen or fail to improve.     I discussed the assessment and treatment plan with the patient. The patient was provided an opportunity to ask questions and all were answered. The patient agreed with the plan and demonstrated an understanding of the instructions.   The patient was advised to call back or seek an in-person evaluation if the symptoms worsen or if the condition fails to improve as anticipated.  I provided 11 minutes of non-face-to-face time during this encounter including median intraservice time, reviewing previous notes, labs, imaging, medications and explaining diagnosis and management.  Gildardo Pounds, FNP-BC

## 2021-12-06 NOTE — Progress Notes (Deleted)
? ?Office Visit Note ? ?[name]             ?Date of Birth: [birth year]           ?MRN: [id number]             ?PCP: Gildardo Pounds, NP ?Referring: Gildardo Pounds, NP ?Visit Date: 12/20/2021 ?Occupation: '@GUAROCC'$ @ ? ?Subjective:  ?No chief complaint on file. ? ? ?History of Present Illness: Michaela Moran is a 41 y.o. female ***  ? ?Activities of Daily Living:  ?Patient reports morning stiffness for *** {minute/hour:19697}.   ?Patient {ACTIONS;DENIES/REPORTS:21021675::"Denies"} nocturnal pain.  ?Difficulty dressing/grooming: {ACTIONS;DENIES/REPORTS:21021675::"Denies"} ?Difficulty climbing stairs: {ACTIONS;DENIES/REPORTS:21021675::"Denies"} ?Difficulty getting out of chair: {ACTIONS;DENIES/REPORTS:21021675::"Denies"} ?Difficulty using hands for taps, buttons, cutlery, and/or writing: {ACTIONS;DENIES/REPORTS:21021675::"Denies"} ? ?No Rheumatology ROS completed.  ? ?PMFS History:  ?Patient Active Problem List  ? Diagnosis Date Noted  ? Dysuria 09/20/2021  ? Intramural and submucous leiomyoma of uterus 06/06/2021  ? Chronic pain of right knee 06/26/2018  ? 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 Language barrier, cultural differences 04/28/2016  ?  ?Past Medical History:  ?Diagnosis Date  ? Acid reflux   ? Female circumcision   ? Hemorrhoid   ? History of positive PPD   ? neg CXR  ? Hypercholesteremia   ? Previous cesarean delivery, antepartum condition or complication 4/31/5400  ? History of C/S x 2    ? S/P repeat low transverse C-section 06/27/2016  ?  ?Family History  ?Problem Relation Age of Onset  ? Diabetes Mother   ? Diabetes Sister   ? Diabetes Brother   ? Hypertension Brother   ? Diabetes Sister   ? Diabetes Sister   ? Autism Son   ? Healthy Son   ? Healthy Daughter   ? Other Neg Hx   ? ?Past Surgical History:  ?Procedure Laterality Date  ? ARTERY BIOPSY Right 02/17/2021  ? Procedure: RIGHT TEMPORAL ARTERY BIOPSY;  Surgeon: Angelia Mould,  MD;  Location: [facility];  Service: Vascular;  Laterality: Right;  ? CESAREAN SECTION    ? CESAREAN SECTION N/A 01/02/2013  ? Procedure: CESAREAN SECTION;  Surgeon: Donnamae Jude, MD;  Location: [facility];  Service: Obstetrics;  Laterality: N/A;  Incidental Cyystotomy  ? CESAREAN SECTION N/A 06/26/2016  ? Procedure: CESAREAN SECTION;  Surgeon: Aletha Halim, MD;  Location: [facility];  Service: Obstetrics;  Laterality: N/A;  ? CYSTOTOMY    ? with repair at 2014 repeat c-section  ? ?Social History  ? ?Social History Narrative  ? Not on file  ? ?Immunization History  ?Administered Date(s) Administered  ? Influenza,inj,Quad PF,6+ Mos 06/28/2016, 08/30/2020, 09/20/2021  ? PFIZER(Purple Top)SARS-COV-2 Vaccination 01/26/2020, 02/16/2020  ?  ? ?Objective: ?Vital Signs: There were no vitals taken for this visit.  ? ?Physical Exam  ? ?Musculoskeletal Exam: *** ? ?CDAI Exam: ?CDAI Score: -- ?Patient Global: --; Provider Global: -- ?Swollen: --; Tender: -- ?Joint Exam 12/20/2021  ? ?No joint exam has been documented for this visit  ? ?There is currently no information documented on the homunculus. Go to the Rheumatology activity and complete the homunculus joint exam. ? ?Investigation: ?No additional findings. ? ?Imaging: ?No results found. ? ?Recent Labs: ?Lab Results  ?Component Value Date  ? WBC 4.9 06/03/2021  ? HGB 13.7 06/03/2021  ? PLT 316 06/03/2021  ? NA 137 09/20/2021  ? K 3.6 09/20/2021  ? CL 101 09/20/2021  ? CO2 22  09/20/2021  ? GLUCOSE 111 (H) 09/20/2021  ? BUN 9 09/20/2021  ? CREATININE 0.82 09/20/2021  ? BILITOT 0.3 09/20/2021  ? ALKPHOS 85 09/20/2021  ? AST 16 09/20/2021  ? ALT 19 09/20/2021  ? PROT 6.9 09/20/2021  ? ALBUMIN 4.2 09/20/2021  ? CALCIUM 9.3 09/20/2021  ? GFRAA 127 05/03/2020  ? ? ?Speciality Comments: No specialty comments available. ? ?Procedures:  ?No procedures performed ?Allergies: Other  ? ?Assessment / Plan:     ?Visit Diagnoses: No diagnosis found. ? ?Orders: ?No orders of the defined  types were placed in this encounter. ? ?No orders of the defined types were placed in this encounter. ? ? ?Face-to-face time spent with patient was *** minutes. Greater than 50% of time was spent in counseling and coordination of care. ? ?Follow-Up Instructions: No follow-ups on file. ? ? ?Earnestine Mealing, CMA ? ?Note - This record has been created using [employer].  ?Chart creation errors have been sought, but may not always  ?have been located. Such creation errors do not reflect on  ?the standard of medical care.  ?

## 2021-12-08 ENCOUNTER — Ambulatory Visit: Payer: Medicaid Other | Admitting: Rheumatology

## 2021-12-14 ENCOUNTER — Other Ambulatory Visit (HOSPITAL_COMMUNITY)
Admission: RE | Admit: 2021-12-14 | Discharge: 2021-12-14 | Disposition: A | Discharge status: Home / Self Care | Payer: Medicaid Other | Source: Ambulatory Visit | Attending: Physician Assistant | Admitting: Physician Assistant

## 2021-12-14 ENCOUNTER — Encounter: Payer: Self-pay | Admitting: Physician Assistant

## 2021-12-14 ENCOUNTER — Ambulatory Visit (INDEPENDENT_AMBULATORY_CARE_PROVIDER_SITE_OTHER): Payer: Medicaid Other | Admitting: Physician Assistant

## 2021-12-14 VITALS — BP 120/85 | HR 84 | Temp 98.3°F | Resp 18 | Ht 64.0 in | Wt 194.0 lb

## 2021-12-14 DIAGNOSIS — R300 Dysuria: Secondary | ICD-10-CM | POA: Insufficient documentation

## 2021-12-14 DIAGNOSIS — N3001 Acute cystitis with hematuria: Secondary | ICD-10-CM | POA: Diagnosis not present

## 2021-12-14 LAB — POCT URINALYSIS DIP (CLINITEK)
Bilirubin, UA: NEGATIVE
Glucose, UA: NEGATIVE mg/dL
Ketones, POC UA: NEGATIVE mg/dL
Leukocytes, UA: NEGATIVE
Nitrite, UA: NEGATIVE
POC PROTEIN,UA: 100 — AB
Spec Grav, UA: 1.02 (ref 1.010–1.025)
Urobilinogen, UA: 0.2 E.U./dL
pH, UA: 6.5 (ref 5.0–8.0)

## 2021-12-14 MED ORDER — NITROFURANTOIN MONOHYD MACRO 100 MG PO CAPS
100.0000 mg | ORAL_CAPSULE | Freq: Two times a day (BID) | ORAL | 0 refills | Status: AC
Start: 2021-12-14 — End: 2021-12-21

## 2021-12-14 NOTE — Progress Notes (Signed)
Patient has not eaten today. ?Patient has taken medication. ?Patient denies pain at this time. ?

## 2021-12-14 NOTE — Patient Instructions (Signed)
You are going to take Macrobid twice a day for the next 7 days.  I encourage you to get plenty of rest and drink lots of water. ? ?We will call you with today's lab results. ? ?I hope that you feel better soon. ? ?Kennieth Rad, PA-C ?Physician Assistant ?[facility] ?[email or web address] ? ? ?Urinary Tract Infection, Adult ?A urinary tract infection (UTI) is an infection of any part of the urinary tract. The urinary tract includes the kidneys, ureters, bladder, and urethra. These organs make, store, and get rid of urine in the body. ?An upper UTI affects the ureters and kidneys. A lower UTI affects the bladder and urethra. ?What are the causes? ?Most urinary tract infections are caused by bacteria in your genital area around your urethra, where urine leaves your body. These bacteria grow and cause inflammation of your urinary tract. ?What increases the risk? ?You are more likely to develop this condition if: ?You have a urinary catheter that stays in place. ?You are not able to control when you urinate or have a bowel movement (incontinence). ?You are female and you: ?Use a spermicide or diaphragm for birth control. ?Have low estrogen levels. ?Are pregnant. ?You have certain genes that increase your risk. ?You are sexually active. ?You take antibiotic medicines. ?You have a condition that causes your flow of urine to slow down, such as: ?An enlarged prostate, if you are female. ?Blockage in your urethra. ?A kidney stone. ?A nerve condition that affects your bladder control (neurogenic bladder). ?Not getting enough to drink, or not urinating often. ?You have certain medical conditions, such as: ?Diabetes. ?A weak disease-fighting system (immunesystem). ?Sickle cell disease. ?Gout. ?Spinal cord injury. ?What are the signs or symptoms? ?Symptoms of this condition include: ?Needing to urinate right away (urgency). ?Frequent urination. This may include small amounts  of urine each time you urinate. ?Pain or burning with urination. ?Blood in the urine. ?Urine that smells bad or unusual. ?Trouble urinating. ?Cloudy urine. ?Vaginal discharge, if you are female. ?Pain in the abdomen or the lower back. ?You may also have: ?Vomiting or a decreased appetite. ?Confusion. ?Irritability or tiredness. ?A fever or chills. ?Diarrhea. ?The first symptom in older adults may be confusion. In some cases, they may not have any symptoms until the infection has worsened. ?How is this diagnosed? ?This condition is diagnosed based on your medical history and a physical exam. You may also have other tests, including: ?Urine tests. ?Blood tests. ?Tests for STIs (sexually transmitted infections). ?If you have had more than one UTI, a cystoscopy or imaging studies may be done to determine the cause of the infections. ?How is this treated? ?Treatment for this condition includes: ?Antibiotic medicine. ?Over-the-counter medicines to treat discomfort. ?Drinking enough water to stay hydrated. ?If you have frequent infections or have other conditions such as a kidney stone, you may need to see a health care provider who specializes in the urinary tract (urologist). ?In rare cases, urinary tract infections can cause sepsis. Sepsis is a life-threatening condition that occurs when the body responds to an infection. Sepsis is treated in the hospital with IV antibiotics, fluids, and other medicines. ?Follow these instructions at home: ?Medicines ?Take over-the-counter and prescription medicines only as told by your health care provider. ?If you were prescribed an antibiotic medicine, take it as told by your health care provider. Do not stop using the antibiotic even if you start to feel better. ?General instructions ?Make sure you: ?Empty your bladder often and completely. Do  not hold urine for long periods of time. ?Empty your bladder after sex. ?Wipe from front to back after urinating or having a bowel movement  if you are female. Use each tissue only one time when you wipe. ?Drink enough fluid to keep your urine pale yellow. ?Keep all follow-up visits. This is important. ?Contact a health care provider if: ?Your symptoms do not get better after 1-2 days. ?Your symptoms go away and then return. ?Get help right away if: ?You have severe pain in your back or your lower abdomen. ?You have a fever or chills. ?You have nausea or vomiting. ?Summary ?A urinary tract infection (UTI) is an infection of any part of the urinary tract, which includes the kidneys, ureters, bladder, and urethra. ?Most urinary tract infections are caused by bacteria in your genital area. ?Treatment for this condition often includes antibiotic medicines. ?If you were prescribed an antibiotic medicine, take it as told by your health care provider. Do not stop using the antibiotic even if you start to feel better. ?Keep all follow-up visits. This is important. ?This information is not intended to replace advice given to you by your health care provider. Make sure you discuss any questions you have with your health care provider. ?Document Revised: 04/15/2020 Document Reviewed: 04/15/2020 ?Elsevier Patient Education ? [place]. ? ?

## 2021-12-14 NOTE — Progress Notes (Signed)
? ?Established Patient Office Visit ? ?Subjective:  ?Patient ID: Michaela Moran, female    DOB: Apr 03, 1981  Age: 41 y.o. MRN: [id number] ? ?CC:  ?Chief Complaint  ?Patient presents with  ? Urinary Tract Infection  ? ? ?HPI ?Michaela Moran states that she has been having bilateral lower back pain for the last 3 days, states then last night she started having increased urgency, and dysuria.  States that she also has been experiencing suprapubic pressure over the last couple of days as well as a white discharge.  Denies change in odor.  States that she has been drinking lots of water without much relief.   ? ?Past Medical History:  ?Diagnosis Date  ? Acid reflux   ? Female circumcision   ? Hemorrhoid   ? History of positive PPD   ? neg CXR  ? Hypercholesteremia   ? Previous cesarean delivery, antepartum condition or complication 1/44/8185  ? History of C/S x 2    ? S/P repeat low transverse C-section 06/27/2016  ? ? ?Past Surgical History:  ?Procedure Laterality Date  ? ARTERY BIOPSY Right 02/17/2021  ? Procedure: RIGHT TEMPORAL ARTERY BIOPSY;  Surgeon: Angelia Mould, MD;  Location: [facility];  Service: Vascular;  Laterality: Right;  ? CESAREAN SECTION    ? CESAREAN SECTION N/A 01/02/2013  ? Procedure: CESAREAN SECTION;  Surgeon: Donnamae Jude, MD;  Location: [facility];  Service: Obstetrics;  Laterality: N/A;  Incidental Cyystotomy  ? CESAREAN SECTION N/A 06/26/2016  ? Procedure: CESAREAN SECTION;  Surgeon: Aletha Halim, MD;  Location: [facility];  Service: Obstetrics;  Laterality: N/A;  ? CYSTOTOMY    ? with repair at 2014 repeat c-section  ? ? ?Family History  ?Problem Relation Age of Onset  ? Diabetes Mother   ? Diabetes Sister   ? Diabetes Brother   ? Hypertension Brother   ? Diabetes Sister   ? Diabetes Sister   ? Autism Son   ? Healthy Son   ? Healthy Daughter   ? Other Neg Hx   ? ? ?Social History  ? ?Socioeconomic History  ? Marital status: Married  ?  Spouse name: Not on file  ? Number of children: Not on  file  ? Years of education: Not on file  ? Highest education level: Not on file  ?Occupational History  ? Not on file  ?Tobacco Use  ? Smoking status: Never  ? Smokeless tobacco: Never  ?Vaping Use  ? Vaping Use: Never used  ?Substance and Sexual Activity  ? Alcohol use: No  ? Drug use: No  ? Sexual activity: Yes  ?  Birth control/protection: None  ?Other Topics Concern  ? Not on file  ?Social History Narrative  ? Not on file  ? ?Social Determinants of Health  ? ?Financial Resource Strain: Not on file  ?Food Insecurity: Not on file  ?Transportation Needs: Not on file  ?Physical Activity: Not on file  ?Stress: Not on file  ?Social Connections: Not on file  ?Intimate Partner Violence: Not on file  ? ? ?Outpatient Medications Prior to Visit  ?Medication Sig Dispense Refill  ? Dapsone (ACZONE) 5 % topical gel Apply topically 2 times per day 60 g 1  ? gabapentin (NEURONTIN) 300 MG capsule Take 1 capsule (300 mg total) by mouth 3 (three) times daily. 90 capsule 3  ? loratadine (CLARITIN) 10 MG tablet Take 1 tablet (10 mg total) by mouth daily. cough 30 tablet 11  ? mometasone-formoterol (  DULERA) 100-5 MCG/ACT AERO Inhale 2 puffs into the lungs 2 (two) times daily. 8.8 g 3  ? pantoprazole (PROTONIX) 40 MG tablet Take 1 tablet (40 mg total) by mouth daily. 30 tablet 3  ? Prenatal Vit-Fe Fumarate-FA (PRENATAL VITAMINS) 28-0.8 MG TABS Take 1 tablet by mouth daily. 30 tablet 5  ? tretinoin (RETIN-A) 0.025 % cream Apply topically at bedtime. 45 g 1  ? ?No facility-administered medications prior to visit.  ? ? ?Allergies  ?Allergen Reactions  ? Other Swelling  ?  Allergic to peas.  ? ? ?ROS ?Review of Systems  ?Constitutional:  Negative for chills and fever.  ?HENT: Negative.    ?Eyes: Negative.   ?Respiratory:  Negative for shortness of breath.   ?Cardiovascular:  Negative for chest pain.  ?Gastrointestinal:  Positive for abdominal pain. Negative for diarrhea and nausea.  ?Endocrine: Negative.   ?Genitourinary:  Positive for  dysuria, frequency, urgency and vaginal discharge. Negative for genital sores and vaginal bleeding.  ?Musculoskeletal:  Positive for back pain.  ?Skin: Negative.   ?Allergic/Immunologic: Negative.   ?Neurological:  Negative for headaches.  ?Hematological: Negative.   ?Psychiatric/Behavioral: Negative.    ? ?  ?Objective:  ?  ?Physical Exam ?Vitals and nursing note reviewed.  ?Constitutional:   ?   Appearance: Normal appearance.  ?HENT:  ?   Head: Normocephalic and atraumatic.  ?   Right Ear: External ear normal.  ?   Left Ear: External ear normal.  ?   Nose: Nose normal.  ?   Mouth/Throat:  ?   Mouth: Mucous membranes are moist.  ?   Pharynx: Oropharynx is clear.  ?Eyes:  ?   Extraocular Movements: Extraocular movements intact.  ?   Conjunctiva/sclera: Conjunctivae normal.  ?   Pupils: Pupils are equal, round, and reactive to light.  ?Cardiovascular:  ?   Rate and Rhythm: Normal rate and regular rhythm.  ?   Pulses: Normal pulses.  ?   Heart sounds: Normal heart sounds.  ?Pulmonary:  ?   Effort: Pulmonary effort is normal.  ?   Breath sounds: Normal breath sounds.  ?Abdominal:  ?   General: Abdomen is flat.  ?   Tenderness: There is abdominal tenderness in the suprapubic area. There is right CVA tenderness and left CVA tenderness.  ?Musculoskeletal:     ?   General: Normal range of motion.  ?   Cervical back: Normal range of motion and neck supple.  ?Skin: ?   General: Skin is warm and dry.  ?Neurological:  ?   General: No focal deficit present.  ?   Mental Status: She is alert and oriented to person, place, and time.  ?Psychiatric:     ?   Mood and Affect: Mood normal.     ?   Behavior: Behavior normal.     ?   Thought Content: Thought content normal.     ?   Judgment: Judgment normal.  ? ? ?BP 120/85 (BP Location: Left Arm, Patient Position: Sitting, Cuff Size: Large)   Pulse 84   Temp 98.3 ?F (36.8 ?C) (Oral)   Resp 18   Ht $R'5\' 4"'cl$  (1.626 m)   Wt 194 lb (88 kg)   LMP 11/29/2021   SpO2 98%   BMI 33.30 kg/m?   ?Wt Readings from Last 3 Encounters:  ?12/14/21 194 lb (88 kg)  ?09/20/21 204 lb 6 oz (92.7 kg)  ?09/20/21 200 lb (90.7 kg)  ? ? ? ?Health Maintenance Due  ?Topic  Date Due  ? FOOT EXAM  Never done  ? OPHTHALMOLOGY EXAM  Never done  ? URINE MICROALBUMIN  Never done  ? TETANUS/TDAP  Never done  ? COVID-19 Vaccine (3 - Booster for Pfizer series) 04/12/2020  ? ? ?There are no preventive care reminders to display for this patient. ? ?Lab Results  ?Component Value Date  ? TSH 2.890 05/11/2019  ? ?Lab Results  ?Component Value Date  ? WBC 4.9 06/03/2021  ? HGB 13.7 06/03/2021  ? HCT 40.0 06/03/2021  ? MCV 92.2 06/03/2021  ? PLT 316 06/03/2021  ? ?Lab Results  ?Component Value Date  ? NA 137 09/20/2021  ? K 3.6 09/20/2021  ? CO2 22 09/20/2021  ? GLUCOSE 111 (H) 09/20/2021  ? BUN 9 09/20/2021  ? CREATININE 0.82 09/20/2021  ? BILITOT 0.3 09/20/2021  ? ALKPHOS 85 09/20/2021  ? AST 16 09/20/2021  ? ALT 19 09/20/2021  ? PROT 6.9 09/20/2021  ? ALBUMIN 4.2 09/20/2021  ? CALCIUM 9.3 09/20/2021  ? ANIONGAP 6 06/03/2021  ? EGFR 92 09/20/2021  ? ?Lab Results  ?Component Value Date  ? CHOL 216 (H) 05/03/2020  ? ?Lab Results  ?Component Value Date  ? HDL 51 05/03/2020  ? ?Lab Results  ?Component Value Date  ? LDLCALC 146 (H) 05/03/2020  ? ?Lab Results  ?Component Value Date  ? TRIG 104 05/03/2020  ? ?Lab Results  ?Component Value Date  ? CHOLHDL 4.2 05/03/2020  ? ?Lab Results  ?Component Value Date  ? HGBA1C 5.5 09/20/2021  ? ? ?  ?Assessment & Plan:  ? ?Problem List Items Addressed This Visit   ?None ?Visit Diagnoses   ? ? Acute cystitis with hematuria    -  Primary  ? Relevant Medications  ? nitrofurantoin, macrocrystal-monohydrate, (MACROBID) 100 MG capsule  ? Other Relevant Orders  ? POCT URINALYSIS DIP (CLINITEK) (Completed)  ? Cervicovaginal ancillary only  ? Urine Culture  ? ?  ? ? ?Meds ordered this encounter  ?Medications  ? nitrofurantoin, macrocrystal-monohydrate, (MACROBID) 100 MG capsule  ?  Sig: Take 1 capsule (100 mg  total) by mouth 2 (two) times daily for 7 days.  ?  Dispense:  14 capsule  ?  Refill:  0  ?  Order Specific Question:   Supervising Provider  ?  Answer:   Elsie Stain [[id number]]  ?1. Acute cystitis with hematuria ?

## 2021-12-15 LAB — CERVICOVAGINAL ANCILLARY ONLY
Bacterial Vaginitis (gardnerella): NEGATIVE
Candida Glabrata: NEGATIVE
Candida Vaginitis: NEGATIVE
Chlamydia: NEGATIVE
Comment: NEGATIVE
Comment: NEGATIVE
Comment: NEGATIVE
Comment: NEGATIVE
Comment: NEGATIVE
Comment: NORMAL
Neisseria Gonorrhea: NEGATIVE
Trichomonas: NEGATIVE

## 2021-12-18 LAB — URINE CULTURE

## 2021-12-20 ENCOUNTER — Ambulatory Visit: Payer: Self-pay | Admitting: *Deleted

## 2021-12-20 ENCOUNTER — Ambulatory Visit: Payer: Medicaid Other | Admitting: Rheumatology

## 2021-12-20 DIAGNOSIS — G8929 Other chronic pain: Secondary | ICD-10-CM

## 2021-12-20 DIAGNOSIS — N736 Female pelvic peritoneal adhesions (postinfective): Secondary | ICD-10-CM

## 2021-12-20 DIAGNOSIS — M25461 Effusion, right knee: Secondary | ICD-10-CM

## 2021-12-20 DIAGNOSIS — E559 Vitamin D deficiency, unspecified: Secondary | ICD-10-CM

## 2021-12-20 DIAGNOSIS — Z603 Acculturation difficulty: Secondary | ICD-10-CM

## 2021-12-20 DIAGNOSIS — M5136 Other intervertebral disc degeneration, lumbar region: Secondary | ICD-10-CM

## 2021-12-20 NOTE — Telephone Encounter (Signed)
Summary: cough and congstion / rx req  ? The patient shares that they're continuing to experience discomfort for their cough  ? ?The patient shares that they're experiencing congestion as well  ? ?The patient would like to be prescribed something for their discomfort  ? ?The patient would like to speak with a member of staff when possible   ?  ? ? ?Via interpreter [phone], left message and call back number. ?

## 2021-12-20 NOTE — Telephone Encounter (Signed)
Reason for Disposition ? [1] Continuous (nonstop) coughing interferes with work or school AND [2] no improvement using cough treatment per Care Advice ? ?Answer Assessment - Initial Assessment Questions ?1. ONSET: "When did the cough begin?"  ?    Day before yesterday. Coughing a lot of mucus.   Nose is congested.   ?2. SEVERITY: "How bad is the cough today?"  ?    Cough ?3. SPUTUM: "Describe the color of your sputum" (none, dry cough; clear, white, yellow, green) ?    Yellow and brown    No fever  but have a headache ?My neck hurts when I cough.    ?4. HEMOPTYSIS: "Are you coughing up any blood?" If so ask: "How much?" (flecks, streaks, tablespoons, etc.) ?    *No Answer* ?5. DIFFICULTY BREATHING: "Are you having difficulty breathing?" If Yes, ask: "How bad is it?" (e.g., mild, moderate, severe)  ?  - MILD: No SOB at rest, mild SOB with walking, speaks normally in sentences, can lie down, no retractions, pulse < 100.  ?  - MODERATE: SOB at rest, SOB with minimal exertion and prefers to sit, cannot lie down flat, speaks in phrases, mild retractions, audible wheezing, pulse 100-120.  ?  - SEVERE: Very SOB at rest, speaks in single words, struggling to breathe, sitting hunched forward, retractions, pulse > 120  ?    *No Answer* ?6. FEVER: "Do you have a fever?" If Yes, ask: "What is your temperature, how was it measured, and when did it start?" ?    *No Answer* ?7. CARDIAC HISTORY: "Do you have any history of heart disease?" (e.g., heart attack, congestive heart failure)  ?    *No Answer* ?8. LUNG HISTORY: "Do you have any history of lung disease?"  (e.g., pulmonary embolus, asthma, emphysema) ?    *No Answer* ?9. PE RISK FACTORS: "Do you have a history of blood clots?" (or: recent major surgery, recent prolonged travel, bedridden) ?    *No Answer* ?10. OTHER SYMPTOMS: "Do you have any other symptoms?" (e.g., runny nose, wheezing, chest pain) ?      *No Answer* ?11. PREGNANCY: "Is there any chance you are pregnant?"  "When was your last menstrual period?" ?      *No Answer* ?12. TRAVEL: "Have you traveled out of the country in the last month?" (e.g., travel history, exposures) ?      *No Answer* ? ?Protocols used: Cough - Acute Productive-A-AH ? ?Chief Complaint: coughing a lot ?Symptoms: coughing up yellow to brown mucus ?Frequency: Has a headache too ?Pertinent Negatives: Patient denies N/A ?Disposition: '[]'$ ED /'[]'$ Urgent Care (no appt availability in office) / '[x]'$ Appointment(In office/virtual)/ '[]'$  [facility]/ '[]'$ Home Care/ '[]'$ Refused Recommended Disposition /'[]'$ [facility]/ '[]'$  Follow-up with PCP ?Additional Notes:   ?

## 2021-12-21 ENCOUNTER — Ambulatory Visit (INDEPENDENT_AMBULATORY_CARE_PROVIDER_SITE_OTHER): Payer: Medicaid Other | Admitting: Physician Assistant

## 2021-12-21 ENCOUNTER — Encounter: Payer: Self-pay | Admitting: Physician Assistant

## 2021-12-21 VITALS — BP 119/83 | HR 78 | Temp 98.3°F | Resp 18 | Wt 194.0 lb

## 2021-12-21 DIAGNOSIS — J302 Other seasonal allergic rhinitis: Secondary | ICD-10-CM | POA: Diagnosis not present

## 2021-12-21 DIAGNOSIS — R051 Acute cough: Secondary | ICD-10-CM

## 2021-12-21 MED ORDER — BENZONATATE 200 MG PO CAPS: 200.0000 mg | ORAL_CAPSULE | Freq: Two times a day (BID) | ORAL | 0 refills | Status: DC | PRN

## 2021-12-21 NOTE — Progress Notes (Signed)
? ?Established Patient Office Visit ? ?Subjective:  ?Patient ID: Michaela Moran, female    DOB: 21-Nov-1980  Age: 41 y.o. MRN: [id number] ? ?CC:  ?Chief Complaint  ?Patient presents with  ? Cough  ? Headache  ? ? ?HPI ?Michaela Moran states that she has been having a productive cough with yellow and brown sputum, states that this is causing her to have a sore throat and headache.  States that the cough did come first, states that this has been going on for the past 3 days.  States that the cough is keeping her awake at night.  States that she is using Claritin, and her inhaler without relief.  Denies sick contacts ? ?Denies any other URI symptoms.  States that she is very concerned that she continues to have recurrent coughs and would like to have allergy testing completed  ? ?Due to language barrier, an interpreter was present during the history-taking and subsequent discussion (and for part of the physical exam) with this patient. ? ? ? ? ? ?Past Medical History:  ?Diagnosis Date  ? Acid reflux   ? Female circumcision   ? Hemorrhoid   ? History of positive PPD   ? neg CXR  ? Hypercholesteremia   ? Previous cesarean delivery, antepartum condition or complication 12/24/8117  ? History of C/S x 2    ? S/P repeat low transverse C-section 06/27/2016  ? ? ?Past Surgical History:  ?Procedure Laterality Date  ? ARTERY BIOPSY Right 02/17/2021  ? Procedure: RIGHT TEMPORAL ARTERY BIOPSY;  Surgeon: Angelia Mould, MD;  Location: [facility];  Service: Vascular;  Laterality: Right;  ? CESAREAN SECTION    ? CESAREAN SECTION N/A 01/02/2013  ? Procedure: CESAREAN SECTION;  Surgeon: Donnamae Jude, MD;  Location: [facility];  Service: Obstetrics;  Laterality: N/A;  Incidental Cyystotomy  ? CESAREAN SECTION N/A 06/26/2016  ? Procedure: CESAREAN SECTION;  Surgeon: Aletha Halim, MD;  Location: [facility];  Service: Obstetrics;  Laterality: N/A;  ? CYSTOTOMY    ? with repair at 2014 repeat c-section  ? ? ?Family History  ?Problem Relation  Age of Onset  ? Diabetes Mother   ? Diabetes Sister   ? Diabetes Brother   ? Hypertension Brother   ? Diabetes Sister   ? Diabetes Sister   ? Autism Son   ? Healthy Son   ? Healthy Daughter   ? Other Neg Hx   ? ? ?Social History  ? ?Socioeconomic History  ? Marital status: Married  ?  Spouse name: Not on file  ? Number of children: Not on file  ? Years of education: Not on file  ? Highest education level: Not on file  ?Occupational History  ? Not on file  ?Tobacco Use  ? Smoking status: Never  ? Smokeless tobacco: Never  ?Vaping Use  ? Vaping Use: Never used  ?Substance and Sexual Activity  ? Alcohol use: No  ? Drug use: No  ? Sexual activity: Yes  ?  Birth control/protection: None  ?Other Topics Concern  ? Not on file  ?Social History Narrative  ? Not on file  ? ?Social Determinants of Health  ? ?Financial Resource Strain: Not on file  ?Food Insecurity: Not on file  ?Transportation Needs: Not on file  ?Physical Activity: Not on file  ?Stress: Not on file  ?Social Connections: Not on file  ?Intimate Partner Violence: Not on file  ? ? ?Outpatient Medications Prior to Visit  ?Medication  Sig Dispense Refill  ? Dapsone (ACZONE) 5 % topical gel Apply topically 2 times per day 60 g 1  ? gabapentin (NEURONTIN) 300 MG capsule Take 1 capsule (300 mg total) by mouth 3 (three) times daily. 90 capsule 3  ? loratadine (CLARITIN) 10 MG tablet Take 1 tablet (10 mg total) by mouth daily. cough 30 tablet 11  ? mometasone-formoterol (DULERA) 100-5 MCG/ACT AERO Inhale 2 puffs into the lungs 2 (two) times daily. 8.8 g 3  ? nitrofurantoin, macrocrystal-monohydrate, (MACROBID) 100 MG capsule Take 1 capsule (100 mg total) by mouth 2 (two) times daily for 7 days. 14 capsule 0  ? pantoprazole (PROTONIX) 40 MG tablet Take 1 tablet (40 mg total) by mouth daily. 30 tablet 3  ? Prenatal Vit-Fe Fumarate-FA (PRENATAL VITAMINS) 28-0.8 MG TABS Take 1 tablet by mouth daily. 30 tablet 5  ? tretinoin (RETIN-A) 0.025 % cream Apply topically at bedtime.  45 g 1  ? ?No facility-administered medications prior to visit.  ? ? ?Allergies  ?Allergen Reactions  ? Other Swelling  ?  Allergic to peas.  ? ? ?ROS ?Review of Systems  ?Constitutional:  Negative for chills and fever.  ?HENT:  Positive for congestion, sneezing and sore throat. Negative for ear pain, sinus pressure, sinus pain and trouble swallowing.   ?Eyes: Negative.   ?Respiratory:  Positive for cough. Negative for shortness of breath and wheezing.   ?Cardiovascular:  Negative for chest pain.  ?Gastrointestinal:  Negative for nausea and vomiting.  ?Endocrine: Negative.   ?Genitourinary: Negative.   ?Musculoskeletal: Negative.   ?Skin: Negative.   ?Allergic/Immunologic: Negative.   ?Neurological:  Positive for headaches. Negative for dizziness, speech difficulty and weakness.  ?Hematological: Negative.   ?Psychiatric/Behavioral: Negative.    ? ?  ?Objective:  ?  ?Physical Exam ?Vitals and nursing note reviewed.  ?Constitutional:   ?   Appearance: Normal appearance.  ?HENT:  ?   Head: Normocephalic and atraumatic.  ?   Right Ear: Tympanic membrane, ear canal and external ear normal.  ?   Left Ear: Tympanic membrane, ear canal and external ear normal.  ?   Nose: Congestion present.  ?   Right Turbinates: Swollen.  ?   Left Turbinates: Swollen.  ?   Right Sinus: No maxillary sinus tenderness or frontal sinus tenderness.  ?   Left Sinus: No frontal sinus tenderness.  ?   Mouth/Throat:  ?   Mouth: Mucous membranes are moist.  ?   Pharynx: Oropharynx is clear.  ?Eyes:  ?   Extraocular Movements: Extraocular movements intact.  ?   Conjunctiva/sclera: Conjunctivae normal.  ?   Pupils: Pupils are equal, round, and reactive to light.  ?Cardiovascular:  ?   Rate and Rhythm: Normal rate and regular rhythm.  ?   Pulses: Normal pulses.  ?   Heart sounds: Normal heart sounds.  ?Pulmonary:  ?   Effort: Pulmonary effort is normal.  ?   Breath sounds: Normal breath sounds. No wheezing.  ?Musculoskeletal:     ?   General: Normal  range of motion.  ?   Cervical back: Normal range of motion and neck supple.  ?Lymphadenopathy:  ?   Cervical: No cervical adenopathy.  ?Skin: ?   General: Skin is warm and dry.  ?Neurological:  ?   General: No focal deficit present.  ?   Mental Status: She is alert and oriented to person, place, and time.  ?Psychiatric:     ?   Mood and Affect: Mood  normal.     ?   Behavior: Behavior normal.     ?   Thought Content: Thought content normal.     ?   Judgment: Judgment normal.  ? ? ?BP 119/83 (BP Location: Left Arm, Patient Position: Sitting, Cuff Size: Large)   Pulse 78   Temp 98.3 ?F (36.8 ?C)   Resp 18   Wt 194 lb (88 kg)   LMP 11/29/2021   SpO2 97%   BMI 33.30 kg/m?  ?Wt Readings from Last 3 Encounters:  ?12/21/21 194 lb (88 kg)  ?12/14/21 194 lb (88 kg)  ?09/20/21 204 lb 6 oz (92.7 kg)  ? ? ? ?Health Maintenance Due  ?Topic Date Due  ? FOOT EXAM  Never done  ? OPHTHALMOLOGY EXAM  Never done  ? URINE MICROALBUMIN  Never done  ? TETANUS/TDAP  Never done  ? COVID-19 Vaccine (3 - Booster for Pfizer series) 04/12/2020  ? ? ?There are no preventive care reminders to display for this patient. ? ?Lab Results  ?Component Value Date  ? TSH 2.890 05/11/2019  ? ?Lab Results  ?Component Value Date  ? WBC 4.9 06/03/2021  ? HGB 13.7 06/03/2021  ? HCT 40.0 06/03/2021  ? MCV 92.2 06/03/2021  ? PLT 316 06/03/2021  ? ?Lab Results  ?Component Value Date  ? NA 137 09/20/2021  ? K 3.6 09/20/2021  ? CO2 22 09/20/2021  ? GLUCOSE 111 (H) 09/20/2021  ? BUN 9 09/20/2021  ? CREATININE 0.82 09/20/2021  ? BILITOT 0.3 09/20/2021  ? ALKPHOS 85 09/20/2021  ? AST 16 09/20/2021  ? ALT 19 09/20/2021  ? PROT 6.9 09/20/2021  ? ALBUMIN 4.2 09/20/2021  ? CALCIUM 9.3 09/20/2021  ? ANIONGAP 6 06/03/2021  ? EGFR 92 09/20/2021  ? ?Lab Results  ?Component Value Date  ? CHOL 216 (H) 05/03/2020  ? ?Lab Results  ?Component Value Date  ? HDL 51 05/03/2020  ? ?Lab Results  ?Component Value Date  ? LDLCALC 146 (H) 05/03/2020  ? ?Lab Results  ?Component  Value Date  ? TRIG 104 05/03/2020  ? ?Lab Results  ?Component Value Date  ? CHOLHDL 4.2 05/03/2020  ? ?Lab Results  ?Component Value Date  ? HGBA1C 5.5 09/20/2021  ? ? ?  ?Assessment & Plan:  ? ?Problem List Item

## 2021-12-21 NOTE — Patient Instructions (Signed)
You are going to use Tessalon Perles twice a day as needed to help you with your cough.  Controlling the cough should help improve your sore throat ? ?Please let us know if there is anything else we can do for you. ? ?Kennieth Rad, PA-C ?Physician Assistant ?[place] ?[email or web address] ? ? ? ?

## 2021-12-21 NOTE — Progress Notes (Signed)
Pt presents for headache and cough that started about 3 days ago, pt states feel like something is always stuck in throat  ?

## 2021-12-26 ENCOUNTER — Telehealth: Payer: Self-pay | Admitting: *Deleted

## 2021-12-26 NOTE — Telephone Encounter (Signed)
Patient verified DOB ?Patient is aware of labs being normal. ? ?

## 2021-12-26 NOTE — Telephone Encounter (Signed)
-----   Message from Kennieth Rad, [place] sent at 12/18/2021  8:02 AM EDT ----- ?Please call patient and let her know that her urine culture did not show any bacteria, her vaginal swab was negative for gonorrhea, chlamydia, trichomonas and bacterial vaginitis. ?

## 2021-12-26 NOTE — Telephone Encounter (Signed)
Medical Assistant used [employer] to contact patient.  ?Interpreter Name: Interpreter #:  ?Patient was not available x2, [employer] left patient a voicemail. ?Voicemail states to give a call back to [place] with [facility] at [phone]. ?Patient may view results via mychart and contact the office for any questions at [phone]. ? ? ?

## 2022-01-15 ENCOUNTER — Ambulatory Visit: Payer: Medicaid Other | Attending: Nurse Practitioner | Admitting: Nurse Practitioner

## 2022-01-15 ENCOUNTER — Encounter: Payer: Self-pay | Admitting: Nurse Practitioner

## 2022-01-15 VITALS — BP 106/72 | HR 87 | Temp 98.0°F | Resp 16 | Wt 198.0 lb

## 2022-01-15 DIAGNOSIS — R3121 Asymptomatic microscopic hematuria: Secondary | ICD-10-CM | POA: Diagnosis not present

## 2022-01-15 DIAGNOSIS — R7303 Prediabetes: Secondary | ICD-10-CM | POA: Diagnosis not present

## 2022-01-15 DIAGNOSIS — M545 Low back pain, unspecified: Secondary | ICD-10-CM

## 2022-01-15 DIAGNOSIS — E785 Hyperlipidemia, unspecified: Secondary | ICD-10-CM | POA: Diagnosis not present

## 2022-01-15 LAB — POCT URINALYSIS DIP (CLINITEK)
Bilirubin, UA: NEGATIVE
Glucose, UA: NEGATIVE mg/dL
Ketones, POC UA: NEGATIVE mg/dL
Leukocytes, UA: NEGATIVE
Nitrite, UA: NEGATIVE
POC PROTEIN,UA: NEGATIVE
Spec Grav, UA: 1.025 (ref 1.010–1.025)
Urobilinogen, UA: 0.2 E.U./dL
pH, UA: 6 (ref 5.0–8.0)

## 2022-01-15 MED ORDER — CYCLOBENZAPRINE HCL 5 MG PO TABS: 5.0000 mg | ORAL_TABLET | Freq: Three times a day (TID) | ORAL | 1 refills | Status: DC | PRN

## 2022-01-15 NOTE — Progress Notes (Signed)
Back pain and tightness x 2 days.  ?No relief with Tylenol.  ?

## 2022-01-15 NOTE — Progress Notes (Signed)
? ?Assessment & Plan:  ?[name] was seen today for back pain. ? ?Diagnoses and all orders for this visit: ? ?Acute low back pain, unspecified back pain laterality, unspecified whether sciatica present ?-     POCT URINALYSIS DIP (CLINITEK) ?-     cyclobenzaprine (FLEXERIL) 5 MG tablet; Take 1 tablet (5 mg total) by mouth 3 (three) times daily as needed for muscle spasms. ? ?Prediabetes ?-     Microalbumin / creatinine urine ratio ?-     CMP14+EGFR ?-     Hemoglobin A1c ? ?Dyslipidemia, goal LDL below 70 ?-     Lipid panel ? ?Asymptomatic microscopic hematuria ?-     Urinalysis, Complete ? ? ? ?Patient has been counseled on age-appropriate routine health concerns for screening and prevention. These are reviewed and up-to-date. Referrals have been placed accordingly. Immunizations are up-to-date or declined.    ?Subjective:  ? ?Chief Complaint  ?Patient presents with  ? Back Pain  ? ? ?Michaela Moran 41 y.o. female presents to office today with complaints of acute on chronic back pain ? ?States she went shopping over the weekend and now she feels like her back pain has worsened. Can not recall any specific injury. Aggravating factors: bending, twisting, sitting.  She has chronic low back pain and has an upcoming appt with ortho. States tylenol 500 mg bid has been effective. She also has gabapentin at home however she has not taken this for her back pain as she was unaware she could use it for other conditions aside from her previous leg pain. She believes it may be related to her kidneys.  ? ? ? ? ?Review of Systems  ?Constitutional:  Negative for fever, malaise/fatigue and weight loss.  ?HENT: Negative.  Negative for nosebleeds.   ?Eyes: Negative.  Negative for blurred vision, double vision and photophobia.  ?Respiratory: Negative.  Negative for cough and shortness of breath.   ?Cardiovascular: Negative.  Negative for chest pain, palpitations and leg swelling.  ?Gastrointestinal: Negative.  Negative for heartburn, nausea  and vomiting.  ?Genitourinary: Negative.  Negative for dysuria, flank pain, frequency, hematuria and urgency.  ?Musculoskeletal:  Positive for back pain and myalgias.  ?Neurological: Negative.  Negative for dizziness, focal weakness, seizures and headaches.  ?Psychiatric/Behavioral: Negative.  Negative for suicidal ideas.   ? ?Past Medical History:  ?Diagnosis Date  ? Acid reflux   ? Female circumcision   ? Hemorrhoid   ? History of positive PPD   ? neg CXR  ? Hypercholesteremia   ? Previous cesarean delivery, antepartum condition or complication 0/11/7046  ? History of C/S x 2    ? S/P repeat low transverse C-section 06/27/2016  ? ? ?Past Surgical History:  ?Procedure Laterality Date  ? ARTERY BIOPSY Right 02/17/2021  ? Procedure: RIGHT TEMPORAL ARTERY BIOPSY;  Surgeon: Angelia Mould, MD;  Location: [facility];  Service: Vascular;  Laterality: Right;  ? CESAREAN SECTION    ? CESAREAN SECTION N/A 01/02/2013  ? Procedure: CESAREAN SECTION;  Surgeon: Donnamae Jude, MD;  Location: [facility];  Service: Obstetrics;  Laterality: N/A;  Incidental Cyystotomy  ? CESAREAN SECTION N/A 06/26/2016  ? Procedure: CESAREAN SECTION;  Surgeon: Aletha Halim, MD;  Location: [facility];  Service: Obstetrics;  Laterality: N/A;  ? CYSTOTOMY    ? with repair at 2014 repeat c-section  ? ? ?Family History  ?Problem Relation Age of Onset  ? Diabetes Mother   ? Diabetes Sister   ? Diabetes Brother   ?  Hypertension Brother   ? Diabetes Sister   ? Diabetes Sister   ? Autism Son   ? Healthy Son   ? Healthy Daughter   ? Other Neg Hx   ? ? ?Social History Reviewed with no changes to be made today.  ? ?Outpatient Medications Prior to Visit  ?Medication Sig Dispense Refill  ? benzonatate (TESSALON) 200 MG capsule Take 1 capsule (200 mg total) by mouth 2 (two) times daily as needed for cough. 20 capsule 0  ? Dapsone (ACZONE) 5 % topical gel Apply topically 2 times per day 60 g 1  ? loratadine (CLARITIN) 10 MG tablet Take 1 tablet (10 mg  total) by mouth daily. cough 30 tablet 11  ? mometasone-formoterol (DULERA) 100-5 MCG/ACT AERO Inhale 2 puffs into the lungs 2 (two) times daily. 8.8 g 3  ? pantoprazole (PROTONIX) 40 MG tablet Take 1 tablet (40 mg total) by mouth daily. 30 tablet 3  ? Prenatal Vit-Fe Fumarate-FA (PRENATAL VITAMINS) 28-0.8 MG TABS Take 1 tablet by mouth daily. 30 tablet 5  ? tretinoin (RETIN-A) 0.025 % cream Apply topically at bedtime. 45 g 1  ? gabapentin (NEURONTIN) 300 MG capsule Take 1 capsule (300 mg total) by mouth 3 (three) times daily. 90 capsule 3  ? ?No facility-administered medications prior to visit.  ? ? ?Allergies  ?Allergen Reactions  ? Other Swelling  ?  Allergic to peas.  ? ? ?   ?Objective:  ?  ?BP 106/72 (BP Location: Left Arm, Patient Position: Sitting, Cuff Size: Large)   Pulse 87   Temp 98 ?F (36.7 ?C) (Oral)   Resp 16   Wt 198 lb (89.8 kg)   LMP 01/04/2022 (Approximate)   SpO2 100%   BMI 33.99 kg/m?  ?Wt Readings from Last 3 Encounters:  ?01/15/22 198 lb (89.8 kg)  ?12/21/21 194 lb (88 kg)  ?12/14/21 194 lb (88 kg)  ? ? ?Physical Exam ?Vitals and nursing note reviewed.  ?Constitutional:   ?   Appearance: She is well-developed.  ?HENT:  ?   Head: Normocephalic and atraumatic.  ?Cardiovascular:  ?   Rate and Rhythm: Normal rate and regular rhythm.  ?   Heart sounds: Normal heart sounds. No murmur heard. ?  No friction rub. No gallop.  ?Pulmonary:  ?   Effort: Pulmonary effort is normal. No tachypnea or respiratory distress.  ?   Breath sounds: Normal breath sounds. No decreased breath sounds, wheezing, rhonchi or rales.  ?Chest:  ?   Chest wall: No tenderness.  ?Abdominal:  ?   General: Bowel sounds are normal.  ?   Palpations: Abdomen is soft.  ?Musculoskeletal:     ?   General: Normal range of motion.  ?   Cervical back: Normal range of motion.  ?Skin: ?   General: Skin is warm and dry.  ?Neurological:  ?   Mental Status: She is alert and oriented to person, place, and time.  ?   Coordination:  Coordination normal.  ?Psychiatric:     ?   Behavior: Behavior normal. Behavior is cooperative.     ?   Thought Content: Thought content normal.     ?   Judgment: Judgment normal.  ? ? ? ? ?   ?Patient has been counseled extensively about nutrition and exercise as well as the importance of adherence with medications and regular follow-up. The patient was given clear instructions to go to ER or return to medical center if symptoms don't improve, worsen or  new problems develop. The patient verbalized understanding.  ? ?Follow-up: Return in about 6 months (around 07/18/2022), or if symptoms worsen or fail to improve.  ? ?Gildardo Pounds, FNP-BC ?[facility] ?[place], [place] ?[phone]   ?01/15/2022, 2:51 PM ?

## 2022-01-16 LAB — MICROSCOPIC EXAMINATION
Bacteria, UA: NONE SEEN
Casts: NONE SEEN /lpf
RBC, Urine: NONE SEEN /hpf (ref 0–2)
WBC, UA: NONE SEEN /hpf (ref 0–5)

## 2022-01-16 LAB — CMP14+EGFR
ALT: 13 IU/L (ref 0–32)
AST: 12 IU/L (ref 0–40)
Albumin/Globulin Ratio: 1.4 (ref 1.2–2.2)
Albumin: 4 g/dL (ref 3.8–4.8)
Alkaline Phosphatase: 86 IU/L (ref 44–121)
BUN/Creatinine Ratio: 13 (ref 9–23)
BUN: 11 mg/dL (ref 6–24)
Bilirubin Total: <0.2 mg/dL (ref 0.0–1.2)
CO2: 24 mmol/L (ref 20–29)
Calcium: 8.9 mg/dL (ref 8.7–10.2)
Chloride: 106 mmol/L (ref 96–106)
Creatinine, Ser: 0.82 mg/dL (ref 0.57–1.00)
Globulin, Total: 2.8 g/dL (ref 1.5–4.5)
Glucose: 102 mg/dL — ABNORMAL HIGH (ref 70–99)
Potassium: 4.1 mmol/L (ref 3.5–5.2)
Sodium: 141 mmol/L (ref 134–144)
Total Protein: 6.8 g/dL (ref 6.0–8.5)
eGFR: 92 mL/min/{1.73_m2} (ref >59)

## 2022-01-16 LAB — LIPID PANEL
Chol/HDL Ratio: 4.5 ratio — ABNORMAL HIGH (ref 0.0–4.4)
Cholesterol, Total: 234 mg/dL — ABNORMAL HIGH (ref 100–199)
HDL: 52 mg/dL (ref >39)
LDL Chol Calc (NIH): 165 mg/dL — ABNORMAL HIGH (ref 0–99)
Triglycerides: 95 mg/dL (ref 0–149)
VLDL Cholesterol Cal: 17 mg/dL (ref 5–40)

## 2022-01-16 LAB — URINALYSIS, COMPLETE
Bilirubin, UA: NEGATIVE
Glucose, UA: NEGATIVE
Ketones, UA: NEGATIVE
Leukocytes,UA: NEGATIVE
Nitrite, UA: NEGATIVE
Protein,UA: NEGATIVE
RBC, UA: NEGATIVE
Specific Gravity, UA: 1.018 (ref 1.005–1.030)
Urobilinogen, Ur: 0.2 mg/dL (ref 0.2–1.0)
pH, UA: 5.5 (ref 5.0–7.5)

## 2022-01-16 LAB — MICROALBUMIN / CREATININE URINE RATIO
Creatinine, Urine: 104.7 mg/dL
Microalb/Creat Ratio: 17 mg/g creat (ref 0–29)
Microalbumin, Urine: 17.3 ug/mL

## 2022-01-16 LAB — HEMOGLOBIN A1C
Est. average glucose Bld gHb Est-mCnc: 114 mg/dL
Hgb A1c MFr Bld: 5.6 % (ref 4.8–5.6)

## 2022-01-26 ENCOUNTER — Encounter (HOSPITAL_COMMUNITY): Payer: Self-pay | Admitting: Emergency Medicine

## 2022-01-26 ENCOUNTER — Ambulatory Visit (HOSPITAL_COMMUNITY)
Admission: EM | Admit: 2022-01-26 | Discharge: 2022-01-26 | Disposition: A | Discharge status: Home / Self Care | Payer: Medicaid Other | Attending: Internal Medicine | Admitting: Internal Medicine

## 2022-01-26 DIAGNOSIS — J0100 Acute maxillary sinusitis, unspecified: Secondary | ICD-10-CM

## 2022-01-26 MED ORDER — AMOXICILLIN 500 MG PO TABS: 500.0000 mg | ORAL_TABLET | Freq: Two times a day (BID) | ORAL | 0 refills | Status: AC

## 2022-01-26 MED ORDER — BENZONATATE 100 MG PO CAPS: 100.0000 mg | ORAL_CAPSULE | Freq: Three times a day (TID) | ORAL | 0 refills | Status: DC | PRN

## 2022-01-26 NOTE — ED Provider Notes (Signed)
?[facility] ? ? ? ?CSN: [id number] ?Arrival date & time: 01/26/22  1814 ? ? ?  ? ?History   ?Chief Complaint ?Chief Complaint  ?Patient presents with  ? Cough  ? Abdominal Pain  ? ? ?HPI ?Michaela Moran is a 41 y.o. female.  ?Cough since Monday making it difficult for her sleep ?Chest congestion and feels swollen in neck ?No fever or chills ?+HA, some sob that seems to occur with coughing fits ?Kids are sick with similar symptoms ?Denies any dizziness, ST, CP, wheezing or DOE ?Has not taken anything for symptoms. States she has been referred to allergist by PCP. Appt next month.  ? ?Past Medical History:  ?Diagnosis Date  ? Acid reflux   ? Female circumcision   ? Hemorrhoid   ? History of positive PPD   ? neg CXR  ? Hypercholesteremia   ? Previous cesarean delivery, antepartum condition or complication 4/43/1540  ? History of C/S x 2    ? S/P repeat low transverse C-section 06/27/2016  ? ? ?Patient Active Problem List  ? Diagnosis Date Noted  ? Dysuria 09/20/2021  ? Intramural and submucous leiomyoma of uterus 06/06/2021  ? Chronic pain of right knee 06/26/2018  ? 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 Language barrier, cultural differences 04/28/2016  ? ? ?Past Surgical History:  ?Procedure Laterality Date  ? ARTERY BIOPSY Right 02/17/2021  ? Procedure: RIGHT TEMPORAL ARTERY BIOPSY;  Surgeon: Angelia Mould, MD;  Location: [facility];  Service: Vascular;  Laterality: Right;  ? CESAREAN SECTION    ? CESAREAN SECTION N/A 01/02/2013  ? Procedure: CESAREAN SECTION;  Surgeon: Donnamae Jude, MD;  Location: [facility];  Service: Obstetrics;  Laterality: N/A;  Incidental Cyystotomy  ? CESAREAN SECTION N/A 06/26/2016  ? Procedure: CESAREAN SECTION;  Surgeon: Aletha Halim, MD;  Location: [facility];  Service: Obstetrics;  Laterality: N/A;  ? CYSTOTOMY    ? with repair at 2014 repeat c-section  ? ? ?OB History   ? ? Gravida  ?3  ? Para  ?3  ? Term  ?3  ?  Preterm  ?   ? AB  ?   ? Living  ?3  ?  ? ? SAB  ?   ? IAB  ?   ? Ectopic  ?   ? Multiple  ?0  ? Live Births  ?3  ?   ?  ?  ? ? ? ?Home Medications   ? ?Prior to Admission medications   ?Medication Sig Start Date End Date Taking? Authorizing Provider  ?amoxicillin (AMOXIL) 500 MG tablet Take 1 tablet (500 mg total) by mouth 2 (two) times daily for 7 days. 01/26/22 02/02/22 Yes Rudolpho Sevin, NP  ?benzonatate (TESSALON PERLES) 100 MG capsule Take 1 capsule (100 mg total) by mouth 3 (three) times daily as needed for cough. 01/26/22 01/26/23 Yes Rudolpho Sevin, NP  ?cyclobenzaprine (FLEXERIL) 5 MG tablet Take 1 tablet (5 mg total) by mouth 3 (three) times daily as needed for muscle spasms. 01/15/22   Gildardo Pounds, NP  ?Dapsone (ACZONE) 5 % topical gel Apply topically 2 times per day 09/20/21   Gildardo Pounds, NP  ?gabapentin (NEURONTIN) 300 MG capsule Take 1 capsule (300 mg total) by mouth 3 (three) times daily. 09/20/21 12/14/21  Gildardo Pounds, NP  ?loratadine (CLARITIN) 10 MG tablet Take 1 tablet (10 mg total) by mouth daily. cough 11/14/21  Gildardo Pounds, NP  ?mometasone-formoterol (DULERA) 100-5 MCG/ACT AERO Inhale 2 puffs into the lungs 2 (two) times daily. 11/14/21   Gildardo Pounds, NP  ?pantoprazole (PROTONIX) 40 MG tablet Take 1 tablet (40 mg total) by mouth daily. 11/14/21   Gildardo Pounds, NP  ?Prenatal Vit-Fe Fumarate-FA (PRENATAL VITAMINS) 28-0.8 MG TABS Take 1 tablet by mouth daily. 06/01/21   Leftwich-[name], Kathie Dike, CNM  ?tretinoin (RETIN-A) 0.025 % cream Apply topically at bedtime. 11/13/21   Gildardo Pounds, NP  ? ? ?Family History ?Family History  ?Problem Relation Age of Onset  ? Diabetes Mother   ? Diabetes Sister   ? Diabetes Brother   ? Hypertension Brother   ? Diabetes Sister   ? Diabetes Sister   ? Autism Son   ? Healthy Son   ? Healthy Daughter   ? Other Neg Hx   ? ? ?Social History ?Social History  ? ?Tobacco Use  ? Smoking status: Never  ? Smokeless tobacco: Never  ?Vaping Use  ? Vaping  Use: Never used  ?Substance Use Topics  ? Alcohol use: No  ? Drug use: No  ? ? ? ?Allergies   ?Other ? ? ?Review of Systems ?As stated in hpi otherwise negative ? ? ?Physical Exam ?Triage Vital Signs ?ED Triage Vitals [01/26/22 1917]  ?Enc Vitals Group  ?   BP 102/77  ?   Pulse Rate 87  ?   Resp 18  ?   Temp 97.8 ?F (36.6 ?C)  ?   Temp Source Oral  ?   SpO2 98 %  ?   Weight   ?   Height   ?   Head Circumference   ?   Peak Flow   ?   Pain Score   ?   Pain Loc   ?   Pain Edu?   ?   Excl. in [place]?   ? ?No data found. ? ?Updated Vital Signs ?BP 102/77 (BP Location: Right Arm)   Pulse 87   Temp 97.8 ?F (36.6 ?C) (Oral)   Resp 18   LMP 01/15/2022 (Approximate)   SpO2 98%  ? ?Visual Acuity ?Right Eye Distance:   ?Left Eye Distance:   ?Bilateral Distance:   ? ?Right Eye Near:   ?Left Eye Near:    ?Bilateral Near:    ? ?Physical Exam ?Constitutional:   ?   General: She is not in acute distress. ?   Appearance: She is well-developed. She is not ill-appearing or toxic-appearing.  ?HENT:  ?   Head:  ?   Comments: Exquisitely tender upon palpation of left maxillary and frontal sinus ?   Mouth/Throat:  ?   Mouth: Mucous membranes are moist.  ?   Pharynx: Oropharynx is clear. No pharyngeal swelling or oropharyngeal exudate.  ?Eyes:  ?   General: No scleral icterus. ?   Extraocular Movements: Extraocular movements intact.  ?Cardiovascular:  ?   Rate and Rhythm: Normal rate and regular rhythm.  ?   Heart sounds: No murmur heard. ?  No friction rub. No gallop.  ?Pulmonary:  ?   Effort: Pulmonary effort is normal.  ?   Breath sounds: Normal breath sounds. No wheezing, rhonchi or rales.  ?Musculoskeletal:     ?   General: Normal range of motion.  ?Skin: ?   General: Skin is warm and dry.  ?   Findings: No rash.  ?Neurological:  ?   General: No focal deficit present.  ?  Mental Status: She is alert and oriented to person, place, and time.  ?Psychiatric:     ?   Mood and Affect: Mood normal.     ?   Behavior: Behavior normal.   ? ? ? ?UC Treatments / Results  ?Labs ?(all labs ordered are listed, but only abnormal results are displayed) ?Labs Reviewed - No data to display ? ?EKG ? ? ?Radiology ?No results found. ? ?Procedures ?Procedures (including critical care time) ? ?Medications Ordered in UC ?Medications - No data to display ? ?Initial Impression / Assessment and Plan / UC Course  ?I have reviewed the triage vital signs and the nursing notes. ? ?Pertinent labs & imaging results that were available during my care of the patient were reviewed by me and considered in my medical decision making (see chart for details). ? ?Acute sinusitis ?-suspicious for allergic rhinitis with transition to bacterial infection. Pt afebrile and NT appearing ?-Amox BID x 7 days, tessalon prn ?-start daily Zyrtec ? ?Reviewed expections re: course of current medical issues. Questions answered. ?Outlined signs and symptoms indicating need for more acute intervention. ?Pt verbalized understanding. ?AVS given ? ?Final Clinical Impressions(s) / UC Diagnoses  ? ?Final diagnoses:  ?Acute non-recurrent maxillary sinusitis  ? ? ? ?Discharge Instructions   ? ?  ?I am treating you for a sinus infection.  Take antibiotic as prescribed.  I would also like you to start taking Zyrtec or the generic cetirizine.  I am also prescribing you cough suppressant to use as needed.  Please return or follow-up with your primary doctor for any worsening or persistent symptoms. ? ? ? ? ?ED Prescriptions   ? ? Medication Sig Dispense Auth. Provider  ? amoxicillin (AMOXIL) 500 MG tablet Take 1 tablet (500 mg total) by mouth 2 (two) times daily for 7 days. 14 tablet Rudolpho Sevin, NP  ? benzonatate (TESSALON PERLES) 100 MG capsule Take 1 capsule (100 mg total) by mouth 3 (three) times daily as needed for cough. 30 capsule Rudolpho Sevin, NP  ? ?  ? ?PDMP not reviewed this encounter. ?  ?Rudolpho Sevin, NP ?01/27/22 1145 ? ?

## 2022-01-26 NOTE — Discharge Instructions (Addendum)
I am treating you for a sinus infection.  Take antibiotic as prescribed.  I would also like you to start taking Zyrtec or the generic cetirizine.  I am also prescribing you cough suppressant to use as needed.  Please return or follow-up with your primary doctor for any worsening or persistent symptoms. ?

## 2022-01-26 NOTE — ED Triage Notes (Signed)
Pt c/o cough since Monday getting up phlegm. Reports for 3 days will cough so hard that makes her vomit. Today having upper abd pains due to coughing so hard.  ?

## 2022-01-29 ENCOUNTER — Ambulatory Visit: Payer: Self-pay | Admitting: *Deleted

## 2022-01-29 NOTE — Telephone Encounter (Signed)
?  Chief Complaint: dehydration ?Symptoms: sore throat, cough, headache, vomiting  ?Frequency: 1 week or more ?Pertinent Negatives: Ppt reports she has fallen 2x d/t weakness  ?Disposition: '[x]'$ ED /'[]'$ Urgent Care (no appt availability in office) / '[]'$ Appointment(In office/virtual)/ '[]'$  [facility]/ '[]'$ Home Care/ '[]'$ Refused Recommended Disposition /'[]'$ [facility]/ '[]'$  Follow-up with PCP ?Additional Notes: pt went to UC on 01/26/22 for sore throat and got medications. She states it helps the cough some but still vomiting and feels so weak and she keeps falling d/t the weakness and unable to go to work. Pt is asking for drs note for work so she isn't fired. She states she drinks sips of water but unable to keep it down d/t the coughing she vomits it up. I advised pt she needed to go to ED to be seen for dehydration and they would be able to assist her with a note for work. Pt verbalized understanding.  ? ?Reason for Disposition ? [1] Drinking very little AND [2] dehydration suspected (e.g., no urine > 12 hours, very dry mouth, very lightheaded) ? ?Answer Assessment - Initial Assessment Questions ?1. ONSET: "When did the throat start hurting?" (Hours or days ago)  ?    1 week  ?2. SEVERITY: "How bad is the sore throat?" (Scale 1-10; mild, moderate or severe) ?  - MILD (1-3):  doesn't interfere with eating or normal activities ?  - MODERATE (4-7): interferes with eating some solids and normal activities ?  - SEVERE (8-10):  excruciating pain, interferes with most normal activities ?  - SEVERE DYSPHAGIA: can't swallow liquids, drooling ?    Severe  ?4.  VIRAL SYMPTOMS: "Are there any symptoms of a cold, such as a runny nose, cough, hoarse voice or red eyes?"  ?    Cough, hoarse  ?7. OTHER SYMPTOMS: "Do you have any other symptoms?" (e.g., difficulty breathing, headache, rash) ?    Headache, cough, vomiting, fell 2x d/t weakness ? ?Protocols used: Sore Throat-A-AH ? ?

## 2022-01-29 NOTE — Telephone Encounter (Signed)
Summary: Flu like symptoms  ? Pt called and stated that she is having throat pain/cough. Has not been eating. Not taking fluids well. Vomiting  ?  ?Attempted to call patient regarding symptoms- left message on voice mail to call office  ?

## 2022-01-29 NOTE — Telephone Encounter (Signed)
Second attempt to reach patient- left message to call office °

## 2022-02-11 ENCOUNTER — Other Ambulatory Visit: Payer: Self-pay | Admitting: Nurse Practitioner

## 2022-02-11 DIAGNOSIS — G629 Polyneuropathy, unspecified: Secondary | ICD-10-CM

## 2022-03-01 NOTE — Progress Notes (Deleted)
Office Visit Note  [name]             Date of Birth: 07-03-1981           MRN: [id number]             PCP: Gildardo Pounds, NP Referring: Gildardo Pounds, NP Visit Date: 03/07/2022 Occupation: '@GUAROCC'$ @  Subjective:  No chief complaint on file.   History of Present Illness: Michaela Moran is a 41 y.o. female ***returns today after the last visit on January 12, 2020.  Activities of Daily Living:  Patient reports morning stiffness for *** {minute/hour:19697}.   Patient {ACTIONS;DENIES/REPORTS:21021675::"Denies"} nocturnal pain.  Difficulty dressing/grooming: {ACTIONS;DENIES/REPORTS:21021675::"Denies"} Difficulty climbing stairs: {ACTIONS;DENIES/REPORTS:21021675::"Denies"} Difficulty getting out of chair: {ACTIONS;DENIES/REPORTS:21021675::"Denies"} Difficulty using hands for taps, buttons, cutlery, and/or writing: {ACTIONS;DENIES/REPORTS:21021675::"Denies"}  No Rheumatology ROS completed.   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/28/2016   Language barrier, cultural differences 04/28/2016    Past Medical History:  Diagnosis Date   Acid reflux    Female circumcision    Hemorrhoid    History of positive PPD    neg CXR   Hypercholesteremia    Previous cesarean delivery, antepartum condition or complication 03/29/4579   History of C/S x 2     S/P repeat low transverse C-section 06/27/2016    Family History  Problem Relation Age of Onset   Diabetes Mother    Diabetes Sister    Diabetes Brother    Hypertension Brother    Diabetes Sister    Diabetes Sister    Autism Son    Healthy Son    Healthy Daughter    Other Neg Hx    Past Surgical History:  Procedure Laterality Date   ARTERY BIOPSY Right 02/17/2021   Procedure: RIGHT  TEMPORAL ARTERY BIOPSY;  Surgeon: Angelia Mould, MD;  Location: [facility];  Service: Vascular;  Laterality: Right;   [place] N/A 01/02/2013   Procedure: CESAREAN SECTION;  Surgeon: Donnamae Jude, MD;  Location: [facility];  Service: Obstetrics;  Laterality: N/A;  Incidental Cyystotomy   CESAREAN SECTION N/A 06/26/2016   Procedure: CESAREAN SECTION;  Surgeon: Aletha Halim, MD;  Location: [facility];  Service: Obstetrics;  Laterality: N/A;   CYSTOTOMY     with repair at 2014 repeat c-section   Social History   Social History Narrative   Not on file   Immunization History  Administered Date(s) Administered   Influenza,inj,Quad PF,6+ Mos 06/28/2016, 08/30/2020, 09/20/2021   PFIZER(Purple Top)SARS-COV-2 Vaccination 01/26/2020, 02/16/2020     Objective: Vital Signs: There were no vitals taken for this visit.   Physical Exam   Musculoskeletal Exam: ***  CDAI Exam: CDAI Score: -- Patient Global: --; Provider Global: -- Swollen: --; Tender: -- Joint Exam 03/07/2022   No joint exam has been documented for this visit   There is currently no information documented on the homunculus. Go to the Rheumatology activity and complete the homunculus joint exam.  Investigation: No additional findings.  Imaging: No results found.  Recent Labs: Lab Results  Component Value Date   WBC 4.9 06/03/2021   HGB 13.7 06/03/2021   PLT 316 06/03/2021   NA 141 01/15/2022   K 4.1 01/15/2022  CL 106 01/15/2022   CO2 24 01/15/2022   GLUCOSE 102 (H) 01/15/2022   BUN 11 01/15/2022   CREATININE 0.82 01/15/2022   BILITOT <0.2 01/15/2022   ALKPHOS 86 01/15/2022   AST 12 01/15/2022   ALT 13 01/15/2022   PROT 6.8 01/15/2022   ALBUMIN 4.0 01/15/2022   CALCIUM 8.9 01/15/2022   GFRAA 127 05/03/2020    Speciality Comments: No specialty comments available.  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  No orders of the defined types were placed in this encounter.   Face-to-face time spent with patient was *** minutes. Greater than 50% of time was spent in counseling and coordination of care.  Follow-Up Instructions: No follow-ups on file.   Bo Merino, MD  Note - This record has been created using Editor, commissioning.  Chart creation errors have been sought, but may not always  have been located. Such creation errors do not reflect on  the standard of medical care.

## 2022-03-07 ENCOUNTER — Ambulatory Visit: Payer: Medicaid Other | Admitting: Internal Medicine

## 2022-03-07 ENCOUNTER — Ambulatory Visit: Payer: Medicaid Other | Admitting: Rheumatology

## 2022-03-07 DIAGNOSIS — M5136 Other intervertebral disc degeneration, lumbar region: Secondary | ICD-10-CM

## 2022-03-07 DIAGNOSIS — R768 Other specified abnormal immunological findings in serum: Secondary | ICD-10-CM

## 2022-03-07 DIAGNOSIS — Z603 Acculturation difficulty: Secondary | ICD-10-CM

## 2022-03-07 DIAGNOSIS — G8929 Other chronic pain: Secondary | ICD-10-CM

## 2022-04-24 ENCOUNTER — Ambulatory Visit: Payer: Medicaid Other | Admitting: Allergy & Immunology

## 2022-05-02 ENCOUNTER — Ambulatory Visit: Payer: Medicaid Other | Admitting: Critical Care Medicine

## 2022-05-30 ENCOUNTER — Encounter: Payer: Self-pay | Admitting: Allergy

## 2022-05-30 ENCOUNTER — Ambulatory Visit (INDEPENDENT_AMBULATORY_CARE_PROVIDER_SITE_OTHER): Payer: Medicaid Other | Admitting: Allergy

## 2022-05-30 ENCOUNTER — Other Ambulatory Visit: Payer: Self-pay

## 2022-05-30 VITALS — BP 118/78 | HR 75 | Temp 98.0°F | Resp 16 | Ht 65.75 in | Wt 203.2 lb

## 2022-05-30 DIAGNOSIS — J4520 Mild intermittent asthma, uncomplicated: Secondary | ICD-10-CM

## 2022-05-30 DIAGNOSIS — J301 Allergic rhinitis due to pollen: Secondary | ICD-10-CM | POA: Diagnosis not present

## 2022-05-30 DIAGNOSIS — H1013 Acute atopic conjunctivitis, bilateral: Secondary | ICD-10-CM

## 2022-05-30 DIAGNOSIS — J45998 Other asthma: Secondary | ICD-10-CM | POA: Diagnosis not present

## 2022-05-30 MED ORDER — DULERA 100-5 MCG/ACT IN AERO: 2.0000 | INHALATION_SPRAY | Freq: Two times a day (BID) | RESPIRATORY_TRACT | 5 refills | Status: DC

## 2022-05-30 MED ORDER — ALBUTEROL SULFATE HFA 108 (90 BASE) MCG/ACT IN AERS: 2.0000 | INHALATION_SPRAY | Freq: Four times a day (QID) | RESPIRATORY_TRACT | 1 refills | Status: DC | PRN

## 2022-05-30 MED ORDER — CETIRIZINE HCL 10 MG PO TABS: 10.0000 mg | ORAL_TABLET | Freq: Every day | ORAL | 5 refills | Status: DC

## 2022-05-30 NOTE — Patient Instructions (Signed)
-   Testing today showed: weeds and trees. - Copy of test results provided.  - Avoidance measures provided. - Can take the following as needed: Zyrtec (cetirizine) '10mg'$  tablet once daily.  This is a long-acting antihistamine the should not cause drowsiness.  Dymista or its components separately (fluticasone/azelastine) two sprays per nostril 1-2 times daily as needed for nasal stuffiness or drainage.  Pataday (olopatadine) one drop per eye  daily as needed for itchy/watery eyes.  - You can use an extra dose of the antihistamine, if needed, for breakthrough symptoms.  - Consider allergy shots as a means of long-term control. - Allergy shots "re-train" and "reset" the immune system to ignore environmental allergens and decrease the resulting immune response to those allergens (sneezing, itchy watery eyes, runny nose, nasal congestion, etc).    - Allergy shots improve symptoms in 80-85% of patients.  - We can discuss more at the next appointment if the medications are not working for you.  -  Lung function testing looks great today and is normal . - Daily controller medication(s) to help stabilize your airway so it is less prone to react to allergens or irritant (smoke, strong odors, perfumes etc): Dulera 100/2mg two puffs twice daily with spacer - Rescue medications: albuterol 2 puffs every 4-6 hours as needed for cough/wheeze/shortness of breath/chest tightness.  May use 15-20 minutes prior to activity.   Monitor frequency of use.    - Breathing control goals:  * Full participation in all desired activities (may need albuterol before activity) * Albuterol use two time or less a week on average (not counting use with activity) * Cough interfering with sleep two time or less a month * Oral steroids no more than once a year * No hospitalizations  Follow-up in 3-4 months or sooner if needed

## 2022-05-30 NOTE — Progress Notes (Signed)
New Patient Note  RE: Michaela Moran MRN: [id number] DOB: May 22, 1981 Date of Office Visit: 05/30/2022   Primary care provider: Gildardo Pounds, NP  Chief Complaint: allergies  History of present illness: Michaela Moran is a 41 y.o. female presenting today for evaluation of allergic rhinitis.  Language interpreter used via online services.   Since 2020 she has been having issues with congestion, itchy eyes, itchy ears, cough, shortness of breath, headache, throat clear often.  She states she has episodic cough.  She states she has had to leave work early due to these symptoms and feels something there she is allergic too.  Dust bothers her the most it seems.  Having the fan on also triggers symptoms.  Cleaning products, the smell, seem to worsen these symptoms.  The winter season the cough can be worse.  The sinus allergy symptoms the spring and summer are worse.  She states the house she lives in is old and they are looking for a new home.  She takes benadryl but makes her sleepy.   She does have a Dulera inhaler to help with her breathing issues and it did help. However she is not interested in taking medications to take medications but would like to know what is causing her symptoms.   No history of eczema or food allergy.    Review of systems: Review of Systems  Constitutional: Negative.   HENT:         See HPI  Eyes:        See HPI  Respiratory: Negative.    Cardiovascular: Negative.   Gastrointestinal: Negative.   Musculoskeletal: Negative.   Skin: Negative.   Allergic/Immunologic: Negative.   Neurological: Negative.     All other systems negative unless noted above in HPI  Past medical history: Past Medical History:  Diagnosis Date   Acid reflux    Female circumcision    Hemorrhoid    History of positive PPD    neg CXR   Hypercholesteremia    Previous cesarean delivery, antepartum condition or complication 3/57/0177   History of C/S x 2     S/P repeat low  transverse C-section 06/27/2016    Past surgical history: Past Surgical History:  Procedure Laterality Date   ARTERY BIOPSY Right 02/17/2021   Procedure: RIGHT TEMPORAL ARTERY BIOPSY;  Surgeon: Angelia Mould, MD;  Location: [facility];  Service: Vascular;  Laterality: Right;   CESAREAN SECTION     CESAREAN SECTION N/A 01/02/2013   Procedure: CESAREAN SECTION;  Surgeon: Donnamae Jude, MD;  Location: [facility];  Service: Obstetrics;  Laterality: N/A;  Incidental Cyystotomy   CESAREAN SECTION N/A 06/26/2016   Procedure: CESAREAN SECTION;  Surgeon: Aletha Halim, MD;  Location: [facility];  Service: Obstetrics;  Laterality: N/A;   CYSTOTOMY     with repair at 2014 repeat c-section    Family history:  Family History  Problem Relation Age of Onset   Diabetes Mother    Diabetes Sister    Diabetes Brother    Hypertension Brother    Diabetes Sister    Diabetes Sister    Autism Son    Healthy Son    Healthy Daughter    Other Neg Hx     Social history: Lives in a townhome without carpeting with electric heating and central cooling.  No pets in the home.  No concern for roaches in the home.  There is concern for water damage, mildew in the home.  She works in a Network engineer.  Denies smoking history.    Medication List: Current Outpatient Medications  Medication Sig Dispense Refill   albuterol (VENTOLIN HFA) 108 (90 Base) MCG/ACT inhaler Inhale 2 puffs into the lungs every 6 (six) hours as needed for wheezing or shortness of breath. 18 g 1   cetirizine (ZYRTEC) 10 MG tablet Take 1 tablet (10 mg total) by mouth daily. 30 tablet 5   cyclobenzaprine (FLEXERIL) 5 MG tablet Take 1 tablet (5 mg total) by mouth 3 (three) times daily as needed for muscle spasms. 30 tablet 1   Dapsone (ACZONE) 5 % topical gel Apply topically 2 times per day 60 g 1   gabapentin (NEURONTIN) 300 MG capsule TAKE 1 CAPSULE(300 MG) BY MOUTH THREE TIMES DAILY 90 capsule 3   mometasone-formoterol  (DULERA) 100-5 MCG/ACT AERO Inhale 2 puffs into the lungs 2 (two) times daily. 8.8 g 3   mometasone-formoterol (DULERA) 100-5 MCG/ACT AERO Inhale 2 puffs into the lungs 2 (two) times daily. 1 each 5   pantoprazole (PROTONIX) 40 MG tablet Take 1 tablet (40 mg total) by mouth daily. 30 tablet 3   Prenatal Vit-Fe Fumarate-FA (PRENATAL VITAMINS) 28-0.8 MG TABS Take 1 tablet by mouth daily. 30 tablet 5   tretinoin (RETIN-A) 0.025 % cream Apply topically at bedtime. 45 g 1   benzonatate (TESSALON PERLES) 100 MG capsule Take 1 capsule (100 mg total) by mouth 3 (three) times daily as needed for cough. (Patient not taking: Reported on 05/30/2022) 30 capsule 0   loratadine (CLARITIN) 10 MG tablet Take 1 tablet (10 mg total) by mouth daily. cough (Patient not taking: Reported on 05/30/2022) 30 tablet 11   No current facility-administered medications for this visit.    Known medication allergies: Allergies  Allergen Reactions   Other Swelling    Allergic to peas.     Physical examination: There were no vitals taken for this visit.  General: Alert, interactive, in no acute distress. HEENT: PERRLA, TMs pearly gray, turbinates minimally edematous without discharge, post-pharynx non erythematous. Neck: Supple without lymphadenopathy. Lungs: Clear to auscultation without wheezing, rhonchi or rales. {no increased work of breathing. CV: Normal S1, S2 without murmurs. Abdomen: Nondistended, nontender. Skin: Warm and dry, without lesions or rashes. Extremities:  No clubbing, cyanosis or edema. Neuro:   Grossly intact.  Diagnositics/Labs:  Spirometry: FEV1: 2.21L 92%, FVC: 2.57L 92%, ratio consistent with nonobstructive pattern  Allergy testing:   Airborne Adult Perc - 05/30/22 1516     Time Antigen Placed 1520    Allergen Manufacturer Lavella Hammock    Location Back    Number of Test 59    Panel 1 Select    1. Control-Buffer 50% Glycerol Negative    2. Control-Histamine 1 mg/ml 2+    3. Albumin saline  Negative    4. [place] Negative    5. [place] Negative    6. Johnson Negative    7. [place] Blue Negative    8. Meadow Fescue Negative    9. Perennial Rye Negative    10. Sweet Vernal Negative    11. Timothy Negative    12. Cocklebur Negative    13. Burweed Marshelder Negative    14. Ragweed, short Negative    15. Ragweed, Giant Negative    16. Plantain,  English Negative    17. Lamb's Quarters Negative    18. Sheep Sorrell Negative    19. Rough Pigweed Negative    20. Skeet Simmer Elder, Rough Negative  21. Mugwort, Common 2+    22. Ash mix 2+    23. Birch mix Negative    24. Beech American Negative    25. Box, Elder Negative    26. Cedar, red Negative    27. Cottonwood, [place] Negative    28. Elm mix Negative    29. Hickory Negative    30. Maple mix Negative    31. Oak, [place] mix Negative    32. Pecan Pollen Negative    33. Pine mix 2+    34. Sycamore Eastern Negative    35. Rienzi, Black Pollen Negative    36. Alternaria alternata Negative    37. Cladosporium Herbarum Negative    38. Aspergillus mix Negative    39. Penicillium mix Negative    40. Bipolaris sorokiniana (Helminthosporium) Negative    41. Drechslera spicifera (Curvularia) Negative    42. Mucor plumbeus Negative    43. Fusarium moniliforme Negative    44. Aureobasidium pullulans (pullulara) Negative    45. Rhizopus oryzae Negative    46. Botrytis cinera Negative    47. Epicoccum nigrum Negative    48. Phoma betae Negative    49. Candida Albicans Negative    50. Trichophyton mentagrophytes Negative    51. Mite, D Farinae  5,000 AU/ml Negative    52. Mite, D Pteronyssinus  5,000 AU/ml Negative    53. Cat Hair 10,000 BAU/ml Negative    54.  Dog Epithelia Negative    55. Mixed Feathers Negative    56. Horse Epithelia Negative    57. Cockroach, German Negative    58. Mouse Negative    59. Tobacco Leaf Negative             Allergy testing results were read and interpreted by provider, documented  by clinical staff.   Assessment and plan: allergic rhinitis with conjunctivitis  - Testing today showed: weeds and trees. - Copy of test results provided.  - Avoidance measures provided. - Can take the following as needed: Zyrtec (cetirizine) '10mg'$  tablet once daily.  This is a long-acting antihistamine the should not cause drowsiness.  Dymista or its components separately (fluticasone/azelastine) two sprays per nostril 1-2 times daily as needed for nasal stuffiness or drainage.  Pataday (olopatadine) one drop per eye  daily as needed for itchy/watery eyes.  - You can use an extra dose of the antihistamine, if needed, for breakthrough symptoms.  - Consider allergy shots as a means of long-term control. - Allergy shots "re-train" and "reset" the immune system to ignore environmental allergens and decrease the resulting immune response to those allergens (sneezing, itchy watery eyes, runny nose, nasal congestion, etc).    - Allergy shots improve symptoms in 80-85% of patients.  - We can discuss more at the next appointment if the medications are not working for you.  Reactive airway -  Lung function testing looks great today and is normal . - Daily controller medication(s) to help stabilize your airway so it is less prone to react to allergens or irritant (smoke, strong odors, perfumes etc): Dulera 100/49mg two puffs twice daily with spacer - Rescue medications: albuterol 2 puffs every 4-6 hours as needed for cough/wheeze/shortness of breath/chest tightness.  May use 15-20 minutes prior to activity.   Monitor frequency of use.    - Breathing control goals:  * Full participation in all desired activities (may need albuterol before activity) * Albuterol use two time or less a week on average (not counting use with activity) *  Cough interfering with sleep two time or less a month * Oral steroids no more than once a year * No hospitalizations  Follow-up in 3-4 months or sooner if needed  I  appreciate the opportunity to take part in Laterrica's care. Please do not hesitate to contact me with questions.  Sincerely,   Prudy Feeler, MD Allergy/Immunology [facility]

## 2022-07-02 ENCOUNTER — Ambulatory Visit: Payer: Self-pay | Admitting: *Deleted

## 2022-07-02 NOTE — Telephone Encounter (Signed)
  Chief Complaint: left sided abd/stomach pain for 2 months Symptoms: above Frequency: Intermittent Pertinent Negatives: Patient denies diarrhea or vomiting Disposition: '[]'$ ED /'[]'$ Urgent Care (no appt availability in office) / '[x]'$ Appointment(In office/virtual)/ '[]'$  [facility]/ '[]'$ Home Care/ '[]'$ Refused Recommended Disposition /'[]'$ [facility]/ '[]'$  Follow-up with PCP Additional Notes: Appt made with Geryl Rankins, NP for this issue and also for her yearly physical.

## 2022-07-02 NOTE — Telephone Encounter (Signed)
Reason for Disposition  [1] MILD pain (e.g., does not interfere with normal activities) AND [2] pain comes and goes (cramps) AND [3] present > 48 hours  (Exception: This same abdominal pain is a chronic symptom recurrent or ongoing AND present > 4 weeks.)  Answer Assessment - Initial Assessment Questions 1. LOCATION: "Where does it hurt?"      Having pain in left abd and stomach. 2. RADIATION: "Does the pain shoot anywhere else?" (e.g., chest, back)     No 3. ONSET: "When did the pain begin?" (e.g., minutes, hours or days ago)      2 months ago 4. SUDDEN: "Gradual or sudden onset?"     Not asked 5. PATTERN "Does the pain come and go, or is it constant?"    - If it comes and goes: "How long does it last?" "Do you have pain now?"     (Note: Comes and goes means the pain is intermittent. It goes away completely between bouts.)    - If constant: "Is it getting better, staying the same, or getting worse?"      (Note: Constant means the pain never goes away completely; most serious pain is constant and gets worse.)      It's intermittent 6. SEVERITY: "How bad is the pain?"  (e.g., Scale 1-10; mild, moderate, or severe)    - MILD (1-3): Doesn't interfere with normal activities, abdomen soft and not tender to touch.     - MODERATE (4-7): Interferes with normal activities or awakens from sleep, abdomen tender to touch.     - SEVERE (8-10): Excruciating pain, doubled over, unable to do any normal activities.       Mild 7. RECURRENT SYMPTOM: "Have you ever had this type of stomach pain before?" If Yes, ask: "When was the last time?" and "What happened that time?"      No 8. CAUSE: "What do you think is causing the stomach pain?"     Not asked 9. RELIEVING/AGGRAVATING FACTORS: "What makes it better or worse?" (e.g., antacids, bending or twisting motion, bowel movement)     Not asked  10. OTHER SYMPTOMS: "Do you have any other symptoms?" (e.g., back pain, diarrhea, fever, urination pain, vomiting)        No diarrhea or vomiting.    11. PREGNANCY: "Is there any chance you are pregnant?" "When was your last menstrual period?"       No  Protocols used: Abdominal Pain - [facility]

## 2022-08-14 ENCOUNTER — Ambulatory Visit: Payer: Medicaid Other | Admitting: Nurse Practitioner

## 2022-08-17 ENCOUNTER — Encounter: Payer: Medicaid Other | Admitting: Nurse Practitioner

## 2022-08-29 ENCOUNTER — Ambulatory Visit: Payer: Medicaid Other | Admitting: Allergy

## 2022-09-26 ENCOUNTER — Emergency Department (HOSPITAL_BASED_OUTPATIENT_CLINIC_OR_DEPARTMENT_OTHER): Payer: Medicaid Other

## 2022-09-26 ENCOUNTER — Other Ambulatory Visit: Payer: Self-pay

## 2022-09-26 ENCOUNTER — Encounter (HOSPITAL_BASED_OUTPATIENT_CLINIC_OR_DEPARTMENT_OTHER): Payer: Self-pay | Admitting: Emergency Medicine

## 2022-09-26 ENCOUNTER — Emergency Department (HOSPITAL_BASED_OUTPATIENT_CLINIC_OR_DEPARTMENT_OTHER)
Admission: EM | Admit: 2022-09-26 | Discharge: 2022-09-26 | Disposition: A | Discharge status: Home / Self Care | Payer: Medicaid Other | Attending: Emergency Medicine | Admitting: Emergency Medicine

## 2022-09-26 DIAGNOSIS — M545 Low back pain, unspecified: Secondary | ICD-10-CM | POA: Insufficient documentation

## 2022-09-26 DIAGNOSIS — R1012 Left upper quadrant pain: Secondary | ICD-10-CM | POA: Diagnosis not present

## 2022-09-26 DIAGNOSIS — R109 Unspecified abdominal pain: Secondary | ICD-10-CM | POA: Diagnosis not present

## 2022-09-26 LAB — CBC WITH DIFFERENTIAL/PLATELET
Abs Immature Granulocytes: 0.01 10*3/uL (ref 0.00–0.07)
Basophils Absolute: 0 10*3/uL (ref 0.0–0.1)
Basophils Relative: 1 %
Eosinophils Absolute: 0.1 10*3/uL (ref 0.0–0.5)
Eosinophils Relative: 2 %
HCT: 39.8 % (ref 36.0–46.0)
Hemoglobin: 13.5 g/dL (ref 12.0–15.0)
Immature Granulocytes: 0 %
Lymphocytes Relative: 31 %
Lymphs Abs: 1.9 10*3/uL (ref 0.7–4.0)
MCH: 30.3 pg (ref 26.0–34.0)
MCHC: 33.9 g/dL (ref 30.0–36.0)
MCV: 89.4 fL (ref 80.0–100.0)
Monocytes Absolute: 0.6 10*3/uL (ref 0.1–1.0)
Monocytes Relative: 9 %
Neutro Abs: 3.6 10*3/uL (ref 1.7–7.7)
Neutrophils Relative %: 57 %
Platelets: 331 10*3/uL (ref 150–400)
RBC: 4.45 MIL/uL (ref 3.87–5.11)
RDW: 13.1 % (ref 11.5–15.5)
WBC: 6.2 10*3/uL (ref 4.0–10.5)
nRBC: 0 % (ref 0.0–0.2)

## 2022-09-26 LAB — COMPREHENSIVE METABOLIC PANEL
ALT: 15 U/L (ref 0–44)
AST: 18 U/L (ref 15–41)
Albumin: 3.8 g/dL (ref 3.5–5.0)
Alkaline Phosphatase: 77 U/L (ref 38–126)
Anion gap: 9 (ref 5–15)
BUN: 14 mg/dL (ref 6–20)
CO2: 24 mmol/L (ref 22–32)
Calcium: 8.9 mg/dL (ref 8.9–10.3)
Chloride: 102 mmol/L (ref 98–111)
Creatinine, Ser: 0.85 mg/dL (ref 0.44–1.00)
GFR, Estimated: >60 mL/min (ref >60)
Glucose, Bld: 98 mg/dL (ref 70–99)
Potassium: 3.8 mmol/L (ref 3.5–5.1)
Sodium: 135 mmol/L (ref 135–145)
Total Bilirubin: 0.3 mg/dL (ref 0.3–1.2)
Total Protein: 7.5 g/dL (ref 6.5–8.1)

## 2022-09-26 LAB — URINALYSIS, ROUTINE W REFLEX MICROSCOPIC
Bilirubin Urine: NEGATIVE
Glucose, UA: NEGATIVE mg/dL
Ketones, ur: NEGATIVE mg/dL
Leukocytes,Ua: NEGATIVE
Nitrite: NEGATIVE
Protein, ur: NEGATIVE mg/dL
Specific Gravity, Urine: 1.01 (ref 1.005–1.030)
pH: 6 (ref 5.0–8.0)

## 2022-09-26 LAB — LIPASE, BLOOD: Lipase: 36 U/L (ref 11–51)

## 2022-09-26 LAB — URINALYSIS, MICROSCOPIC (REFLEX): WBC, UA: NONE SEEN WBC/hpf (ref 0–5)

## 2022-09-26 LAB — PREGNANCY, URINE: Preg Test, Ur: NEGATIVE

## 2022-09-26 MED ORDER — IOHEXOL 300 MG/ML  SOLN
100.0000 mL | Freq: Once | INTRAMUSCULAR | Status: AC | PRN
  Administered 2022-09-26: 100 mL via INTRAVENOUS
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
## 2022-09-26 NOTE — ED Notes (Signed)
Patient transported to X-ray 

## 2022-09-28 ENCOUNTER — Telehealth: Payer: Self-pay | Admitting: *Deleted

## 2022-09-28 NOTE — Patient Outreach (Signed)
  Care Coordination [facility] Note Transition Care Management Unsuccessful Follow-up Telephone Call  Date of discharge and from where:  09/26/22 from [facility] ED  Attempts:  1st Attempt  Reason for unsuccessful TCM follow-up call:  Left voice message   Lurena Joiner RN, [place] RN Care Coordinator

## 2022-10-02 ENCOUNTER — Encounter: Payer: Self-pay | Admitting: Nurse Practitioner

## 2022-10-02 ENCOUNTER — Ambulatory Visit: Payer: Medicaid Other | Attending: Nurse Practitioner | Admitting: Nurse Practitioner

## 2022-10-02 VITALS — BP 101/68 | HR 74 | Ht 66.0 in | Wt 203.8 lb

## 2022-10-02 DIAGNOSIS — L719 Rosacea, unspecified: Secondary | ICD-10-CM

## 2022-10-02 DIAGNOSIS — Z1231 Encounter for screening mammogram for malignant neoplasm of breast: Secondary | ICD-10-CM

## 2022-10-02 DIAGNOSIS — R21 Rash and other nonspecific skin eruption: Secondary | ICD-10-CM | POA: Diagnosis not present

## 2022-10-02 DIAGNOSIS — K219 Gastro-esophageal reflux disease without esophagitis: Secondary | ICD-10-CM | POA: Diagnosis not present

## 2022-10-02 DIAGNOSIS — R7303 Prediabetes: Secondary | ICD-10-CM

## 2022-10-02 DIAGNOSIS — K5904 Chronic idiopathic constipation: Secondary | ICD-10-CM

## 2022-10-02 MED ORDER — SENNOSIDES-DOCUSATE SODIUM 8.6-50 MG PO TABS: 2.0000 | ORAL_TABLET | Freq: Every day | ORAL | 5 refills | Status: DC

## 2022-10-02 MED ORDER — PANTOPRAZOLE SODIUM 40 MG PO TBEC: 40.0000 mg | DELAYED_RELEASE_TABLET | Freq: Every day | ORAL | 3 refills | Status: DC

## 2022-10-02 NOTE — Progress Notes (Signed)
Assessment & Plan:  Michaela Moran was seen today for abdominal pain.  Diagnoses and all orders for this visit:  GERD without esophagitis -     Ambulatory referral to Dermatology INSTRUCTIONS: Avoid GERD Triggers: acidic, spicy or fried foods, caffeine, coffee, sodas,  alcohol and chocolate.   -     pantoprazole (PROTONIX) 40 MG tablet; Take 1 tablet (40 mg total) by mouth daily. FOR ACID   Prediabetes -     Hemoglobin A1c Continue blood sugar control as discussed in office today, low carbohydrate diet, and regular physical exercise as tolerated, 150 minutes per week (30 min each day, 5 days per week, or 50 min 3 days per week).   Rosacea, acne -     Ambulatory referral to Dermatology  Facial rash -     Ambulatory referral to Dermatology  Breast cancer screening by mammogram -     MM DIGITAL SCREENING BILATERAL; Future  Chronic idiopathic constipation -     senna-docusate (SENOKOT-S) 8.6-50 MG tablet; Take 2 tablets by mouth daily. CONSTIPATION    Patient has been counseled on age-appropriate routine health concerns for screening and prevention. These are reviewed and up-to-date. Referrals have been placed accordingly. Immunizations are up-to-date or declined.    Subjective:   Chief Complaint  Patient presents with   Abdominal Pain   HPI Michaela Moran 42 y.o. female presents to office today with abdominal pain. She has a history of GERD and has not been taking pantoprazole as prescribed  Abdominal Pain: Patient complains of abdominal pain. The pain is described as burning. Pain is located in the LUQ without radiation. Onset was several months ago. Symptoms have been unchanged since. Aggravating factors: spicy foods and sweets and sodas.  Alleviating factors: prune juice. Associated symptoms: constipation. The patient denies diarrhea, hematochezia, melena, nausea, and vomiting.     Review of Systems  Constitutional:  Negative for fever, malaise/fatigue and weight loss.  HENT:  Negative.  Negative for nosebleeds.   Eyes: Negative.  Negative for blurred vision, double vision and photophobia.  Respiratory: Negative.  Negative for cough and shortness of breath.   Cardiovascular: Negative.  Negative for chest pain, palpitations and leg swelling.  Gastrointestinal:  Positive for abdominal pain, constipation and heartburn. Negative for blood in stool, diarrhea, melena, nausea and vomiting.  Genitourinary:  Negative for dysuria, flank pain, frequency, hematuria and urgency.  Musculoskeletal: Negative.  Negative for myalgias.  Neurological: Negative.  Negative for dizziness, focal weakness, seizures and headaches.  Psychiatric/Behavioral: Negative.  Negative for suicidal ideas.     Past Medical History:  Diagnosis Date   Acid reflux    Female circumcision    Hemorrhoid    History of positive PPD    neg CXR   Hypercholesteremia    Previous cesarean delivery, antepartum condition or complication 09/17/7508   History of C/S x 2     S/P repeat low transverse C-section 06/27/2016    Past Surgical History:  Procedure Laterality Date   ARTERY BIOPSY Right 02/17/2021   Procedure: RIGHT TEMPORAL ARTERY BIOPSY;  Surgeon: Angelia Mould, MD;  Location: [place];  Service: Vascular;  Laterality: Right;   [place] N/A 01/02/2013   Procedure: CESAREAN SECTION;  Surgeon: Donnamae Jude, MD;  Location: [facility];  Service: Obstetrics;  Laterality: N/A;  Incidental Cyystotomy   CESAREAN SECTION N/A 06/26/2016   Procedure: CESAREAN SECTION;  Surgeon: Aletha Halim, MD;  Location: [place];  Service:  Obstetrics;  Laterality: N/A;   CYSTOTOMY     with repair at 2014 repeat c-section    Family History  Problem Relation Age of Onset   Diabetes Mother    Diabetes Sister    Diabetes Brother    Hypertension Brother    Diabetes Sister    Diabetes Sister    Autism Son    Healthy Son    Healthy Daughter    Other Neg Hx     Social History  Reviewed with no changes to be made today.   Outpatient Medications Prior to Visit  Medication Sig Dispense Refill   albuterol (VENTOLIN HFA) 108 (90 Base) MCG/ACT inhaler Inhale 2 puffs into the lungs every 6 (six) hours as needed for wheezing or shortness of breath. 18 g 1   Dapsone (ACZONE) 5 % topical gel Apply topically 2 times per day 60 g 1   loratadine (CLARITIN) 10 MG tablet Take 1 tablet (10 mg total) by mouth daily. cough 30 tablet 11   mometasone-formoterol (DULERA) 100-5 MCG/ACT AERO Inhale 2 puffs into the lungs 2 (two) times daily. 1 each 5   tretinoin (RETIN-A) 0.025 % cream Apply topically at bedtime. 45 g 1   benzonatate (TESSALON PERLES) 100 MG capsule Take 1 capsule (100 mg total) by mouth 3 (three) times daily as needed for cough. 30 capsule 0   cetirizine (ZYRTEC) 10 MG tablet Take 1 tablet (10 mg total) by mouth daily. 30 tablet 5   cyclobenzaprine (FLEXERIL) 5 MG tablet Take 1 tablet (5 mg total) by mouth 3 (three) times daily as needed for muscle spasms. 30 tablet 1   gabapentin (NEURONTIN) 300 MG capsule TAKE 1 CAPSULE(300 MG) BY MOUTH THREE TIMES DAILY 90 capsule 3   mometasone-formoterol (DULERA) 100-5 MCG/ACT AERO Inhale 2 puffs into the lungs 2 (two) times daily. 8.8 g 3   Prenatal Vit-Fe Fumarate-FA (PRENATAL VITAMINS) 28-0.8 MG TABS Take 1 tablet by mouth daily. 30 tablet 5   pantoprazole (PROTONIX) 40 MG tablet Take 1 tablet (40 mg total) by mouth daily. (Patient not taking: Reported on 10/02/2022) 30 tablet 3   No facility-administered medications prior to visit.    Allergies  Allergen Reactions   Other Swelling    Allergic to peas.       Objective:    BP 101/68   Pulse 74   Ht '5\' 6"'$  (1.676 m)   Wt 203 lb 12.8 oz (92.4 kg)   LMP 09/19/2022 (Approximate)   SpO2 98%   BMI 32.89 kg/m  Wt Readings from Last 3 Encounters:  10/02/22 203 lb 12.8 oz (92.4 kg)  05/30/22 203 lb 3.2 oz (92.2 kg)  01/15/22 198 lb (89.8 kg)    Physical Exam Vitals and  nursing note reviewed.  Constitutional:      Appearance: She is well-developed.  HENT:     Head: Normocephalic and atraumatic.  Cardiovascular:     Rate and Rhythm: Normal rate and regular rhythm.     Heart sounds: Normal heart sounds. No murmur heard.    No friction rub. No gallop.  Pulmonary:     Effort: Pulmonary effort is normal. No tachypnea or respiratory distress.     Breath sounds: Normal breath sounds. No decreased breath sounds, wheezing, rhonchi or rales.  Chest:     Chest wall: No tenderness.  Abdominal:     General: Bowel sounds are normal. There is no distension.     Palpations: Abdomen is soft. There is no mass.  Tenderness: There is no abdominal tenderness. There is no guarding or rebound.  Musculoskeletal:        General: Normal range of motion.     Cervical back: Normal range of motion.  Skin:    General: Skin is warm and dry.  Neurological:     Mental Status: She is alert and oriented to person, place, and time.     Coordination: Coordination normal.  Psychiatric:        Behavior: Behavior normal. Behavior is cooperative.        Thought Content: Thought content normal.        Judgment: Judgment normal.          Patient has been counseled extensively about nutrition and exercise as well as the importance of adherence with medications and regular follow-up. The patient was given clear instructions to go to ER or return to medical center if symptoms don't improve, worsen or new problems develop. The patient verbalized understanding.   Follow-up: Return in about 4 weeks (around 10/30/2022) for GERD can see me or angela if i dont have any openings.   Gildardo Pounds, FNP-BC [facility] and [facility] [place], [place]   10/02/2022, 9:11 AM

## 2022-10-02 NOTE — Progress Notes (Signed)
Left stomach pain. Started 4 months ago. Pain associated with eating cheese, soda, and spicy sauces.

## 2022-10-03 LAB — HEMOGLOBIN A1C
Est. average glucose Bld gHb Est-mCnc: 120 mg/dL
Hgb A1c MFr Bld: 5.8 % — ABNORMAL HIGH (ref 4.8–5.6)

## 2022-10-11 ENCOUNTER — Ambulatory Visit: Payer: Medicaid Other | Admitting: Allergy

## 2022-10-18 ENCOUNTER — Ambulatory Visit (INDEPENDENT_AMBULATORY_CARE_PROVIDER_SITE_OTHER): Payer: Medicaid Other | Admitting: Family Medicine

## 2022-10-18 ENCOUNTER — Other Ambulatory Visit (HOSPITAL_COMMUNITY)
Admission: RE | Admit: 2022-10-18 | Discharge: 2022-10-18 | Disposition: A | Discharge status: Home / Self Care | Payer: Medicaid Other | Source: Ambulatory Visit | Attending: Family Medicine | Admitting: Family Medicine

## 2022-10-18 ENCOUNTER — Encounter: Payer: Self-pay | Admitting: Family Medicine

## 2022-10-18 VITALS — BP 115/83 | HR 74 | Wt 201.8 lb

## 2022-10-18 DIAGNOSIS — Z124 Encounter for screening for malignant neoplasm of cervix: Secondary | ICD-10-CM | POA: Insufficient documentation

## 2022-10-18 DIAGNOSIS — R309 Painful micturition, unspecified: Secondary | ICD-10-CM

## 2022-10-18 DIAGNOSIS — N926 Irregular menstruation, unspecified: Secondary | ICD-10-CM

## 2022-10-18 DIAGNOSIS — G8929 Other chronic pain: Secondary | ICD-10-CM

## 2022-10-18 DIAGNOSIS — R102 Pelvic and perineal pain: Secondary | ICD-10-CM

## 2022-10-18 LAB — POCT URINALYSIS DIPSTICK
Bilirubin, UA: NEGATIVE
Blood, UA: NEGATIVE
Glucose, UA: NEGATIVE
Ketones, UA: NEGATIVE
Leukocytes, UA: NEGATIVE
Nitrite, UA: NEGATIVE
Odor: NEGATIVE
Protein, UA: NEGATIVE
Spec Grav, UA: 1.02 (ref 1.010–1.025)
Urobilinogen, UA: 0.2 E.U./dL
pH, UA: 6 (ref 5.0–8.0)

## 2022-10-18 NOTE — Progress Notes (Signed)
Pt is in the office reporting intermittent pelvic pain 5/10 for a year. Pt reports pain is worse with urination. Last 05/11/2019 LMP 09/19/2022 and reports irregular cycles.
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
## 2022-10-20 LAB — URINE CULTURE

## 2022-10-22 LAB — CERVICOVAGINAL ANCILLARY ONLY
Bacterial Vaginitis (gardnerella): NEGATIVE
Candida Glabrata: NEGATIVE
Candida Vaginitis: NEGATIVE
Chlamydia: NEGATIVE
Comment: NEGATIVE
Comment: NEGATIVE
Comment: NEGATIVE
Comment: NEGATIVE
Comment: NEGATIVE
Comment: NORMAL
Neisseria Gonorrhea: NEGATIVE
Trichomonas: NEGATIVE

## 2022-10-24 LAB — CYTOLOGY - PAP
Comment: NEGATIVE
Diagnosis: NEGATIVE
High risk HPV: NEGATIVE

## 2022-10-26 ENCOUNTER — Ambulatory Visit (HOSPITAL_BASED_OUTPATIENT_CLINIC_OR_DEPARTMENT_OTHER)
Admission: RE | Admit: 2022-10-26 | Discharge: 2022-10-26 | Disposition: A | Discharge status: Home / Self Care | Payer: Medicaid Other | Source: Ambulatory Visit | Attending: Family Medicine | Admitting: Family Medicine

## 2022-10-26 DIAGNOSIS — G8929 Other chronic pain: Secondary | ICD-10-CM

## 2022-10-26 DIAGNOSIS — R102 Pelvic and perineal pain: Secondary | ICD-10-CM | POA: Diagnosis not present

## 2022-10-30 IMAGING — CR DG CHEST 2V
2 series · 2 of 2 positions shown · non-contrast
Comparison: Chest radiograph 08/16/2020

CLINICAL DATA: Central chest pain

EXAM:
CHEST - 2 VIEW

[chest pa]
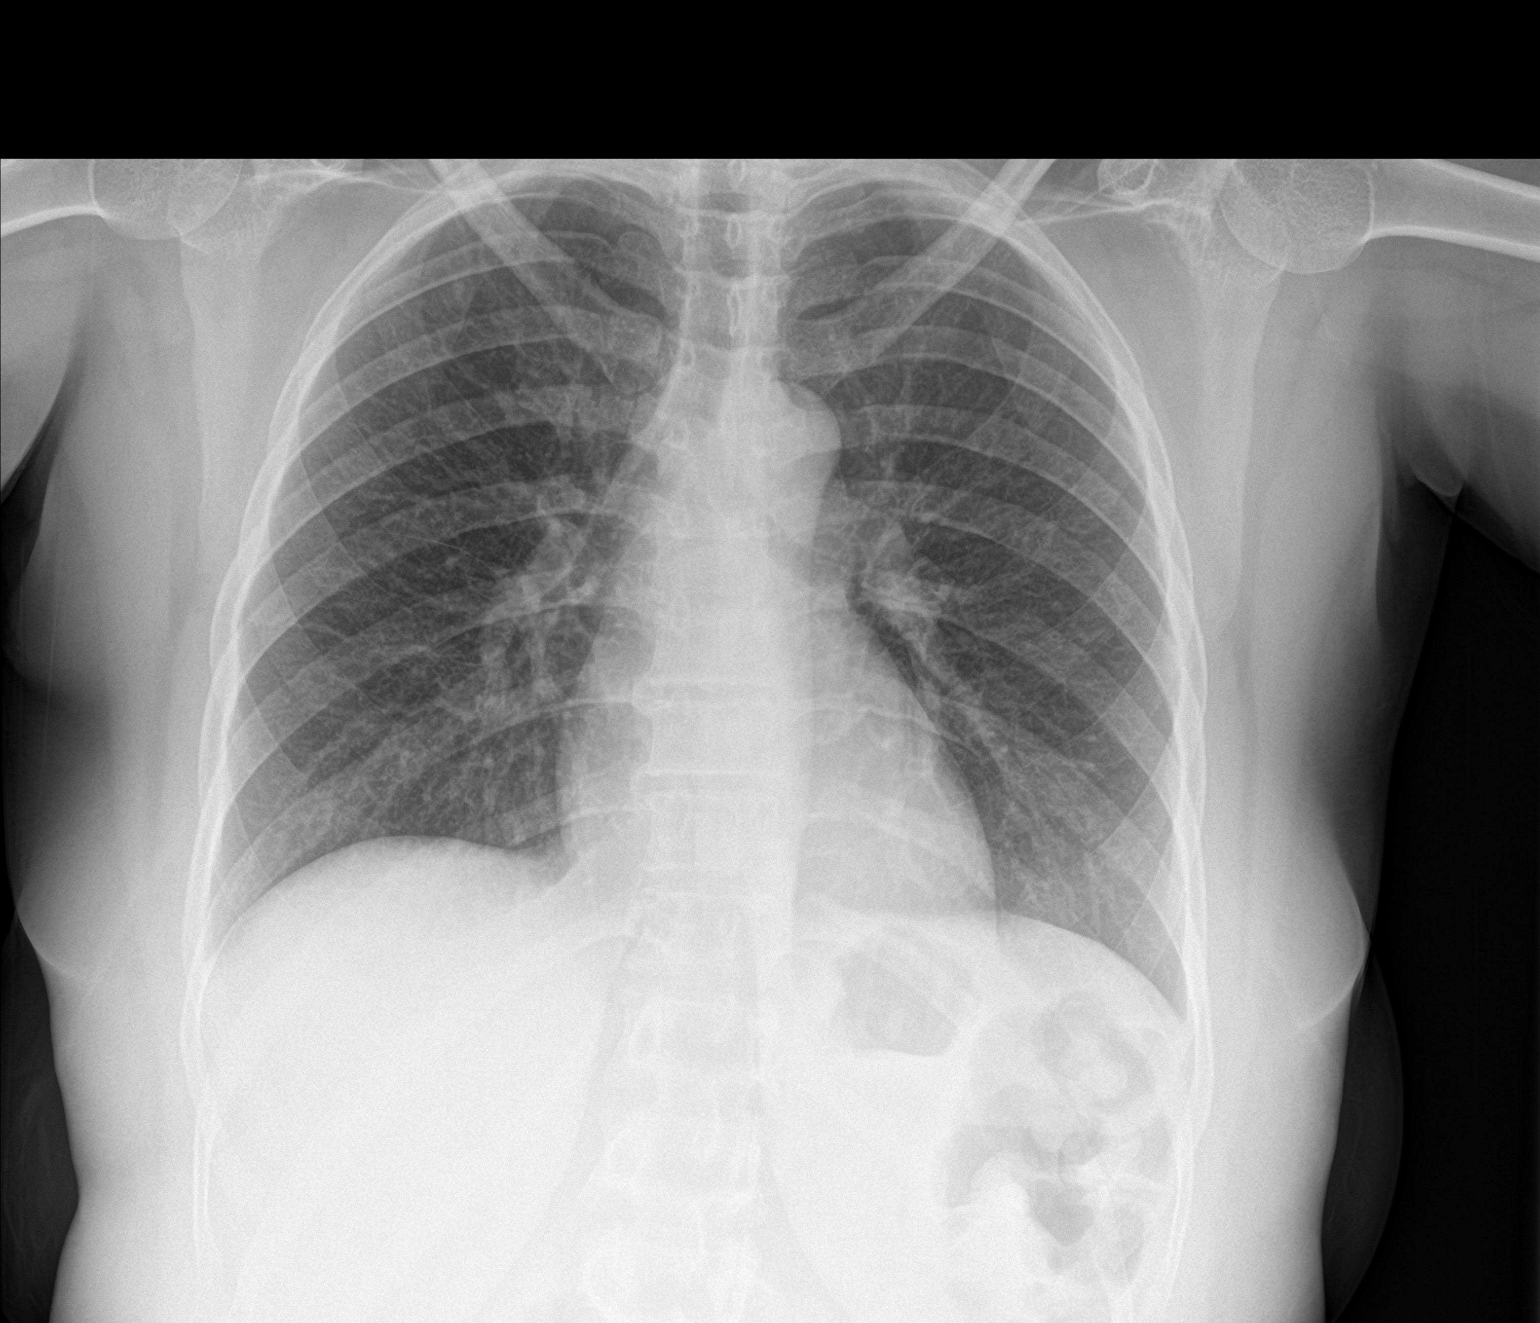

[chest lat]
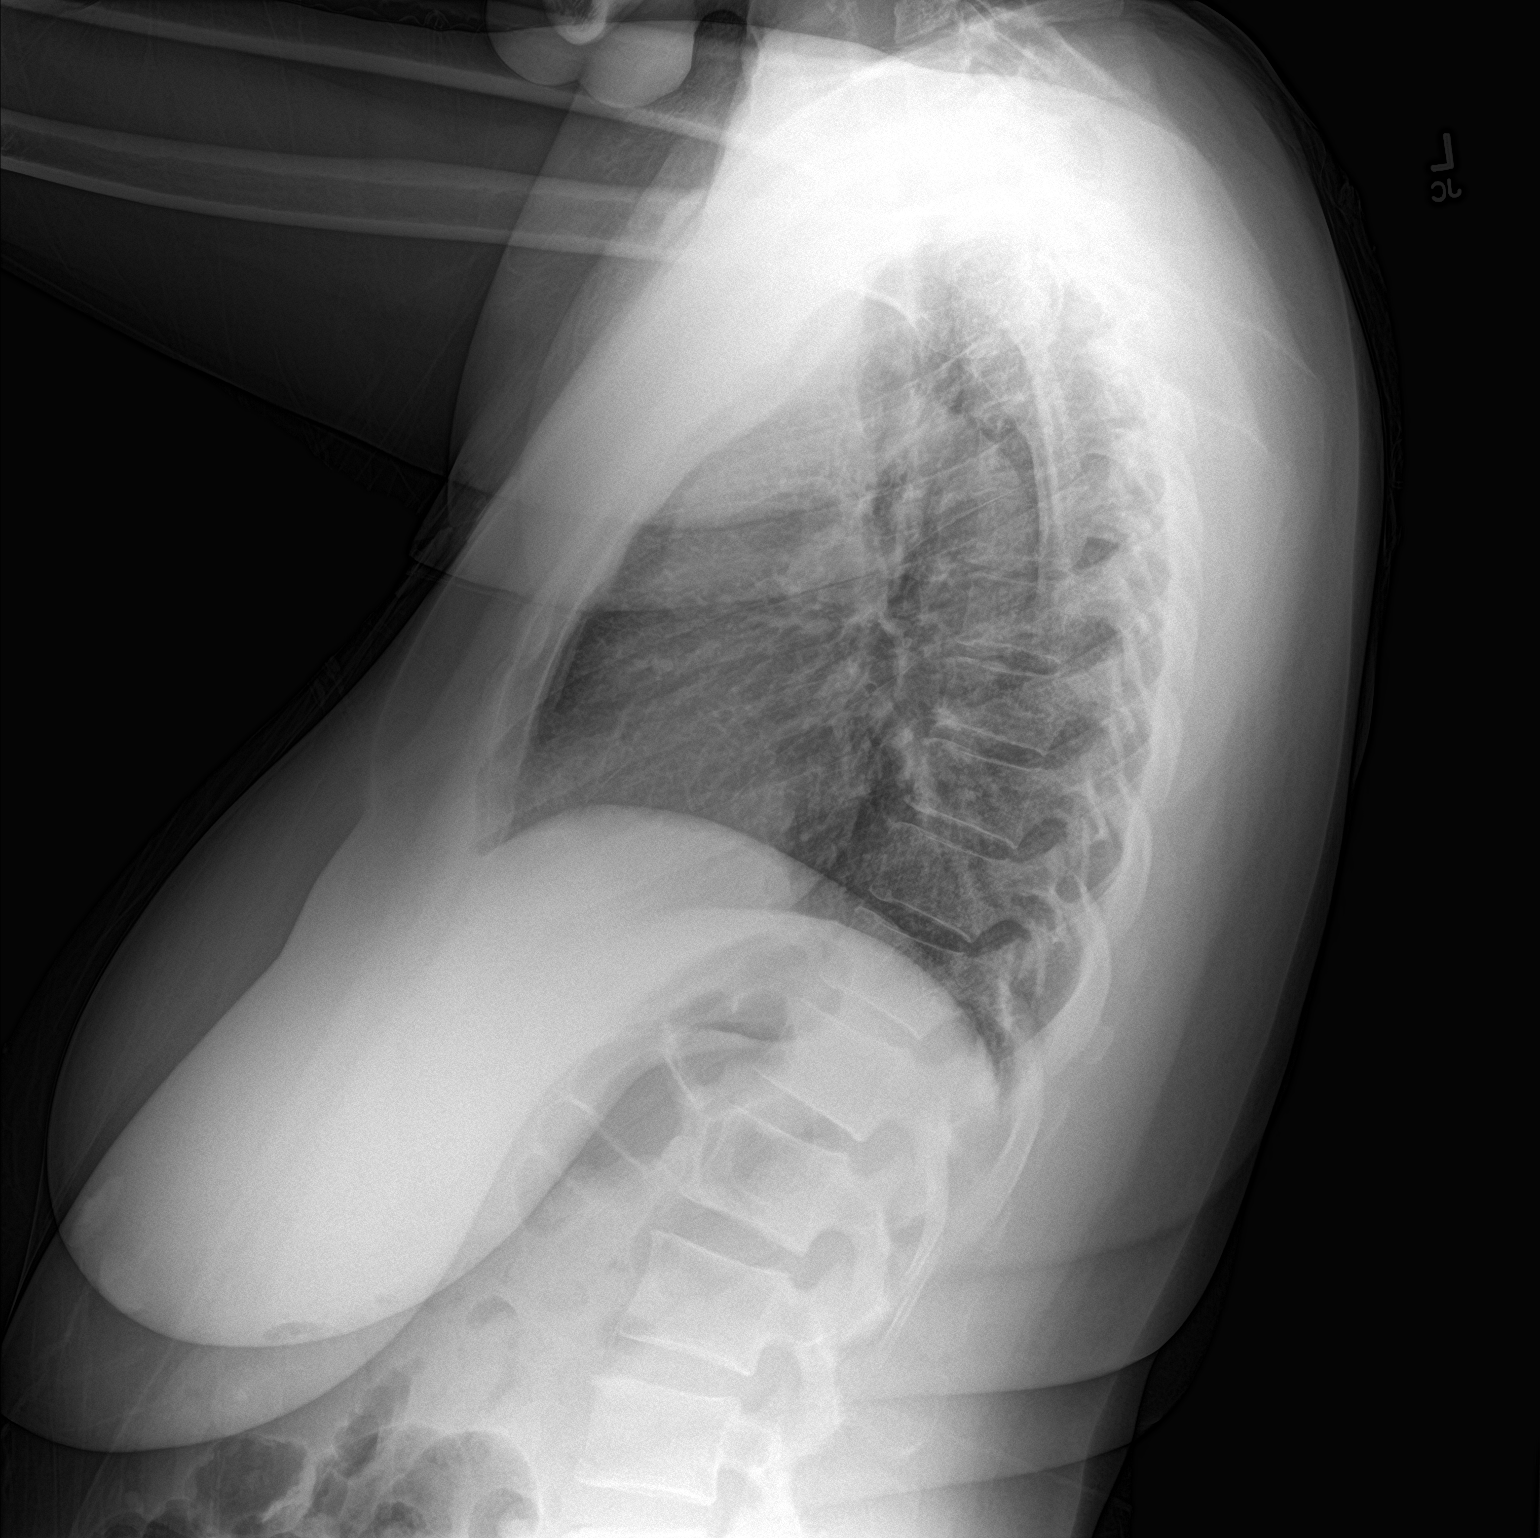

[2 of 2 positions shown; findings below may reference images not displayed]

FINDINGS: The cardiomediastinal contours are within normal limits. The lungs
are clear. No pneumothorax or pleural effusion. No acute finding in
the visualized skeleton.
IMPRESSION: No acute cardiopulmonary finding.

## 2022-10-31 ENCOUNTER — Ambulatory Visit (INDEPENDENT_AMBULATORY_CARE_PROVIDER_SITE_OTHER): Payer: Medicaid Other | Admitting: Allergy

## 2022-10-31 ENCOUNTER — Other Ambulatory Visit: Payer: Self-pay

## 2022-10-31 ENCOUNTER — Encounter: Payer: Self-pay | Admitting: Allergy

## 2022-10-31 VITALS — BP 92/66 | HR 71 | Temp 98.3°F | Resp 16

## 2022-10-31 DIAGNOSIS — J4520 Mild intermittent asthma, uncomplicated: Secondary | ICD-10-CM

## 2022-10-31 DIAGNOSIS — J301 Allergic rhinitis due to pollen: Secondary | ICD-10-CM

## 2022-10-31 DIAGNOSIS — H1013 Acute atopic conjunctivitis, bilateral: Secondary | ICD-10-CM

## 2022-10-31 MED ORDER — DULERA 100-5 MCG/ACT IN AERO: INHALATION_SPRAY | RESPIRATORY_TRACT | 5 refills | Status: DC

## 2022-10-31 MED ORDER — LEVOCETIRIZINE DIHYDROCHLORIDE 5 MG PO TABS: 5.0000 mg | ORAL_TABLET | Freq: Every evening | ORAL | 5 refills | Status: DC

## 2022-10-31 MED ORDER — OLOPATADINE HCL 0.2 % OP SOLN: OPHTHALMIC | 5 refills | Status: DC

## 2022-10-31 MED ORDER — DYMISTA 137-50 MCG/ACT NA SUSP: NASAL | 5 refills | Status: DC

## 2022-10-31 MED ORDER — VENTOLIN HFA 108 (90 BASE) MCG/ACT IN AERS: 2.0000 | INHALATION_SPRAY | Freq: Four times a day (QID) | RESPIRATORY_TRACT | 1 refills | Status: DC | PRN

## 2022-10-31 NOTE — Patient Instructions (Addendum)
-   Continue avoidance measures for weeds and trees. - Can take the following as needed: Xyzal (levocetirizine) '5mg'$  tablet once daily.  This is a long-acting antihistamine the should not cause drowsiness.  Dymista nasal spray two sprays per nostril 1-2 times daily as needed for nasal stuffiness or drainage.  Pataday (olopatadine) one drop per eye  daily as needed for itchy/watery eyes.  - You can use an extra dose of the antihistamine, if needed, for breakthrough symptoms.  - Consider allergy shots as a means of long-term control. - Allergy shots "re-train" and "reset" the immune system to ignore environmental allergens and decrease the resulting immune response to those allergens (sneezing, itchy watery eyes, runny nose, nasal congestion, etc).    - Allergy shots improve symptoms in 80-85% of patients.    - Action plan (if having a flare or respiratory illness) use the following inhaler: Dulera 100/75mg two puffs twice daily with spacer - Rescue medications: albuterol 2 puffs every 4-6 hours as needed for cough/wheeze/shortness of breath/chest tightness.  May use 15-20 minutes prior to activity.   Monitor frequency of use.    - Breathing control goals:  * Full participation in all desired activities (may need albuterol before activity) * Albuterol use two time or less a week on average (not counting use with activity) * Cough interfering with sleep two time or less a month * Oral steroids no more than once a year * No hospitalizations  Follow-up in 6 months or sooner if needed

## 2022-10-31 NOTE — Progress Notes (Signed)
Follow-up Note  RE: Michaela Moran MRN: [id number] DOB: 06-29-1981 Date of Office Visit: 10/31/2022   History of present illness: Michaela Moran is a 42 y.o. female presenting today for follow-up of allergic rhinitis with conjunctivitis  and reactive airway.  She was last seen in the office on 05/30/22 by myself.    She states she used the dulera and it was helpful.  She is no longer using the inhaler however.  She also has access to albuterol inhaler.   She states she has noted when she has fried fish in the home she has had symptoms with coughing.  She also notes with bleach or strong smelling cleaning supplies has caused her to have coughing.  She states is in a new home and she can tell that she and her children seem to be doing better in this new home.  She states the previous place she lived had mold and she would try to clean up with the bleach but now in new home she has not had the cleaning chemical exposures since there is no mold.    With the allergy medications she states that Zyrtec made her sleepy thus does not use this.  She states the dymista works well and she wants a refill of this.  She has not really needed to use the pataday.    Review of systems in the past 4 weeks: Review of Systems  Constitutional: Negative.   HENT: Negative.    Eyes: Negative.   Respiratory: Negative.    Cardiovascular: Negative.   Gastrointestinal: Negative.   Musculoskeletal: Negative.   Skin: Negative.   Allergic/Immunologic: Negative.   Neurological: Negative.      All other systems negative unless noted above in HPI  Past medical/social/surgical/family history have been reviewed and are unchanged unless specifically indicated below.  No changes  Medication List: Current Outpatient Medications  Medication Sig Dispense Refill   DYMISTA 137-50 MCG/ACT SUSP 1-2 times daily as needed for nasal stuffiness or drainage 23 g 5   levocetirizine (XYZAL) 5 MG tablet Take 1 tablet (5 mg total) by  mouth every evening. 30 tablet 5   Olopatadine HCl (PATADAY) 0.2 % SOLN 1 drop per eye daily as needed for itchy/watery eyes. 2.5 mL 5   pantoprazole (PROTONIX) 40 MG tablet Take 1 tablet (40 mg total) by mouth daily. FOR ACID 30 tablet 3   Dapsone (ACZONE) 5 % topical gel Apply topically 2 times per day (Patient not taking: Reported on 10/31/2022) 60 g 1   mometasone-formoterol (DULERA) 100-5 MCG/ACT AERO 2 puffs 2 times daily as needed for respiratory illness or flare. 13 g 5   senna-docusate (SENOKOT-S) 8.6-50 MG tablet Take 2 tablets by mouth daily. CONSTIPATION (Patient not taking: Reported on 10/31/2022) 60 tablet 5   tretinoin (RETIN-A) 0.025 % cream Apply topically at bedtime. (Patient not taking: Reported on 10/31/2022) 45 g 1   VENTOLIN HFA 108 (90 Base) MCG/ACT inhaler Inhale 2 puffs into the lungs every 6 (six) hours as needed for wheezing or shortness of breath. 18 g 1   No current facility-administered medications for this visit.     Known medication allergies: Allergies  Allergen Reactions   Other Swelling    Allergic to peas.     Physical examination: Blood pressure 92/66, pulse 71, temperature 98.3 F (36.8 C), temperature source Temporal, resp. rate 16, SpO2 97 %.  General: Alert, interactive, in no acute distress. HEENT: PERRLA, TMs pearly gray, turbinates non-edematous without  discharge, post-pharynx non erythematous. Neck: Supple without lymphadenopathy. Lungs: Clear to auscultation without wheezing, rhonchi or rales. {no increased work of breathing. CV: Normal S1, S2 without murmurs. Abdomen: Nondistended, nontender. Skin: Warm and dry, without lesions or rashes. Extremities:  No clubbing, cyanosis or edema. Neuro:   Grossly intact.  Diagnositics/Labs: None today  Assessment and plan: Allergic rhinitis with conjunctivitis - Continue avoidance measures for weeds and trees. - Can take the following as needed: Xyzal (levocetirizine) 92m tablet once daily.  This  is a long-acting antihistamine the should not cause drowsiness.  Dymista nasal spray two sprays per nostril 1-2 times daily as needed for nasal stuffiness or drainage.  Pataday (olopatadine) one drop per eye  daily as needed for itchy/watery eyes.  - You can use an extra dose of the antihistamine, if needed, for breakthrough symptoms.  - Consider allergy shots as a means of long-term control. - Allergy shots "re-train" and "reset" the immune system to ignore environmental allergens and decrease the resulting immune response to those allergens (sneezing, itchy watery eyes, runny nose, nasal congestion, etc).    - Allergy shots improve symptoms in 80-85% of patients.   Reactive airway - Action plan (if having a flare or respiratory illness) use the following inhaler: Dulera 100/556m two puffs twice daily with spacer - Rescue medications: albuterol 2 puffs every 4-6 hours as needed for cough/wheeze/shortness of breath/chest tightness.  May use 15-20 minutes prior to activity.   Monitor frequency of use.    - Breathing control goals:  * Full participation in all desired activities (may need albuterol before activity) * Albuterol use two time or less a week on average (not counting use with activity) * Cough interfering with sleep two time or less a month * Oral steroids no more than once a year * No hospitalizations  Follow-up in 6 months or sooner if needed  I appreciate the opportunity to take part in Michaela Moran's care. Please do not hesitate to contact me with questions.  Sincerely,   ShPrudy FeelerMD Allergy/Immunology Allergy and [facility] [place]

## 2022-11-01 ENCOUNTER — Encounter: Payer: Self-pay | Admitting: Physician Assistant

## 2022-11-01 ENCOUNTER — Ambulatory Visit: Payer: Medicaid Other | Attending: Physician Assistant | Admitting: Physician Assistant

## 2022-11-01 VITALS — BP 111/80 | HR 68 | Wt 196.6 lb

## 2022-11-01 DIAGNOSIS — N736 Female pelvic peritoneal adhesions (postinfective): Secondary | ICD-10-CM | POA: Diagnosis not present

## 2022-11-01 DIAGNOSIS — K219 Gastro-esophageal reflux disease without esophagitis: Secondary | ICD-10-CM | POA: Diagnosis not present

## 2022-11-01 DIAGNOSIS — H539 Unspecified visual disturbance: Secondary | ICD-10-CM | POA: Diagnosis not present

## 2022-11-01 DIAGNOSIS — R7303 Prediabetes: Secondary | ICD-10-CM

## 2022-11-01 NOTE — Patient Instructions (Signed)

## 2022-11-01 NOTE — Progress Notes (Signed)
Patient ID: Michaela Moran, female   DOB: 06-17-1981, 42 y.o.   MRN: [id number]   Michaela Moran, is a 42 y.o. female  [id number]  [id number]  DOB - Feb 12, 1981  Chief Complaint  Patient presents with   Fatigue       Subjective:   Michaela Moran is a 42 y.o. female here today for a a check up on acid reflux.  She is doing much better since Zelda started her on pantoprazole.  She is also wanted to review all her labs done in 2024.  She wants to discuss prediabetes and proper diet.   She has the sensation her bladder is over full all the time.  Urine and vaginal swabs have been negative.  Recent pelvic ultrasound.    Wants referral to eye doctor due to non-acute vision changes.  Had Lasix on R eye and wants to see about getting it on L eye.       No problems updated.  ALLERGIES: Allergies  Allergen Reactions   Other Swelling    Allergic to peas.    PAST MEDICAL HISTORY: Past Medical History:  Diagnosis Date   Acid reflux    Female circumcision    Hemorrhoid    History of positive PPD    neg CXR   Hypercholesteremia    Previous cesarean delivery, antepartum condition or complication [id number]   History of C/S x 2     S/P repeat low transverse C-section 06/27/2016    MEDICATIONS AT HOME: Prior to Admission medications   Medication Sig Start Date End Date Taking? Authorizing Provider  Dapsone (ACZONE) 5 % topical gel Apply topically 2 times per day 09/20/21  Yes Gildardo Pounds, NP  DYMISTA 137-50 MCG/ACT SUSP 1-2 times daily as needed for nasal stuffiness or drainage 10/31/22  Yes Padgett, Rae Halsted, MD  levocetirizine (XYZAL) 5 MG tablet Take 1 tablet (5 mg total) by mouth every evening. 10/31/22  Yes Padgett, Rae Halsted, MD  mometasone-formoterol [facility]) 100-5 MCG/ACT AERO 2 puffs 2 times daily as needed for respiratory illness or flare. 10/31/22  Yes Padgett, Rae Halsted, MD  Olopatadine HCl (PATADAY) 0.2 % SOLN 1 drop per eye daily as needed for  itchy/watery eyes. 10/31/22  Yes Padgett, Rae Halsted, MD  pantoprazole (PROTONIX) 40 MG tablet Take 1 tablet (40 mg total) by mouth daily. FOR ACID 10/02/22  Yes Gildardo Pounds, NP  senna-docusate (SENOKOT-S) 8.6-50 MG tablet Take 2 tablets by mouth daily. CONSTIPATION 10/02/22  Yes Gildardo Pounds, NP  tretinoin (RETIN-A) 0.025 % cream Apply topically at bedtime. 11/13/21  Yes Gildardo Pounds, NP  VENTOLIN HFA 108 (90 Base) MCG/ACT inhaler Inhale 2 puffs into the lungs every 6 (six) hours as needed for wheezing or shortness of breath. 10/31/22  Yes Padgett, Rae Halsted, MD    ROS: Neg HEENT Neg resp Neg cardiac Neg GI Neg GU Neg MS Neg psych Neg neuro  Objective:   Vitals:   11/01/22 0904  BP: 111/80  Pulse: 68  SpO2: 96%  Weight: 196 lb 9.6 oz (89.2 kg)   Exam General appearance : Awake, alert, not in any distress. Speech Clear. Not toxic looking HEENT: Atraumatic and Normocephalic, pupils equally reactive to light and accomodation, EOM intact Neck: Supple, no JVD. No cervical lymphadenopathy.  Chest: Good air entry bilaterally, CTAB.  No rales/rhonchi/wheezing CVS: S1 S2 regular, no murmurs.  Extremities: B/L Lower Ext shows no edema, both legs are warm to touch Neurology: Awake alert, and oriented X  3, CN II-XII intact, Non focal Skin: No Rash  Data Review Lab Results  Component Value Date   HGBA1C 5.8 (H) 10/02/2022   HGBA1C 5.6 01/15/2022   HGBA1C 5.5 09/20/2021    Assessment & Plan   1. Prediabetes I have had a lengthy discussion and provided education about insulin resistance and the intake of too much sugar/refined carbohydrates.  I have advised the patient to work at a goal of eliminating sugary drinks, candy, desserts, sweets, refined sugars, processed foods, and white carbohydrates.  The patient expresses understanding.    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 GERD without esophagitis Much improved-continue  pantoprazole  4. Vision changes - Ambulatory referral to Ophthalmology    Return if symptoms worsen or fail to improve.  The patient was given clear instructions to go to ER or return to medical center if symptoms don't improve, worsen or new problems develop. The patient verbalized understanding. The patient was told to call to get lab results if they haven't heard anything in the next week.      Freeman Caldron, PA-C [facility] [place], [place]   11/01/2022, 9:27 AM

## 2022-11-14 ENCOUNTER — Encounter: Payer: Self-pay | Admitting: Urology

## 2022-11-14 ENCOUNTER — Ambulatory Visit (INDEPENDENT_AMBULATORY_CARE_PROVIDER_SITE_OTHER): Payer: Medicaid Other | Admitting: Urology

## 2022-11-14 VITALS — BP 104/73 | HR 76 | Ht 67.0 in | Wt 195.0 lb

## 2022-11-14 DIAGNOSIS — R300 Dysuria: Secondary | ICD-10-CM | POA: Diagnosis not present

## 2022-11-14 DIAGNOSIS — R3989 Other symptoms and signs involving the genitourinary system: Secondary | ICD-10-CM | POA: Diagnosis not present

## 2022-11-14 LAB — URINALYSIS
Bilirubin, UA: NEGATIVE
Glucose, UA: NEGATIVE mg/dL
Ketones, POC UA: NEGATIVE mg/dL
Leukocytes, UA: NEGATIVE
Nitrite, UA: NEGATIVE
Protein Ur, POC: NEGATIVE mg/dL
Spec Grav, UA: >=1.03 — AB (ref 1.010–1.025)
Urobilinogen, UA: 0.2 E.U./dL
pH, UA: 6 (ref 5.0–8.0)

## 2022-11-14 LAB — BLADDER SCAN AMB NON-IMAGING

## 2022-11-14 NOTE — Progress Notes (Signed)
Assessment: 1. Sensation of pressure in bladder area      Plan: Today I had a long discussion with the patient and her husband regarding her bladder pressure discomfort which is only intermittent.  As noted she has had a fairly significant workup including urinalyses pelvic ultrasound and residual urine determination which all were normal.  She is very concerned that her prior bladder injury at the time of C-section may be contributing to her current issues.  I did discuss with her the option of cystoscopy and she would like to pursue that. Schedule female exam and cysto  Chief Complaint: bladder pressure  History of Present Illness:  Michaela Moran is a 42 y.o. female who is seen in consultation from Gildardo Pounds, NP for evaluation of bladder pressure.  Patient reports that a few days per week generally while she is at work she has a uncomfortable feeling when her bladder is full.  She is able to go to the restroom as needed does not have to hold her bladder for prolonged periods.  She states that she has had discomfort in the pelvic area for approximately the past year.  She is recently seen her gynecologist and has undergone evaluation including pelvic ultrasound, urinalysis, STD testing, all of which have been negative.  She does have some irregular menstrual bleeding but denies any hematuria.  Of note, patient has had 3 C-sections the last 1 in 2017.  In 2014 at the time of the C-section she did have a cystotomy which was repaired.  The patient is very concerned that this may have something to do with her current symptoms.  PVR today equals 9 mL.    Past Medical History:  Past Medical History:  Diagnosis Date   Acid reflux    Female circumcision    Hemorrhoid    History of positive PPD    neg CXR   Hypercholesteremia    Previous cesarean delivery, antepartum condition or complication [id number]   History of C/S x 2     S/P repeat low transverse C-section 06/27/2016    Past  Surgical History:  Past Surgical History:  Procedure Laterality Date   ARTERY BIOPSY Right 02/17/2021   Procedure: RIGHT TEMPORAL ARTERY BIOPSY;  Surgeon: Angelia Mould, MD;  Location: [place];  Service: Vascular;  Laterality: Right;   CESAREAN SECTION     CESAREAN SECTION N/A 01/02/2013   Procedure: CESAREAN SECTION;  Surgeon: Donnamae Jude, MD;  Location: [facility];  Service: Obstetrics;  Laterality: N/A;  Incidental Cyystotomy   CESAREAN SECTION N/A 06/26/2016   Procedure: CESAREAN SECTION;  Surgeon: Aletha Halim, MD;  Location: [place];  Service: Obstetrics;  Laterality: N/A;   CYSTOTOMY     with repair at 2014 repeat c-section    Allergies:  Allergies  Allergen Reactions   Other Swelling    Allergic to peas.    Family History:  Family History  Problem Relation Age of Onset   Diabetes Mother    Diabetes Sister    Diabetes Brother    Hypertension Brother    Diabetes Sister    Diabetes Sister    Autism Son    Healthy Son    Healthy Daughter    Other Neg Hx     Social History:  Social History   Tobacco Use   Smoking status: Never   Smokeless tobacco: Never  Vaping Use   Vaping Use: Never used  Substance Use Topics   Alcohol use: No  Drug use: No    Review of symptoms:  Constitutional:  Negative for unexplained weight loss, night sweats, fever, chills ENT:  Negative for nose bleeds, sinus pain, painful swallowing CV:  Negative for chest pain, shortness of breath, exercise intolerance, palpitations, loss of consciousness Resp:  Negative for cough, wheezing, shortness of breath GI:  Negative for nausea, vomiting, diarrhea, bloody stools GU:  Positives noted in HPI; otherwise negative for gross hematuria, dysuria, urinary incontinence Neuro:  Negative for seizures, poor balance, limb weakness, slurred speech Psych:  Negative for lack of energy, depression, anxiety Endocrine:  Negative for polydipsia, polyuria, symptoms of hypoglycemia (dizziness,  hunger, sweating) Hematologic:  Negative for anemia, purpura, petechia, prolonged or excessive bleeding, use of anticoagulants  Allergic:  Negative for difficulty breathing or choking as a result of exposure to anything; no shellfish allergy; no allergic response (rash/itch) to materials, foods  Physical exam: BP 104/73   Pulse 76   Ht '5\' 7"'$  (1.702 m)   Wt 195 lb (88.5 kg)   BMI 30.54 kg/m  GENERAL APPEARANCE:  Well appearing, well developed, well nourished, NAD   Results: Results for orders placed or performed in visit on 11/14/22 (from the past 24 hour(s))  BLADDER SCAN AMB NON-IMAGING   Collection Time: 11/14/22 12:00 AM  Result Value Ref Range   Scan Result 45m

## 2022-11-20 ENCOUNTER — Encounter: Payer: Self-pay | Admitting: Family Medicine

## 2022-11-20 ENCOUNTER — Ambulatory Visit (INDEPENDENT_AMBULATORY_CARE_PROVIDER_SITE_OTHER): Payer: Medicaid Other | Admitting: Family Medicine

## 2022-11-20 VITALS — BP 110/80 | HR 77 | Ht 67.0 in | Wt 195.6 lb

## 2022-11-20 DIAGNOSIS — G8929 Other chronic pain: Secondary | ICD-10-CM

## 2022-11-20 DIAGNOSIS — R102 Pelvic and perineal pain: Secondary | ICD-10-CM | POA: Diagnosis not present

## 2022-11-20 DIAGNOSIS — N83292 Other ovarian cyst, left side: Secondary | ICD-10-CM

## 2022-11-20 NOTE — Progress Notes (Signed)
Pt presents for [place] f/u. Pt reports no changes in pelvic pain. Pt has concerns at blood sugar.

## 2022-11-20 NOTE — Progress Notes (Signed)
GYNECOLOGY OFFICE VISIT NOTE  History:   Michaela Moran is a 42 y.o. G3P3003 here today to follow up on pelvic pain and discuss [place] results. She was last seen on 2/1 with concerns about pelvic pain. Still continues to feel intermittent pelvic pain, mostly on the left side. No new changes otherwise. Reviewed [place], shows a small 1.8cm complex ovarian cyst on the left side. She denies any abnormal vaginal discharge.   Past Medical History:  Diagnosis Date   Acid reflux    Female circumcision    Hemorrhoid    History of positive PPD    neg CXR   Hypercholesteremia    Previous cesarean delivery, antepartum condition or complication A999333   History of C/S x 2     S/P repeat low transverse C-section 06/27/2016    Past Surgical History:  Procedure Laterality Date   ARTERY BIOPSY Right 02/17/2021   Procedure: RIGHT TEMPORAL ARTERY BIOPSY;  Surgeon: Angelia Mould, MD;  Location: [place];  Service: Vascular;  Laterality: Right;   [place] N/A 01/02/2013   Procedure: CESAREAN SECTION;  Surgeon: Donnamae Jude, MD;  Location: [facility];  Service: Obstetrics;  Laterality: N/A;  Incidental Cyystotomy   CESAREAN SECTION N/A 06/26/2016   Procedure: CESAREAN SECTION;  Surgeon: Aletha Halim, MD;  Location: [place];  Service: Obstetrics;  Laterality: N/A;   CYSTOTOMY     with repair at 2014 repeat c-section    The following portions of the patient's history were reviewed and updated as appropriate: allergies, current medications, past family history, past medical history, past social history, past surgical history and problem list.   Health Maintenance:  Normal pap and negative HRHPV on 10/18/2022.  Normal mammogram on 11/21/2022.   Review of Systems:  Pertinent items noted in HPI and remainder of comprehensive ROS otherwise negative.  Physical Exam:  BP 110/80   Pulse 77   Ht '5\' 7"'$  (1.702 m)   Wt 195 lb 9.6 oz (88.7 kg)   BMI 30.64 kg/m    CONSTITUTIONAL: Well-developed, well-nourished female in no acute distress.  NECK: Normal range of motion, supple, no masses noted on observation SKIN: No rash noted. Not diaphoretic. No erythema. No pallor. PSYCHIATRIC: Normal mood and affect. Normal behavior. Normal judgment and thought content. CARDIOVASCULAR: Normal heart rate noted RESPIRATORY: Effort and breath sounds normal, no problems with respiration noted ABDOMEN: No masses noted. +tenderness to palpation over LLQ, no rebound tenderness or guarding.   Labs and Imaging No results found for this or any previous visit (from the past 168 hour(s)).  MM 3D SCREEN BREAST BILATERAL  Result Date: 11/23/2022 CLINICAL DATA:  Screening. EXAM: DIGITAL SCREENING BILATERAL MAMMOGRAM WITH TOMOSYNTHESIS AND CAD TECHNIQUE: Bilateral screening digital craniocaudal and mediolateral oblique mammograms were obtained. Bilateral screening digital breast tomosynthesis was performed. The images were evaluated with computer-aided detection. COMPARISON:  Previous exam(s). ACR Breast Density Category b: There are scattered areas of fibroglandular density. FINDINGS: There are no findings suspicious for malignancy. IMPRESSION: No mammographic evidence of malignancy. A result letter of this screening mammogram will be mailed directly to the patient. RECOMMENDATION: Screening mammogram in one year. (Code:SM-B-01Y) BI-RADS CATEGORY  1: Negative. Electronically Signed   By: Abelardo Diesel M.D.   On: 11/23/2022 14:09       Assessment and Plan:  Chronic pelvic pain in female   Etiology still unclear, possibly related to ovarian cyst on the left side, however given how small this  is, I do not think this is likely. This may also reflect pelvic adhesive dx, resulting in chronic pain. Other possible differentials include hip joint arthritis, which can present as anterior hip pain. Will have patient follow up with ObGyn here in office for further evaluation.  Return in  about 4 weeks (around 12/18/2022).    I spent 25 minutes dedicated to the care of this patient including pre-visit review of records, face to face time with the patient discussing her conditions and treatments and post visit orders.   Liliane Channel MD MPH OB Fellow, [place] for [place] 11/26/2022

## 2022-11-21 ENCOUNTER — Ambulatory Visit
Admission: RE | Admit: 2022-11-21 | Discharge: 2022-11-21 | Disposition: A | Discharge status: Home / Self Care | Payer: Medicaid Other | Source: Ambulatory Visit | Attending: Nurse Practitioner | Admitting: Nurse Practitioner

## 2022-11-21 DIAGNOSIS — Z1231 Encounter for screening mammogram for malignant neoplasm of breast: Secondary | ICD-10-CM

## 2022-11-26 DIAGNOSIS — N83292 Other ovarian cyst, left side: Secondary | ICD-10-CM

## 2022-11-26 DIAGNOSIS — G8929 Other chronic pain: Secondary | ICD-10-CM | POA: Insufficient documentation

## 2022-11-26 HISTORY — DX: Other chronic pain: G89.29

## 2022-11-26 HISTORY — DX: Other ovarian cyst, left side: N83.292

## 2022-11-28 ENCOUNTER — Other Ambulatory Visit: Payer: Medicaid Other | Admitting: Urology

## 2022-12-04 ENCOUNTER — Other Ambulatory Visit: Payer: Medicaid Other | Admitting: Urology

## 2022-12-05 ENCOUNTER — Ambulatory Visit: Payer: Medicaid Other

## 2022-12-20 ENCOUNTER — Ambulatory Visit: Payer: Medicaid Other | Admitting: Obstetrics & Gynecology

## 2023-01-08 ENCOUNTER — Encounter: Payer: Self-pay | Admitting: Urology

## 2023-01-08 ENCOUNTER — Other Ambulatory Visit: Payer: Medicaid Other | Admitting: Urology

## 2023-01-21 ENCOUNTER — Ambulatory Visit: Payer: Medicaid Other | Admitting: Obstetrics and Gynecology

## 2023-04-18 ENCOUNTER — Other Ambulatory Visit: Payer: Self-pay

## 2023-04-18 ENCOUNTER — Emergency Department (HOSPITAL_BASED_OUTPATIENT_CLINIC_OR_DEPARTMENT_OTHER)
Admission: EM | Admit: 2023-04-18 | Discharge: 2023-04-18 | Disposition: A | Discharge status: Home / Self Care | Payer: Self-pay | Attending: Emergency Medicine | Admitting: Emergency Medicine

## 2023-04-18 DIAGNOSIS — U071 COVID-19: Secondary | ICD-10-CM

## 2023-04-18 LAB — SARS CORONAVIRUS 2 BY RT PCR: SARS Coronavirus 2 by RT PCR: POSITIVE — AB

## 2023-04-18 NOTE — Discharge Instructions (Addendum)
Your workup today showed you have a COVID infection.  Given that you are otherwise healthy we will defer Paxlovid.  You are in agreement.  Take Tylenol and ibuprofen as you need to for muscle aches.  Drink plenty of fluids.  Return for any concerning symptoms.

## 2023-04-18 NOTE — ED Triage Notes (Signed)
Patient presents to ED via POV from home. Here stating "I think I have allergies". Reports nasal congestion, headache and generalized body aches.

## 2023-04-18 NOTE — ED Provider Notes (Signed)
[name] Provider Note   CSN: [id number] Arrival date & time: 04/18/23  1257     History  Chief Complaint  Patient presents with   Generalized Body Aches    Michaela Moran is a 42 y.o. female.  42 year old female presents today for concern of bodyaches going on for 3 days.  She states her coworkers have been sick.  Denies any cough, sore throat, fever.  Without shortness of breath or chest pain.  No other health problems.  Does not take any medications daily.  The history is provided by the patient. No language interpreter was used.       Home Medications Prior to Admission medications   Medication Sig Start Date End Date Taking? Authorizing Provider  Dapsone (ACZONE) 5 % topical gel Apply topically 2 times per day 09/20/21   Claiborne Rigg, NP  DYMISTA 137-50 MCG/ACT SUSP 1-2 times daily as needed for nasal stuffiness or drainage 10/31/22   Marcelyn Bruins, MD  levocetirizine (XYZAL) 5 MG tablet Take 1 tablet (5 mg total) by mouth every evening. 10/31/22   Marcelyn Bruins, MD  mometasone-formoterol (DULERA) 100-5 MCG/ACT AERO 2 puffs 2 times daily as needed for respiratory illness or flare. 10/31/22   Marcelyn Bruins, MD  Olopatadine HCl (PATADAY) 0.2 % SOLN 1 drop per eye daily as needed for itchy/watery eyes. 10/31/22   Marcelyn Bruins, MD  pantoprazole (PROTONIX) 40 MG tablet Take 1 tablet (40 mg total) by mouth daily. FOR ACID 10/02/22   Claiborne Rigg, NP  senna-docusate (SENOKOT-S) 8.6-50 MG tablet Take 2 tablets by mouth daily. CONSTIPATION 10/02/22   Claiborne Rigg, NP  tretinoin (RETIN-A) 0.025 % cream Apply topically at bedtime. 11/13/21   Claiborne Rigg, NP  VENTOLIN HFA 108 (90 Base) MCG/ACT inhaler Inhale 2 puffs into the lungs every 6 (six) hours as needed for wheezing or shortness of breath. 10/31/22   Marcelyn Bruins, MD      Allergies    Other    Review of Systems    Review of Systems  Constitutional:  Negative for activity change, chills and fever.  HENT:  Negative for congestion, sinus pressure, sore throat, trouble swallowing and voice change.   Eyes:  Negative for visual disturbance.  Respiratory:  Negative for cough and shortness of breath.   Cardiovascular:  Negative for chest pain.  Musculoskeletal:  Positive for myalgias.  Neurological:  Negative for weakness and light-headedness.  All other systems reviewed and are negative.   Physical Exam Updated Vital Signs BP 120/86 (BP Location: Right Arm)   Pulse 91   Temp 99 F (37.2 C) (Oral)   Resp 18   SpO2 99%  Physical Exam Vitals and nursing note reviewed.  Constitutional:      General: She is not in acute distress.    Appearance: Normal appearance. She is not ill-appearing.  HENT:     Head: Normocephalic and atraumatic.     Nose: Nose normal.  Eyes:     General: No scleral icterus.    Extraocular Movements: Extraocular movements intact.     Conjunctiva/sclera: Conjunctivae normal.  Cardiovascular:     Rate and Rhythm: Normal rate and regular rhythm.     Pulses: Normal pulses.     Heart sounds: Normal heart sounds.  Pulmonary:     Effort: Pulmonary effort is normal. No respiratory distress.     Breath sounds: Normal breath sounds. No wheezing or  rales.  Abdominal:     General: There is no distension.     Tenderness: There is no abdominal tenderness.  Musculoskeletal:        General: Normal range of motion.     Cervical back: Normal range of motion.  Skin:    General: Skin is warm and dry.  Neurological:     General: No focal deficit present.     Mental Status: She is alert. Mental status is at baseline.     ED Results / Procedures / Treatments   Labs (all labs ordered are listed, but only abnormal results are displayed) Labs Reviewed  SARS CORONAVIRUS 2 BY RT PCR - Abnormal; Notable for the following components:      Result Value   SARS Coronavirus 2 by RT PCR  POSITIVE (*)    All other components within normal limits    EKG None  Radiology No results found.  Procedures Procedures    Medications Ordered in ED Medications - No data to display  ED Course/ Medical Decision Making/ A&P                                 Medical Decision Making  42 year old female presents today for concern of myalgias.  COVID test obtained and is positive.  Well-appearing.  Symptomatic management discussed.  Work note given.  Quarantine information discussed.  Return precautions discussed.  Discussed drinking plenty of fluids.  Patient voices understanding and is in agreement with plan.   Final Clinical Impression(s) / ED Diagnoses Final diagnoses:  COVID-19    Rx / DC Orders ED Discharge Orders     None         Marita Kansas, PA-C 04/18/23 1456    Laurence Spates, MD 04/19/23 1739

## 2023-04-24 ENCOUNTER — Ambulatory Visit: Payer: Self-pay | Attending: Physician Assistant | Admitting: Physician Assistant

## 2023-04-24 ENCOUNTER — Encounter: Payer: Self-pay | Admitting: Physician Assistant

## 2023-04-24 VITALS — BP 118/84 | HR 91 | Wt 195.4 lb

## 2023-04-24 DIAGNOSIS — U071 COVID-19: Secondary | ICD-10-CM

## 2023-04-24 DIAGNOSIS — Z09 Encounter for follow-up examination after completed treatment for conditions other than malignant neoplasm: Secondary | ICD-10-CM

## 2023-04-24 DIAGNOSIS — M62838 Other muscle spasm: Secondary | ICD-10-CM

## 2023-04-24 DIAGNOSIS — S0081XA Abrasion of other part of head, initial encounter: Secondary | ICD-10-CM

## 2023-04-24 DIAGNOSIS — Z8616 Personal history of COVID-19: Secondary | ICD-10-CM

## 2023-04-24 MED ORDER — MUPIROCIN 2 % EX OINT: 1.0000 | TOPICAL_OINTMENT | Freq: Two times a day (BID) | CUTANEOUS | 0 refills | Status: DC

## 2023-04-24 MED ORDER — CYCLOBENZAPRINE HCL 5 MG PO TABS: 5.0000 mg | ORAL_TABLET | Freq: Three times a day (TID) | ORAL | 3 refills | Status: DC | PRN

## 2023-04-24 NOTE — Progress Notes (Signed)
Michaela Moran, is a 42 y.o. female  [id number]  [id number]  DOB - [birth year]  Chief Complaint  Patient presents with   Headache   Shoulder Pain       Subjective:   Michaela Moran is a 42 y.o. female here today for a follow up visit after being diagnosed with Covid on 04/18/2023.  No one else in the household is sick.  Fevers and symptoms mostly resolved.  Some HA.  She is requesting a RF of muscle relaxers she had last year for various muscle aches and pains.    Also, has scratches on her face from where her autistic son scratched her.  Requesting ointment to avoid infection   No problems updated.  ALLERGIES: Allergies  Allergen Reactions   Other Swelling    Allergic to peas.    PAST MEDICAL HISTORY: Past Medical History:  Diagnosis Date   Acid reflux    Female circumcision    Hemorrhoid    History of positive PPD    neg CXR   Hypercholesteremia    Previous cesarean delivery, antepartum condition or complication 04/28/2016   History of C/S x 2     S/P repeat low transverse C-section 06/27/2016    MEDICATIONS AT HOME: Prior to Admission medications   Medication Sig Start Date End Date Taking? Authorizing Provider  cyclobenzaprine (FLEXERIL) 5 MG tablet Take 1 tablet (5 mg total) by mouth 3 (three) times daily as needed for muscle spasms. 04/24/23  Yes Georgian Co M, PA-C  Dapsone (ACZONE) 5 % topical gel Apply topically 2 times per day 09/20/21  Yes Claiborne Rigg, NP  DYMISTA 137-50 MCG/ACT SUSP 1-2 times daily as needed for nasal stuffiness or drainage 10/31/22  Yes Padgett, Pilar Grammes, MD  levocetirizine (XYZAL) 5 MG tablet Take 1 tablet (5 mg total) by mouth every evening. 10/31/22  Yes Padgett, Pilar Grammes, MD  mometasone-formoterol [facility]) 100-5 MCG/ACT AERO 2 puffs 2 times daily as needed for respiratory illness or flare. 10/31/22  Yes Padgett, Pilar Grammes, MD  mupirocin ointment (BACTROBAN) 2 % Apply 1 Application topically 2 (two) times daily.  04/24/23  Yes Georgian Co M, PA-C  Olopatadine HCl (PATADAY) 0.2 % SOLN 1 drop per eye daily as needed for itchy/watery eyes. 10/31/22  Yes Padgett, Pilar Grammes, MD  pantoprazole (PROTONIX) 40 MG tablet Take 1 tablet (40 mg total) by mouth daily. FOR ACID 10/02/22  Yes Claiborne Rigg, NP  senna-docusate (SENOKOT-S) 8.6-50 MG tablet Take 2 tablets by mouth daily. CONSTIPATION 10/02/22  Yes Claiborne Rigg, NP  tretinoin (RETIN-A) 0.025 % cream Apply topically at bedtime. 11/13/21  Yes Claiborne Rigg, NP  VENTOLIN HFA 108 (90 Base) MCG/ACT inhaler Inhale 2 puffs into the lungs every 6 (six) hours as needed for wheezing or shortness of breath. 10/31/22  Yes Padgett, Pilar Grammes, MD    ROS: Neg HEENT Neg resp Neg cardiac Neg GI Neg GU Neg MS Neg psych Neg neuro  Objective:   Vitals:   04/24/23 1601  BP: 118/84  Pulse: 91  SpO2: 97%  Weight: 195 lb 6.4 oz (88.6 kg)   Exam General appearance : Awake, alert, not in any distress. Speech Clear. Not toxic looking HEENT: Atraumatic and Normocephalic, small scratch to R side of face.  No active infection Neck: Supple, no JVD. No cervical lymphadenopathy.  Chest: Good air entry bilaterally, CTAB.  No rales/rhonchi/wheezing CVS: S1 S2 regular, no murmurs.  Extremities: B/L Lower Ext shows no edema, both legs  are warm to touch Neurology: Awake alert, and oriented X 3, CN II-XII intact, Non focal Skin: No Rash  Data Review Lab Results  Component Value Date   HGBA1C 5.8 (H) 10/02/2022   HGBA1C 5.6 01/15/2022   HGBA1C 5.5 09/20/2021    Assessment & Plan   1. Muscle spasm - cyclobenzaprine (FLEXERIL) 5 MG tablet; Take 1 tablet (5 mg total) by mouth 3 (three) times daily as needed for muscle spasms.  Dispense: 30 tablet; Refill: 3  2. Abrasion, face w/o infection - mupirocin ointment (BACTROBAN) 2 %; Apply 1 Application topically 2 (two) times daily.  Dispense: 22 g; Refill: 0  3. COVID resolved  4. Encounter for  examination following treatment at hospital     Return in about 3 months (around 07/25/2023) for Zelda for chronic conditions and recheck A1C.  The patient was given clear instructions to go to ER or return to medical center if symptoms don't improve, worsen or new problems develop. The patient verbalized understanding. The patient was told to call to get lab results if they haven't heard anything in the next week.      Georgian Co, PA-C [facility] [place], [place] [phone]   04/24/2023, 4:38 PMPatient ID: Michaela Moran, female   DOB: 1980-11-05, 42 y.o.   MRN: [id number]

## 2023-05-01 ENCOUNTER — Ambulatory Visit: Payer: Self-pay | Admitting: Allergy

## 2023-05-01 DIAGNOSIS — J309 Allergic rhinitis, unspecified: Secondary | ICD-10-CM

## 2023-05-23 NOTE — Progress Notes (Signed)
This encounter was created in error - please disregard.

## 2023-06-14 ENCOUNTER — Ambulatory Visit: Payer: Self-pay | Admitting: *Deleted

## 2023-06-14 ENCOUNTER — Telehealth: Payer: Self-pay | Admitting: *Deleted

## 2023-06-14 NOTE — Telephone Encounter (Signed)
Patient is calling with cough, SOB, chest discomfort and has been triaged for her symptoms. Patient has another concern: Patient is leaving country to visit her mother in [place] 10/30-11/30. She needs to cancel her appointment and is wanting to come into the office before she leaves for her follow up. There are no open appointments. Patient advised I would send message to provider to see if the appointment can be moved up- please call her to let her know.

## 2023-06-14 NOTE — Telephone Encounter (Signed)
Reason for Disposition  [1] Longstanding difficulty breathing (e.g., CHF, COPD, emphysema) AND [2] WORSE than normal  Answer Assessment - Initial Assessment Questions 1. RESPIRATORY STATUS: "Describe your breathing?" (e.g., wheezing, shortness of breath, unable to speak, severe coughing)      Coughing, chest pain 2. ONSET: "When did this breathing problem begin?"      Last week 3. PATTERN "Does the difficult breathing come and go, or has it been constant since it started?"      Last month + COVID, coughing- with pain 4. SEVERITY: "How bad is your breathing?" (e.g., mild, moderate, severe)    - MILD: No SOB at rest, mild SOB with walking, speaks normally in sentences, can lie down, no retractions, pulse < 100.    - MODERATE: SOB at rest, SOB with minimal exertion and prefers to sit, cannot lie down flat, speaks in phrases, mild retractions, audible wheezing, pulse 100-120.    - SEVERE: Very SOB at rest, speaks in single words, struggling to breathe, sitting hunched forward, retractions, pulse > 120      Patient has asthma- she has coughing fits, patient does not have treatment for the asthma 5. RECURRENT SYMPTOM: "Have you had difficulty breathing before?" If Yes, ask: "When was the last time?" and "What happened that time?"      Yes- patient has been treated 6. CARDIAC HISTORY: "Do you have any history of heart disease?" (e.g., heart attack, angina, bypass surgery, angioplasty)      Chest pain 7. LUNG HISTORY: "Do you have any history of lung disease?"  (e.g., pulmonary embolus, asthma, emphysema)     *No Answer* 8. CAUSE: "What do you think is causing the breathing problem?"      Possible long COVID- cough prolonged 9. OTHER SYMPTOMS: "Do you have any other symptoms? (e.g., dizziness, runny nose, cough, chest pain, fever)     Nasal congestion, cough  Protocols used: Breathing Difficulty-A-AH

## 2023-06-14 NOTE — Telephone Encounter (Signed)
Dispostion noted.

## 2023-06-17 NOTE — Telephone Encounter (Signed)
Call returned, Message left

## 2023-06-17 NOTE — Telephone Encounter (Signed)
Pt returned call to office.

## 2023-06-17 NOTE — Telephone Encounter (Signed)
Call placed to patient unable to reach message left on VM.   

## 2023-06-18 NOTE — Telephone Encounter (Signed)
Per patient request appointment for 07/03/23 with PCP.

## 2023-06-18 NOTE — Telephone Encounter (Signed)
Call placed to patient unable to reach message left on VM.   

## 2023-06-28 ENCOUNTER — Emergency Department (HOSPITAL_COMMUNITY)
Admission: EM | Admit: 2023-06-28 | Discharge: 2023-06-28 | Disposition: A | Discharge status: Home / Self Care | Payer: Self-pay | Attending: Emergency Medicine | Admitting: Emergency Medicine

## 2023-06-28 ENCOUNTER — Emergency Department (HOSPITAL_COMMUNITY): Payer: Self-pay

## 2023-06-28 ENCOUNTER — Encounter (HOSPITAL_COMMUNITY): Payer: Self-pay | Admitting: Emergency Medicine

## 2023-06-28 ENCOUNTER — Other Ambulatory Visit: Payer: Self-pay

## 2023-06-28 DIAGNOSIS — R052 Subacute cough: Secondary | ICD-10-CM | POA: Insufficient documentation

## 2023-06-28 DIAGNOSIS — R109 Unspecified abdominal pain: Secondary | ICD-10-CM

## 2023-06-28 DIAGNOSIS — R1032 Left lower quadrant pain: Secondary | ICD-10-CM | POA: Insufficient documentation

## 2023-06-28 LAB — HCG, SERUM, QUALITATIVE: Preg, Serum: NEGATIVE

## 2023-06-28 LAB — CBC
HCT: 41.5 % (ref 36.0–46.0)
Hemoglobin: 13.7 g/dL (ref 12.0–15.0)
MCH: 30.4 pg (ref 26.0–34.0)
MCHC: 33 g/dL (ref 30.0–36.0)
MCV: 92.2 fL (ref 80.0–100.0)
Platelets: 321 10*3/uL (ref 150–400)
RBC: 4.5 MIL/uL (ref 3.87–5.11)
RDW: 13 % (ref 11.5–15.5)
WBC: 7.3 10*3/uL (ref 4.0–10.5)
nRBC: 0 % (ref 0.0–0.2)

## 2023-06-28 LAB — URINALYSIS, ROUTINE W REFLEX MICROSCOPIC
Bilirubin Urine: NEGATIVE
Glucose, UA: NEGATIVE mg/dL
Hgb urine dipstick: NEGATIVE
Ketones, ur: NEGATIVE mg/dL
Leukocytes,Ua: NEGATIVE
Nitrite: NEGATIVE
Protein, ur: NEGATIVE mg/dL
Specific Gravity, Urine: 1.004 — ABNORMAL LOW (ref 1.005–1.030)
pH: 6 (ref 5.0–8.0)

## 2023-06-28 LAB — BASIC METABOLIC PANEL
Anion gap: 10 (ref 5–15)
BUN: 11 mg/dL (ref 6–20)
CO2: 26 mmol/L (ref 22–32)
Calcium: 9.3 mg/dL (ref 8.9–10.3)
Chloride: 101 mmol/L (ref 98–111)
Creatinine, Ser: 0.83 mg/dL (ref 0.44–1.00)
GFR, Estimated: >60 mL/min (ref >60)
Glucose, Bld: 81 mg/dL (ref 70–99)
Potassium: 3.6 mmol/L (ref 3.5–5.1)
Sodium: 137 mmol/L (ref 135–145)

## 2023-06-28 LAB — TROPONIN I (HIGH SENSITIVITY)
Troponin I (High Sensitivity): <2 ng/L (ref <18)
Troponin I (High Sensitivity): 3 ng/L (ref <18)

## 2023-06-28 MED ORDER — IOHEXOL 350 MG/ML SOLN
75.0000 mL | Freq: Once | INTRAVENOUS | Status: AC | PRN
  Administered 2023-06-28: 75 mL via INTRAVENOUS

## 2023-06-28 MED ORDER — ALBUTEROL SULFATE HFA 108 (90 BASE) MCG/ACT IN AERS
1.0000 | INHALATION_SPRAY | Freq: Four times a day (QID) | RESPIRATORY_TRACT | 0 refills | Status: DC | PRN
Start: 2023-06-28 — End: 2024-03-18

## 2023-06-28 MED ORDER — BENZONATATE 100 MG PO CAPS: 100.0000 mg | ORAL_CAPSULE | Freq: Three times a day (TID) | ORAL | 0 refills | Status: AC | PRN

## 2023-06-28 NOTE — ED Provider Notes (Signed)
[place] EM[facility]GENCY DEPARTMENT AT [facility] Provider Note   CSN: [id number] Arrival date & time: 06/28/23  4782     History  Chief Complaint  Patient presents with   Cough   Flank Pain   Fatigue    Michaela Moran is a 42 y.o. female with a history of chronic pelvic pain who presents the ED today for cough and left flank pain.  She reports she was diagnosed with COVID 2 months ago, and since then has been having a cough. Additionally, patient reports left flank pain that started about 3 weeks ago.  She reports constant pain that sometimes radiates to the left lower quadrant.  Pain is worse with movement.  She states that OTC analgesics help with the pain.  No fever, N/V/D, or dysuria.  No additional complaints or concerns at this time.    Home Medications Prior to Admission medications   Medication Sig Start Date End Date Taking? Authorizing Provider  albuterol (VENTOLIN HFA) 108 (90 Base) MCG/ACT inhaler Inhale 1-2 puffs into the lungs every 6 (six) hours as needed for wheezing or shortness of breath. 06/28/23 07/28/23 Yes Maxwell Marion, PA-C  benzonatate (TESSALON) 100 MG capsule Take 1 capsule (100 mg total) by mouth 3 (three) times daily as needed for cough. 06/28/23 07/28/23 Yes Maxwell Marion, PA-C  cyclobenzaprine (FLEXERIL) 5 MG tablet Take 1 tablet (5 mg total) by mouth 3 (three) times daily as needed for muscle spasms. 04/24/23   Anders Simmonds, PA-C  Dapsone (ACZONE) 5 % topical gel Apply topically 2 times per day 09/20/21   Claiborne Rigg, NP  DYMISTA 137-50 MCG/ACT SUSP 1-2 times daily as needed for nasal stuffiness or drainage 10/31/22   Marcelyn Bruins, MD  levocetirizine (XYZAL) 5 MG tablet Take 1 tablet (5 mg total) by mouth every evening. 10/31/22   Marcelyn Bruins, MD  mometasone-formoterol (DULERA) 100-5 MCG/ACT AERO 2 puffs 2 times daily as needed for respiratory illness or flare. 10/31/22   Marcelyn Bruins, MD  mupirocin ointment  (BACTROBAN) 2 % Apply 1 Application topically 2 (two) times daily. 04/24/23   Anders Simmonds, PA-C  Olopatadine HCl (PATADAY) 0.2 % SOLN 1 drop per eye daily as needed for itchy/watery eyes. 10/31/22   Marcelyn Bruins, MD  pantoprazole (PROTONIX) 40 MG tablet Take 1 tablet (40 mg total) by mouth daily. FOR ACID 10/02/22   Claiborne Rigg, NP  senna-docusate (SENOKOT-S) 8.6-50 MG tablet Take 2 tablets by mouth daily. CONSTIPATION 10/02/22   Claiborne Rigg, NP  tretinoin (RETIN-A) 0.025 % cream Apply topically at bedtime. 11/13/21   Claiborne Rigg, NP  VENTOLIN HFA 108 (90 Base) MCG/ACT inhaler Inhale 2 puffs into the lungs every 6 (six) hours as needed for wheezing or shortness of breath. 10/31/22   Marcelyn Bruins, MD      Allergies    Other    Review of Systems   Review of Systems  Respiratory:  Positive for cough.   Genitourinary:  Positive for flank pain.  All other systems reviewed and are negative.   Physical Exam Updated Vital Signs BP 120/83   Pulse (!) 56   Temp 97.7 F (36.5 C) (Oral)   Resp 18   Ht 5\' 7"  (1.702 m)   Wt 88 kg   SpO2 100%   BMI 30.39 kg/m  Physical Exam Vitals and nursing note reviewed.  Constitutional:      General: She is not in acute distress.  Appearance: Normal appearance.  HENT:     Head: Normocephalic and atraumatic.     Mouth/Throat:     Mouth: Mucous membranes are moist.  Eyes:     Conjunctiva/sclera: Conjunctivae normal.     Pupils: Pupils are equal, round, and reactive to light.  Cardiovascular:     Rate and Rhythm: Normal rate and regular rhythm.     Pulses: Normal pulses.     Heart sounds: Normal heart sounds.  Pulmonary:     Effort: Pulmonary effort is normal.     Breath sounds: Normal breath sounds.  Abdominal:     Palpations: Abdomen is soft.     Tenderness: There is abdominal tenderness. There is left CVA tenderness. There is no right CVA tenderness, guarding or rebound.     Comments: LLQ tenderness  to palpation  Musculoskeletal:     Comments: No midline tenderness  Skin:    General: Skin is warm and dry.     Findings: No rash.  Neurological:     General: No focal deficit present.     Mental Status: She is alert.     Sensory: No sensory deficit.     Motor: No weakness.  Psychiatric:        Mood and Affect: Mood normal.        Behavior: Behavior normal.     ED Results / Procedures / Treatments   Labs (all labs ordered are listed, but only abnormal results are displayed) Labs Reviewed  URINALYSIS, ROUTINE W REFLEX MICROSCOPIC - Abnormal; Notable for the following components:      Result Value   Color, Urine STRAW (*)    Specific Gravity, Urine 1.004 (*)    All other components within normal limits  BASIC METABOLIC PANEL  CBC  HCG, SERUM, QUALITATIVE  TROPONIN I (HIGH SENSITIVITY)  TROPONIN I (HIGH SENSITIVITY)    EKG None  Radiology CT ABDOMEN PELVIS W CONTRAST  Result Date: 06/28/2023 CLINICAL DATA:  Left lower quadrant abdominal pain.  Dysuria. EXAM: CT ABDOMEN AND PELVIS WITH CONTRAST TECHNIQUE: Multidetector CT imaging of the abdomen and pelvis was performed using the standard protocol following bolus administration of intravenous contrast. RADIATION DOSE REDUCTION: This exam was performed according to the departmental dose-optimization program which includes automated exposure control, adjustment of the mA and/or kV according to patient size and/or use of iterative reconstruction technique. CONTRAST:  75mL OMNIPAQUE IOHEXOL 350 MG/ML SOLN COMPARISON:  09/26/2022 FINDINGS: Lower chest: Lung bases are clear. Hepatobiliary: Liver is mildly heterogeneous and there could be mild geographic steatosis. Gallbladder is within normal limits. No biliary dilatation. Main portal venous system is patent. Pancreas: Unremarkable. No pancreatic ductal dilatation or surrounding inflammatory changes. Spleen: Normal in size without focal abnormality. Adrenals/Urinary Tract: Normal adrenal  glands. Normal appearance of both kidneys without stones or hydronephrosis. No suspicious renal lesion. Bladder is moderately distended. Evidence for post surgical changes along the lower anterior abdominal wall and involving the anterior aspect of the bladder. Reportedly, the patient had bladder repair in the past. Focal bladder wall thickening on image 62/3 may represent postoperative changes. Small amount of fluid in the anterior pelvis situated anterior to the uterus and just above the bladder. This is best seen on the sagittal images, image 79/7. Stomach/Bowel: Normal appendix. No evidence bowel dilatation or obstruction. No focal bowel inflammation. Normal appearance of the stomach. Vascular/Lymphatic: No significant vascular findings are present. No enlarged abdominal or pelvic lymph nodes. Reproductive: Peripherally enhancing structure in the left adnexa measures 1.6 cm  and likely represents a small corpus luteal cyst. Otherwise, the uterus and adnexal structures are unremarkable. Other: Postsurgical changes along the anterior midline. Negative for free air. Musculoskeletal: No acute bone abnormality. IMPRESSION: 1. Postsurgical changes along the anterior abdominal wall and probably involving the bladder. Focal wall thickening along the anterior bladder may represent postoperative changes but there is also trace fluid in this area. Fluid is nonspecific but could be physiologic. Recommend correlating with urinalysis. 2. Probable small corpus luteal cyst in the left adnexa. Electronically Signed   By: Richarda Overlie M.D.   On: 06/28/2023 12:12   DG Chest 2 View  Result Date: 06/28/2023 CLINICAL DATA:  Persistent chest pain, cough, and weakness following COVID infection EXAM: CHEST - 2 VIEW COMPARISON:  Chest radiograph dated 08/28/2021 FINDINGS: Normal lung volumes. No focal consolidations. No pleural effusion or pneumothorax. The heart size and mediastinal contours are within normal limits. No acute osseous  abnormality. IMPRESSION: No active cardiopulmonary disease. Electronically Signed   By: Agustin Cree M.D.   On: 06/28/2023 09:30    Procedures Procedures: not indicated.   Medications Ordered in ED Medications  iohexol (OMNIPAQUE) 350 MG/ML injection 75 mL (75 mLs Intravenous Contrast Given 06/28/23 1038)    ED Course/ Medical Decision Making/ A&P                                 Medical Decision Making Amount and/or Complexity of Data Reviewed Labs: ordered. Radiology: ordered.  Risk Prescription drug management.   This patient presents to the ED for concern of cough and left flank pain, this involves an extensive number of treatment options, and is a complaint that carries with it a high risk of complications and morbidity.   Differential diagnosis includes: Bronchitis, pneumonia, URI, UTI, pyelonephritis, urolithiasis, nephrolithiasis, constipation, diverticulitis, IBS, IBD, etc.   Comorbidities  See HPI above   Additional History  Additional history obtained from previous PCP notes.   Cardiac Monitoring / EKG  The patient was maintained on a cardiac monitor.  I personally viewed and interpreted the cardiac monitored which showed: sinus rhythm with nonspecific T-wave abnormality, heart rate of 73 bpm.   Lab Tests  I ordered and personally interpreted labs.  The pertinent results include:   Unremarkable BMP and CBC Normal UA Negative hCG Negative troponins   Imaging Studies  I ordered imaging studies including CXR and CT abdomen/pelvis I independently visualized and interpreted imaging which showed:  CXR showed no active cardiopulmonary disease CT showed focal wall thickening along the anterior bladder may represent postoperative changes but there is also trace fluid in the area.  Fluid is nonspecific but could be physiologic.  Recommend correlating with urinalysis.  Probable small corpus luteal cyst in the left adnexa. I agree with the radiologist  interpretation   Problem List / ED Course / Critical Interventions / Medication Management  Cough for the past 2 months and left flank pain for the past 3 weeks. Patient denied the need for pain medication at this time. I have reviewed the patients home medicines and have made adjustments as needed.   Social Determinants of Health  Access to healthcare   Test / Admission - Considered  I discussed findings with patient and husband at bedside.  All questions were answered. Patient is hemodynamically stable and safe for discharge home. Prescriptions for Tessalon Perles and albuterol inhaler sent to the pharmacy. Return precautions provided.  Final Clinical Impression(s) / ED Diagnoses Final diagnoses:  Subacute cough  Left flank pain    Rx / DC Orders ED Discharge Orders          Ordered    benzonatate (TESSALON) 100 MG capsule  3 times daily PRN        06/28/23 1236    albuterol (VENTOLIN HFA) 108 (90 Base) MCG/ACT inhaler  Every 6 hours PRN        06/28/23 1236              Maxwell Marion, PA-C 06/28/23 1450    Linwood Dibbles, MD 06/28/23 1743

## 2023-06-28 NOTE — ED Triage Notes (Signed)
Pt reports since August diagnosis of COVID, pt reports she has had cough, weakness with normal activities, and left flank pain. PT awake, alert, appropriate. Pt reports dysuria. Pt reports normal BM. LMP 06/18/2023.

## 2023-06-28 NOTE — ED Notes (Signed)
This RN reviewed discharge instructions with patient. She verbalized understanding and denied any further questions. PT well appearing upon discharge and reports tolerable pain. Pt ambulated with stable gait to exit. Pt endorses ride home.  

## 2023-06-28 NOTE — Discharge Instructions (Addendum)
As discussed, your imaging and labs are reassuring.  You have a small left-sided corpus luteal cyst.  This is a benign finding.  If you would like, you can follow-up with your OB/GYN for outpatient ultrasound.  I have sent a prescription for Tessalon Perles and an albuterol inhaler to your pharmacy.  Please use as needed for cough and shortness of breath respectively.  Continue taking Tylenol and ibuprofen for back pain.  You can also get lidocaine patches over-the-counter and put on the area of pain once a day.

## 2023-07-03 ENCOUNTER — Other Ambulatory Visit: Payer: Self-pay

## 2023-07-03 ENCOUNTER — Other Ambulatory Visit (HOSPITAL_COMMUNITY)
Admission: RE | Admit: 2023-07-03 | Discharge: 2023-07-03 | Disposition: A | Discharge status: Home / Self Care | Payer: Self-pay | Source: Ambulatory Visit | Attending: Family Medicine | Admitting: Family Medicine

## 2023-07-03 ENCOUNTER — Ambulatory Visit: Payer: Self-pay | Attending: Family Medicine | Admitting: Family Medicine

## 2023-07-03 ENCOUNTER — Encounter: Payer: Self-pay | Admitting: Family Medicine

## 2023-07-03 VITALS — BP 109/77 | HR 98 | Ht 67.0 in | Wt 202.6 lb

## 2023-07-03 DIAGNOSIS — Z23 Encounter for immunization: Secondary | ICD-10-CM

## 2023-07-03 DIAGNOSIS — R519 Headache, unspecified: Secondary | ICD-10-CM

## 2023-07-03 DIAGNOSIS — K219 Gastro-esophageal reflux disease without esophagitis: Secondary | ICD-10-CM

## 2023-07-03 DIAGNOSIS — N898 Other specified noninflammatory disorders of vagina: Secondary | ICD-10-CM | POA: Insufficient documentation

## 2023-07-03 DIAGNOSIS — M62838 Other muscle spasm: Secondary | ICD-10-CM

## 2023-07-03 DIAGNOSIS — R7303 Prediabetes: Secondary | ICD-10-CM

## 2023-07-03 LAB — POCT GLYCOSYLATED HEMOGLOBIN (HGB A1C): HbA1c, POC (prediabetic range): 5.7 % (ref 5.7–6.4)

## 2023-07-03 MED ORDER — CYCLOBENZAPRINE HCL 5 MG PO TABS
5.0000 mg | ORAL_TABLET | Freq: Three times a day (TID) | ORAL | 3 refills | Status: DC | PRN
  Filled 2023-07-03: qty 30, 10d supply, fill #0

## 2023-07-03 MED ORDER — CLOTRIMAZOLE 1 % EX CREA
1.0000 | TOPICAL_CREAM | Freq: Two times a day (BID) | CUTANEOUS | 1 refills | Status: DC
  Filled 2023-07-03: qty 30, 15d supply, fill #0

## 2023-07-03 MED ORDER — PANTOPRAZOLE SODIUM 40 MG PO TBEC
40.0000 mg | DELAYED_RELEASE_TABLET | Freq: Every day | ORAL | 3 refills | Status: DC
  Filled 2023-07-03: qty 30, 30d supply, fill #0

## 2023-07-03 NOTE — Progress Notes (Signed)
Subjective:  Patient ID: Michaela Moran, female    DOB: 1981/01/04  Age: 42 y.o. MRN: [id number]  CC: Headache (Vagianl Itching)   HPI Michaela Moran is a 42 y.o. year old female patient of Bertram Denver, FNP with a history of GERD, Prediabetes.  Interval History: Discussed the use of AI scribe software for clinical note transcription with the patient, who gave verbal consent to proceed.  She presents with a one-week history of daily headaches. The headaches are located on the side of the head and are associated with dizziness. The headaches are triggered by hunger and eating, and are relieved by Tylenol. The patient denies photophobia and nausea. She also reports frequent urination and external genital itching. The patient has been sleeping poorly, averaging 3-5 hours per night, and reports feeling sleepy during the day. 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   The patient also reports a history of acid reflux and muscle pain, for which she has previously been prescribed Pantoprazole and Cyclobenzaprine, respectively. She is requesting refills of these medications. The patient is planning to travel to [place], where malaria is prevalent, and is requesting prophylactic medication.        Past Medical History:  Diagnosis Date   Acid reflux    Female circumcision    Hemorrhoid    History of positive PPD    neg CXR   Hypercholesteremia    Previous cesarean delivery, antepartum condition or complication 04/28/2016   History of C/S x 2     S/P repeat low transverse C-section 06/27/2016    Past Surgical History:  Procedure Laterality Date   ARTERY BIOPSY Right 02/17/2021   Procedure: RIGHT TEMPORAL ARTERY BIOPSY;  Surgeon: Chuck Hint, MD;  Location: [facility] OR;  Service: Vascular;  Laterality: Right;   CESAREAN SECTION     CESAREAN SECTION N/A 01/02/2013   Procedure: CESAREAN SECTION;  Surgeon: Reva Bores, MD;  Location: [facility] ORS;  Service:  Obstetrics;  Laterality: N/A;  Incidental Cyystotomy   CESAREAN SECTION N/A 06/26/2016   Procedure: CESAREAN SECTION;  Surgeon: Mendon Bing, MD;  Location: [facility] BIRTHING SU[facility]ES;  Service: Obstetrics;  Laterality: N/A;   CYSTOTOMY     with repair at 2014 repeat c-section    Family History  Problem Relation Age of Onset   Diabetes Mother    Diabetes Sister    Diabetes Brother    Hypertension Brother    Diabetes Sister    Diabetes Sister    Autism Son    Healthy Son    Healthy Daughter    Other Neg Hx     Social History   Socioeconomic History   Marital status: Married    Spouse name: Not on file   Number of children: Not on file   Years of education: Not on file   Highest education level: Not on file  Occupational History   Not on file  Tobacco Use   Smoking status: Never   Smokeless tobacco: Never  Vaping Use   Vaping status: Never Used  Substance and Sexual Activity   Alcohol use: No   Drug use: No   Sexual activity: Yes    Birth control/protection: None  Other Topics Concern   Not on file  Social History Narrative   Not on file   Social Determinants of Health   Financial Resource Strain: Not on file  Food Insecurity: Not on file  Transportation Needs: Not on file  Physical Activity: Not on file  Stress: Not on file  Social Connections: Not on file    Allergies  Allergen Reactions   Other Swelling    Allergic to peas.    Outpatient Medications Prior to Visit  Medication Sig Dispense Refill   albuterol (VENTOLIN HFA) 108 (90 Base) MCG/ACT inhaler Inhale 1-2 puffs into the lungs every 6 (six) hours as needed for wheezing or shortness of breath. 6.7 g 0   benzonatate (TESSALON) 100 MG capsule Take 1 capsule (100 mg total) by mouth 3 (three) times daily as needed for cough. 90 capsule 0   Dapsone (ACZONE) 5 % topical gel Apply topically 2 times per day 60 g 1   DYMISTA 137-50 MCG/ACT SUSP 1-2 times daily as needed for nasal stuffiness or drainage 23 g  5   levocetirizine (XYZAL) 5 MG tablet Take 1 tablet (5 mg total) by mouth every evening. 30 tablet 5   mometasone-formoterol (DULERA) 100-5 MCG/ACT AERO 2 puffs 2 times daily as needed for respiratory illness or flare. 13 g 5   mupirocin ointment (BACTROBAN) 2 % Apply 1 Application topically 2 (two) times daily. 22 g 0   Olopatadine HCl (PATADAY) 0.2 % SOLN 1 drop per eye daily as needed for itchy/watery eyes. 2.5 mL 5   senna-docusate (SENOKOT-S) 8.6-50 MG tablet Take 2 tablets by mouth daily. CONSTIPATION 60 tablet 5   tretinoin (RETIN-A) 0.025 % cream Apply topically at bedtime. 45 g 1   VENTOLIN HFA 108 (90 Base) MCG/ACT inhaler Inhale 2 puffs into the lungs every 6 (six) hours as needed for wheezing or shortness of breath. 18 g 1   cyclobenzaprine (FLEXERIL) 5 MG tablet Take 1 tablet (5 mg total) by mouth 3 (three) times daily as needed for muscle spasms. 30 tablet 3   pantoprazole (PROTONIX) 40 MG tablet Take 1 tablet (40 mg total) by mouth daily. FOR ACID 30 tablet 3   No facility-administered medications prior to visit.     ROS Review of Systems  Constitutional:  Negative for activity change and appetite change.  HENT:  Negative for sinus pressure and sore throat.   Respiratory:  Negative for chest tightness, shortness of breath and wheezing.   Cardiovascular:  Negative for chest pain and palpitations.  Gastrointestinal:  Negative for abdominal distention, abdominal pain and constipation.  Genitourinary: Negative.   Musculoskeletal: Negative.   Neurological:  Positive for headaches.  Psychiatric/Behavioral:  Negative for behavioral problems and dysphoric mood.     Objective:  BP 109/77   Pulse 98   Ht 5\' 7"  (1.702 m)   Wt 202 lb 9.6 oz (91.9 kg)   SpO2 97%   BMI 31.73 kg/m      07/03/2023    2:00 PM 06/28/2023   12:51 PM 06/28/2023   10:00 AM  BP/Weight  Systolic BP 109 120 120  Diastolic BP 77 83 83  Wt. (Lbs) 202.6    BMI 31.73 kg/m2        Physical  Exam Constitutional:      Appearance: She is well-developed.  Cardiovascular:     Rate and Rhythm: Normal rate.     Heart sounds: Normal heart sounds. No murmur heard. Pulmonary:     Effort: Pulmonary effort is normal.     Breath sounds: Normal breath sounds. No wheezing or rales.  Chest:     Chest wall: No tenderness.  Abdominal:     General: Bowel sounds are normal. There is no distension.  Palpations: Abdomen is soft. There is no mass.     Tenderness: There is no abdominal tenderness.  Musculoskeletal:        General: Normal range of motion.     Right lower leg: No edema.     Left lower leg: No edema.  Neurological:     Mental Status: She is alert and oriented to person, place, and time.  Psychiatric:        Mood and Affect: Mood normal.        Latest Ref Rng & Units 06/28/2023    8:07 AM 09/26/2022    1:33 PM 01/15/2022    2:49 PM  CMP  Glucose 70 - 99 mg/dL 81  98  161   BUN 6 - 20 mg/dL 11  14  11    Creatinine 0.44 - 1.00 mg/dL 0.96  0.45  4.09   Sodium 135 - 145 mmol/L 137  135  141   Potassium 3.5 - 5.1 mmol/L 3.6  3.8  4.1   Chloride 98 - 111 mmol/L 101  102  106   CO2 22 - 32 mmol/L 26  24  24    Calcium 8.9 - 10.3 mg/dL 9.3  8.9  8.9   Total Protein 6.5 - 8.1 g/dL  7.5  6.8   Total Bilirubin 0.3 - 1.2 mg/dL  0.3  <8.1   Alkaline Phos 38 - 126 U/L  77  86   AST 15 - 41 U/L  18  12   ALT 0 - 44 U/L  15  13     Lipid Panel     Component Value Date/Time   CHOL 234 (H) 01/15/2022 1449   TRIG 95 01/15/2022 1449   HDL 52 01/15/2022 1449   CHOLHDL 4.5 (H) 01/15/2022 1449   CHOLHDL 4.8 10/02/2013 1532   VLDL 27 10/02/2013 1532   LDLCALC 165 (H) 01/15/2022 1449    CBC    Component Value Date/Time   WBC 7.3 06/28/2023 0807   RBC 4.50 06/28/2023 0807   HGB 13.7 06/28/2023 0807   HGB 13.6 05/11/2019 1551   HCT 41.5 06/28/2023 0807   HCT 40.8 05/11/2019 1551   PLT 321 06/28/2023 0807   PLT 286 05/11/2019 1551   MCV 92.2 06/28/2023 0807   MCV 91  05/11/2019 1551   MCH 30.4 06/28/2023 0807   MCHC 33.0 06/28/2023 0807   RDW 13.0 06/28/2023 0807   RDW 12.8 05/11/2019 1551   LYMPHSABS 1.9 09/26/2022 1333   LYMPHSABS 3.0 08/08/2018 1645   MONOABS 0.6 09/26/2022 1333   EOSABS 0.1 09/26/2022 1333   EOSABS 0.2 08/08/2018 1645   BASOSABS 0.0 09/26/2022 1333   BASOSABS 0.1 08/08/2018 1645    Lab Results  Component Value Date   HGBA1C 5.7 07/03/2023    Assessment & Plan:      Headache Daily headaches for one week, worse when hungry or after eating. No photophobia or nausea. Relieved by Tylenol. Possible tension headache due to stress and poor sleep. -History is negative for migraine -Continue Tylenol as needed for headache. -Consider stress management techniques and improving sleep hygiene.  Prediabetes  Last A1c was 5.8, today's A1c is 5.7. -No change in management. Continue lifestyle modifications.  Vaginal Itching External vaginal itching with no discharge or dysuria. -Placed on topical antifungal  -Perform vaginal swab and send to lab for analysis.  Gastroesophageal Reflux Disease (GERD) Request for refill of Pantoprazole. -Refill Pantoprazole prescription.  Muscle Pain Request for refill of Cyclobenzaprine. -Refill Cyclobenzaprine  prescription.  Malaria Prophylaxis Traveling to malaria-endemic region next week. -Check with health department for availability of Mefloquine as it is not available at the pharmacy.          Meds ordered this encounter  Medications   pantoprazole (PROTONIX) 40 MG tablet    Sig: Take 1 tablet (40 mg total) by mouth daily. FOR ACID    Dispense:  30 tablet    Refill:  3   cyclobenzaprine (FLEXERIL) 5 MG tablet    Sig: Take 1 tablet (5 mg total) by mouth 3 (three) times daily as needed for muscle spasms.    Dispense:  30 tablet    Refill:  3   clotrimazole (LOTRIMIN) 1 % cream    Sig: Apply 1 Application topically 2 (two) times daily.    Dispense:  30 g    Refill:  1     Follow-up: Return in about 3 months (around 10/03/2023) for Medical conditions with PCP.       Hoy Register, MD, FAAFP. [facility] [place], [place] [phone]   07/03/2023, 5:53 PM

## 2023-07-04 ENCOUNTER — Other Ambulatory Visit: Payer: Self-pay | Admitting: Family Medicine

## 2023-07-04 LAB — CERVICOVAGINAL ANCILLARY ONLY
Bacterial Vaginitis (gardnerella): NEGATIVE
Candida Glabrata: NEGATIVE
Candida Vaginitis: POSITIVE — AB
Chlamydia: NEGATIVE
Comment: NEGATIVE
Comment: NEGATIVE
Comment: NEGATIVE
Comment: NEGATIVE
Comment: NEGATIVE
Comment: NORMAL
Neisseria Gonorrhea: NEGATIVE
Trichomonas: NEGATIVE

## 2023-07-04 MED ORDER — FLUCONAZOLE 150 MG PO TABS: 150.0000 mg | ORAL_TABLET | Freq: Once | ORAL | 0 refills | Status: AC

## 2023-07-08 ENCOUNTER — Ambulatory Visit: Payer: Self-pay | Admitting: *Deleted

## 2023-07-08 ENCOUNTER — Other Ambulatory Visit: Payer: Self-pay

## 2023-07-08 NOTE — Telephone Encounter (Signed)
Reason for Disposition . Caller has medicine question only, adult not sick, AND triager answers question  Answer Assessment - Initial Assessment Questions 1. NAME of MEDICINE: "What medicine(s) are you calling about?"     Antifungal- patient has not been notified of test results- + yeast, she states she did not get medication from pharmacy-clotrimazole (LOTRIMIN) 1 % cream 2. QUESTION: "What is your question?" (e.g., double dose of medicine, side effect)     Call to pharmacy- medication should have been prescribed 3. PRESCRIBER: "Who prescribed the medicine?" Reason: if prescribed by specialist, call should be referred to that group.     PCP 4. SYMPTOMS: "Do you have any symptoms?" If Yes, ask: "What symptoms are you having?"  "How bad are the symptoms (e.g., mild, moderate, severe)     Vaginal itching  Protocols used: Medication Question Call-A-AH

## 2023-07-08 NOTE — Telephone Encounter (Signed)
Patient aware that clotrimazole (LOTRIMIN) 1 % cream is ready and waiting to be pick-up at [facility]

## 2023-07-08 NOTE — Telephone Encounter (Signed)
Patient returned call- she was directed to [facility] for travel medications.

## 2023-07-08 NOTE — Telephone Encounter (Signed)
  Chief Complaint: medication not at pharmacy- clotrimazole (LOTRIMIN) 1 % cream  Symptoms: vaginal itching- was prescribed medication and did not get  Disposition: [] ED /[] Urgent Care (no appt availability in office) / [] Appointment(In office/virtual)/ []  [facility]/ [x] Home Care/ [] Refused Recommended Disposition /[] [place] [facility]/ []  Follow-up with PCP Additional Notes: Patient states she requested medication for malaria prevention - per provider pharmacy may not have- contact [facility]- attempted to contact patient- left message on VM- call office for recommendation- leaving Wednesday

## 2023-07-10 ENCOUNTER — Other Ambulatory Visit: Payer: Self-pay

## 2023-07-31 ENCOUNTER — Ambulatory Visit: Payer: Self-pay | Admitting: Nurse Practitioner

## 2023-10-03 ENCOUNTER — Ambulatory Visit: Payer: Self-pay | Attending: Physician Assistant | Admitting: Physician Assistant

## 2023-10-03 ENCOUNTER — Encounter: Payer: Self-pay | Admitting: Physician Assistant

## 2023-10-03 ENCOUNTER — Other Ambulatory Visit: Payer: Self-pay

## 2023-10-03 VITALS — BP 106/74 | HR 69 | Ht 67.0 in | Wt 199.4 lb

## 2023-10-03 DIAGNOSIS — R7303 Prediabetes: Secondary | ICD-10-CM

## 2023-10-03 DIAGNOSIS — R519 Headache, unspecified: Secondary | ICD-10-CM

## 2023-10-03 DIAGNOSIS — R350 Frequency of micturition: Secondary | ICD-10-CM

## 2023-10-03 DIAGNOSIS — M545 Low back pain, unspecified: Secondary | ICD-10-CM

## 2023-10-03 LAB — POCT URINALYSIS DIP (CLINITEK)
Bilirubin, UA: NEGATIVE
Glucose, UA: NEGATIVE mg/dL
Ketones, POC UA: NEGATIVE mg/dL
Leukocytes, UA: NEGATIVE
Nitrite, UA: NEGATIVE
POC PROTEIN,UA: NEGATIVE
Spec Grav, UA: 1.02 (ref 1.010–1.025)
Urobilinogen, UA: 0.2 U/dL
pH, UA: 6 (ref 5.0–8.0)

## 2023-10-03 MED ORDER — FLUCONAZOLE 150 MG PO TABS
150.0000 mg | ORAL_TABLET | Freq: Once | ORAL | 0 refills | Status: AC
Start: 2023-10-03 — End: 2023-10-04
  Filled 2023-10-03: qty 1, 1d supply, fill #0

## 2023-10-03 MED ORDER — FLUCONAZOLE 150 MG PO TABS
150.0000 mg | ORAL_TABLET | Freq: Once | ORAL | 0 refills | Status: DC
Start: 2023-10-03 — End: 2023-10-03

## 2023-10-03 MED ORDER — GABAPENTIN 300 MG PO CAPS
300.0000 mg | ORAL_CAPSULE | Freq: Three times a day (TID) | ORAL | 3 refills | Status: DC
Start: 2023-10-03 — End: 2023-10-03

## 2023-10-03 MED ORDER — GABAPENTIN 300 MG PO CAPS
300.0000 mg | ORAL_CAPSULE | Freq: Three times a day (TID) | ORAL | 3 refills | Status: DC
Start: 2023-10-03 — End: 2024-03-18
  Filled 2023-10-03: qty 90, 30d supply, fill #0

## 2023-10-03 NOTE — Patient Instructions (Signed)
Work at a goal of eliminating sugary drinks, candy, desserts, sweets, refined sugars, processed foods, and white carbohydrates.    Drink 64 to 80 ounces water daily

## 2023-10-03 NOTE — Progress Notes (Signed)
Patient ID: Michaela Moran, female   DOB: March 26, 1981, 43 y.o.   MRN: [id number]   Michaela Moran, is a 43 y.o. female  [id number]  [id number]  DOB - [birth year]  Chief Complaint  Patient presents with   Headache   Urinary Frequency       Subjective:   Michaela Moran is a 43 y.o. female here today for several issues.  She is very worried about diabetes even those had a normal A1C 3 months ago.  She says she was in [place] in November and ate a lot of carbohydrates and is concerned bc her whole family has diabetes.  She is wanting a RF of gabapentin bc she used it for sciatica a couple of years ago and it really helped.  No weakness.    Also having urinary frequency and occasional HA when she is hungry.  When she eats, the HA goes away.  Urinary frequency is ongoing and she is wanting her kidneys checked.  Some vaginal itching   No problems updated.  ALLERGIES: Allergies  Allergen Reactions   Other Swelling    Allergic to peas.    PAST MEDICAL HISTORY: Past Medical History:  Diagnosis Date   Acid reflux    Female circumcision    Hemorrhoid    History of positive PPD    neg CXR   Hypercholesteremia    Previous cesarean delivery, antepartum condition or complication 04/28/2016   History of C/S x 2     S/P repeat low transverse C-section 06/27/2016    MEDICATIONS AT HOME: Prior to Admission medications   Medication Sig Start Date End Date Taking? Authorizing Provider  clotrimazole (LOTRIMIN) 1 % cream Apply 1 Application topically 2 (two) times daily. 07/03/23  Yes Hoy Register, MD  cyclobenzaprine (FLEXERIL) 5 MG tablet Take 1 tablet (5 mg total) by mouth 3 (three) times daily as needed for muscle spasms. 07/03/23  Yes Hoy Register, MD  Dapsone (ACZONE) 5 % topical gel Apply topically 2 times per day 09/20/21  Yes Claiborne Rigg, NP  DYMISTA 137-50 MCG/ACT SUSP 1-2 times daily as needed for nasal stuffiness or drainage 10/31/22  Yes Padgett, Pilar Grammes, MD  fluconazole  (DIFLUCAN) 150 MG tablet Take 1 tablet (150 mg total) by mouth once for 1 dose. 10/03/23 10/03/23 Yes Jaryd Drew, Marzella Schlein, PA-C  gabapentin (NEURONTIN) 300 MG capsule Take 1 capsule (300 mg total) by mouth 3 (three) times daily. 10/03/23  Yes Georgian Co M, PA-C  levocetirizine (XYZAL) 5 MG tablet Take 1 tablet (5 mg total) by mouth every evening. 10/31/22  Yes Padgett, Pilar Grammes, MD  mometasone-formoterol [facility]) 100-5 MCG/ACT AERO 2 puffs 2 times daily as needed for respiratory illness or flare. 10/31/22  Yes Padgett, Pilar Grammes, MD  mupirocin ointment (BACTROBAN) 2 % Apply 1 Application topically 2 (two) times daily. 04/24/23  Yes Georgian Co M, PA-C  Olopatadine HCl (PATADAY) 0.2 % SOLN 1 drop per eye daily as needed for itchy/watery eyes. 10/31/22  Yes Padgett, Pilar Grammes, MD  pantoprazole (PROTONIX) 40 MG tablet Take 1 tablet (40 mg total) by mouth daily. FOR ACID 07/03/23  Yes Newlin, Enobong, MD  senna-docusate (SENOKOT-S) 8.6-50 MG tablet Take 2 tablets by mouth daily. CONSTIPATION 10/02/22  Yes Claiborne Rigg, NP  tretinoin (RETIN-A) 0.025 % cream Apply topically at bedtime. 11/13/21  Yes Claiborne Rigg, NP  VENTOLIN HFA 108 (90 Base) MCG/ACT inhaler Inhale 2 puffs into the lungs every 6 (six) hours as needed for wheezing or  shortness of breath. 10/31/22  Yes Padgett, Pilar Grammes, MD  albuterol (VENTOLIN HFA) 108 (90 Base) MCG/ACT inhaler Inhale 1-2 puffs into the lungs every 6 (six) hours as needed for wheezing or shortness of breath. 06/28/23 07/28/23  Maxwell Marion, PA-C    ROS: Neg HEENT Neg resp Neg cardiac Neg GI Neg MS Neg psych   Objective:   Vitals:   10/03/23 0831  BP: 106/74  Pulse: 69  SpO2: 97%  Weight: 199 lb 6.4 oz (90.4 kg)  Height: 5\' 7"  (1.702 m)   Exam General appearance : Awake, alert, not in any distress. Speech Clear. Not toxic looking HEENT: Atraumatic and Normocephalic Neck: Supple, no JVD. No cervical lymphadenopathy.   Chest: Good air entry bilaterally, CTAB.  No rales/rhonchi/wheezing CVS: S1 S2 regular, no murmurs.  Extremities: B/L Lower Ext shows no edema, both legs are warm to touch Neurology: Awake alert, and oriented X 3, CN II-XII intact, Non focal Skin: No Rash  Data Review Lab Results  Component Value Date   HGBA1C 5.7 07/03/2023   HGBA1C 5.8 (H) 10/02/2022   HGBA1C 5.6 01/15/2022    Assessment & Plan   1. Urinary frequency (Primary) Increase water intake  - Urine Albumin/Creatinine with ratio (send out) [LAB689] - POCT URINALYSIS DIP (CLINITEK) - Hemoglobin A1c  2. Prediabetes Work at a goal of eliminating sugary drinks, candy, desserts, sweets, refined sugars, processed foods, and white carbohydrates.   - Hemoglobin A1c - fluconazole (DIFLUCAN) 150 MG tablet; Take 1 tablet (150 mg total) by mouth once for 1 dose.  Dispense: 1 tablet; Refill: 0 - Basic Metabolic Panel  3. Acute low back pain, unspecified back pain laterality, unspecified whether sciatica present No red flags - gabapentin (NEURONTIN) 300 MG capsule; Take 1 capsule (300 mg total) by mouth 3 (three) times daily.  Dispense: 90 capsule; Refill: 3  4. Nonintractable headache, unspecified chronicity pattern, unspecified headache type HA that happens when hungry and goes away when she eats - Basic Metabolic Panel    Return in about 4 months (around 01/31/2024) for PCP for chronic conditions-Michaela Moran.  The patient was given clear instructions to go to ER or return to medical center if symptoms don't improve, worsen or new problems develop. The patient verbalized understanding. The patient was told to call to get lab results if they haven't heard anything in the next week.    Georgian Co, PA-C [facility] [place], [place] [phone]   10/03/2023, 9:07 AM

## 2023-10-06 LAB — MICROALBUMIN / CREATININE URINE RATIO
Creatinine, Urine: 126.7 mg/dL
Microalb/Creat Ratio: 9 mg/g{creat} (ref 0–29)
Microalbumin, Urine: 10.8 ug/mL

## 2023-10-06 LAB — BASIC METABOLIC PANEL
BUN/Creatinine Ratio: 21 (ref 9–23)
BUN: 18 mg/dL (ref 6–24)
CO2: 21 mmol/L (ref 20–29)
Calcium: 9.2 mg/dL (ref 8.7–10.2)
Chloride: 102 mmol/L (ref 96–106)
Creatinine, Ser: 0.84 mg/dL (ref 0.57–1.00)
Glucose: 88 mg/dL (ref 70–99)
Potassium: 4.7 mmol/L (ref 3.5–5.2)
Sodium: 138 mmol/L (ref 134–144)
eGFR: 88 mL/min/{1.73_m2} (ref >59)

## 2023-10-06 LAB — HEMOGLOBIN A1C
Est. average glucose Bld gHb Est-mCnc: 123 mg/dL
Hgb A1c MFr Bld: 5.9 % — ABNORMAL HIGH (ref 4.8–5.6)

## 2023-10-07 ENCOUNTER — Telehealth: Payer: Self-pay | Admitting: Nurse Practitioner

## 2023-10-07 ENCOUNTER — Encounter: Payer: Self-pay | Admitting: Physician Assistant

## 2023-10-07 ENCOUNTER — Other Ambulatory Visit: Payer: Self-pay

## 2023-10-07 DIAGNOSIS — K219 Gastro-esophageal reflux disease without esophagitis: Secondary | ICD-10-CM

## 2023-10-07 MED ORDER — PANTOPRAZOLE SODIUM 40 MG PO TBEC: 40.0000 mg | DELAYED_RELEASE_TABLET | Freq: Every day | ORAL | 1 refills | Status: DC

## 2023-10-07 NOTE — Telephone Encounter (Signed)
Pt given lab results per notes of Marylene Land, [place] on 10/07/23. Pt verbalized understanding. Patient requested a refill for Pantoprazole as well.   Anders Simmonds, PA-C 10/07/2023 11:02 AM EST Back to Top    A1C is a little elevated and shows prediabetes.  Work at a goal of eliminating sugary drinks, candy, desserts, sweets, refined sugars, processed foods, and white carbohydrates. Kidney function and electrolytes are good.  Thanks, Georgian Co, PA_C

## 2023-10-07 NOTE — Telephone Encounter (Signed)
Copied from CRM [id number]. Topic: General - Other >> Oct 07, 2023  2:24 PM [facility] C wrote: Reason for CRM: The patient would like to be contacted to review their lab results when possible  Please contact further when available

## 2023-10-07 NOTE — Telephone Encounter (Signed)
Requested Prescriptions  Pending Prescriptions Disp Refills   pantoprazole (PROTONIX) 40 MG tablet 90 tablet 1    Sig: Take 1 tablet (40 mg total) by mouth daily. FOR ACID     Gastroenterology: Proton Pump Inhibitors Passed - 10/07/2023  2:43 PM      Passed - Valid encounter within last 12 months    Recent Outpatient Visits           4 days ago Urinary frequency   [facility] [place]. [facility], Marzella Schlein, [place]   3 months ago Prediabetes   [facility] [place]. [facility] Hoy Register, [place]   5 months ago Muscle spasm   [facility] [place]. [facility], Marylene Land M, [place]   11 months ago Prediabetes   [facility] [facility] [place]. [facility] [place], [place], [place]   1 year ago GERD without esophagitis    [facility] Claiborne Rigg, [place]

## 2023-10-09 NOTE — Telephone Encounter (Signed)
Unable to reach patient by phone to relay results.  Voicemail left to return call for results.

## 2023-10-10 ENCOUNTER — Telehealth: Payer: Self-pay

## 2023-10-10 NOTE — Telephone Encounter (Signed)
-----   Message from [facility] sent at 10/07/2023 11:02 AM EST ----- A1C is a little elevated and shows prediabetes.  Work at a goal of eliminating sugary drinks, candy, desserts, sweets, refined sugars, processed foods, and white carbohydrates. Kidney function and electrolytes are good.  Thanks, Georgian Co, PA_C

## 2023-10-10 NOTE — Telephone Encounter (Signed)
Called patient unable to make contact or leave voicemail, information sent to the nurse pool

## 2024-02-07 ENCOUNTER — Encounter (HOSPITAL_BASED_OUTPATIENT_CLINIC_OR_DEPARTMENT_OTHER): Payer: Self-pay

## 2024-02-07 ENCOUNTER — Other Ambulatory Visit: Payer: Self-pay

## 2024-02-07 ENCOUNTER — Emergency Department (HOSPITAL_BASED_OUTPATIENT_CLINIC_OR_DEPARTMENT_OTHER): Payer: Self-pay

## 2024-02-07 ENCOUNTER — Emergency Department (HOSPITAL_BASED_OUTPATIENT_CLINIC_OR_DEPARTMENT_OTHER)
Admission: EM | Admit: 2024-02-07 | Discharge: 2024-02-07 | Disposition: A | Discharge status: Home / Self Care | Payer: Self-pay | Attending: Emergency Medicine | Admitting: Emergency Medicine

## 2024-02-07 DIAGNOSIS — R42 Dizziness and giddiness: Secondary | ICD-10-CM | POA: Insufficient documentation

## 2024-02-07 DIAGNOSIS — R519 Headache, unspecified: Secondary | ICD-10-CM | POA: Insufficient documentation

## 2024-02-07 DIAGNOSIS — M542 Cervicalgia: Secondary | ICD-10-CM | POA: Insufficient documentation

## 2024-02-07 LAB — CBC WITH DIFFERENTIAL/PLATELET
Abs Immature Granulocytes: 0.01 10*3/uL (ref 0.00–0.07)
Basophils Absolute: 0 10*3/uL (ref 0.0–0.1)
Basophils Relative: 1 %
Eosinophils Absolute: 0.1 10*3/uL (ref 0.0–0.5)
Eosinophils Relative: 3 %
HCT: 38.4 % (ref 36.0–46.0)
Hemoglobin: 13.2 g/dL (ref 12.0–15.0)
Immature Granulocytes: 0 %
Lymphocytes Relative: 40 %
Lymphs Abs: 2.3 10*3/uL (ref 0.7–4.0)
MCH: 30.8 pg (ref 26.0–34.0)
MCHC: 34.4 g/dL (ref 30.0–36.0)
MCV: 89.5 fL (ref 80.0–100.0)
Monocytes Absolute: 0.6 10*3/uL (ref 0.1–1.0)
Monocytes Relative: 10 %
Neutro Abs: 2.7 10*3/uL (ref 1.7–7.7)
Neutrophils Relative %: 46 %
Platelets: 300 10*3/uL (ref 150–400)
RBC: 4.29 MIL/uL (ref 3.87–5.11)
RDW: 13.2 % (ref 11.5–15.5)
WBC: 5.7 10*3/uL (ref 4.0–10.5)
nRBC: 0 % (ref 0.0–0.2)

## 2024-02-07 LAB — COMPREHENSIVE METABOLIC PANEL WITH GFR
ALT: 16 U/L (ref 0–44)
AST: 20 U/L (ref 15–41)
Albumin: 4 g/dL (ref 3.5–5.0)
Alkaline Phosphatase: 86 U/L (ref 38–126)
Anion gap: 11 (ref 5–15)
BUN: 10 mg/dL (ref 6–20)
CO2: 24 mmol/L (ref 22–32)
Calcium: 9 mg/dL (ref 8.9–10.3)
Chloride: 102 mmol/L (ref 98–111)
Creatinine, Ser: 0.72 mg/dL (ref 0.44–1.00)
GFR, Estimated: >60 mL/min (ref >60)
Glucose, Bld: 92 mg/dL (ref 70–99)
Potassium: 3.9 mmol/L (ref 3.5–5.1)
Sodium: 138 mmol/L (ref 135–145)
Total Bilirubin: 0.3 mg/dL (ref 0.0–1.2)
Total Protein: 7.1 g/dL (ref 6.5–8.1)

## 2024-02-07 LAB — CBG MONITORING, ED: Glucose-Capillary: 94 mg/dL (ref 70–99)

## 2024-02-07 NOTE — ED Notes (Signed)
 Pt alert and oriented X 4 at the time of discharge. RR even and unlabored. No acute distress noted. Pt verbalized understanding of discharge instructions as discussed. Pt ambulatory to lobby at time of discharge.

## 2024-02-07 NOTE — Discharge Instructions (Signed)
 No evidence of any acute explanation for the symptoms.  There is not evidence of at C5-C6 neck vertebrae of an old injury that maybe is causing some discomfort now.  No new injury to CT head or CT cervical spine.  Would recommend that you follow-up with your regular doctor.  Would recommend extra strength Tylenol  as needed for pain 2 tablets every 8 hours.

## 2024-02-07 NOTE — ED Provider Notes (Addendum)
 [facility] Provider Note   CSN: [id number] Arrival date & time: 02/07/24  0930     History  Chief Complaint  Patient presents with   Headache   Dizziness    Michaela Moran is a 43 y.o. female.  Patient with a complaint of intermittent headaches for about 3 weeks.  States that a posterior headache that radiates into the neck got worse about a week ago.  No fevers patient has some intermittent dizziness with it no dizziness now no visual disturbances.  No numbness or weakness.  Patient has a history of frequent headaches.  But does not formally have a history of migraines.  This seems to be worse.  Temp here 98 degrees pulse 66 respiration 16 blood pressure 123/85 oxygen saturation is 100% on room air.  Patient appears nontoxic no acute distress.  Past medical history significant for acid reflux hypercholesteremia.  Patient is never used tobacco products.  Blood sugar here is 94.       Home Medications Prior to Admission medications   Medication Sig Start Date End Date Taking? Authorizing Provider  albuterol  (VENTOLIN  HFA) 108 (90 Base) MCG/ACT inhaler Inhale 1-2 puffs into the lungs every 6 (six) hours as needed for wheezing or shortness of breath. 06/28/23 07/28/23  Sonnie Dusky, PA-C  clotrimazole  (LOTRIMIN ) 1 % cream Apply 1 Application topically 2 (two) times daily. 07/03/23   Newlin, Enobong, MD  cyclobenzaprine  (FLEXERIL ) 5 MG tablet Take 1 tablet (5 mg total) by mouth 3 (three) times daily as needed for muscle spasms. 07/03/23   Joaquin Mulberry, MD  Dapsone  (ACZONE ) 5 % topical gel Apply topically 2 times per day 09/20/21   Fleming, Zelda W, NP  DYMISTA  137-50 MCG/ACT SUSP 1-2 times daily as needed for nasal stuffiness or drainage 10/31/22   Brian Campanile, MD  gabapentin  (NEURONTIN ) 300 MG capsule Take 1 capsule (300 mg total) by mouth 3 (three) times daily. 10/03/23   Hassie Lint, PA-C  levocetirizine (XYZAL ) 5 MG  tablet Take 1 tablet (5 mg total) by mouth every evening. 10/31/22   Brian Campanile, MD  mometasone -formoterol  (DULERA ) 100-5 MCG/ACT AERO 2 puffs 2 times daily as needed for respiratory illness or flare. 10/31/22   Brian Campanile, MD  mupirocin  ointment (BACTROBAN ) 2 % Apply 1 Application topically 2 (two) times daily. 04/24/23   Hassie Lint, PA-C  Olopatadine  HCl (PATADAY ) 0.2 % SOLN 1 drop per eye daily as needed for itchy/watery eyes. 10/31/22   Brian Campanile, MD  pantoprazole  (PROTONIX ) 40 MG tablet Take 1 tablet (40 mg total) by mouth daily. FOR ACID 10/07/23   Collins Dean, NP  senna-docusate (SENOKOT-S) 8.6-50 MG tablet Take 2 tablets by mouth daily. CONSTIPATION 10/02/22   Fleming, Zelda W, NP  tretinoin  (RETIN-A ) 0.025 % cream Apply topically at bedtime. 11/13/21   Collins Dean, NP  VENTOLIN  HFA 108 (90 Base) MCG/ACT inhaler Inhale 2 puffs into the lungs every 6 (six) hours as needed for wheezing or shortness of breath. 10/31/22   Brian Campanile, MD      Allergies    Other    Review of Systems   Review of Systems  Constitutional:  Negative for chills and fever.  HENT:  Negative for congestion, ear pain and sore throat.   Eyes:  Negative for photophobia, pain and visual disturbance.  Respiratory:  Negative for cough and shortness of breath.   Cardiovascular:  Negative for chest  pain and palpitations.  Gastrointestinal:  Negative for abdominal pain and vomiting.  Genitourinary:  Negative for dysuria and hematuria.  Musculoskeletal:  Positive for neck pain. Negative for arthralgias, back pain and neck stiffness.  Skin:  Negative for color change and rash.  Neurological:  Positive for dizziness and headaches. Negative for seizures, syncope, speech difficulty, weakness and numbness.  All other systems reviewed and are negative.   Physical Exam Updated Vital Signs BP 123/85   Pulse 66   Temp 98 F (36.7 C)   Resp 16   Wt 83.9 kg    LMP 01/24/2024   SpO2 100%   BMI 28.98 kg/m  Physical Exam Vitals and nursing note reviewed.  Constitutional:      General: She is not in acute distress.    Appearance: Normal appearance. She is well-developed. She is not ill-appearing.  HENT:     Head: Normocephalic and atraumatic.  Eyes:     Extraocular Movements: Extraocular movements intact.     Conjunctiva/sclera: Conjunctivae normal.     Pupils: Pupils are equal, round, and reactive to light.  Cardiovascular:     Rate and Rhythm: Normal rate and regular rhythm.     Heart sounds: No murmur heard. Pulmonary:     Effort: Pulmonary effort is normal. No respiratory distress.     Breath sounds: Normal breath sounds.  Abdominal:     Palpations: Abdomen is soft.     Tenderness: There is no abdominal tenderness.  Musculoskeletal:        General: No swelling.     Cervical back: Neck supple. No rigidity.  Skin:    General: Skin is warm and dry.     Capillary Refill: Capillary refill takes less than 2 seconds.  Neurological:     General: No focal deficit present.     Mental Status: She is alert and oriented to person, place, and time.     Cranial Nerves: No cranial nerve deficit.     Sensory: No sensory deficit.     Motor: No weakness.  Psychiatric:        Mood and Affect: Mood normal.     ED Results / Procedures / Treatments   Labs (all labs ordered are listed, but only abnormal results are displayed) Labs Reviewed  CBC WITH DIFFERENTIAL/PLATELET  COMPREHENSIVE METABOLIC PANEL WITH GFR  CBG MONITORING, ED    EKG EKG Interpretation Date/Time:  Friday Feb 07 2024 09:52:35 EDT Ventricular Rate:  67 PR Interval:  158 QRS Duration:  96 QT Interval:  419 QTC Calculation: 443 R Axis:   44  Text Interpretation: Sinus rhythm Low voltage, precordial leads Confirmed by Toyna Erisman [phone]) on 02/07/2024 9:57:04 AM  Radiology CT Head Wo Contrast Result Date: 02/07/2024 CLINICAL DATA:  Headache, increasing  frequency or severity. Posterior headache and neck pain for 3 weeks. EXAM: CT HEAD WITHOUT CONTRAST CT CERVICAL SPINE WITHOUT CONTRAST TECHNIQUE: Multidetector CT imaging of the head and cervical spine was performed following the standard protocol without intravenous contrast. Multiplanar CT image reconstructions of the cervical spine were also generated. RADIATION DOSE REDUCTION: This exam was performed according to the departmental dose-optimization program which includes automated exposure control, adjustment of the mA and/or kV according to patient size and/or use of iterative reconstruction technique. COMPARISON:  05/19/2009. FINDINGS: CT HEAD FINDINGS Brain: No acute intracranial hemorrhage. No CT evidence of acute infarct. No edema, mass effect, or midline shift. The basilar cisterns are patent. Ventricles: The ventricles are normal. Vascular: No hyperdense vessel  or unexpected calcification. Skull: No acute or aggressive finding. Orbits: Orbits are symmetric. Sinuses: Visualized sinuses are clear. Other: Visualized mastoid air cells are unremarkable. CT CERVICAL SPINE FINDINGS Alignment: Straightening and slight reversal of the normal cervical lordosis. No significant listhesis. No facet subluxation or dislocation. Skull base and vertebrae: No compression fracture in the cervical spine. Well corticated osseous fragment adjacent to the anterior C5 inferior endplate which is new since 2010. Additional small osseous fragments adjacent to the anterior aspect of the C6 anterior and superior endplates which are new from prior with well corticated appearance of the adjacent vertebral body. No suspicious osseous lesion. Soft tissues and spinal canal: No prevertebral fluid or swelling. No visible canal hematoma. Disc levels: Intervertebral disc spaces are maintained. No high-grade osseous spinal canal stenosis. No high-grade osseous foraminal stenosis. Upper chest: No acute findings. Other: None. IMPRESSION: No CT  evidence of acute intracranial abnormality. No compression fracture or traumatic malalignment in the cervical spine. Small osseous fragments adjacent to the C5 inferior endplate as well as the C6 superior and inferior endplates which may reflect sequelae of remote trauma. Well corticated appearance without evidence of acute fracture site. Electronically Signed   By: Denny Flack M.D.   On: 02/07/2024 11:13   CT Cervical Spine Wo Contrast Result Date: 02/07/2024 CLINICAL DATA:  Headache, increasing frequency or severity. Posterior headache and neck pain for 3 weeks. EXAM: CT HEAD WITHOUT CONTRAST CT CERVICAL SPINE WITHOUT CONTRAST TECHNIQUE: Multidetector CT imaging of the head and cervical spine was performed following the standard protocol without intravenous contrast. Multiplanar CT image reconstructions of the cervical spine were also generated. RADIATION DOSE REDUCTION: This exam was performed according to the departmental dose-optimization program which includes automated exposure control, adjustment of the mA and/or kV according to patient size and/or use of iterative reconstruction technique. COMPARISON:  05/19/2009. FINDINGS: CT HEAD FINDINGS Brain: No acute intracranial hemorrhage. No CT evidence of acute infarct. No edema, mass effect, or midline shift. The basilar cisterns are patent. Ventricles: The ventricles are normal. Vascular: No hyperdense vessel or unexpected calcification. Skull: No acute or aggressive finding. Orbits: Orbits are symmetric. Sinuses: Visualized sinuses are clear. Other: Visualized mastoid air cells are unremarkable. CT CERVICAL SPINE FINDINGS Alignment: Straightening and slight reversal of the normal cervical lordosis. No significant listhesis. No facet subluxation or dislocation. Skull base and vertebrae: No compression fracture in the cervical spine. Well corticated osseous fragment adjacent to the anterior C5 inferior endplate which is new since 2010. Additional small  osseous fragments adjacent to the anterior aspect of the C6 anterior and superior endplates which are new from prior with well corticated appearance of the adjacent vertebral body. No suspicious osseous lesion. Soft tissues and spinal canal: No prevertebral fluid or swelling. No visible canal hematoma. Disc levels: Intervertebral disc spaces are maintained. No high-grade osseous spinal canal stenosis. No high-grade osseous foraminal stenosis. Upper chest: No acute findings. Other: None. IMPRESSION: No CT evidence of acute intracranial abnormality. No compression fracture or traumatic malalignment in the cervical spine. Small osseous fragments adjacent to the C5 inferior endplate as well as the C6 superior and inferior endplates which may reflect sequelae of remote trauma. Well corticated appearance without evidence of acute fracture site. Electronically Signed   By: Denny Flack M.D.   On: 02/07/2024 11:13    Procedures Procedures    Medications Ordered in ED Medications - No data to display  ED Course/ Medical Decision Making/ A&P  Medical Decision Making Amount and/or Complexity of Data Reviewed Labs: ordered. Radiology: ordered.   Patient with intermittent headaches now for 3 weeks or more.  Has had worsening headache and some neck stiffness for a week.  Clinically not consistent with meningitis.  Will get basic labs CT head.  And will get CT cervical spine.    No dizziness currently.   Workup CBC normal complete metabolic panel normal blood sugar fingerstick was 94.  Blood sugar on the complete metabolic panel was 92 and renal function was normal CT head without any acute findings.  CT cervical spine without anything acute but some evidence of maybe C5 inferior endplate small osteo fragments as well as the C6 superior inferior endplates which may reflect sequela of remote trauma.  Well-corticated appearance without evidence of any acute fracture site.  Is  possible this may be is causing some neck discomfort at this point in time.  Patient can follow-up with primary care doctor.  Final Clinical Impression(s) / ED Diagnoses Final diagnoses:  Bad headache  Neck pain    Rx / DC Orders ED Discharge Orders     None         Nicklas Barns, MD 02/07/24 1004    Nicklas Barns, MD 02/07/24 1231

## 2024-02-07 NOTE — ED Triage Notes (Signed)
 Pt ambulatory to room with steady gait. Complains of posterior headache and neck(muscle pain) x 3 weeks HA is intermittent. Pt concerned due to episode of dizziness PTA. Denies dizziness now. No visual disturbance. Only unsweetened and water  this am

## 2024-02-25 ENCOUNTER — Other Ambulatory Visit: Payer: Self-pay | Admitting: Family Medicine

## 2024-02-25 ENCOUNTER — Other Ambulatory Visit: Payer: Self-pay

## 2024-02-25 DIAGNOSIS — K219 Gastro-esophageal reflux disease without esophagitis: Secondary | ICD-10-CM

## 2024-02-26 ENCOUNTER — Other Ambulatory Visit: Payer: Self-pay

## 2024-02-26 MED ORDER — PANTOPRAZOLE SODIUM 40 MG PO TBEC
40.0000 mg | DELAYED_RELEASE_TABLET | Freq: Every day | ORAL | 0 refills | Status: DC
  Filled 2024-02-26: qty 30, 30d supply, fill #0

## 2024-03-06 ENCOUNTER — Other Ambulatory Visit: Payer: Self-pay

## 2024-03-17 ENCOUNTER — Ambulatory Visit: Payer: Self-pay

## 2024-03-17 NOTE — Telephone Encounter (Signed)
 FYI Only or Action Required?: Action required by provider: request for appointment and requesting a sooner appointment.  Patient was last seen in primary care on 10/03/2023 by Danton Jon HERO, PA-C. Called Nurse Triage reporting Back Pain. Symptoms began a week ago. Interventions attempted: OTC medications: tylenol  and Rest, hydration, or home remedies. Symptoms are: unchanged.  Triage Disposition: See PCP When Office is Open (Within 3 Days)-needs a call back about a sooner appointment  Patient/caregiver understands and will follow disposition?: No, wishes to speak with PCP  Copied from CRM [id number]. Topic: Clinical - Red Word Triage >> Mar 17, 2024  9:53 AM Elle L wrote: Red Word that prompted transfer to Nurse Triage: The patient states she feels like her sugar may be high as she is experiencing pain all over her body and severe back pain. Reason for Disposition  [1] MODERATE back pain (e.g., interferes with normal activities) AND [2] present > 3 days  Answer Assessment - Initial Assessment Questions 1. ONSET: When did the pain begin?      Started last tuesday 2. LOCATION: Where does it hurt? (upper, mid or lower back)     Lower back pain 3. SEVERITY: How bad is the pain?  (e.g., Scale 1-10; mild, moderate, or severe)   - MILD (1-3): Doesn't interfere with normal activities.    - MODERATE (4-7): Interferes with normal activities or awakens from sleep.    - SEVERE (8-10): Excruciating pain, unable to do any normal activities.      Moderate 4. PATTERN: Is the pain constant? (e.g., yes, no; constant, intermittent)      intermittent 5. RADIATION: Does the pain shoot into your legs or somewhere else?     yes 6. CAUSE:  What do you think is causing the back pain?      unsure 7. BACK OVERUSE:  Any recent lifting of heavy objects, strenuous work or exercise?     no 8. MEDICINES: What have you taken so far for the pain? (e.g., nothing, acetaminophen , NSAIDS)     Tylenol  9.  NEUROLOGIC SYMPTOMS: Do you have any weakness, numbness, or problems with bowel/bladder control?     no 10. OTHER SYMPTOMS: Do you have any other symptoms? (e.g., fever, abdomen pain, burning with urination, blood in urine)       Generalized back pain  Protocols used: Back Pain-A-AH

## 2024-03-18 ENCOUNTER — Ambulatory Visit: Payer: Self-pay | Attending: Nurse Practitioner | Admitting: Nurse Practitioner

## 2024-03-18 ENCOUNTER — Other Ambulatory Visit: Payer: Self-pay

## 2024-03-18 ENCOUNTER — Encounter: Payer: Self-pay | Admitting: Nurse Practitioner

## 2024-03-18 ENCOUNTER — Other Ambulatory Visit: Payer: Self-pay | Admitting: Pharmacist

## 2024-03-18 VITALS — BP 102/71 | HR 70 | Resp 20 | Ht 67.0 in | Wt 202.8 lb

## 2024-03-18 DIAGNOSIS — R7303 Prediabetes: Secondary | ICD-10-CM

## 2024-03-18 DIAGNOSIS — N898 Other specified noninflammatory disorders of vagina: Secondary | ICD-10-CM

## 2024-03-18 DIAGNOSIS — E559 Vitamin D deficiency, unspecified: Secondary | ICD-10-CM

## 2024-03-18 DIAGNOSIS — R21 Rash and other nonspecific skin eruption: Secondary | ICD-10-CM

## 2024-03-18 DIAGNOSIS — L719 Rosacea, unspecified: Secondary | ICD-10-CM

## 2024-03-18 DIAGNOSIS — K219 Gastro-esophageal reflux disease without esophagitis: Secondary | ICD-10-CM

## 2024-03-18 DIAGNOSIS — J45909 Unspecified asthma, uncomplicated: Secondary | ICD-10-CM

## 2024-03-18 DIAGNOSIS — J984 Other disorders of lung: Secondary | ICD-10-CM

## 2024-03-18 DIAGNOSIS — Z1231 Encounter for screening mammogram for malignant neoplasm of breast: Secondary | ICD-10-CM

## 2024-03-18 DIAGNOSIS — M545 Low back pain, unspecified: Secondary | ICD-10-CM

## 2024-03-18 DIAGNOSIS — E7800 Pure hypercholesterolemia, unspecified: Secondary | ICD-10-CM

## 2024-03-18 MED ORDER — FLUTICASONE PROPIONATE 50 MCG/ACT NA SUSP
2.0000 | Freq: Every day | NASAL | 6 refills | Status: AC
  Filled 2024-03-18: qty 16, 30d supply, fill #0

## 2024-03-18 MED ORDER — CLINDAMYCIN PHOS (TWICE-DAILY) 1 % EX GEL
1.0000 | Freq: Two times a day (BID) | CUTANEOUS | 0 refills | Status: DC
  Filled 2024-03-18: qty 30, 15d supply, fill #0

## 2024-03-18 MED ORDER — DYMISTA 137-50 MCG/ACT NA SUSP
1.0000 | Freq: Every day | NASAL | 5 refills | Status: DC | PRN
  Filled 2024-03-18: qty 23, fill #0

## 2024-03-18 MED ORDER — DAPSONE 5 % EX GEL
CUTANEOUS | 1 refills | Status: DC
  Filled 2024-03-18: qty 60, fill #0

## 2024-03-18 MED ORDER — ALBUTEROL SULFATE HFA 108 (90 BASE) MCG/ACT IN AERS
1.0000 | INHALATION_SPRAY | Freq: Four times a day (QID) | RESPIRATORY_TRACT | 0 refills | Status: AC | PRN
  Filled 2024-03-18: qty 6.7, 25d supply, fill #0

## 2024-03-18 MED ORDER — CYCLOBENZAPRINE HCL 5 MG PO TABS
5.0000 mg | ORAL_TABLET | Freq: Three times a day (TID) | ORAL | 3 refills | Status: AC | PRN
  Filled 2024-03-18: qty 30, 10d supply, fill #0

## 2024-03-18 MED ORDER — CLOTRIMAZOLE 1 % EX CREA
1.0000 | TOPICAL_CREAM | Freq: Two times a day (BID) | CUTANEOUS | 1 refills | Status: DC
  Filled 2024-03-18: qty 30, 15d supply, fill #0

## 2024-03-18 MED ORDER — AZELASTINE HCL 137 MCG/SPRAY NA SOLN
2.0000 | Freq: Every day | NASAL | 6 refills | Status: AC
  Filled 2024-03-18: qty 30, 30d supply, fill #0

## 2024-03-18 MED ORDER — GABAPENTIN 300 MG PO CAPS
300.0000 mg | ORAL_CAPSULE | Freq: Three times a day (TID) | ORAL | 3 refills | Status: AC
  Filled 2024-03-18: qty 90, 30d supply, fill #0

## 2024-03-18 MED ORDER — PANTOPRAZOLE SODIUM 40 MG PO TBEC
40.0000 mg | DELAYED_RELEASE_TABLET | Freq: Every day | ORAL | 1 refills | Status: AC
  Filled 2024-03-18: qty 90, 90d supply, fill #0

## 2024-03-18 NOTE — Progress Notes (Signed)
 Assessment & Plan:  Michaela Moran was seen today for back pain.  Diagnoses and all orders for this visit:  Acute low back pain, unspecified back pain laterality, unspecified whether sciatica present -     gabapentin  (NEURONTIN ) 300 MG capsule; Take 1 capsule (300 mg total) by mouth 3 (three) times daily. FOR PAIN -     cyclobenzaprine  (FLEXERIL ) 5 MG tablet; Take 1 tablet (5 mg total) by mouth 3 (three) times daily as needed for muscle spasms   Prediabetes A1c at goal Lab Results  Component Value Date   HGBA1C 5.9 (H) 10/03/2023   -     Hemoglobin A1c  GERD without esophagitis -     pantoprazole  (PROTONIX ) 40 MG tablet; Take 1 tablet (40 mg total) by mouth daily. FOR ACID INSTRUCTIONS: Avoid GERD Triggers: acidic, spicy or fried foods, caffeine , coffee, sodas,  alcohol and chocolate.     Breast cancer screening by mammogram -     MS 3D SCR MAMMO BILAT BR (aka MM); Future  Vitamin D  deficiency disease -     VITAMIN D  25 Hydroxy (Vit-D Deficiency, Fractures)  Hypercholesterolemia -     Lipid panel INSTRUCTIONS: Work on a low fat, heart healthy diet and participate in regular aerobic exercise program by working out at least 150 minutes per week; 5 days a week-30 minutes per day. Avoid red meat/beef/steak,  fried foods. junk foods, sodas, sugary drinks, unhealthy snacking, alcohol and smoking.  Drink at least 80 oz of water  per day and monitor your carbohydrate intake daily.    Restrictive airway disease Currently controlled She declines refilling Dulera  today -     albuterol  (VENTOLIN  HFA) 108 (90 Base) MCG/ACT inhaler; Inhale 1-2 puffs into the lungs every 6 (six) hours as needed for wheezing or shortness of breath.  Facial rash -     clindamycin (CLINDAGEL) 1 % gel; 1 Application 2 (two) times daily.  Itching in the vaginal area She does not have vaginal itching today but would like to have cream on hand if needed in the future -     clotrimazole  (LOTRIMIN ) 1 % cream; Apply 1  Application topically 2 (two) times daily. For vaginal itching    Patient has been counseled on age-appropriate routine health concerns for screening and prevention. These are reviewed and up-to-date. Referrals have been placed accordingly. Immunizations are up-to-date or declined.    Subjective:   Chief Complaint  Patient presents with   Back Pain    Michaela Moran 43 y.o. female presents to office today with complaints of low back pain with left sided sciatica.  She has chronic low back pain. Can not recall any specific injury. Aggravating factors: bending, twisting, sitting.  She has chronic low back pain and has an upcoming appt with ortho. States gabapentin  300 mg TID prn has been effective in the past.    Review of Systems  Constitutional:  Negative for fever, malaise/fatigue and weight loss.  HENT: Negative.  Negative for nosebleeds.   Eyes: Negative.  Negative for blurred vision, double vision and photophobia.  Respiratory: Negative.  Negative for cough and shortness of breath.   Cardiovascular: Negative.  Negative for chest pain, palpitations and leg swelling.  Gastrointestinal: Negative.  Negative for heartburn, nausea and vomiting.  Musculoskeletal:  Positive for back pain and myalgias.  Skin:  Positive for itching and rash.  Neurological: Negative.  Negative for dizziness, focal weakness, seizures and headaches.  Psychiatric/Behavioral: Negative.  Negative for suicidal ideas.  Past Medical History:  Diagnosis Date   Acid reflux    Female circumcision    Hemorrhoid    History of positive PPD    neg CXR   Hypercholesteremia    Previous cesarean delivery, antepartum condition or complication 04/28/2016   History of C/S x 2     S/P repeat low transverse C-section 06/27/2016    Past Surgical History:  Procedure Laterality Date   ARTERY BIOPSY Right 02/17/2021   Procedure: RIGHT TEMPORAL ARTERY BIOPSY;  Surgeon: Eliza Lonni RAMAN, MD;  Location: [facility] OR;  Service:  Vascular;  Laterality: Right;   CESAREAN SECTION     CESAREAN SECTION N/A 01/02/2013   Procedure: CESAREAN SECTION;  Surgeon: Glenys RAMAN Birk, MD;  Location: [facility] ORS;  Service: Obstetrics;  Laterality: N/A;  Incidental Cyystotomy   CESAREAN SECTION N/A 06/26/2016   Procedure: CESAREAN SECTION;  Surgeon: Bebe Furry, MD;  Location: [facility];  Service: Obstetrics;  Laterality: N/A;   CYSTOTOMY     with repair at 2014 repeat c-section    Family History  Problem Relation Age of Onset   Diabetes Mother    Diabetes Sister    Diabetes Brother    Hypertension Brother    Diabetes Sister    Diabetes Sister    Autism Son    Healthy Son    Healthy Daughter    Other Neg Hx     Social History Reviewed with no changes to be made today.   Outpatient Medications Prior to Visit  Medication Sig Dispense Refill   albuterol  (VENTOLIN  HFA) 108 (90 Base) MCG/ACT inhaler Inhale 1-2 puffs into the lungs every 6 (six) hours as needed for wheezing or shortness of breath. (Patient not taking: Reported on 03/18/2024) 6.7 g 0   clotrimazole  (LOTRIMIN ) 1 % cream Apply 1 Application topically 2 (two) times daily. (Patient not taking: Reported on 03/18/2024) 30 g 1   cyclobenzaprine  (FLEXERIL ) 5 MG tablet Take 1 tablet (5 mg total) by mouth 3 (three) times daily as needed for muscle spasms. (Patient not taking: Reported on 03/18/2024) 30 tablet 3   Dapsone  (ACZONE ) 5 % topical gel Apply topically 2 times per day (Patient not taking: Reported on 03/18/2024) 60 g 1   DYMISTA  137-50 MCG/ACT SUSP 1-2 times daily as needed for nasal stuffiness or drainage (Patient not taking: Reported on 03/18/2024) 23 g 5   gabapentin  (NEURONTIN ) 300 MG capsule Take 1 capsule (300 mg total) by mouth 3 (three) times daily. (Patient not taking: Reported on 03/18/2024) 90 capsule 3   levocetirizine (XYZAL ) 5 MG tablet Take 1 tablet (5 mg total) by mouth every evening. (Patient not taking: Reported on 03/18/2024) 30 tablet 5    mometasone -formoterol  (DULERA ) 100-5 MCG/ACT AERO 2 puffs 2 times daily as needed for respiratory illness or flare. (Patient not taking: Reported on 03/18/2024) 13 g 5   mupirocin  ointment (BACTROBAN ) 2 % Apply 1 Application topically 2 (two) times daily. (Patient not taking: Reported on 03/18/2024) 22 g 0   Olopatadine  HCl (PATADAY ) 0.2 % SOLN 1 drop per eye daily as needed for itchy/watery eyes. (Patient not taking: Reported on 03/18/2024) 2.5 mL 5   pantoprazole  (PROTONIX ) 40 MG tablet Take 1 tablet (40 mg total) by mouth daily. FOR ACID 30 tablet 0   senna-docusate (SENOKOT-S) 8.6-50 MG tablet Take 2 tablets by mouth daily. CONSTIPATION (Patient not taking: Reported on 03/18/2024) 60 tablet 5   tretinoin  (RETIN-A ) 0.025 % cream Apply topically at bedtime. (Patient not taking: Reported  on 03/18/2024) 45 g 1   VENTOLIN  HFA 108 (90 Base) MCG/ACT inhaler Inhale 2 puffs into the lungs every 6 (six) hours as needed for wheezing or shortness of breath. (Patient not taking: Reported on 03/18/2024) 18 g 1   No facility-administered medications prior to visit.    Allergies  Allergen Reactions   Other Swelling    Allergic to peas.       Objective:    BP 102/71 (BP Location: Left Arm, Patient Position: Sitting, Cuff Size: Normal)   Pulse 70   Resp 20   Ht 5' 7 (1.702 m)   Wt 202 lb 12.8 oz (92 kg)   SpO2 98%   BMI 31.76 kg/m  Wt Readings from Last 3 Encounters:  03/18/24 202 lb 12.8 oz (92 kg)  02/07/24 185 lb (83.9 kg)  10/03/23 199 lb 6.4 oz (90.4 kg)    Physical Exam Vitals and nursing note reviewed.  Constitutional:      Appearance: She is well-developed.  HENT:     Head: Normocephalic and atraumatic.  Cardiovascular:     Rate and Rhythm: Normal rate and regular rhythm.     Heart sounds: Normal heart sounds. No murmur heard.    No friction rub. No gallop.  Pulmonary:     Effort: Pulmonary effort is normal. No tachypnea or respiratory distress.     Breath sounds: Normal breath sounds.  No decreased breath sounds, wheezing, rhonchi or rales.  Chest:     Chest wall: No tenderness.  Abdominal:     General: Bowel sounds are normal.     Palpations: Abdomen is soft.  Musculoskeletal:        General: Normal range of motion.     Cervical back: Normal range of motion.  Skin:    General: Skin is warm and dry.  Neurological:     Mental Status: She is alert and oriented to person, place, and time.     Coordination: Coordination normal.  Psychiatric:        Behavior: Behavior normal. Behavior is cooperative.        Thought Content: Thought content normal.        Judgment: Judgment normal.          Patient has been counseled extensively about nutrition and exercise as well as the importance of adherence with medications and regular follow-up. The patient was given clear instructions to go to ER or return to medical center if symptoms don't improve, worsen or new problems develop. The patient verbalized understanding.   Follow-up: Return in about 6 months (around 09/18/2024).   Haze LELON Servant, FNP-BC [facility] [place], [place] [phone]   03/18/2024, 12:44 PM

## 2024-03-19 ENCOUNTER — Other Ambulatory Visit: Payer: Self-pay

## 2024-03-19 LAB — LIPID PANEL
Chol/HDL Ratio: 5.4 ratio — ABNORMAL HIGH (ref 0.0–4.4)
Cholesterol, Total: 307 mg/dL — ABNORMAL HIGH (ref 100–199)
HDL: 57 mg/dL (ref >39)
LDL Chol Calc (NIH): 227 mg/dL — ABNORMAL HIGH (ref 0–99)
Triglycerides: 128 mg/dL (ref 0–149)
VLDL Cholesterol Cal: 23 mg/dL (ref 5–40)

## 2024-03-19 LAB — VITAMIN D 25 HYDROXY (VIT D DEFICIENCY, FRACTURES): Vit D, 25-Hydroxy: 18.4 ng/mL — ABNORMAL LOW (ref 30.0–100.0)

## 2024-03-19 LAB — HEMOGLOBIN A1C
Est. average glucose Bld gHb Est-mCnc: 117 mg/dL
Hgb A1c MFr Bld: 5.7 % — ABNORMAL HIGH (ref 4.8–5.6)

## 2024-03-21 ENCOUNTER — Ambulatory Visit: Payer: Self-pay | Admitting: Nurse Practitioner

## 2024-03-23 NOTE — Telephone Encounter (Signed)
 Copied from CRM [id number]. Topic: Clinical - Lab/Test Results >> Mar 23, 2024  2:42 PM Selinda RAMAN wrote:  Reason for CRM: The patient called in for her lab results and I relayed what her provider stated but she wants a nurse to call her as she has more questions and doesn't understand some things. She is concerned. Please assist patient further

## 2024-03-25 ENCOUNTER — Ambulatory Visit: Payer: Self-pay | Admitting: Nurse Practitioner

## 2024-05-20 ENCOUNTER — Emergency Department (HOSPITAL_COMMUNITY): Payer: Self-pay

## 2024-05-20 ENCOUNTER — Emergency Department (HOSPITAL_COMMUNITY)
Admission: EM | Admit: 2024-05-20 | Discharge: 2024-05-21 | Disposition: A | Discharge status: Home / Self Care | Payer: Self-pay | Attending: Emergency Medicine | Admitting: Emergency Medicine

## 2024-05-20 ENCOUNTER — Encounter (HOSPITAL_COMMUNITY): Payer: Self-pay | Admitting: Emergency Medicine

## 2024-05-20 ENCOUNTER — Other Ambulatory Visit: Payer: Self-pay

## 2024-05-20 DIAGNOSIS — K7689 Other specified diseases of liver: Secondary | ICD-10-CM | POA: Insufficient documentation

## 2024-05-20 DIAGNOSIS — R109 Unspecified abdominal pain: Secondary | ICD-10-CM

## 2024-05-20 DIAGNOSIS — N9489 Other specified conditions associated with female genital organs and menstrual cycle: Secondary | ICD-10-CM | POA: Insufficient documentation

## 2024-05-20 DIAGNOSIS — K760 Fatty (change of) liver, not elsewhere classified: Secondary | ICD-10-CM | POA: Insufficient documentation

## 2024-05-20 DIAGNOSIS — K769 Liver disease, unspecified: Secondary | ICD-10-CM

## 2024-05-20 DIAGNOSIS — M545 Low back pain, unspecified: Secondary | ICD-10-CM

## 2024-05-20 LAB — URINALYSIS, ROUTINE W REFLEX MICROSCOPIC
Bacteria, UA: NONE SEEN
Bilirubin Urine: NEGATIVE
Glucose, UA: NEGATIVE mg/dL
Ketones, ur: NEGATIVE mg/dL
Leukocytes,Ua: NEGATIVE
Nitrite: NEGATIVE
Protein, ur: NEGATIVE mg/dL
Specific Gravity, Urine: 1.015 (ref 1.005–1.030)
pH: 5 (ref 5.0–8.0)

## 2024-05-20 LAB — BASIC METABOLIC PANEL WITH GFR
Anion gap: 11 (ref 5–15)
BUN: 12 mg/dL (ref 6–20)
CO2: 25 mmol/L (ref 22–32)
Calcium: 9.3 mg/dL (ref 8.9–10.3)
Chloride: 104 mmol/L (ref 98–111)
Creatinine, Ser: 0.79 mg/dL (ref 0.44–1.00)
GFR, Estimated: >60 mL/min (ref >60)
Glucose, Bld: 111 mg/dL — ABNORMAL HIGH (ref 70–99)
Potassium: 3.8 mmol/L (ref 3.5–5.1)
Sodium: 140 mmol/L (ref 135–145)

## 2024-05-20 LAB — CBC
HCT: 40.2 % (ref 36.0–46.0)
Hemoglobin: 13.3 g/dL (ref 12.0–15.0)
MCH: 30.4 pg (ref 26.0–34.0)
MCHC: 33.1 g/dL (ref 30.0–36.0)
MCV: 92 fL (ref 80.0–100.0)
Platelets: 346 K/uL (ref 150–400)
RBC: 4.37 MIL/uL (ref 3.87–5.11)
RDW: 13.2 % (ref 11.5–15.5)
WBC: 6.4 K/uL (ref 4.0–10.5)
nRBC: 0 % (ref 0.0–0.2)

## 2024-05-20 LAB — HCG, SERUM, QUALITATIVE: Preg, Serum: NEGATIVE

## 2024-05-20 NOTE — ED Notes (Signed)
 Per PA, repeat EKG does not need to be completed.

## 2024-05-20 NOTE — ED Provider Notes (Signed)
  EMERGENCY DEPARTMENT AT [facility] Provider Note   CSN: [id number] Arrival date & time: 05/20/24  1850     Patient presents with: Back Pain and Flank Pain   Michaela Moran is a 43 y.o. female.  {Add pertinent medical, surgical, social history, OB history to HPI:6414} 43 year old female with complaint of left flank pain, onset 1 week ago. Has progressed to left side check, head, arm and leg. With fatigue. No associated SHOB, nausea, vomiting. No history of kidney stones. Does report elevated cholesterol at recent PCP visit.  No other complaints or concerns.        Prior to Admission medications   Medication Sig Start Date End Date Taking? Authorizing Provider  albuterol  (VENTOLIN  HFA) 108 (90 Base) MCG/ACT inhaler Inhale 1-2 puffs into the lungs every 6 (six) hours as needed for wheezing or shortness of breath. 03/18/24 04/17/24  Fleming, Zelda W, NP  Azelastine  HCl 137 MCG/SPRAY SOLN Place 2 sprays into both nostrils daily. 03/18/24   Newlin, Enobong, MD  clindamycin  (CLINDAGEL ) 1 % gel 1 Application 2 (two) times daily. 03/18/24   Fleming, Zelda W, NP  clotrimazole  (LOTRIMIN ) 1 % cream Apply 1 Application topically 2 (two) times daily. For vaginal itching 03/18/24   Theotis Haze ORN, NP  cyclobenzaprine  (FLEXERIL ) 5 MG tablet Take 1 tablet (5 mg total) by mouth 3 (three) times daily as needed for muscle spasms. 03/18/24   Fleming, Zelda W, NP  fluticasone  (FLONASE ) 50 MCG/ACT nasal spray Place 2 sprays into both nostrils daily. 03/18/24   Newlin, Enobong, MD  gabapentin  (NEURONTIN ) 300 MG capsule Take 1 capsule (300 mg total) by mouth 3 (three) times daily. FOR PAIN 03/18/24   Fleming, Zelda W, NP  pantoprazole  (PROTONIX ) 40 MG tablet Take 1 tablet (40 mg total) by mouth daily. FOR ACID 03/18/24   Fleming, Zelda W, NP    Allergies: Other    Review of Systems Negative except as per HPI Updated Vital Signs BP 112/75 (BP Location: Left Arm)   Pulse 78   Temp 98.2 F (36.8 C)  (Oral)   Resp 17   Ht 5' 6 (1.676 m)   Wt 88.5 kg   SpO2 99%   BMI 31.47 kg/m   Physical Exam Vitals and nursing note reviewed.  Constitutional:      General: She is not in acute distress.    Appearance: She is well-developed. She is not diaphoretic.  HENT:     Head: Normocephalic and atraumatic.  Eyes:     Conjunctiva/sclera: Conjunctivae normal.  Cardiovascular:     Rate and Rhythm: Normal rate and regular rhythm.     Heart sounds: Normal heart sounds.  Pulmonary:     Effort: Pulmonary effort is normal.     Breath sounds: Normal breath sounds.  Abdominal:     Palpations: Abdomen is soft.     Tenderness: There is generalized abdominal tenderness. There is no right CVA tenderness or left CVA tenderness.  Musculoskeletal:     Cervical back: Neck supple.     Right lower leg: No edema.     Left lower leg: No edema.  Skin:    General: Skin is warm and dry.     Findings: No erythema or rash.  Neurological:     Mental Status: She is alert and oriented to person, place, and time.  Psychiatric:        Behavior: Behavior normal.     (all labs ordered are listed, but  only abnormal results are displayed) Labs Reviewed  URINALYSIS, ROUTINE W REFLEX MICROSCOPIC - Abnormal; Notable for the following components:      Result Value   Hgb urine dipstick SMALL (*)    All other components within normal limits  BASIC METABOLIC PANEL WITH GFR - Abnormal; Notable for the following components:   Glucose, Bld 111 (*)    All other components within normal limits  HCG, SERUM, QUALITATIVE  CBC  HEPATIC FUNCTION PANEL  TROPONIN T, HIGH SENSITIVITY    EKG: None  Radiology: DG Chest 2 View Result Date: 05/20/2024 CLINICAL DATA:  Chest pain and upper back pain EXAM: CHEST - 2 VIEW COMPARISON:  Chest x-ray 06/28/2023 FINDINGS: The heart size and mediastinal contours are within normal limits. Both lungs are clear. The visualized skeletal structures are unremarkable. IMPRESSION: No active  cardiopulmonary disease. Electronically Signed   By: Greig Pique M.D.   On: 05/20/2024 22:41    {Document cardiac monitor, telemetry assessment procedure when appropriate:32947} Procedures   Medications Ordered in the ED - No data to display    {Click here for ABCD2, HEART and other calculators REFRESH Note before signing:1}                              Medical Decision Making Amount and/or Complexity of Data Reviewed Labs: ordered. Radiology: ordered.   This patient presents to the ED for concern of ***, this involves an extensive number of treatment options, and is a complaint that carries with it a high risk of complications and morbidity.  The differential diagnosis includes ***   Co morbidities / Chronic conditions that complicate the patient evaluation  ***   Additional history obtained:  Additional history obtained from EMR External records from outside source obtained and reviewed including ***   Lab Tests:  I Ordered, and personally interpreted labs.  The pertinent results include:  ***   Imaging Studies ordered:  I ordered imaging studies including ***  I independently visualized and interpreted imaging which showed *** I agree with the radiologist interpretation   Cardiac Monitoring: / EKG:  The patient was maintained on a cardiac monitor.  I personally viewed and interpreted the cardiac monitored which showed an underlying rhythm of: ***   Problem List / ED Course / Critical interventions / Medication management  *** I ordered medication including ***   Reevaluation of the patient after these medicines showed that the patient *** I have reviewed the patients home medicines and have made adjustments as needed   Consultations Obtained:  I requested consultation with the ***,  and discussed lab and imaging findings as well as pertinent plan - they recommend: ***   Social Determinants of Health:  ***   Test / Admission -  Considered:  ***   {Document critical care time when appropriate  Document review of labs and clinical decision tools ie CHADS2VASC2, etc  Document your independent review of radiology images and any outside records  Document your discussion with family members, caretakers and with consultants  Document social determinants of health affecting pt's care  Document your decision making why or why not admission, treatments were needed:32947:::1}   Final diagnoses:  None    ED Discharge Orders     None

## 2024-05-20 NOTE — ED Triage Notes (Signed)
 42 y/o female comes in c/o left sided lower back and flank pain that started last week. Pt denies any recent injury, trauma or kidneys tones. Pt also reports felling  very tired all the time.

## 2024-05-21 ENCOUNTER — Ambulatory Visit: Payer: Self-pay | Admitting: Physician Assistant

## 2024-05-21 ENCOUNTER — Ambulatory Visit: Payer: Self-pay

## 2024-05-21 ENCOUNTER — Telehealth: Payer: Self-pay | Admitting: Nurse Practitioner

## 2024-05-21 ENCOUNTER — Other Ambulatory Visit: Payer: Self-pay

## 2024-05-21 ENCOUNTER — Emergency Department (HOSPITAL_COMMUNITY): Payer: Self-pay

## 2024-05-21 ENCOUNTER — Encounter: Payer: Self-pay | Admitting: Physician Assistant

## 2024-05-21 VITALS — BP 107/77 | HR 73 | Ht 66.0 in | Wt 195.0 lb

## 2024-05-21 DIAGNOSIS — K769 Liver disease, unspecified: Secondary | ICD-10-CM

## 2024-05-21 DIAGNOSIS — E559 Vitamin D deficiency, unspecified: Secondary | ICD-10-CM

## 2024-05-21 DIAGNOSIS — M5442 Lumbago with sciatica, left side: Secondary | ICD-10-CM

## 2024-05-21 DIAGNOSIS — K760 Fatty (change of) liver, not elsewhere classified: Secondary | ICD-10-CM

## 2024-05-21 LAB — HEPATIC FUNCTION PANEL
ALT: 15 U/L (ref 0–44)
AST: 19 U/L (ref 15–41)
Albumin: 3.9 g/dL (ref 3.5–5.0)
Alkaline Phosphatase: 86 U/L (ref 38–126)
Bilirubin, Direct: <0.1 mg/dL (ref 0.0–0.2)
Total Bilirubin: 0.3 mg/dL (ref 0.0–1.2)
Total Protein: 6.8 g/dL (ref 6.5–8.1)

## 2024-05-21 LAB — TROPONIN T, HIGH SENSITIVITY: Troponin T High Sensitivity: <15 ng/L (ref 0–19)

## 2024-05-21 MED ORDER — IBUPROFEN 600 MG PO TABS
600.0000 mg | ORAL_TABLET | Freq: Three times a day (TID) | ORAL | 0 refills | Status: DC | PRN
  Filled 2024-05-21: qty 30, 10d supply, fill #0

## 2024-05-21 MED ORDER — IOHEXOL 300 MG/ML  SOLN
100.0000 mL | Freq: Once | INTRAMUSCULAR | Status: AC | PRN
  Administered 2024-05-21: 100 mL via INTRAVENOUS

## 2024-05-21 MED ORDER — VITAMIN D (ERGOCALCIFEROL) 1.25 MG (50000 UNIT) PO CAPS
50000.0000 [IU] | ORAL_CAPSULE | ORAL | 2 refills | Status: DC
  Filled 2024-05-21: qty 4, 28d supply, fill #0

## 2024-05-21 MED ORDER — METHYLPREDNISOLONE 4 MG PO TBPK
ORAL_TABLET | ORAL | 0 refills | Status: DC
  Filled 2024-05-21: qty 21, 6d supply, fill #0

## 2024-05-21 NOTE — Telephone Encounter (Signed)
 Patient left the ER at 3:30 AM. No change in her symptoms and no new symptoms. Patient advised to follow up with her PCP in 2 days No appointments available  FYI Only or Action Required?: Action required by provider: request for appointment.  Patient was last seen in primary care on 03/18/2024 by Theotis Haze ORN, NP.  Called Nurse Triage reporting Hospitalization Follow-up.  Symptoms began 1 week ago.  Interventions attempted: Other: ER visit.  Symptoms are: unchanged.  Triage Disposition: Call PCP When Office is Open  Patient/caregiver understands and will follow disposition?: Yes                    Copied from CRM #[id number]. Topic: Clinical - Red Word Triage >> May 21, 2024  7:54 AM Rosaria BRAVO wrote: Red Word that prompted transfer to Nurse Triage: Pt says she is scared about her recent visit to the ED, concerning her liver. Reason for Disposition  [1] Caller requesting NON-URGENT health information AND [2] PCP's office is the best resource  Answer Assessment - Initial Assessment Questions Patient went to the ER last night Patient got home from the ER at 3:30 AM Patient states that nothing has changed since she left the ER Patient denies any new symptoms She is scared about being told something was going on with her liver and she was advised that she needs to call her PCP and follow up within the next two days. Patient was not given any medication and states that her left flank/left side of her back pain are still the same No appointments were available at this time  Patient is advised that if anything changes to call us  and if anything gets worse to go back to the Emergency Room.  Protocols used: Information Only Call - No Triage-A-AH

## 2024-05-21 NOTE — Telephone Encounter (Signed)
 Patient to the mobile unit today.

## 2024-05-21 NOTE — Patient Instructions (Signed)
 VISIT SUMMARY:  You visited today due to left-sided abdominal pain and concerns about your liver health. A recent CT scan showed mild fatty liver and a liver lesion. We discussed your symptoms, including back pain, hand pain, headaches, and sciatica-like pain. You also have a history of chronic vitamin D  deficiency and borderline high cholesterol.  YOUR PLAN:  -FATTY LIVER (HEPATIC STEATOSIS): Fatty liver disease means there is excess fat in your liver. If unmanaged, it can progress to more serious liver conditions. We recommend you lose about 10% of your body weight, reduce carbohydrate intake, eat more whole foods, and engage in regular physical activity like walking. We will also monitor your blood pressure, cholesterol, and blood sugar levels.  -LIVER LESION, LIKELY HEMANGIOMA: A liver lesion, likely a hemangioma, is a benign (non-cancerous) mass in the liver. We will order an MRI to further evaluate this lesion.  -SCIATICA: Sciatica is pain that radiates along the path of the sciatic nerve, which extends from your lower back through your hips and down each leg. We will prescribe a short course of steroids to reduce inflammation and ibuprofen  for pain management. Walking is encouraged to strengthen your lower back muscles.  -VITAMIN D  DEFICIENCY: Vitamin D  deficiency means your body is not getting enough vitamin D , which is important for bone health and muscle function. We will prescribe a high-dose vitamin D  supplement to be taken once a week for 12 weeks.  -HYPERCHOLESTEROLEMIA: Hypercholesterolemia means you have high levels of cholesterol in your blood, which can increase your risk of heart disease. We recommend dietary changes to reduce cholesterol levels, focusing on whole foods and reducing processed foods.  INSTRUCTIONS:  Please schedule a follow-up appointment in a few weeks to assess your progress and review the MRI results. If your symptoms persist or worsen, we may refer you to an  orthopedic specialist.

## 2024-05-21 NOTE — Telephone Encounter (Signed)
 Pt calling stating she missed a call from the clinic. Please call back.

## 2024-05-21 NOTE — Telephone Encounter (Signed)
 Copied from CRM [id number]. Topic: Appointments - Scheduling Inquiry for Clinic >> May 21, 2024  7:39 AM Tiffini S wrote: Reason for CRM: Patient called to schedule a hospital follow up within two days from discharge date 05/21/24- pcp next available appointment is 06/15/24/ offered the patient at available appointment on 05/21/24 with Raguel Blush- patient refused. Patient will need medication and to be seen due to emergency.  Called [facility] twice, no answer.  Please call the patient to schedule at [phone].

## 2024-05-21 NOTE — Progress Notes (Signed)
 Established Patient Office Visit  Subjective   Patient ID: Michaela Moran, female    DOB: Jan 31, 1981  Age: 43 y.o. MRN: [id number]  Chief Complaint  Patient presents with   Patient Education    Patient was seen at [facility] recently. She was informed of fatty liver she wanted more clarification on what that means    Discussed the use of AI scribe software for clinical note transcription with the patient, who gave verbal consent to proceed.  History of Present Illness   Michaela Moran is a 43 year old female who presents with left-sided abdominal pain and concerns about liver health.  She visited the emergency department the previous day due to left-sided abdominal pain. A CT scan of the abdomen revealed mild steatosis and a liver lesion. Liver enzymes and kidney function tests are normal. The pain is significant on the left side and radiates down her leg. She takes a baby aspirin once daily.  She experiences back pain, hand pain, and headaches. She has sciatica-like pain, previously managed with a muscle relaxer. There are no recent injuries reported.  She has chronic vitamin D  deficiency and has not been taking vitamin D  supplements. Her diet primarily consists of carbohydrates such as rice, lamb, and spaghetti. She is working on weight loss to improve her liver health.   Results RADIOLOGY Abdominal CT: Mild hepatic steatosis, liver lesion likely flash filling hemangioma (05/20/2024)     Past Medical History:  Diagnosis Date   Acid reflux    Female circumcision    Hemorrhoid    History of positive PPD    neg CXR   Hypercholesteremia    Previous cesarean delivery, antepartum condition or complication 04/28/2016   History of C/S x 2     S/P repeat low transverse C-section 06/27/2016   Social History   Socioeconomic History   Marital status: Single    Spouse name: Not on file   Number of children: Not on file   Years of education: Not on file   Highest education level: Not on file   Occupational History   Not on file  Tobacco Use   Smoking status: Never   Smokeless tobacco: Never  Vaping Use   Vaping status: Never Used  Substance and Sexual Activity   Alcohol use: No   Drug use: No   Sexual activity: Yes    Birth control/protection: None  Other Topics Concern   Not on file  Social History Narrative   Not on file   Social Drivers of Health   Financial Resource Strain: Low Risk  (10/03/2023)   Overall Financial Resource Strain (CARDIA)    Difficulty of Paying Living Expenses: Not hard at all  Food Insecurity: No Food Insecurity (10/03/2023)   Hunger Vital Sign    Worried About Running Out of Food in the Last Year: Never true    Ran Out of Food in the Last Year: Never true  Transportation Needs: No Transportation Needs (10/03/2023)   PRAPARE - Administrator, Civil Service (Medical): No    Lack of Transportation (Non-Medical): No  Physical Activity: Unknown (10/03/2023)   Exercise Vital Sign    Days of Exercise per Week: Patient declined    Minutes of Exercise per Session: 20 min  Stress: No Stress Concern Present (10/03/2023)   [employer] of Occupational Health - Occupational Stress Questionnaire    Feeling of Stress : Not at all  Social Connections: Socially Integrated (10/03/2023)   Social Connection  and Isolation Panel    Frequency of Communication with Friends and Family: Three times a week    Frequency of Social Gatherings with Friends and Family: Twice a week    Attends Religious Services: More than 4 times per year    Active Member of [employer] or Organizations: Yes    Attends Banker Meetings: 1 to 4 times per year    Marital Status: Married  Catering manager Violence: Not At Risk (10/03/2023)   Humiliation, Afraid, Rape, and Kick questionnaire    Fear of Current or Ex-Partner: No    Emotionally Abused: No    Physically Abused: No    Sexually Abused: No   Family History  Problem Relation Age of Onset   Diabetes  Mother    Diabetes Sister    Diabetes Brother    Hypertension Brother    Diabetes Sister    Diabetes Sister    Autism Son    Healthy Son    Healthy Daughter    Other Neg Hx    Allergies  Allergen Reactions   Other Swelling    Allergic to peas.    Review of Systems  Constitutional: Negative.   HENT: Negative.    Eyes: Negative.   Respiratory:  Negative for shortness of breath.   Cardiovascular:  Negative for chest pain.  Gastrointestinal:  Negative for abdominal pain, nausea and vomiting.  Genitourinary:  Positive for flank pain. Negative for dysuria and frequency.  Musculoskeletal:  Positive for back pain and myalgias.  Skin: Negative.   Neurological: Negative.   Endo/Heme/Allergies: Negative.   Psychiatric/Behavioral: Negative.        Objective:     BP 107/77 (BP Location: Left Arm, Patient Position: Sitting, Cuff Size: Large)   Pulse 73   Ht 5' 6 (1.676 m)   Wt 195 lb (88.5 kg)   LMP 05/18/2024   SpO2 96%   BMI 31.47 kg/m  BP Readings from Last 3 Encounters:  05/21/24 107/77  05/21/24 94/73  03/18/24 102/71   Wt Readings from Last 3 Encounters:  05/21/24 195 lb (88.5 kg)  05/20/24 195 lb (88.5 kg)  03/18/24 202 lb 12.8 oz (92 kg)    Physical Exam Vitals and nursing note reviewed.  Constitutional:      Appearance: Normal appearance.  HENT:     Head: Normocephalic and atraumatic.     Right Ear: External ear normal.     Left Ear: External ear normal.     Nose: Nose normal.     Mouth/Throat:     Mouth: Mucous membranes are moist.     Pharynx: Oropharynx is clear.  Eyes:     Extraocular Movements: Extraocular movements intact.     Conjunctiva/sclera: Conjunctivae normal.     Pupils: Pupils are equal, round, and reactive to light.  Cardiovascular:     Rate and Rhythm: Normal rate and regular rhythm.     Pulses: Normal pulses.     Heart sounds: Normal heart sounds.  Pulmonary:     Effort: Pulmonary effort is normal.     Breath sounds: Normal  breath sounds.  Abdominal:     Tenderness: There is no abdominal tenderness. There is no right CVA tenderness or left CVA tenderness.  Musculoskeletal:     Cervical back: Normal, normal range of motion and neck supple.     Thoracic back: Normal.     Lumbar back: No tenderness. Decreased range of motion.     Comments: Pain elicited with ROM testing  Skin:    General: Skin is warm and dry.  Neurological:     General: No focal deficit present.     Mental Status: She is alert and oriented to person, place, and time.  Psychiatric:        Mood and Affect: Mood normal.        Behavior: Behavior normal.        Thought Content: Thought content normal.        Judgment: Judgment normal.        Assessment & Plan:   Problem List Items Addressed This Visit   None Visit Diagnoses       Acute left-sided low back pain with left-sided sciatica    -  Primary   Relevant Medications   methylPREDNISolone  (MEDROL  DOSEPAK) 4 MG TBPK tablet   ibuprofen  (ADVIL ) 600 MG tablet     Vitamin D  deficiency       Relevant Medications   Vitamin D , Ergocalciferol , (DRISDOL ) 1.25 MG (50000 UNIT) CAPS capsule     Liver disease       Relevant Orders   MR LIVER W WO CONTRAST     Fatty liver         Liver lesion          Assessment and Plan Fatty liver (hepatic steatosis) Mild hepatic steatosis on CT with normal liver enzymes. Discussed potential progression to cirrhosis if unmanaged and emphasized lifestyle modifications. - Advise weight loss of approximately 10% of body weight. - Recommend dietary modifications focusing on reducing carbohydrate intake and consuming whole foods. - Encourage regular physical activity, such as walking. - Advise monitoring of blood pressure, cholesterol, and blood sugar levels.  Liver lesion, likely hemangioma (pending MRI) Liver lesion likely a flash filling hemangioma per CT results.  MRI recommended for further evaluation. - Order MRI of the liver to further evaluate  the lesion.  Sciatica Left-sided pain radiating down the leg, consistent with sciatica. Pain likely due to inflammation pressing on the nerve. Discussed the role of vitamin D  deficiency in musculoskeletal pain. - Prescribe a short course of steroid taper to reduce inflammation. - Prescribe ibuprofen  for pain management. - Encourage walking to strengthen lower back muscles.  Vitamin D  deficiency Chronic vitamin D  deficiency contributing to musculoskeletal pain. She has not been taking prescribed vitamin D  supplements. - Prescribe prescription-strength vitamin D  to be taken once a week for 12 weeks.   I have reviewed the patient's medical history (PMH, PSH, Social History, Family History, Medications, and allergies) , and have been updated if relevant. I spent 30 minutes reviewing chart and  face to face time with patient.   Return in about 3 weeks (around 06/11/2024) for With [facility].    Kirk RAMAN Mayers, PA-C

## 2024-05-21 NOTE — Discharge Instructions (Addendum)
 Please take your results to your primary care provider for follow-up. Return to the ER for worsening or concerning symptoms. Recommend taking a 81mg  Aspirin daily.

## 2024-05-21 NOTE — Telephone Encounter (Signed)
 Good Morning. Called patient, patient did not answer left voicemail for patient to call back.

## 2024-05-21 NOTE — Telephone Encounter (Signed)
 Pt calling to schedule hospital f/u, pt states that she was told to call her doctor and to be seen today or tomorrow. Pt would like appt today before 1300.   See triage note from 9/4.   Copied from CRM #[id number]. Topic: Clinical - Red Word Triage >> May 21, 2024  8:33 AM Zy'onna H wrote: [facility] that prompted transfer to Nurse Triage: Patient is experiencing the following symptoms:  Patient went to the ER yesterday at 3 am regarding the pain and they advised her to call her Doctor the following morning.   Left side of leg is not working Pain   Warm Transferring to NT

## 2024-05-21 NOTE — Telephone Encounter (Addendum)
 Patient is going to [facility] today.

## 2024-05-22 ENCOUNTER — Other Ambulatory Visit: Payer: Self-pay

## 2024-05-29 ENCOUNTER — Ambulatory Visit (HOSPITAL_COMMUNITY)
Admission: RE | Admit: 2024-05-29 | Discharge: 2024-05-29 | Disposition: A | Discharge status: Home / Self Care | Payer: Self-pay | Source: Ambulatory Visit | Attending: Physician Assistant | Admitting: Physician Assistant

## 2024-05-29 DIAGNOSIS — K769 Liver disease, unspecified: Secondary | ICD-10-CM

## 2024-05-30 ENCOUNTER — Encounter (HOSPITAL_COMMUNITY): Payer: Self-pay

## 2024-05-30 ENCOUNTER — Ambulatory Visit (HOSPITAL_COMMUNITY): Payer: Self-pay

## 2024-06-01 ENCOUNTER — Telehealth: Payer: Self-pay

## 2024-06-01 ENCOUNTER — Ambulatory Visit (HOSPITAL_COMMUNITY)
Admission: RE | Admit: 2024-06-01 | Discharge: 2024-06-01 | Disposition: A | Discharge status: Home / Self Care | Payer: Self-pay | Source: Ambulatory Visit | Attending: Physician Assistant | Admitting: Physician Assistant

## 2024-06-01 DIAGNOSIS — K769 Liver disease, unspecified: Secondary | ICD-10-CM | POA: Insufficient documentation

## 2024-06-01 MED ORDER — GADOBUTROL 1 MMOL/ML IV SOLN
9.0000 mL | Freq: Once | INTRAVENOUS | Status: AC | PRN
  Administered 2024-06-01: 9 mL via INTRAVENOUS

## 2024-06-01 NOTE — Telephone Encounter (Signed)
 Copied from CRM [id number]. Topic: Clinical - Lab/Test Results >> Jun 01, 2024  3:46 PM Pinkey ORN wrote: Reason for CRM: MRI Results

## 2024-06-02 ENCOUNTER — Ambulatory Visit: Payer: Self-pay | Admitting: Family Medicine

## 2024-06-02 NOTE — Telephone Encounter (Signed)
 MyChart message has been sent to the patient with results.

## 2024-06-03 ENCOUNTER — Telehealth: Payer: Self-pay | Admitting: Nurse Practitioner

## 2024-06-03 NOTE — Telephone Encounter (Signed)
 Per pcp patient advised of the following: The MRI ordered by the mobile unit reveals presence of fatty deposits on your liver, which can be decreased by adhering to a low-cholesterol diet and exercise, it also shows a slightly enlarged liver. There is also a slightly enlarged blood vessel in the liver (which can be monitored and nothing additional needs to be done for this) but no concerning liver lesions. Patient had no additional questions

## 2024-06-03 NOTE — Telephone Encounter (Signed)
Call to patient. VM left.

## 2024-06-03 NOTE — Telephone Encounter (Signed)
 The patient called in returning a call to [facility]. I transferred the call to [facility] for further assistance.

## 2024-06-03 NOTE — Telephone Encounter (Signed)
 Copied from CRM [id number]. Topic: Clinical - Lab/Test Results >> Jun 02, 2024  9:47 AM Leonette SQUIBB wrote: Reason for CRM: pt would like a nurse to call her back regarding some lab results.  [phone] >> Jun 02, 2024  4:41 PM DeAngela L wrote: Patient calling to get MRI results would like a nurse to call her back  Patient nervous about results   Pt num  [phone] (M)

## 2024-06-04 NOTE — Telephone Encounter (Signed)
 noted

## 2024-06-17 ENCOUNTER — Telehealth: Payer: Self-pay | Admitting: Nurse Practitioner

## 2024-06-17 NOTE — Telephone Encounter (Signed)
 Pt unconfirmed appt 10/1 (per vr )

## 2024-06-19 ENCOUNTER — Other Ambulatory Visit: Payer: Self-pay

## 2024-06-19 ENCOUNTER — Ambulatory Visit: Payer: Self-pay | Attending: Nurse Practitioner | Admitting: Nurse Practitioner

## 2024-06-19 ENCOUNTER — Encounter: Payer: Self-pay | Admitting: Nurse Practitioner

## 2024-06-19 VITALS — BP 111/75 | HR 83 | Resp 19 | Ht 66.0 in | Wt 204.8 lb

## 2024-06-19 DIAGNOSIS — Z79899 Other long term (current) drug therapy: Secondary | ICD-10-CM | POA: Insufficient documentation

## 2024-06-19 DIAGNOSIS — E663 Overweight: Secondary | ICD-10-CM | POA: Insufficient documentation

## 2024-06-19 DIAGNOSIS — K760 Fatty (change of) liver, not elsewhere classified: Secondary | ICD-10-CM

## 2024-06-19 DIAGNOSIS — Z1211 Encounter for screening for malignant neoplasm of colon: Secondary | ICD-10-CM | POA: Insufficient documentation

## 2024-06-19 DIAGNOSIS — G4700 Insomnia, unspecified: Secondary | ICD-10-CM | POA: Insufficient documentation

## 2024-06-19 DIAGNOSIS — R519 Headache, unspecified: Secondary | ICD-10-CM | POA: Insufficient documentation

## 2024-06-19 DIAGNOSIS — Z6833 Body mass index (BMI) 33.0-33.9, adult: Secondary | ICD-10-CM | POA: Insufficient documentation

## 2024-06-19 DIAGNOSIS — G479 Sleep disorder, unspecified: Secondary | ICD-10-CM | POA: Insufficient documentation

## 2024-06-19 DIAGNOSIS — Z23 Encounter for immunization: Secondary | ICD-10-CM

## 2024-06-19 DIAGNOSIS — J302 Other seasonal allergic rhinitis: Secondary | ICD-10-CM

## 2024-06-19 MED ORDER — LORATADINE 10 MG PO TABS
10.0000 mg | ORAL_TABLET | Freq: Every day | ORAL | 1 refills | Status: AC
  Filled 2024-06-19: qty 90, 90d supply, fill #0

## 2024-06-19 NOTE — Patient Instructions (Signed)
 What are tips for following this plan? General guidelines If you are overweight, work with your health care provider to lose weight safely. Losing just 5-10% of your body weight can improve your overall health and help prevent diseases such as diabetes and heart disease. Avoid: Foods with added sugar. Fried foods. Foods that contain partially hydrogenated oils, including stick margarine, some tub margarines, cookies, crackers, and other baked goods. If you drink alcohol: Limit how much you have to: 0-1 drink a day for women who are not pregnant. 0-2 drinks a day for men. Know how much alcohol is in a drink. In the [place] one drink equals one 12 oz bottle of beer (355 mL), one 5 oz glass of wine (148 mL), or one 1 oz glass of hard liquor (44 mL). Reading food labels Check food labels for: Trans fats or partially hydrogenated oils. Avoid foods that contain these. High amounts of saturated fat. Choose foods that are low in saturated fat (less than 2 g). The amount of cholesterol in each serving. The amount of fiber in each serving. Choose foods with healthy fats, such as: Monounsaturated and polyunsaturated fats. These include olive and canola oil, flaxseeds, walnuts, almonds, and seeds. Omega-3 fats. These are found in foods such as salmon, mackerel, sardines, tuna, flaxseed oil, and ground flaxseeds. Choose grain products that have whole grains. Look for the word whole as the first word in the ingredient list. Cooking Cook foods using methods other than frying. Baking, boiling, grilling, and broiling are some healthy options. Eat more home-cooked food and less restaurant, buffet, and fast food. Avoid cooking using saturated fats. Animal sources of saturated fats include meats, butter, and cream. Plant sources of saturated fats include palm oil, palm kernel oil, and coconut oil. Meal planning  At meals, imagine dividing your plate into fourths: Fill one-half of your plate with  vegetables, green salads, and fruit. Fill one-fourth of your plate with whole grains. Fill one-fourth of your plate with lean protein foods. Eat fish that is high in omega-3 fats at least two times a week. Eat more foods that contain fiber, such as whole grains, beans, apples, pears, berries, broccoli, carrots, peas, and barley. These foods help promote healthy cholesterol levels in the blood. What foods should I eat? Fruits All fresh, canned (in natural juice), or frozen fruits. Vegetables Fresh or frozen vegetables (raw, steamed, roasted, or grilled). Green salads. Grains Whole grains, such as whole wheat or whole grain breads, crackers, cereals, and pasta. Unsweetened oatmeal, bulgur, barley, quinoa, or brown rice. Corn or whole wheat flour tortillas. Meats and other proteins Ground beef (85% or leaner), grass-fed beef, or beef trimmed of fat. Skinless chicken or [place]. Ground chicken or [place]. Pork trimmed of fat. All fish and seafood. Egg whites. Dried beans, peas, or lentils. Unsalted nuts or seeds. Unsalted canned beans. Natural nut butters without added sugar and oil. Dairy Low-fat or nonfat dairy products, such as skim or 1% milk, 2% or reduced-fat cheeses, low-fat and fat-free ricotta or cottage cheese, or plain low-fat and nonfat yogurt. Fats and oils Tub margarine without trans fats. Light or reduced-fat mayonnaise and salad dressings. Avocado. Olive, canola, sesame, or safflower oils. The items listed above may not be a complete list of foods and beverages you can eat. Contact a dietitian for more information. What foods should I avoid? Fruits Canned fruit in heavy syrup. Fruit in cream or butter sauce. Fried fruit. Vegetables Vegetables cooked in cheese, cream, or butter sauce. Fried vegetables. Grains  White bread. White pasta. White rice. Cornbread. Bagels, pastries, and croissants. Crackers and snack foods that contain trans fat and hydrogenated oils. Meats and other  proteins Fatty cuts of meat. Ribs, chicken wings, bacon, sausage, bologna, salami, chitterlings, fatback, hot dogs, bratwurst, and packaged lunch meats. Liver and organ meats. Whole eggs and egg yolks. Chicken and [place] with skin. Fried meat. Dairy Whole or 2% milk, cream, half-and-half, and cream cheese. Whole milk cheeses. Whole-fat or sweetened yogurt. Full-fat cheeses. Nondairy creamers and whipped toppings. Processed cheese, cheese spreads, and cheese curds. Fats and oils Butter, stick margarine, lard, shortening, ghee, or bacon fat. Coconut, palm kernel, and palm oils. Beverages Alcohol. Sugar-sweetened drinks such as sodas, lemonade, and fruit drinks. Sweets and desserts Corn syrup, sugars, honey, and molasses. Candy. Jam and jelly. Syrup. Sweetened cereals. Cookies, pies, cakes, donuts, muffins, and ice cream. The items listed above may not be a complete list of foods and beverages you should avoid. Contact a dietitian for more information. Summary Your body needs fat and cholesterol for basic functions. However, eating too much of these things can be harmful to your health. Work with your health care provider and dietitian to follow a diet that limits fat and cholesterol. Doing this may help lower your risk for heart disease and other conditions. Choose healthy fats, such as monounsaturated and polyunsaturated fats, and foods high in omega-3 fatty acids. Eat fiber-rich foods, such as whole grains, beans, peas, fruits, and vegetables. Limit or avoid alcohol, fried foods, and foods high in saturated fats, partially hydrogenated oils, and sugar.

## 2024-06-19 NOTE — Progress Notes (Signed)
 Assessment & Plan:  Michaela Moran was seen today for follow-up.  Diagnoses and all orders for this visit:  Seasonal allergies -     loratadine  (CLARITIN ) 10 MG tablet; Take 1 tablet (10 mg total) by mouth daily. Experiencing allergy  symptoms including headache. Using nasal spray but not oral medication. - Prescribed Claritin  or Zyrtec  for symptom management. - Advised morning intake to avoid drowsiness.  Need for influenza vaccination -     Flu vaccine trivalent PF, 6mos and older(Flulaval,Afluria,Fluarix,Fluzone)  Colon cancer screening -     Ambulatory referral to Gastroenterology  Fatty liver disease, nonalcoholic Fatty liver confirmed on MRI, likely due to diet and inactivity. Reassured her that fatty liver does not lead to liver failure or cancer. - Provided educational materials on healthy diet. - Advised regular exercise: 45 minutes of walking, 3-4 days weekly. - Discussed dietary recommendations: increase fruits, vegetables, baked chicken, baked fish, lamb; avoid red meat. - Encouraged gradual lifestyle changes without stress.   Patient has been counseled on age-appropriate routine health concerns for screening and prevention. These are reviewed and up-to-date. Referrals have been placed accordingly. Immunizations are up-to-date or declined.    Subjective:   Chief Complaint  Patient presents with   Follow-up    Michaela Moran 43 y.o. female presents to office today seasonal allergies and concerns of fatty liver disease  [name] has been experiencing runny nose, itchy and watery eyes. She is requesting antihistamine.   We discussed her recent MRi results.   .She is concerned about a recent MRI that showed fatty liver. She exercises frequently but not daily and is confused about her condition. She acknowledges being overweight and identifies stress related to her son as a barrier to regular exercise. She recalls a previous communication indicating her liver lab test was okay, but  now understands that while her liver enzymes were normal, the MRI showed fatty liver. She is worried about the implications of fatty liver, including potential progression to liver failure or cancer, based on non-medical advice she received from others. She is interested in dietary changes to address her liver condition, asking about healthy foods such as fruits, vegetables, and lean meats. She mentions consuming honey as a natural sweetener and inquires about the healthiness of papaya and brown rice.  She has been experiencing sleep disturbances, partly due to her son who is special needs and her own stress. She reports not working for the past two months but plans to return to work next week, which she believes will help her establish a routine for exercise.  She mentions having allergies that cause headaches and currently uses a nasal spray for relief. She does not take any oral allergy  medications but is open to starting Claritin  or Zyrtec .  Review of Systems  Constitutional:  Negative for fever, malaise/fatigue and weight loss.  HENT: Negative.  Negative for nosebleeds.   Eyes: Negative.  Negative for blurred vision, double vision and photophobia.  Respiratory: Negative.  Negative for cough and shortness of breath.   Cardiovascular: Negative.  Negative for chest pain, palpitations and leg swelling.  Gastrointestinal: Negative.  Negative for heartburn, nausea and vomiting.  Musculoskeletal: Negative.  Negative for myalgias.  Neurological: Negative.  Negative for dizziness, focal weakness, seizures and headaches.  Endo/Heme/Allergies:  Positive for environmental allergies.  Psychiatric/Behavioral: Negative.  Negative for suicidal ideas.     Past Medical History:  Diagnosis Date   Acid reflux    Female circumcision    Hemorrhoid  History of positive PPD    neg CXR   Hypercholesteremia    Previous cesarean delivery, antepartum condition or complication 04/28/2016   History of C/S x 2      S/P repeat low transverse C-section 06/27/2016    Past Surgical History:  Procedure Laterality Date   ARTERY BIOPSY Right 02/17/2021   Procedure: RIGHT TEMPORAL ARTERY BIOPSY;  Surgeon: Eliza Lonni RAMAN, MD;  Location: [facility];  Service: Vascular;  Laterality: Right;   CESAREAN SECTION     CESAREAN SECTION N/A 01/02/2013   Procedure: CESAREAN SECTION;  Surgeon: Glenys RAMAN Birk, MD;  Location: [facility] ORS;  Service: Obstetrics;  Laterality: N/A;  Incidental Cyystotomy   CESAREAN SECTION N/A 06/26/2016   Procedure: CESAREAN SECTION;  Surgeon: Bebe Furry, MD;  Location: [facility] B[facility]TH[facility] SUITES;  Service: Obstetrics;  Laterality: N/A;   CYSTOTOMY     with repair at 2014 repeat c-section    Family History  Problem Relation Age of Onset   Diabetes Mother    Diabetes Sister    Diabetes Brother    Hypertension Brother    Diabetes Sister    Diabetes Sister    Autism Son    Healthy Son    Healthy Daughter    Other Neg Hx     Social History Reviewed with no changes to be made today.   Outpatient Medications Prior to Visit  Medication Sig Dispense Refill   Azelastine  HCl 137 MCG/SPRAY SOLN Place 2 sprays into both nostrils daily. 30 mL 6   cyclobenzaprine  (FLEXERIL ) 5 MG tablet Take 1 tablet (5 mg total) by mouth 3 (three) times daily as needed for muscle spasms. 30 tablet 3   fluticasone  (FLONASE ) 50 MCG/ACT nasal spray Place 2 sprays into both nostrils daily. 16 g 6   gabapentin  (NEURONTIN ) 300 MG capsule Take 1 capsule (300 mg total) by mouth 3 (three) times daily. FOR PAIN 90 capsule 3   pantoprazole  (PROTONIX ) 40 MG tablet Take 1 tablet (40 mg total) by mouth daily. FOR ACID 90 tablet 1   Vitamin D , Ergocalciferol , (DRISDOL ) 1.25 MG (50000 UNIT) CAPS capsule Take 1 capsule (50,000 Units total) by mouth every 7 (seven) days. 4 capsule 2   albuterol  (VENTOLIN  HFA) 108 (90 Base) MCG/ACT inhaler Inhale 1-2 puffs into the lungs every 6 (six) hours as needed for wheezing or shortness of  breath. (Patient not taking: Reported on 06/19/2024) 6.7 g 0   clindamycin  (CLINDAGEL ) 1 % gel 1 Application 2 (two) times daily. (Patient not taking: Reported on 06/19/2024) 30 g 0   clotrimazole  (LOTRIMIN ) 1 % cream Apply 1 Application topically 2 (two) times daily. For vaginal itching (Patient not taking: Reported on 06/19/2024) 30 g 1   ibuprofen  (ADVIL ) 600 MG tablet Take 1 tablet (600 mg total) by mouth every 8 (eight) hours as needed. (Patient not taking: Reported on 06/19/2024) 30 tablet 0   methylPREDNISolone  (MEDROL  DOSEPAK) 4 MG TBPK tablet Use per instructions on package (Patient not taking: Reported on 06/19/2024) 21 tablet 0   No facility-administered medications prior to visit.    Allergies  Allergen Reactions   Other Swelling    Allergic to peas.       Objective:    BP 111/75 (BP Location: Left Arm, Patient Position: Sitting, Cuff Size: Normal)   Pulse 83   Resp 19   Ht 5' 6 (1.676 m)   Wt 204 lb 12.8 oz (92.9 kg)   LMP 05/18/2024   SpO2 98%   BMI 33.06  kg/m  Wt Readings from Last 3 Encounters:  06/19/24 204 lb 12.8 oz (92.9 kg)  05/21/24 195 lb (88.5 kg)  05/20/24 195 lb (88.5 kg)    Physical Exam Vitals and nursing note reviewed.  Constitutional:      Appearance: She is well-developed. She is obese.  HENT:     Head: Normocephalic and atraumatic.  Cardiovascular:     Rate and Rhythm: Normal rate and regular rhythm.     Heart sounds: Normal heart sounds. No murmur heard.    No friction rub. No gallop.  Pulmonary:     Effort: Pulmonary effort is normal. No tachypnea or respiratory distress.     Breath sounds: Normal breath sounds. No decreased breath sounds, wheezing, rhonchi or rales.  Chest:     Chest wall: No tenderness.  Abdominal:     Palpations: Abdomen is soft.  Musculoskeletal:        General: Normal range of motion.     Cervical back: Normal range of motion.  Skin:    General: Skin is warm and dry.  Neurological:     Mental Status: She is  alert and oriented to person, place, and time.     Coordination: Coordination normal.  Psychiatric:        Behavior: Behavior normal. Behavior is cooperative.        Thought Content: Thought content normal.        Judgment: Judgment normal.          Patient has been counseled extensively about nutrition and exercise as well as the importance of adherence with medications and regular follow-up. The patient was given clear instructions to go to ER or return to medical center if symptoms don't improve, worsen or new problems develop. The patient verbalized understanding.   Follow-up: Return if symptoms worsen or fail to improve.   Haze LELON Servant, FNP-BC [facility] [place], [place] [phone]   07/12/2024, 9:13 PM

## 2024-07-12 ENCOUNTER — Encounter: Payer: Self-pay | Admitting: Nurse Practitioner

## 2024-08-31 ENCOUNTER — Encounter: Payer: Self-pay | Admitting: Nurse Practitioner

## 2024-08-31 DIAGNOSIS — Z1231 Encounter for screening mammogram for malignant neoplasm of breast: Secondary | ICD-10-CM

## 2024-09-16 ENCOUNTER — Telehealth: Payer: Self-pay | Admitting: Nurse Practitioner

## 2024-09-16 NOTE — Telephone Encounter (Signed)
 Pt confirmed appt

## 2024-09-18 ENCOUNTER — Other Ambulatory Visit: Payer: Self-pay

## 2024-09-18 ENCOUNTER — Ambulatory Visit: Payer: Self-pay | Attending: Nurse Practitioner | Admitting: Nurse Practitioner

## 2024-09-18 ENCOUNTER — Encounter: Payer: Self-pay | Admitting: Nurse Practitioner

## 2024-09-18 VITALS — BP 114/79 | HR 74 | Ht 66.0 in | Wt 201.8 lb

## 2024-09-18 DIAGNOSIS — E7800 Pure hypercholesterolemia, unspecified: Secondary | ICD-10-CM

## 2024-09-18 DIAGNOSIS — R7303 Prediabetes: Secondary | ICD-10-CM

## 2024-09-18 DIAGNOSIS — M2550 Pain in unspecified joint: Secondary | ICD-10-CM | POA: Diagnosis not present

## 2024-09-18 DIAGNOSIS — R21 Rash and other nonspecific skin eruption: Secondary | ICD-10-CM | POA: Diagnosis not present

## 2024-09-18 DIAGNOSIS — H538 Other visual disturbances: Secondary | ICD-10-CM | POA: Diagnosis not present

## 2024-09-18 DIAGNOSIS — E559 Vitamin D deficiency, unspecified: Secondary | ICD-10-CM

## 2024-09-18 DIAGNOSIS — L719 Rosacea, unspecified: Secondary | ICD-10-CM | POA: Diagnosis not present

## 2024-09-18 MED ORDER — IBUPROFEN 600 MG PO TABS
600.0000 mg | ORAL_TABLET | Freq: Three times a day (TID) | ORAL | 0 refills | Status: AC | PRN
  Filled 2024-09-18: qty 60, 20d supply, fill #0

## 2024-09-18 MED ORDER — TRETINOIN 0.025 % EX CREA
TOPICAL_CREAM | Freq: Every day | CUTANEOUS | 0 refills | Status: AC
  Filled 2024-09-18: qty 45, fill #0
  Filled 2024-09-25 – 2024-09-29 (×2): qty 20, 30d supply, fill #0

## 2024-09-18 MED ORDER — VITAMIN D (ERGOCALCIFEROL) 1.25 MG (50000 UNIT) PO CAPS
50000.0000 [IU] | ORAL_CAPSULE | ORAL | 0 refills | Status: AC
  Filled 2024-09-18: qty 12, 84d supply, fill #0

## 2024-09-18 NOTE — Progress Notes (Signed)
 "  Assessment & Plan:  Shuntel was seen today for medical management of chronic issues and muscle pain.  Diagnoses and all orders for this visit:  Facial rash RETIN  A 0.025% apply at bedtime -     Ambulatory referral to Dermatology  Rosacea, acne -     Cancel: Ambulatory referral to Dermatology -     Ambulatory referral to Dermatology  Prediabetes -     Hemoglobin A1c -     CMP14+EGFR -     Urine Albumin/Creatinine with ratio (send out) [LAB689]  Blurred vision, bilateral -     Ambulatory referral to Ophthalmology  Hypercholesterolemia -     Lipid panel INSTRUCTIONS: Work on a low fat, heart healthy diet and participate in regular aerobic exercise program by working out at least 150 minutes per week; 5 days a week-30 minutes per day. Avoid red meat/beef/steak,  fried foods. junk foods, sodas, sugary drinks, unhealthy snacking, alcohol and smoking.  Drink at least 80 oz of water  per day and monitor your carbohydrate intake daily.    Vitamin D  deficiency disease -     VITAMIN D  25 Hydroxy (Vit-D Deficiency, Fractures)  Multiple joint arthralgias Advil  600 mg every 8 hours prn  Patient has been counseled on age-appropriate routine health concerns for screening and prevention. These are reviewed and up-to-date. Referrals have been placed accordingly. Immunizations are up-to-date or declined.    Subjective:   Chief Complaint  Patient presents with   Medical Management of Chronic Issues   Muscle Pain    Muscle pain upon inhaling as well as lower left leg muscle pain.    Michaela Moran 44 y.o. female presents to office today requesting refill of acne cream and with concerns of joint pain.  She experiences joint pain in her legs and chest whenever she is exposed to cold temperatures. Wants to know what she can take for this.   She has a history of adult acne and facial rash. Treatments tried include clindagel , benzaclin and retin -a. She is requesting a refill of retin a and is aware  that she is child bearing age and the risk of birth defects if she were to become pregnant while taking retin a.   Prediabetes A1c at goal with lifestyle modifications. She is concerned that she has not been able to lose weight despite her walking more and making dietary changes.  Lab Results  Component Value Date   HGBA1C 5.7 (H) 03/18/2024     Review of Systems  Constitutional:  Negative for fever, malaise/fatigue and weight loss.  HENT: Negative.  Negative for nosebleeds.   Eyes: Negative.  Negative for blurred vision, double vision and photophobia.  Respiratory: Negative.  Negative for cough and shortness of breath.   Cardiovascular: Negative.  Negative for chest pain, palpitations and leg swelling.  Gastrointestinal: Negative.  Negative for heartburn, nausea and vomiting.  Musculoskeletal:  Positive for joint pain and myalgias.  Skin:  Positive for rash.  Neurological: Negative.  Negative for dizziness, focal weakness, seizures and headaches.  Psychiatric/Behavioral: Negative.  Negative for suicidal ideas.     Past Medical History:  Diagnosis Date   Acid reflux    Female circumcision    Hemorrhoid    History of positive PPD    neg CXR   Hypercholesteremia    Previous cesarean delivery, antepartum condition or complication 04/28/2016   History of C/S x 2     S/P repeat low transverse C-section 06/27/2016    Past  Surgical History:  Procedure Laterality Date   ARTERY BIOPSY Right 02/17/2021   Procedure: RIGHT TEMPORAL ARTERY BIOPSY;  Surgeon: Eliza Lonni RAMAN, MD;  Location: [facility] OR;  Service: Vascular;  Laterality: Right;   CESAREAN SECTION     CESAREAN SECTION N/A 01/02/2013   Procedure: CESAREAN SECTION;  Surgeon: Glenys RAMAN Birk, MD;  Location: [facility] ORS;  Service: Obstetrics;  Laterality: N/A;  Incidental Cyystotomy   CESAREAN SECTION N/A 06/26/2016   Procedure: CESAREAN SECTION;  Surgeon: Bebe Furry, MD;  Location: [facility] BIRTHING SUITES;  Service: Obstetrics;   Laterality: N/A;   CYSTOTOMY     with repair at 2014 repeat c-section    Family History  Problem Relation Age of Onset   Diabetes Mother    Diabetes Sister    Diabetes Brother    Hypertension Brother    Diabetes Sister    Diabetes Sister    Autism Son    Healthy Son    Healthy Daughter    Other Neg Hx     Social History Reviewed with no changes to be made today.   Outpatient Medications Prior to Visit  Medication Sig Dispense Refill   albuterol  (VENTOLIN  HFA) 108 (90 Base) MCG/ACT inhaler Inhale 1-2 puffs into the lungs every 6 (six) hours as needed for wheezing or shortness of breath. 6.7 g 0   Azelastine  HCl 137 MCG/SPRAY SOLN Place 2 sprays into both nostrils daily. 30 mL 6   cyclobenzaprine  (FLEXERIL ) 5 MG tablet Take 1 tablet (5 mg total) by mouth 3 (three) times daily as needed for muscle spasms. 30 tablet 3   fluticasone  (FLONASE ) 50 MCG/ACT nasal spray Place 2 sprays into both nostrils daily. 16 g 6   pantoprazole  (PROTONIX ) 40 MG tablet Take 1 tablet (40 mg total) by mouth daily. FOR ACID 90 tablet 1   Vitamin D , Ergocalciferol , (DRISDOL ) 1.25 MG (50000 UNIT) CAPS capsule Take 1 capsule (50,000 Units total) by mouth every 7 (seven) days. 4 capsule 2   gabapentin  (NEURONTIN ) 300 MG capsule Take 1 capsule (300 mg total) by mouth 3 (three) times daily. FOR PAIN 90 capsule 3   loratadine  (CLARITIN ) 10 MG tablet Take 1 tablet (10 mg total) by mouth daily. 90 tablet 1   clindamycin  (CLINDAGEL ) 1 % gel 1 Application 2 (two) times daily. (Patient not taking: Reported on 09/18/2024) 30 g 0   clotrimazole  (LOTRIMIN ) 1 % cream Apply 1 Application topically 2 (two) times daily. For vaginal itching (Patient not taking: Reported on 09/18/2024) 30 g 1   ibuprofen  (ADVIL ) 600 MG tablet Take 1 tablet (600 mg total) by mouth every 8 (eight) hours as needed. (Patient not taking: Reported on 09/18/2024) 30 tablet 0   methylPREDNISolone  (MEDROL  DOSEPAK) 4 MG TBPK tablet Use per instructions on  package (Patient not taking: Reported on 09/18/2024) 21 tablet 0   No facility-administered medications prior to visit.    Allergies[1]     Objective:    BP 114/79 (BP Location: Left Arm, Patient Position: Sitting, Cuff Size: Normal)   Pulse 74   Ht 5' 6 (1.676 m)   Wt 201 lb 12.8 oz (91.5 kg)   SpO2 99%   BMI 32.57 kg/m  Wt Readings from Last 3 Encounters:  09/18/24 201 lb 12.8 oz (91.5 kg)  06/19/24 204 lb 12.8 oz (92.9 kg)  05/21/24 195 lb (88.5 kg)    Physical Exam Vitals and nursing note reviewed.  Constitutional:      Appearance: She is well-developed.  HENT:  Head: Normocephalic and atraumatic.  Cardiovascular:     Rate and Rhythm: Normal rate and regular rhythm.     Heart sounds: Normal heart sounds. No murmur heard.    No friction rub. No gallop.  Pulmonary:     Effort: Pulmonary effort is normal. No tachypnea or respiratory distress.     Breath sounds: Normal breath sounds. No decreased breath sounds, wheezing, rhonchi or rales.  Chest:     Chest wall: No tenderness.  Musculoskeletal:        General: Normal range of motion.     Cervical back: Normal range of motion.  Skin:    General: Skin is warm and dry.     Findings: Acne and rash present. Rash is macular.  Neurological:     Mental Status: She is alert and oriented to person, place, and time.     Coordination: Coordination normal.  Psychiatric:        Behavior: Behavior normal. Behavior is cooperative.        Thought Content: Thought content normal.        Judgment: Judgment normal.          Patient has been counseled extensively about nutrition and exercise as well as the importance of adherence with medications and regular follow-up. The patient was given clear instructions to go to ER or return to medical center if symptoms don't improve, worsen or new problems develop. The patient verbalized understanding.   Follow-up: Return in about 6 months (around 03/18/2025).   Haze LELON Servant,  FNP-BC [facility] [place], [place] [phone]   09/18/2024, 9:34 AM     [1]  Allergies Allergen Reactions   Other Swelling    Allergic to peas.   "

## 2024-09-18 NOTE — Patient Instructions (Signed)
 Contact  [facility] for Colonoscopy.  [address] [place], [place] [address] PH# [phone]

## 2024-09-19 LAB — CMP14+EGFR
ALT: 17 IU/L (ref 0–32)
AST: 16 IU/L (ref 0–40)
Albumin: 4.2 g/dL (ref 3.9–4.9)
Alkaline Phosphatase: 95 IU/L (ref 41–116)
BUN/Creatinine Ratio: 16 (ref 9–23)
BUN: 13 mg/dL (ref 6–24)
Bilirubin Total: 0.4 mg/dL (ref 0.0–1.2)
CO2: 23 mmol/L (ref 20–29)
Calcium: 9.4 mg/dL (ref 8.7–10.2)
Chloride: 101 mmol/L (ref 96–106)
Creatinine, Ser: 0.8 mg/dL (ref 0.57–1.00)
Globulin, Total: 2.8 g/dL (ref 1.5–4.5)
Glucose: 92 mg/dL (ref 70–99)
Potassium: 4.2 mmol/L (ref 3.5–5.2)
Sodium: 140 mmol/L (ref 134–144)
Total Protein: 7 g/dL (ref 6.0–8.5)
eGFR: 93 mL/min/1.73

## 2024-09-19 LAB — LIPID PANEL
Chol/HDL Ratio: 5 ratio — ABNORMAL HIGH (ref 0.0–4.4)
Cholesterol, Total: 274 mg/dL — ABNORMAL HIGH (ref 100–199)
HDL: 55 mg/dL
LDL Chol Calc (NIH): 208 mg/dL — ABNORMAL HIGH (ref 0–99)
Triglycerides: 70 mg/dL (ref 0–149)
VLDL Cholesterol Cal: 11 mg/dL (ref 5–40)

## 2024-09-19 LAB — MICROALBUMIN / CREATININE URINE RATIO
Creatinine, Urine: 203.2 mg/dL
Microalb/Creat Ratio: 13 mg/g{creat} (ref 0–29)
Microalbumin, Urine: 25.7 ug/mL

## 2024-09-19 LAB — VITAMIN D 25 HYDROXY (VIT D DEFICIENCY, FRACTURES): Vit D, 25-Hydroxy: 17 ng/mL — ABNORMAL LOW (ref 30.0–100.0)

## 2024-09-19 LAB — HEMOGLOBIN A1C
Est. average glucose Bld gHb Est-mCnc: 120 mg/dL
Hgb A1c MFr Bld: 5.8 % — ABNORMAL HIGH (ref 4.8–5.6)

## 2024-09-21 ENCOUNTER — Ambulatory Visit: Payer: Self-pay | Admitting: Nurse Practitioner

## 2024-09-21 DIAGNOSIS — E7800 Pure hypercholesterolemia, unspecified: Secondary | ICD-10-CM

## 2024-09-21 MED ORDER — ATORVASTATIN CALCIUM 20 MG PO TABS: 20.0000 mg | ORAL_TABLET | Freq: Every day | ORAL | 3 refills | Status: DC

## 2024-09-24 ENCOUNTER — Ambulatory Visit: Payer: Self-pay

## 2024-09-25 ENCOUNTER — Other Ambulatory Visit: Payer: Self-pay

## 2024-09-25 ENCOUNTER — Telehealth: Payer: Self-pay

## 2024-09-25 ENCOUNTER — Other Ambulatory Visit (HOSPITAL_BASED_OUTPATIENT_CLINIC_OR_DEPARTMENT_OTHER): Payer: Self-pay

## 2024-09-25 DIAGNOSIS — E7800 Pure hypercholesterolemia, unspecified: Secondary | ICD-10-CM

## 2024-09-25 MED ORDER — ATORVASTATIN CALCIUM 20 MG PO TABS
20.0000 mg | ORAL_TABLET | Freq: Every day | ORAL | 3 refills | Status: AC
  Filled 2024-09-29: qty 30, 30d supply, fill #0

## 2024-09-25 NOTE — Telephone Encounter (Signed)
 Patient has already picked up Vitamin D , and Ibprofen from [facility] 7 days ago. Retin-A  is not covered by insurance. Please send something that is covered if possible. Sending over Atorvastatin .

## 2024-09-25 NOTE — Telephone Encounter (Signed)
 Copied from CRM #[id number]. Topic: Clinical - Prescription Issue >> Sep 25, 2024  4:28 PM Avram MATSU wrote: Reason for CRM: patient would like all her medications to be sent to  [facility] [address]  Phone:[phone]   And she would need this medication switch as well atorvastatin  (LIPITOR) 20 MG tablet [[id number]]

## 2024-09-28 NOTE — Telephone Encounter (Signed)
 Please let her know retin a is not covered by her insurance. She will need to follow up with Dermatology as she reported the other cream prior to retin a was ineffective.

## 2024-09-29 ENCOUNTER — Ambulatory Visit: Payer: Self-pay

## 2024-09-29 ENCOUNTER — Encounter (HOSPITAL_BASED_OUTPATIENT_CLINIC_OR_DEPARTMENT_OTHER): Payer: Self-pay

## 2024-09-29 ENCOUNTER — Emergency Department (HOSPITAL_BASED_OUTPATIENT_CLINIC_OR_DEPARTMENT_OTHER)

## 2024-09-29 ENCOUNTER — Other Ambulatory Visit (HOSPITAL_BASED_OUTPATIENT_CLINIC_OR_DEPARTMENT_OTHER): Payer: Self-pay

## 2024-09-29 ENCOUNTER — Other Ambulatory Visit: Payer: Self-pay

## 2024-09-29 ENCOUNTER — Emergency Department (HOSPITAL_BASED_OUTPATIENT_CLINIC_OR_DEPARTMENT_OTHER)
Admission: EM | Admit: 2024-09-29 | Discharge: 2024-09-29 | Disposition: A | Discharge status: Home / Self Care | Attending: Emergency Medicine | Admitting: Emergency Medicine

## 2024-09-29 DIAGNOSIS — R079 Chest pain, unspecified: Secondary | ICD-10-CM | POA: Insufficient documentation

## 2024-09-29 LAB — BASIC METABOLIC PANEL WITH GFR
Anion gap: 12 (ref 5–15)
BUN: 11 mg/dL (ref 6–20)
CO2: 23 mmol/L (ref 22–32)
Calcium: 9 mg/dL (ref 8.9–10.3)
Chloride: 104 mmol/L (ref 98–111)
Creatinine, Ser: 0.85 mg/dL (ref 0.44–1.00)
GFR, Estimated: >60 mL/min
Glucose, Bld: 85 mg/dL (ref 70–99)
Potassium: 3.9 mmol/L (ref 3.5–5.1)
Sodium: 139 mmol/L (ref 135–145)

## 2024-09-29 LAB — CBC
HCT: 39.4 % (ref 36.0–46.0)
Hemoglobin: 13.4 g/dL (ref 12.0–15.0)
MCH: 30.6 pg (ref 26.0–34.0)
MCHC: 34 g/dL (ref 30.0–36.0)
MCV: 90 fL (ref 80.0–100.0)
Platelets: 310 K/uL (ref 150–400)
RBC: 4.38 MIL/uL (ref 3.87–5.11)
RDW: 12.9 % (ref 11.5–15.5)
WBC: 5.7 K/uL (ref 4.0–10.5)
nRBC: 0 % (ref 0.0–0.2)

## 2024-09-29 LAB — RESP PANEL BY RT-PCR (RSV, FLU A&B, COVID)  RVPGX2
Influenza A by PCR: NEGATIVE
Influenza B by PCR: NEGATIVE
Resp Syncytial Virus by PCR: NEGATIVE
SARS Coronavirus 2 by RT PCR: NEGATIVE

## 2024-09-29 LAB — TROPONIN T, HIGH SENSITIVITY
Troponin T High Sensitivity: <15 ng/L (ref 0–19)
Troponin T High Sensitivity: <15 ng/L (ref 0–19)

## 2024-09-29 LAB — PREGNANCY, URINE: Preg Test, Ur: NEGATIVE

## 2024-09-29 NOTE — ED Provider Notes (Signed)
 " [facility] Provider Note  CSN: [id number] Arrival date & time: 09/29/24 1031  Chief Complaint(s) Chest Pain  HPI Michaela Moran is a 44 y.o. female with past medical history as below, significant for acid reflux, c-section, ovarian cyst who presents to the ED with complaint of cp  Pt reports intermittently over last few days has been having some cp while at work. Lasts only for a few moments, pressure feeling, a/w some dyspnea. Resolves with rest. No syncope, no diaphoresis, no n/v. Currently asymptomatic. Reports this happened once or twice before years ago but not since then.   Past Medical History Past Medical History:  Diagnosis Date   Acid reflux    Female circumcision    Hemorrhoid    History of positive PPD    neg CXR   Hypercholesteremia    Previous cesarean delivery, antepartum condition or complication 04/28/2016   History of C/S x 2     S/P repeat low transverse C-section 06/27/2016   Patient Active Problem List   Diagnosis Date Noted   Complex cyst of left ovary 11/26/2022   Chronic pelvic pain in female 11/26/2022   Dysuria 09/20/2021   Intramural and submucous leiomyoma of uterus 06/06/2021   Chronic pain of right knee 06/26/2018   Pelvic adhesive disease 06/09/2016   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 Authorizing Provider  albuterol  (VENTOLIN  HFA) 108 (90 Base) MCG/ACT inhaler Inhale 1-2 puffs into the lungs every 6 (six) hours as needed for wheezing or shortness of breath. 03/18/24 09/18/24  Theotis Haze ORN, NP  atorvastatin  (LIPITOR) 20 MG tablet Take 1 tablet (20 mg total) by mouth daily. FOR CHOLESTEROL 09/25/24   Fleming, Zelda W, NP  Azelastine  HCl 137 MCG/SPRAY SOLN Place 2 sprays into both nostrils daily. 03/18/24   Newlin, Enobong, MD  cyclobenzaprine   (FLEXERIL ) 5 MG tablet Take 1 tablet (5 mg total) by mouth 3 (three) times daily as needed for muscle spasms. 03/18/24   Fleming, Zelda W, NP  fluticasone  (FLONASE ) 50 MCG/ACT nasal spray Place 2 sprays into both nostrils daily. 03/18/24   Newlin, Enobong, MD  gabapentin  (NEURONTIN ) 300 MG capsule Take 1 capsule (300 mg total) by mouth 3 (three) times daily. FOR PAIN 03/18/24   Fleming, Zelda W, NP  ibuprofen  (ADVIL ) 600 MG tablet Take 1 tablet (600 mg total) by mouth every 8 (eight) hours as needed for moderate pain (pain score 4-6). 09/18/24   Fleming, Zelda W, NP  loratadine  (CLARITIN ) 10 MG tablet Take 1 tablet (10 mg total) by mouth daily. 06/19/24   Fleming, Zelda W, NP  pantoprazole  (PROTONIX ) 40 MG tablet Take 1 tablet (40 mg total) by mouth daily. FOR ACID 03/18/24   Fleming, Zelda W, NP  tretinoin  (RETIN-A ) 0.025 % cream Apply topically at bedtime. 09/18/24   Fleming, Zelda W, NP  Vitamin D , Ergocalciferol , (DRISDOL ) 1.25 MG (50000 UNIT) CAPS capsule Take 1 capsule (50,000 Units total) by mouth every 7 (seven) days. 09/18/24   Fleming, Zelda W, NP  Past Surgical History Past Surgical History:  Procedure Laterality Date   ARTERY BIOPSY Right 02/17/2021   Procedure: RIGHT TEMPORAL ARTERY BIOPSY;  Surgeon: Eliza Lonni RAMAN, MD;  Location: [facility] OR;  Service: Vascular;  Laterality: Right;   CESAREAN SECTION     CESAREAN SECTION N/A 01/02/2013   Procedure: CESAREAN SECTION;  Surgeon: Glenys RAMAN Birk, MD;  Location: [facility] ORS;  Service: Obstetrics;  Laterality: N/A;  Incidental Cyystotomy   CESAREAN SECTION N/A 06/26/2016   Procedure: CESAREAN SECTION;  Surgeon: Bebe Furry, MD;  Location: [facility];  Service: Obstetrics;  Laterality: N/A;   CYSTOTOMY     with repair at 2014 repeat c-section   Family History Family History  Problem Relation Age of Onset   Diabetes  Mother    Diabetes Sister    Diabetes Brother    Hypertension Brother    Diabetes Sister    Diabetes Sister    Autism Son    Healthy Son    Healthy Daughter    Other Neg Hx     Social History Social History[1] Allergies Other  Review of Systems A thorough review of systems was obtained and all systems are negative except as noted in the HPI and PMH.   Physical Exam Vital Signs  I have reviewed the triage vital signs BP 120/88   Pulse 63   Temp 98.2 F (36.8 C)   Resp 14   LMP 09/22/2024   SpO2 99%  Physical Exam Vitals and nursing note reviewed.  Constitutional:      General: She is not in acute distress.    Appearance: Normal appearance. She is well-developed. She is obese.  HENT:     Head: Normocephalic and atraumatic.     Right Ear: External ear normal.     Left Ear: External ear normal.     Nose: Nose normal.     Mouth/Throat:     Mouth: Mucous membranes are moist.  Eyes:     General: No scleral icterus.       Right eye: No discharge.        Left eye: No discharge.  Cardiovascular:     Rate and Rhythm: Normal rate and regular rhythm.     Pulses: Normal pulses.     Heart sounds: Normal heart sounds.  Pulmonary:     Effort: Pulmonary effort is normal. No respiratory distress.     Breath sounds: Normal breath sounds. No stridor.  Abdominal:     General: Abdomen is flat. There is no distension.     Palpations: Abdomen is soft.     Tenderness: There is no abdominal tenderness.  Musculoskeletal:     Cervical back: No rigidity.     Right lower leg: No edema.     Left lower leg: No edema.  Skin:    General: Skin is warm and dry.     Capillary Refill: Capillary refill takes less than 2 seconds.  Neurological:     Mental Status: She is alert.  Psychiatric:        Mood and Affect: Mood normal.        Behavior: Behavior normal. Behavior is cooperative.     ED Results and Treatments Labs (all labs ordered are listed, but only abnormal results are  displayed) Labs Reviewed  RESP PANEL BY RT-PCR (RSV, FLU A&B, COVID)  RVPGX2  BASIC METABOLIC PANEL WITH GFR  CBC  PREGNANCY, URINE  TROPONIN T, HIGH SENSITIVITY  TROPONIN T, HIGH SENSITIVITY  Radiology DG Chest 2 View Result Date: 09/29/2024 CLINICAL DATA:  Chest pain. EXAM: CHEST - 2 VIEW COMPARISON:  05/20/2024 FINDINGS: The heart size and mediastinal contours are within normal limits. There is no evidence of pulmonary edema, consolidation, pneumothorax, nodule or pleural fluid. The visualized skeletal structures are unremarkable. IMPRESSION: No active cardiopulmonary disease. Electronically Signed   By: Marcey Moan M.D.   On: 09/29/2024 11:50    Pertinent labs & imaging results that were available during my care of the patient were reviewed by me and considered in my medical decision making (see MDM for details).  Medications Ordered in ED Medications - No data to display                                                                                                                                   Procedures Procedures  (including critical care time)  Medical Decision Making / ED Course    Medical Decision Making:    Michaela Moran is a 44 y.o. female with past medical history as below, significant for acid reflux, c-section, ovarian cyst who presents to the ED with complaint of cp. The complaint involves an extensive differential diagnosis and also carries with it a high risk of complications and morbidity.  Serious etiology was considered. Ddx includes but is not limited to: Differential includes all life-threatening causes for chest pain. This includes but is not exclusive to acute coronary syndrome, aortic dissection, pulmonary embolism, cardiac tamponade, community-acquired pneumonia, pericarditis, musculoskeletal chest wall pain, etc.   Complete initial  physical exam performed, notably the patient was in NAD, resting comfortably.    Reviewed and confirmed nursing documentation for past medical history, family history, social history.  Vital signs reviewed.    ***  Clinical Course as of 09/29/24 1530  Tue Sep 29, 2024  1529 Handoff to incoming EDP pending delta trop [SG]    Clinical Course User Index [SG] Elnor Jayson LABOR, DO     The patient's chest pain is not suggestive of pulmonary embolus, cardiac ischemia, aortic dissection, pericarditis, myocarditis, pulmonary embolism, pneumothorax, pneumonia, Zoster, or esophageal perforation, or other serious etiology.  Historically not abrupt in onset, tearing or ripping, pulses symmetric. EKG nonspecific for ischemia/infarction. No dysrhythmias, brugada, WPW, prolonged QT noted.   [CXR reviewed and WNL]   [Troponin negative x2. CXR reviewed. Labs without demonstration of acute pathology unless otherwise noted above. Low HEART Score: 0-3 points (0.9-1.7% risk of MACE).]   Given the extremely low risk of these diagnoses further testing and evaluation for these possibilities does not appear to be indicated at this time. Patient in no distress and overall condition improved here in the ED. Detailed discussions were had with the patient regarding current findings, and need for close f/u with PCP or on call doctor. The patient has been instructed to return immediately if the symptoms worsen in any way for re-evaluation. Patient verbalized understanding and is  in agreement with current care plan. All questions answered prior to discharge.                Additional history obtained: -Additional history obtained from na -External records from outside source obtained and reviewed including: Chart review including previous notes, labs, imaging, consultation notes including  Primary care documentation   Lab Tests: -I ordered, reviewed, and interpreted labs.   The pertinent results include:    Labs Reviewed  RESP PANEL BY RT-PCR (RSV, FLU A&B, COVID)  RVPGX2  BASIC METABOLIC PANEL WITH GFR  CBC  PREGNANCY, URINE  TROPONIN T, HIGH SENSITIVITY  TROPONIN T, HIGH SENSITIVITY    Notable for labs stable  EKG   EKG Interpretation Date/Time:  Tuesday September 29 2024 10:43:56 EST Ventricular Rate:  78 PR Interval:  150 QRS Duration:  88 QT Interval:  380 QTC Calculation: 433 R Axis:   46  Text Interpretation: Sinus rhythm Abnormal R-wave progression, early transition Borderline T abnormalities, inferior leads Similar to prior, no STEMI Confirmed by Elnor Savant ([id number]) on 09/29/2024 2:02:59 PM         Imaging Studies ordered: I ordered imaging studies including *** I independently visualized the following imaging with scope of interpretation limited to determining acute life threatening conditions related to emergency care; findings noted above I agree with the radiologist interpretation If any imaging was obtained with contrast I closely monitored patient for any possible adverse reaction a/w contrast administration in the emergency department   Medicines ordered and prescription drug management: No orders of the defined types were placed in this encounter.   -I have reviewed the patients home medicines and have made adjustments as needed   Consultations Obtained: I requested consultation with the ***,  and discussed lab and imaging findings as well as pertinent plan - they recommend: ***   Cardiac Monitoring: The patient was maintained on a cardiac monitor.  I personally viewed and interpreted the cardiac monitored which showed an underlying rhythm of: *** Continuous pulse oximetry interpreted by myself, ***% on ***.    Social Determinants of Health:  Diagnosis or treatment significantly limited by social determinants of health: {wssoc:28071}   Reevaluation: After the interventions noted above, I reevaluated the patient and found that they have  {resolved/improved/worsened:23923::improved}  Co morbidities that complicate the patient evaluation  Past Medical History:  Diagnosis Date   Acid reflux    Female circumcision    Hemorrhoid    History of positive PPD    neg CXR   Hypercholesteremia    Previous cesarean delivery, antepartum condition or complication 04/28/2016   History of C/S x 2     S/P repeat low transverse C-section 06/27/2016      Dispostion: Disposition decision including need for hospitalization was considered, and patient {wsdispo:28070::discharged from emergency department.}    Final Clinical Impression(s) / ED Diagnoses Final diagnoses:  Chest pain, unspecified type         [1]  Social History Tobacco Use   Smoking status: Never   Smokeless tobacco: Never  Vaping Use   Vaping status: Never Used  Substance Use Topics   Alcohol use: No   Drug use: No   "

## 2024-09-29 NOTE — Discharge Instructions (Signed)
 It was a pleasure caring for you today in the emergency department.  Return to the Emergency Department if you have unusual chest pain, pressure, or discomfort, shortness of breath, nausea, vomiting, burping, heartburn, tingling upper body parts, sweating, cold, clammy skin, or racing heartbeat. Call 911 if you think you are having a heart attack. Take all cardiac medications as prescribed - notify your doctor if you have any side effects. Follow cardiac diet - avoid fatty & fried foods, don't eat too much red meat, eat lots of fruits & vegetables, and dairy products should be low fat. Please lose weight if you are overweight. Become more active with walking, gardening, or any other activity that gets you to moving.   Please return to the emergency department immediately for any new or concerning symptoms, or if you get worse.

## 2024-09-29 NOTE — Telephone Encounter (Signed)
Call unanswered by patient.

## 2024-09-29 NOTE — ED Provider Notes (Signed)
 Blood pressure 120/88, pulse 63, temperature 98.2 F (36.8 C), resp. rate 14, last menstrual period 09/22/2024, SpO2 99%.  Assuming care from Dr. Elnor.  In short, Michaela Moran is a 44 y.o. female with a chief complaint of Chest Pain .  Refer to the original H&P for additional details.  The current plan of care is to f/u on repeat troponin and reassess.   Delta troponin negative. Stable for discharge.    Darra Fonda MATSU, MD 09/29/24 [phone]

## 2024-09-29 NOTE — Telephone Encounter (Signed)
 Copied from CRM [id number]. Topic: General - Other >> Sep 29, 2024 10:02 AM   [name] wrote:  Reason for CRM: Patient is requesting speak with a nurse in the office. Attempted to help but declined and asked for a nurse.  Patient can be reached at [phone]

## 2024-09-29 NOTE — ED Triage Notes (Signed)
 Mid chest pain since last night. L arm pain. Denies SHOB, N/V

## 2024-09-29 NOTE — Telephone Encounter (Signed)
 Return call unanswered by patient.

## 2024-10-06 ENCOUNTER — Ambulatory Visit
Admission: RE | Admit: 2024-10-06 | Discharge: 2024-10-06 | Disposition: A | Payer: Self-pay | Source: Ambulatory Visit | Attending: Nurse Practitioner

## 2024-10-06 DIAGNOSIS — Z1231 Encounter for screening mammogram for malignant neoplasm of breast: Secondary | ICD-10-CM

## 2024-10-14 DIAGNOSIS — N90810 Female genital mutilation status, unspecified: Secondary | ICD-10-CM | POA: Insufficient documentation

## 2024-10-14 DIAGNOSIS — K219 Gastro-esophageal reflux disease without esophagitis: Secondary | ICD-10-CM | POA: Insufficient documentation

## 2024-10-14 DIAGNOSIS — K649 Unspecified hemorrhoids: Secondary | ICD-10-CM | POA: Insufficient documentation

## 2024-10-14 DIAGNOSIS — E7800 Pure hypercholesterolemia, unspecified: Secondary | ICD-10-CM | POA: Insufficient documentation

## 2024-10-14 DIAGNOSIS — Z9289 Personal history of other medical treatment: Secondary | ICD-10-CM | POA: Insufficient documentation

## 2024-10-28 ENCOUNTER — Ambulatory Visit: Admitting: Cardiology

## 2025-03-22 ENCOUNTER — Ambulatory Visit: Payer: Self-pay | Admitting: Nurse Practitioner

## 2025-06-07 ENCOUNTER — Ambulatory Visit: Admitting: Physician Assistant
